# Patient Record
Sex: Female | Born: 1935
Health system: Southern US, Community
[De-identification: ages and names within clinical notes are randomized; demographics above are authoritative.]

## PROBLEM LIST (undated history)

## (undated) DIAGNOSIS — E785 Hyperlipidemia, unspecified: Secondary | ICD-10-CM

## (undated) DIAGNOSIS — T7840XA Allergy, unspecified, initial encounter: Secondary | ICD-10-CM

## (undated) DIAGNOSIS — Z9289 Personal history of other medical treatment: Secondary | ICD-10-CM

## (undated) DIAGNOSIS — Z9889 Other specified postprocedural states: Secondary | ICD-10-CM

## (undated) DIAGNOSIS — M1990 Unspecified osteoarthritis, unspecified site: Secondary | ICD-10-CM

## (undated) DIAGNOSIS — I499 Cardiac arrhythmia, unspecified: Secondary | ICD-10-CM

## (undated) DIAGNOSIS — K635 Polyp of colon: Secondary | ICD-10-CM

## (undated) DIAGNOSIS — R112 Nausea with vomiting, unspecified: Secondary | ICD-10-CM

## (undated) HISTORY — DX: Hyperlipidemia, unspecified: E78.5

## (undated) HISTORY — DX: Allergy, unspecified, initial encounter: T78.40XA

## (undated) HISTORY — PX: TUBAL LIGATION: SHX77

## (undated) HISTORY — DX: Cardiac arrhythmia, unspecified: I49.9

## (undated) HISTORY — PX: TOTAL KNEE ARTHROPLASTY: SHX125

## (undated) HISTORY — DX: Unspecified osteoarthritis, unspecified site: M19.90

## (undated) HISTORY — PX: CARDIAC CATHETERIZATION: SHX172

## (undated) HISTORY — PX: HAND SURGERY: SHX662

## (undated) HISTORY — DX: Polyp of colon: K63.5

---

## 1998-02-05 ENCOUNTER — Ambulatory Visit: Admission: RE | Admit: 1998-02-05 | Discharge: 1998-02-05 | Payer: Self-pay | Admitting: Family Medicine

## 1999-03-18 ENCOUNTER — Other Ambulatory Visit: Admission: RE | Admit: 1999-03-18 | Discharge: 1999-03-18 | Payer: Self-pay | Admitting: Obstetrics and Gynecology

## 1999-07-23 ENCOUNTER — Other Ambulatory Visit: Admission: RE | Admit: 1999-07-23 | Discharge: 1999-07-23 | Payer: Self-pay | Admitting: Internal Medicine

## 2000-07-27 ENCOUNTER — Other Ambulatory Visit: Admission: RE | Admit: 2000-07-27 | Discharge: 2000-07-27 | Payer: Self-pay | Admitting: Obstetrics and Gynecology

## 2001-02-09 ENCOUNTER — Ambulatory Visit (HOSPITAL_COMMUNITY): Admission: RE | Admit: 2001-02-09 | Discharge: 2001-02-09 | Payer: Self-pay | Admitting: Family Medicine

## 2001-02-09 ENCOUNTER — Encounter: Payer: Self-pay | Admitting: Family Medicine

## 2001-05-03 ENCOUNTER — Ambulatory Visit (HOSPITAL_COMMUNITY): Admission: RE | Admit: 2001-05-03 | Discharge: 2001-05-03 | Payer: Self-pay | Admitting: Cardiology

## 2001-08-09 ENCOUNTER — Other Ambulatory Visit: Admission: RE | Admit: 2001-08-09 | Discharge: 2001-08-09 | Payer: Self-pay | Admitting: Obstetrics and Gynecology

## 2002-08-29 ENCOUNTER — Other Ambulatory Visit: Admission: RE | Admit: 2002-08-29 | Discharge: 2002-08-29 | Payer: Self-pay | Admitting: Obstetrics and Gynecology

## 2005-05-19 ENCOUNTER — Other Ambulatory Visit: Admission: RE | Admit: 2005-05-19 | Discharge: 2005-05-19 | Payer: Self-pay | Admitting: Obstetrics and Gynecology

## 2005-09-27 HISTORY — PX: TOTAL HIP ARTHROPLASTY: SHX124

## 2005-09-29 ENCOUNTER — Encounter: Payer: Self-pay | Admitting: Family Medicine

## 2005-11-03 ENCOUNTER — Inpatient Hospital Stay (HOSPITAL_COMMUNITY): Admission: RE | Admit: 2005-11-03 | Discharge: 2005-11-06 | Payer: Self-pay | Admitting: Orthopedic Surgery

## 2006-12-02 ENCOUNTER — Emergency Department: Payer: Self-pay | Admitting: General Practice

## 2007-05-31 ENCOUNTER — Ambulatory Visit: Payer: Self-pay | Admitting: Family Medicine

## 2007-05-31 DIAGNOSIS — J309 Allergic rhinitis, unspecified: Secondary | ICD-10-CM | POA: Insufficient documentation

## 2007-05-31 DIAGNOSIS — M19049 Primary osteoarthritis, unspecified hand: Secondary | ICD-10-CM

## 2007-06-21 ENCOUNTER — Ambulatory Visit: Payer: Self-pay | Admitting: Family Medicine

## 2007-06-23 ENCOUNTER — Encounter (INDEPENDENT_AMBULATORY_CARE_PROVIDER_SITE_OTHER): Payer: Self-pay | Admitting: *Deleted

## 2007-06-23 LAB — CONVERTED CEMR LAB
ALT: 22 units/L (ref 0–35)
AST: 25 units/L (ref 0–37)
Albumin: 3.8 g/dL (ref 3.5–5.2)
Alkaline Phosphatase: 66 units/L (ref 39–117)
BUN: 19 mg/dL (ref 6–23)
Bilirubin, Direct: 0.1 mg/dL (ref 0.0–0.3)
CO2: 30 meq/L (ref 19–32)
Calcium: 9.4 mg/dL (ref 8.4–10.5)
Chloride: 107 meq/L (ref 96–112)
Cholesterol: 196 mg/dL (ref 0–200)
Creatinine, Ser: 0.8 mg/dL (ref 0.4–1.2)
GFR calc Af Amer: 90 mL/min
GFR calc non Af Amer: 75 mL/min
Glucose, Bld: 83 mg/dL (ref 70–99)
HDL: 66.7 mg/dL (ref >39.0)
LDL Cholesterol: 117 mg/dL — ABNORMAL HIGH (ref 0–99)
Potassium: 4.8 meq/L (ref 3.5–5.1)
Sodium: 141 meq/L (ref 135–145)
Total Bilirubin: 1 mg/dL (ref 0.3–1.2)
Total CHOL/HDL Ratio: 2.9
Total Protein: 7.1 g/dL (ref 6.0–8.3)
Triglycerides: 63 mg/dL (ref 0–149)
VLDL: 13 mg/dL (ref 0–40)

## 2007-06-28 DIAGNOSIS — M810 Age-related osteoporosis without current pathological fracture: Secondary | ICD-10-CM | POA: Insufficient documentation

## 2007-07-12 ENCOUNTER — Other Ambulatory Visit: Admission: RE | Admit: 2007-07-12 | Discharge: 2007-07-12 | Payer: Self-pay | Admitting: Obstetrics and Gynecology

## 2007-07-14 ENCOUNTER — Ambulatory Visit: Payer: Self-pay | Admitting: Family Medicine

## 2007-07-26 ENCOUNTER — Encounter: Payer: Self-pay | Admitting: Family Medicine

## 2007-08-04 ENCOUNTER — Encounter: Payer: Self-pay | Admitting: Family Medicine

## 2007-08-16 ENCOUNTER — Encounter: Payer: Self-pay | Admitting: Family Medicine

## 2007-08-16 ENCOUNTER — Ambulatory Visit: Payer: Self-pay | Admitting: Family Medicine

## 2007-11-24 ENCOUNTER — Ambulatory Visit: Payer: Self-pay | Admitting: Family Medicine

## 2007-12-21 ENCOUNTER — Ambulatory Visit: Payer: Self-pay | Admitting: Family Medicine

## 2007-12-21 ENCOUNTER — Telehealth: Payer: Self-pay | Admitting: Family Medicine

## 2008-06-18 ENCOUNTER — Ambulatory Visit: Payer: Self-pay | Admitting: Family Medicine

## 2008-06-20 ENCOUNTER — Encounter (INDEPENDENT_AMBULATORY_CARE_PROVIDER_SITE_OTHER): Payer: Self-pay | Admitting: *Deleted

## 2008-06-21 LAB — CONVERTED CEMR LAB
ALT: 18 units/L (ref 0–35)
AST: 23 units/L (ref 0–37)
Albumin: 3.8 g/dL (ref 3.5–5.2)
Alkaline Phosphatase: 54 units/L (ref 39–117)
BUN: 17 mg/dL (ref 6–23)
Bilirubin, Direct: 0.1 mg/dL (ref 0.0–0.3)
CO2: 27 meq/L (ref 19–32)
Calcium: 9.6 mg/dL (ref 8.4–10.5)
Chloride: 111 meq/L (ref 96–112)
Cholesterol: 221 mg/dL (ref 0–200)
Creatinine, Ser: 0.7 mg/dL (ref 0.4–1.2)
Direct LDL: 144.4 mg/dL
GFR calc Af Amer: 105 mL/min
GFR calc non Af Amer: 87 mL/min
Glucose, Bld: 84 mg/dL (ref 70–99)
HDL: 76.1 mg/dL (ref >39.0)
Potassium: 5.3 meq/L — ABNORMAL HIGH (ref 3.5–5.1)
Sodium: 141 meq/L (ref 135–145)
Total Bilirubin: 1 mg/dL (ref 0.3–1.2)
Total CHOL/HDL Ratio: 2.9
Total Protein: 7 g/dL (ref 6.0–8.3)
Triglycerides: 60 mg/dL (ref 0–149)
VLDL: 12 mg/dL (ref 0–40)

## 2008-07-11 ENCOUNTER — Ambulatory Visit: Payer: Self-pay | Admitting: Family Medicine

## 2008-07-11 DIAGNOSIS — N390 Urinary tract infection, site not specified: Secondary | ICD-10-CM | POA: Insufficient documentation

## 2008-07-11 LAB — CONVERTED CEMR LAB
Nitrite: NEGATIVE
Protein, U semiquant: NEGATIVE
Urobilinogen, UA: NEGATIVE
pH: 6

## 2008-07-26 ENCOUNTER — Ambulatory Visit: Payer: Self-pay | Admitting: Family Medicine

## 2008-07-26 LAB — CONVERTED CEMR LAB
Bilirubin Urine: NEGATIVE
Glucose, Urine, Semiquant: NEGATIVE
pH: 6.5

## 2008-07-27 ENCOUNTER — Encounter: Payer: Self-pay | Admitting: Family Medicine

## 2008-08-01 ENCOUNTER — Ambulatory Visit: Payer: Self-pay | Admitting: Internal Medicine

## 2008-09-12 ENCOUNTER — Ambulatory Visit: Payer: Self-pay | Admitting: Internal Medicine

## 2008-09-25 ENCOUNTER — Ambulatory Visit: Payer: Self-pay | Admitting: Family Medicine

## 2008-09-25 DIAGNOSIS — R252 Cramp and spasm: Secondary | ICD-10-CM | POA: Insufficient documentation

## 2008-09-26 DIAGNOSIS — E538 Deficiency of other specified B group vitamins: Secondary | ICD-10-CM

## 2008-09-26 LAB — CONVERTED CEMR LAB
ALT: 29 units/L (ref 0–35)
Albumin: 3.6 g/dL (ref 3.5–5.2)
BUN: 17 mg/dL (ref 6–23)
CO2: 28 meq/L (ref 19–32)
Calcium: 9.2 mg/dL (ref 8.4–10.5)
Creatinine, Ser: 0.7 mg/dL (ref 0.4–1.2)
Direct LDL: 116.9 mg/dL
Ferritin: 32.8 ng/mL (ref 10.0–291.0)
Folate: 8.6 ng/mL
GFR calc non Af Amer: 87 mL/min
HDL: 73.7 mg/dL (ref >39.0)
Total Bilirubin: 0.8 mg/dL (ref 0.3–1.2)
VLDL: 12 mg/dL (ref 0–40)
Vitamin B-12: 177 pg/mL — ABNORMAL LOW (ref 211–911)

## 2008-10-03 ENCOUNTER — Ambulatory Visit: Payer: Self-pay | Admitting: Internal Medicine

## 2008-10-03 ENCOUNTER — Encounter: Payer: Self-pay | Admitting: Internal Medicine

## 2008-10-04 ENCOUNTER — Encounter: Payer: Self-pay | Admitting: Internal Medicine

## 2008-11-12 ENCOUNTER — Encounter: Payer: Self-pay | Admitting: Family Medicine

## 2008-12-12 ENCOUNTER — Ambulatory Visit: Payer: Self-pay | Admitting: Family Medicine

## 2008-12-12 LAB — CONVERTED CEMR LAB
Bilirubin Urine: NEGATIVE
Protein, U semiquant: NEGATIVE
RBC / HPF: 0
Specific Gravity, Urine: 1.01

## 2009-01-20 ENCOUNTER — Encounter: Payer: Self-pay | Admitting: Family Medicine

## 2009-01-30 ENCOUNTER — Other Ambulatory Visit: Admission: RE | Admit: 2009-01-30 | Discharge: 2009-01-30 | Payer: Self-pay | Admitting: Obstetrics and Gynecology

## 2009-01-30 ENCOUNTER — Encounter: Payer: Self-pay | Admitting: Obstetrics and Gynecology

## 2009-01-30 ENCOUNTER — Ambulatory Visit: Payer: Self-pay | Admitting: Obstetrics and Gynecology

## 2009-03-13 ENCOUNTER — Ambulatory Visit: Payer: Self-pay | Admitting: Family Medicine

## 2009-03-13 LAB — CONVERTED CEMR LAB
Bacteria, UA: 0
Epithelial cells, urine: 0 /lpf
Ketones, urine, test strip: NEGATIVE
Nitrite: NEGATIVE
Specific Gravity, Urine: <1.005
Urobilinogen, UA: 0.2

## 2009-03-14 ENCOUNTER — Encounter (INDEPENDENT_AMBULATORY_CARE_PROVIDER_SITE_OTHER): Payer: Self-pay | Admitting: Internal Medicine

## 2009-08-05 ENCOUNTER — Ambulatory Visit: Payer: Self-pay | Admitting: Family Medicine

## 2009-12-09 ENCOUNTER — Encounter: Payer: Self-pay | Admitting: Family Medicine

## 2009-12-10 ENCOUNTER — Telehealth: Payer: Self-pay | Admitting: Family Medicine

## 2009-12-26 ENCOUNTER — Ambulatory Visit: Payer: Self-pay | Admitting: Family Medicine

## 2009-12-26 LAB — CONVERTED CEMR LAB
ALT: 24 units/L (ref 0–35)
AST: 31 units/L (ref 0–37)
Alkaline Phosphatase: 54 units/L (ref 39–117)
BUN: 18 mg/dL (ref 6–23)
Basophils Absolute: 0 10*3/uL (ref 0.0–0.1)
CO2: 31 meq/L (ref 19–32)
Calcium: 9.3 mg/dL (ref 8.4–10.5)
Eosinophils Absolute: 0.2 10*3/uL (ref 0.0–0.7)
Eosinophils Relative: 3.1 % (ref 0.0–5.0)
GFR calc non Af Amer: 74.04 mL/min (ref >60)
Glucose, Bld: 92 mg/dL (ref 70–99)
LDL Cholesterol: 85 mg/dL (ref 0–99)
MCHC: 34.6 g/dL (ref 30.0–36.0)
MCV: 98.4 fL (ref 78.0–100.0)
Monocytes Absolute: 0.5 10*3/uL (ref 0.1–1.0)
Neutrophils Relative %: 75.1 % (ref 43.0–77.0)
Nitrite: NEGATIVE
Platelets: 249 10*3/uL (ref 150.0–400.0)
RDW: 14.1 % (ref 11.5–14.6)
Sodium: 143 meq/L (ref 135–145)
Total CHOL/HDL Ratio: 2
Triglycerides: 59 mg/dL (ref 0.0–149.0)
Urobilinogen, UA: 0.2
WBC: 5.9 10*3/uL (ref 4.5–10.5)

## 2009-12-27 ENCOUNTER — Encounter: Payer: Self-pay | Admitting: Family Medicine

## 2009-12-28 LAB — CONVERTED CEMR LAB: Vit D, 25-Hydroxy: 84 ng/mL (ref 30–89)

## 2010-01-06 ENCOUNTER — Ambulatory Visit: Payer: Self-pay | Admitting: Family Medicine

## 2010-02-10 ENCOUNTER — Encounter: Payer: Self-pay | Admitting: Family Medicine

## 2010-02-24 ENCOUNTER — Encounter (INDEPENDENT_AMBULATORY_CARE_PROVIDER_SITE_OTHER): Payer: Self-pay | Admitting: *Deleted

## 2010-02-27 ENCOUNTER — Encounter: Payer: Self-pay | Admitting: Family Medicine

## 2010-03-12 ENCOUNTER — Ambulatory Visit: Payer: Self-pay | Admitting: Family Medicine

## 2010-03-12 DIAGNOSIS — L02219 Cutaneous abscess of trunk, unspecified: Secondary | ICD-10-CM

## 2010-03-12 DIAGNOSIS — L03319 Cellulitis of trunk, unspecified: Secondary | ICD-10-CM

## 2010-06-23 ENCOUNTER — Ambulatory Visit: Payer: Self-pay | Admitting: Family Medicine

## 2010-07-07 ENCOUNTER — Ambulatory Visit: Payer: Self-pay | Admitting: Obstetrics and Gynecology

## 2010-10-27 NOTE — Assessment & Plan Note (Signed)
Summary: CPX PER DR. Daphine Deutscher CALL TO PATIENT / LFW   Vital Signs:  Patient profile:   77 year old female Height:      67.25 inches Weight:      137.25 pounds BMI:     21.41 Temp:     97.9 degrees F oral Pulse rate:   64 / minute Pulse rhythm:   regular BP sitting:   110 / 60  (left arm) Cuff size:   regular  Vitals Entered By: Linde Gillis CMA Duncan Dull) (January 06, 2010 8:55 AM) CC: 30 minute exam   History of Present Illness: UTI resolved with antibitoics.   Sees GYN for CPX, breast and pelvic exam.  HRT well controlled on prempro. Encouraged her to discuss weaning off given CAD risk.   Osteopenia.Marland Kitchenvit D nml. On calcium and vit daily. Has forgotten Boniva regularly.   Osteoarthrits.  Well  controlled with diclofenac as needed.  Problems Prior to Update: 1)  Uti  (ICD-599.0) 2)  Other Malaise and Fatigue  (ICD-780.79) 3)  Vitamin B12 Deficiency  (ICD-266.2) 4)  Leg Cramps, Nocturnal  (ICD-729.82) 5)  Other Screening Mammogram  (ICD-V76.12) 6)  Microscopic Hematuria  (ICD-599.72) 7)  Urinary Tract Infection, Recurrent  (ICD-599.0) 8)  Osteopenia  (ICD-733.90) 9)  Allergic Rhinitis  (ICD-477.9) 10)  Family History of Cad Female 1st Degree Relative <50  (ICD-V17.3) 11)  Osteoarthritis  (ICD-715.90) 12)  Hyperlipidemia  (ICD-272.4)  Current Medications (verified): 1)  Prempro 0.45-1.5 Mg Tabs (Conj Estrog-Medroxyprogest Ace) .... Take One Tablet By Mouth Two Time A Week 2)  Simvastatin 40 Mg  Tabs (Simvastatin) .... Take One By Mouth Every Morning 3)  Diclofenac Sodium 75 Mg  Tbec (Diclofenac Sodium) .... Take One By Mouth Two Times A Day As Needed 4)  Fexofenadine Hcl 180 Mg  Tabs (Fexofenadine Hcl) .... Take One By Mouth Once A Day 5)  Calcium 600 Mg With Vitamin D .... Take 2 By Mouth Once A Day 6)  Boniva 150 Mg  Tabs (Ibandronate Sodium) .Marland Kitchen.. 1 Tab By Mouth Monthly  Allergies (verified): No Known Drug Allergies  Past History:  Past medical, surgical, family and  social histories (including risk factors) reviewed, and no changes noted (except as noted below).  Past Medical History: Reviewed history from 05/31/2007 and no changes required. Hyperlipidemia Osteoarthritis Allergic rhinitis  Past Surgical History: Reviewed history from 06/28/2007 and no changes required. cardiac cath 8/02 nml  Family History: Reviewed history from 05/31/2007 and no changes required. father died 24 old age mother died 65 pancreatic cancer 2 brothers MI age 75, CABG Family History of CAD Female 1st degree relative <50 sister breast, bone cancer  Social History: Reviewed history from 05/31/2007 and no changes required. 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 CV:  Denies chest pain or discomfort. Resp:  Denies shortness of breath. GI:  Denies abdominal pain, bloody stools, constipation, and diarrhea. GU:  Denies abnormal vaginal bleeding and dysuria. Derm:  Denies rash. Psych:  Denies anxiety and depression.  Physical Exam  General:  Thin elderly female in NAd Eyes:  No corneal or conjunctival inflammation noted. EOMI. Perrla.  Vision grossly normal. Ears:  External ear exam shows no significant lesions or deformities.  Otoscopic examination reveals clear canals, tympanic membranes are intact bilaterally without bulging, retraction,  inflammation or discharge. Hearing is grossly normal bilaterally. Nose:  External nasal examination shows no deformity or inflammation. Nasal mucosa are pink and moist without lesions or exudates. Mouth:  MMM Neck:  no carotid bruit or thyromegaly no cervical or supraclavicular lymphadenopathy  Breasts:  per GYN Lungs:  Normal respiratory effort, chest expands symmetrically. Lungs are clear to auscultation, no  crackles or wheezes. Heart:  Normal rate and regular rhythm. S1 and S2 normal without gallop, murmur, click, rub or other extra sounds. Abdomen:  Bowel sounds positive,abdomen soft and non-tender without masses, organomegaly or hernias noted. Genitalia:  per GYN Msk:  No deformity or scoliosis noted of thoracic or lumbar spine.   Pulses:  R and L posterior tibial pulses are full and equal bilaterally  Extremities:  no edema  Neurologic:  No cranial nerve deficits noted. Station and gait are normal.  Sensory, motor and coordinative functions appear intact. Skin:  Multiple SKs, no supicious lesions Psych:  Cognition and judgment appear intact. Alert and cooperative with normal attention span and concentration. No apparent delusions, illusions, hallucinations   Impression & Recommendations:  Problem # 1:  VITAMIN B12 DEFICIENCY (ICD-266.2) Resolved.   Problem # 2:  LEG CRAMPS, NOCTURNAL (ICD-729.82) Improved...use flexeril and diclofenac as needed. No clear symptoms of restless leg.   Problem # 3:  OSTEOPENIA (ICD-733.90) Due for DXA. Vit in nml range. Continue ca and vit D supplement. Encouraged her to take Boniva regularly.  Her updated medication list for this problem includes:    Boniva 150 Mg Tabs (Ibandronate sodium) .Marland Kitchen... 1 tab by mouth monthly  Orders: Radiology Referral (Radiology)  Problem # 4:  HYPERLIPIDEMIA (ICD-272.4)  Well controlled. Continue current medication.  Her updated medication list for this problem includes:    Simvastatin 40 Mg Tabs (Simvastatin) .Marland Kitchen... Take one by mouth every morning  Labs Reviewed: SGOT: 31 (12/26/2009)   SGPT: 24 (12/26/2009)   HDL:64.60 (12/26/2009), 73.7 (09/25/2008)  LDL:85 (12/26/2009), DEL (16/06/9603)  Chol:161 (12/26/2009), 205 (09/25/2008)  Trig:59.0 (12/26/2009), 61 (09/25/2008)  Complete Medication List: 1)  Prempro 0.45-1.5 Mg Tabs (Conj estrog-medroxyprogest ace) .... Take one tablet by mouth two time a week 2)  Simvastatin  40 Mg Tabs (Simvastatin) .... Take one by mouth every morning 3)  Diclofenac Sodium 75 Mg Tbec (Diclofenac sodium) .... Take one by mouth two times a day as needed 4)  Fexofenadine Hcl 180 Mg Tabs (Fexofenadine hcl) .... Take one by mouth once a day 5)  Calcium 600 Mg With Vitamin D  .... Take 2 by mouth once a day 6)  Boniva 150 Mg Tabs (Ibandronate sodium) .Marland Kitchen.. 1 tab by mouth monthly  Patient Instructions: 1)  Referral Appointment Information 2)  Day/Date: 3)  Time: 4)  Place/MD: 5)  Address: 6)  Phone/Fax: 7)  Patient given appointment information. Information/Orders faxed/mailed.  8)  Please schedule a follow-up appointment in 1 year.  Prescriptions: BONIVA 150 MG  TABS (IBANDRONATE SODIUM) 1 tab by mouth monthly  #3 x 3   Entered and Authorized by:   Kerby Nora MD   Signed by:   Kerby Nora MD on 01/06/2010   Method used:   Electronically to        [id number]       [address]       [place], [place]  [address]       Ph: [phone]       Fax: [phone]   RxID:   [id number]   Current  Allergies (reviewed today): No known allergies   Last Flu Vaccine:  Fluvax 3+ (08/05/2009 1:55:13 PM) Flu Vaccine Next Due:  1 yr Flex Sig Next Due:  Not Indicated Hemoccult Next Due:  Not Indicated GYN Dr. Dortha Schwalbe..has upcoming appt for DVE

## 2010-10-27 NOTE — Assessment & Plan Note (Signed)
Summary: flu shot/bedsole/dlo  Nurse Visit   Allergies: No Known Drug Allergies  Immunizations Administered:  Influenza Vaccine # 1:    Vaccine Type: Fluvax MCR    Site: left deltoid    Mfr: GlaxoSmithKline    Dose: 0.5 ml    Route: IM    Given by: Mervin Hack CMA (AAMA)    Exp. Date: 03/27/2011    Lot #: [id number]    VIS given: 04/21/10 version given June 23, 2010.  Flu Vaccine Consent Questions:    Do you have a history of severe allergic reactions to this vaccine? no    Any prior history of allergic reactions to egg and/or gelatin? no    Do you have a sensitivity to the preservative Thimersol? no    Do you have a past history of Guillan-Barre Syndrome? no    Do you currently have an acute febrile illness? no    Have you ever had a severe reaction to latex? no    Vaccine information given and explained to patient? yes    Are you currently pregnant? no  Orders Added: 1)  Influenza Vaccine MCR [00025]

## 2010-10-27 NOTE — Letter (Signed)
Summary: Results Follow up Letter  [place] [facility]  [address] [place], [place] [address]   Phone: [phone]  Fax: [phone]    02/24/2010 MRN: [id number]     Kathryn [name] [address] [place], [place]  [address]    Dear [name],  The following are the results of your recent test(s):  Test         Result    Pap Smear:        Normal _____  Not Normal _____ Comments: ______________________________________________________ Cholesterol: LDL(Bad cholesterol):         Your goal is less than:         HDL (Good cholesterol):       Your goal is more than: Comments:  ______________________________________________________ Mammogram:        Normal _____  Not Normal _____ Comments:  ___________________________________________________________________ Hemoccult:        Normal _____  Not normal _______ Comments:    _____________________________________________________________________ Other Tests:BONE DENSITY TEST:Notify pt that bone density is stable on biniva, no worsening. Recheck in 2 years    We routinely do not discuss normal results over the telephone.  If you desire a copy of the results, or you have any questions about this information we can discuss them at your next office visit.   Sincerely,   Kerby Nora MD

## 2010-10-27 NOTE — Letter (Signed)
Summary: Handicapped Placard/[facility]  Handicapped Placard/[facility]   Imported By: Lanelle Bal 03/05/2010 10:06:53  _____________________________________________________________________  External Attachment:    Type:   Image     Comment:   External Document

## 2010-10-27 NOTE — Progress Notes (Signed)
Summary: wants order for bone density, wants labs  Phone Note Call from Patient Call back at Home Phone [phone] Call back at Work Phone [phone]   Caller: Patient Call For: Kerby Nora MD Summary of Call: Pt got a reminder from [facility] that she is past due for a bone density test- she is requesting an order.  Also, she is asking if she can have lipids checked, last checked in 12/09.  Please advise. Initial call taken by: Lowella Petties CMA,  December 10, 2009 11:03 AM  Follow-up for Phone Call        Overdue for CPX.Marland Kitchenplease schedule with fasting lipids prior Dx 272.0 CMET, lipids, vit b12, vit D, TSH, cbc Dx 780.79  Will schedule mammogram and other procedures as needed at CPX appt.  Follow-up by: Kerby Nora MD,  December 10, 2009 1:57 PM  Additional Follow-up for Phone Call Additional follow up Details #1::        Spoke to patient and she says that she will have to check her calander at work and call back and schedule labs and physical Additional Follow-up by: Benny Lennert CMA Duncan Dull),  December 11, 2009 8:33 AM

## 2010-10-27 NOTE — Assessment & Plan Note (Signed)
Summary: CHECK PLACE ON STOMACH/CLE   Vital Signs:  Patient profile:   77 year old female Height:      67.25 inches Weight:      137.4 pounds BMI:     21.44 Temp:     97.9 degrees F oral Pulse rate:   64 / minute Pulse rhythm:   regular BP sitting:   102 / 78  (left arm) Cuff size:   regular  Vitals Entered By: Benny Lennert CMA Duncan Dull) (March 12, 2010 3:23 PM)  History of Present Illness: Chief complaint check place on stomach  Redness and soreness on stomach in last few weeks. Right lateral to umbilicus.  Applying neosprin ointment  Has seen yellowish discharge...yesterday firm white nodule came out..now less tender but small hole remians.   In past 5-6 years had cellulitis in same area and has always had firm nodule there since.   Allergies (verified): No Known Drug Allergies  Past History:  Past medical, surgical, family and social histories (including risk factors) reviewed, and no changes noted (except as noted below).  Past Medical History: Reviewed history from 05/31/2007 and no changes required. Hyperlipidemia Osteoarthritis Allergic rhinitis  Past Surgical History: Reviewed history from 06/28/2007 and no changes required. cardiac cath 8/02 nml  Family History: Reviewed history from 05/31/2007 and no changes required. father died 16 old age mother died 48 pancreatic cancer 2 brothers MI age 64, CABG Family History of CAD Female 1st degree relative <50 sister breast, bone cancer  Social History: Reviewed history from 05/31/2007 and no changes required. 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 CV:  Denies chest pain or discomfort. Resp:  Denies shortness of breath.  Physical Exam  General:  Well-developed,well-nourished,in no  acute distress; alert,appropriate and cooperative throughout examination Mouth:  Oral mucosa and oropharynx without lesions or exudates.  Teeth in good repair. Neck:  no carotid bruit or thyromegaly  no cervical or supraclavicular lymphadenopathy  Lungs:  Normal respiratory effort, chest expands symmetrically. Lungs are clear to auscultation, no crackles or wheezes. Heart:  Normal rate and regular rhythm. S1 and S2 normal without gallop, murmur, click, rub or other extra sounds. Abdomen:  Bowel sounds positive,abdomen soft and non-tender without masses, organomegaly or hernias noted. Skin:  0.3 cm hole to right lateral of umbilicus..with 2 cm surrounding erythema, yellowwhite discharge, no fluctuance.  probed with Q-tip .Marland Kitchennofistula   Impression & Recommendations:  Problem # 1:  CELLULITIS, ABDOMEN (ICD-682.2)  Pus and possible cyst discharged...treat with oral antibiotics. Low risk for MRSA.Marland Kitchenstarted before pts husband in hospital.  Warm soapy water..wash daily.  Wear bandaid with antiobiotic gel.  Her updated medication list for this problem includes:    Keflex 500 Mg Caps (Cephalexin) .Marland Kitchen... 1 tab by mouth three times a day x 7 days  . To be seen in 48-72 hours if no improvement, sooner if worse.  Complete Medication List: 1)  Prempro 0.45-1.5 Mg Tabs (Conj estrog-medroxyprogest ace) .... Take one tablet by mouth two time a week 2)  Simvastatin 40 Mg Tabs (Simvastatin) .... Take one by mouth every morning 3)  Diclofenac Sodium 75 Mg Tbec (Diclofenac sodium) .... Take one by mouth two times a day as needed 4)  Fexofenadine Hcl 180 Mg Tabs (Fexofenadine hcl) .... Take one by mouth once a day  5)  Calcium 600 Mg With Vitamin D  .... Take 2 by mouth once a day 6)  Boniva 150 Mg Tabs (Ibandronate sodium) .Marland Kitchen.. 1 tab by mouth monthly 7)  Keflex 500 Mg Caps (Cephalexin) .Marland Kitchen.. 1 tab by mouth three times a day x 7 days  Patient Instructions: 1)  Warm soapy water..wash daily. 2)   Wear bandaid  with antiobiotic gel. 3)  Start antibiotics.  4)  Stop peroxide.  5)  Call if redness spreading or fever or not resolving.  Prescriptions: KEFLEX 500 MG CAPS (CEPHALEXIN) 1 tab by mouth three times a day x 7 days  #21 x 0   Entered and Authorized by:   Kerby Nora MD   Signed by:   Kerby Nora MD on 03/12/2010   Method used:   [facility] to        [id number]       [address]       [place], [place]  [address]       Ph: [phone]       Fax: [phone]   RxID:   [id number]   Current Allergies (reviewed today): No known allergies

## 2010-10-27 NOTE — Assessment & Plan Note (Signed)
Summary: ?UTI/CLE   Vital Signs:  Patient profile:   77 year old female Height:      67.25 inches Weight:      137.38 pounds BMI:     21.43 Temp:     98.0 degrees F oral Pulse rate:   64 / minute Pulse rhythm:   regular BP sitting:   112 / 78  (left arm) Cuff size:   regular  Vitals Entered By: Linde Gillis CMA Duncan Dull) (December 26, 2009 10:40 AM)   Acute Visit History:      The patient complains of genitourinary symptoms.  These symptoms began 4 days ago.  She notes a history of dysuria, frequency, and hesitancy.  She denies abdominal pain, diarrhea, fever, flank pain, hematuria, incontinence, nausea, perineal itch, or perineal lesions.  The patient has no history of pyelonephritis, kidney stones, a renal anomaly, renal disease, or diabetes.        Allergies (verified): No Known Drug Allergies  Past History:  Past medical, surgical, family and social histories (including risk factors) reviewed, and no changes noted (except as noted below).  Past Medical History: Reviewed history from 05/31/2007 and no changes required. Hyperlipidemia Osteoarthritis Allergic rhinitis  Past Surgical History: Reviewed history from 06/28/2007 and no changes required. cardiac cath 8/02 nml  Family History: Reviewed history from 05/31/2007 and no changes required. father died 33 old age mother died 85 pancreatic cancer 2 brothers MI age 17, CABG Family History of CAD Female 1st degree relative <50 sister breast, bone cancer  Social History: Reviewed history from 05/31/2007 and no changes required. 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 Lungs are clear to auscultation, no crackles or wheezes. Heart:  Normal rate and regular rhythm. S1 and S2 normal without gallop, murmur, click, rub or other extra sounds. Abdomen:  Bowel sounds positive,abdomen soft and non-tender without masses, organomegaly or hernias noted. No CVA tenderness.    Impression & Recommendations:  Problem # 1:  UTI (ICD-599.0)  Given 3rd UTI in last 9 months..will send culture for resistance. No s/s of pyelonephritis.  Increase water, cranberry. Call if not imrpoving as expected.  The following medications were removed from the medication list:    Macrobid 100 Mg Caps (Nitrofurantoin monohyd macro) .Marland Kitchen... Take 1 two times a day for 7days Her updated medication list for this problem includes:    Cipro 250 Mg Tabs (Ciprofloxacin hcl) .Marland Kitchen... Take one (1) by mouth twice a day  Orders: UA Dipstick W/ Micro (manual) (04540) T-Culture, Urine ([id number])  Complete Medication List: 1)  Prempro 0.625-2.5 Mg Tabs (Conj estrog-medroxyprogest ace) .... Take one by mouth two times a week 2)  Simvastatin 40 Mg Tabs (Simvastatin) .... Take one by mouth every morning 3)  Diclofenac Sodium 75 Mg Tbec (Diclofenac sodium) .... Take one by mouth two times a day as needed 4)  Fexofenadine Hcl 180 Mg Tabs (Fexofenadine hcl) .... Take one by mouth once a day 5)  Calcium 600 Mg With Vitamin D  .... Take 2 by mouth once a day 6)  Boniva 150 Mg Tabs (Ibandronate sodium) .Marland Kitchen.. 1 tab by mouth monthly 7)  Flexeril 5 Mg Tabs (Cyclobenzaprine hcl) .Marland Kitchen.. 1 tab by mouth  at bedtime as needed leg cramps 8)  Cipro 250 Mg Tabs (Ciprofloxacin hcl) .... Take one (1) by mouth twice a day  Patient Instructions: 1)  Drink plenty of fluids up to 3-4 quarts a day. Cranberry juice is especially recommended in addition to large amounts of water. Avoid caffeine & carbonated drinks, they tend to irritate the bladder, Return in 3-5 days if you're not better: sooner if you're  feeling worse.  Prescriptions: CIPRO 250 MG TABS (CIPROFLOXACIN HCL) Take one (1) by mouth twice a day  #14 x 0   Entered and Authorized by:   Kerby Nora MD   Signed by:   Kerby Nora MD on 12/26/2009   Method used:   Electronically to        [id number]       [address]       [place], [place]  [address]       Ph: [phone]       Fax: [phone]   RxID:   [id number]   Current Allergies (reviewed today): No known allergies      Laboratory Results   Urine Tests  Date/Time Received: December 26, 2009 11:16 AM   Routine Urinalysis   Color: lt. yellow Appearance: Cloudy Glucose: negative   (Normal Range: Negative) Bilirubin: negative   (Normal Range: Negative) Ketone: negative   (Normal Range: Negative) Spec. Gravity: <1.005   (Normal Range: 1.003-1.035) Blood: moderate   (Normal Range: Negative) pH: 5.0   (Normal Range: 5.0-8.0) Protein: trace   (Normal Range: Negative) Urobilinogen: 0.2   (Normal Range: 0-1) Nitrite: negative   (Normal Range: Negative) Leukocyte Esterace: moderate   (Normal Range: Negative)

## 2011-01-04 ENCOUNTER — Other Ambulatory Visit: Payer: Self-pay | Admitting: Family Medicine

## 2011-01-19 ENCOUNTER — Ambulatory Visit: Payer: Self-pay | Admitting: Family Medicine

## 2011-01-28 ENCOUNTER — Encounter: Payer: Self-pay | Admitting: Family Medicine

## 2011-02-02 ENCOUNTER — Ambulatory Visit (INDEPENDENT_AMBULATORY_CARE_PROVIDER_SITE_OTHER): Payer: Medicare Other | Admitting: Family Medicine

## 2011-02-02 ENCOUNTER — Encounter: Payer: Self-pay | Admitting: Family Medicine

## 2011-02-02 DIAGNOSIS — Z01818 Encounter for other preprocedural examination: Secondary | ICD-10-CM | POA: Insufficient documentation

## 2011-02-02 NOTE — Assessment & Plan Note (Signed)
Moderate risk surgery in low risk individual. She should tolerate the total knee replacement well.  Needs labs and EKG prior to surgery, no other testing indicated.  If issues in hospital .. Call [facility] for inpatient coverage.

## 2011-02-02 NOTE — Progress Notes (Signed)
  Subjective:    Patient ID: Kathryn [name], female    DOB: [birth year], 77 y.o.   MRN: [id number]  HPI  77 year old female here for pre op eval prior to right knee replacement. Dr. Berton Lan is the surgeon. Scheduled for 03/01/2011.  She is to have preop eval with anethesiology at hospital prior including labs, EKG. She will be intubated for the surgery. Recovery time is 3 months.  She is feeling well overall.. Only ongoing issue is right knee pain.  Sees GYN for CPX, breast and pelvic exam. HRT well controlled on prempro.  DXA due next year. COlonoscopy up to date.  Mammogram normla on 01/28/2011  Vaccines up to date.      Review of Systems  Constitutional: Negative for fever and fatigue.  HENT: Negative for ear pain, congestion, rhinorrhea, sneezing and postnasal drip.   Eyes: Negative for pain.  Respiratory: Negative for shortness of breath and wheezing.   Cardiovascular: Negative for chest pain, palpitations and leg swelling.  Gastrointestinal: Negative for diarrhea, constipation, blood in stool and abdominal distention.  Genitourinary: Negative for dysuria and hematuria.  Musculoskeletal: Positive for arthralgias.  Neurological: Negative for syncope.       No recent falls or passing out.       Objective:   Physical Exam  Constitutional: Vital signs are normal. She appears well-developed and well-nourished. She is cooperative.  Non-toxic appearance. She does not appear ill. No distress.  HENT:  Head: Normocephalic.  Right Ear: Hearing, tympanic membrane, external ear and ear canal normal.  Left Ear: Hearing, tympanic membrane, external ear and ear canal normal.  Nose: Nose normal.  Eyes: Conjunctivae, EOM and lids are normal. Pupils are equal, round, and reactive to light. No foreign bodies found.  Neck: Trachea normal and normal range of motion. Neck supple. Carotid bruit is not present. No mass and no thyromegaly present.  Cardiovascular: Normal rate, regular rhythm, S1  normal, S2 normal, normal heart sounds and intact distal pulses.  Exam reveals no gallop.   No murmur heard. Pulmonary/Chest: Effort normal and breath sounds normal. No respiratory distress. She has no wheezes. She has no rhonchi. She has no rales.  Abdominal: Soft. Normal appearance and bowel sounds are normal. She exhibits no distension, no fluid wave, no abdominal bruit and no mass. There is no hepatosplenomegaly. There is no tenderness. There is no rebound, no guarding and no CVA tenderness. No hernia.  Musculoskeletal:       Right knee: She exhibits deformity. She exhibits no erythema. tenderness found.  Lymphadenopathy:    She has no cervical adenopathy.    She has no axillary adenopathy.  Neurological: She is alert. She has normal strength. No cranial nerve deficit or sensory deficit.  Skin: Skin is warm, dry and intact. No rash noted.  Psychiatric: Her speech is normal and behavior is normal. Judgment normal. Her mood appears not anxious. Cognition and memory are normal. She does not exhibit a depressed mood.          Assessment & Plan:

## 2011-02-02 NOTE — Patient Instructions (Signed)
Call insurance to see if they cover shingles vaccine.

## 2011-02-12 NOTE — Discharge Summary (Signed)
Kathryn [name], [place]                 ACCOUNT NO.:  [id number]   MEDICAL RECORD NO.:  [id number]          PATIENT TYPE:  INP   LOCATION:  1503                         FACILITY:  [facility]   PHYSICIAN:  Ollen Gross, M.D.    DATE OF BIRTH:  [birth year]   DATE OF ADMISSION:  11/03/2005  DATE OF DISCHARGE:  11/06/2005                                 DISCHARGE SUMMARY   ADMISSION DIAGNOSES:  1.  Osteoarthritis, left hip.  2.  Hypercholesterolemia.  3.  History of urinary tract infection.  4.  Osteoporosis.  5.  Postmenopausal.   DISCHARGE DIAGNOSES:  1.  Osteoarthritis, left hip, status post left total hip arthroplasty.  2.  Postoperative blood loss anemia.  3.  Postoperative hypokalemia, improved.  4.  Hypercholesterolemia.  5.  History of urinary tract infection.  6.  Osteoporosis.  7.  Postmenopausal.   PROCEDURE:  On November 03, 2005, left total hip.   SURGEON:  Ollen Gross, M.D.   ASSISTANT:  Alexzandrew L. Perkins, P.A.-C.   ANESTHESIA:  General.   CONSULTS:  None.   BRIEF HISTORY:  Ms. Cowell is a 77 year old female with end-stage arthritis  of the left hip, protrusio deformity, intractable pain, now presents for a  total hip arthroplasty.   LABORATORY DATA:  CBC on admission showed a hemoglobin of 12.1 and  hematocrit of 36.6.  White cell count normal at 6.1.  Postop hemoglobin 9,  drifted down to 8.5.  Last noted H&H was 8.4 and 24.7.  PT/PTT 12.1 and 30,  respectively.  INR 0.9.  Serial pro times followed.  Last noted PT/INR 18  and 1.5.  Chem panel on admission all within normal limits with the  exception of an elevated potassium at 5.2.  Follow-up BMETs were noted.  Potassium dropped to a normal of 4.1, then drifted down to 3.3.  Placed on  potassium supplement.  Back up to 4.3.  Admitting BMET was within normal  limits.  Preop UA negative.  Blood group type O+.   EKG dated October 28, 2005, normal sinus rhythm.  Normal EKG.  Confirmed by  Dr. Lady Deutscher.   Chest x-ray, preop, October 28, 2005:  Hyperinflation without acute  superimposed abnormality.   Left hip films, bilateral hip arthritis, left greater than right.   HOSPITAL COURSE:  The patient was admitted to [facility],  tolerated the procedure well.  Later was sent to the recovery room and to  the orthopedic floor.  Continued postoperative care.  Started on PCA and  p.o. analgesics.  Had a fairly decent night following surgery.  Started  getting up out of bed, though, and had a little bit of nausea, which was  improved with antiemetics.  Had a little asymptomatic hypotension; however,  she did have some nausea with change of position.  Orthostatics were  checked.  She was given a fluid bolus.  Her pressure improved.  It was back  up by the following day.  Felt much better on day #2, just some stiffness.  Started getting up with physical therapy.  Ambulating approximately about 60  feet.  The dressing was changed.  The incision looks good.  PCA and fluids  were discontinued.  Did have a drop in her potassium down to 3.3.  She was  placed on potassium supplements.  Responded well.  Potassium came back up.  Discharge planning assisted with post-hospital needs.  She continued to do  well with physical therapy and ambulating greater than 220 feet later that  afternoon.  She did so well that she was ready to go home by the following  day on November 06, 2005.  She was seen in rounds and discharged home.   DISCHARGE PLAN:  1.  Patient was discharged home on October 29, 2005.  2.  Discharge diagnoses:  Please see above.  3.  Discharge meds:  Coumadin, Percocet, Robaxin, and Trinsicon.  4.  Diet:  As tolerated.  Resume previous home diet.  5.  Follow up two weeks from surgery.  Contact the office for a follow-up      appointment.  6.  Activity:  She is partial weightbearing 25-50% to the left lower      extremity.  Home health PT/OT and home health nursing with gait training       ambulation, ADLs, and total hip.   DISPOSITION:  Home.   CONDITION ON DISCHARGE:  Improved.      Alexzandrew L. Julien Girt, P.A.      Ollen Gross, M.D.  Electronically Signed    ALP/MEDQ  D:  11/29/2005  T:  11/30/2005  Job:  [id number]   cc:   Dr. Tiburcio Pea

## 2011-02-12 NOTE — Cardiovascular Report (Signed)
[place]  Patient:    Kathryn [name], Kathryn [name]                          MRN: [id number] Proc. Date: 05/03/01 Adm. Date:  [id number] Disc. Date: [id number] Attending:  Armanda Magic CC:         Meredith Staggers, M.D.   Cardiac Catheterization  PROCEDURES PERFORMED:  Left heart catheterization, coronary angiography, and left ventriculography.  OPERATOR:  Armanda Magic, M.D.  INDICATIONS:  Chest.  COMPLICATIONS:  None.  IV ACCESS:  Via right femoral artery, 6-French sheath.  HISTORY:  This is a 77 year old white female with a strong family history of coronary disease and a history of hyperlipidemia, who presents with chest pain consistent with angina.  She now presents for cardiac catheterization.  DESCRIPTION OF PROCEDURE:  The patient was brought to the cardiac catheterization laboratory in the fasting nonsedated state.  Informed consent was obtained.  The patient was connected to continuous heart rate and pulse oximetry monitoring with intermittent blood pressure monitoring.  The right groin was prepped and draped in a sterile fashion.  One percent Xylocaine was used for local anesthesia.  Using the modified Seldinger technique, a 6-French sheath was placed in the right femoral artery.  Under fluoroscopic guidance, a 6-French JL-4 catheter was attempted to be placed into the left coronary artery, but was unsuccessful and this was exchanged out over a guide wire for a 6-French JL-3.5 catheter which again was unsuccessful in engaging the left coronary artery.  This catheter was then exchanged out for a 6-French JL-5 catheter, which was successfully engaged into the left coronary artery.  Multiple cine films were taken in the RAO 30 degree and LAO 40 degree views.  This catheter was then exchanged out over a guide wire for a 6-French JR-4 catheter, which was placed in the right coronary artery.  Multiple cine films were taken in the RAO 30 degree and  LAO 40 degree views.  This catheter was then exchanged out for a 6-French angled pigtail catheter which was placed in the left ventricular cavity.  Left ventriculography was performed in the 30 degree RAO view.  The catheter was then pulled back across the aortic valve with no significant pressure gradient.  The catheter was then removed over a guide wire.  At the end of the procedure, all catheters and sheaths were removed.  Manual compression was performed until adequate hemostasis was obtained.  The patient was transferred back to the room in stable condition.  RESULTS:  The left main coronary artery is very short and quickly bifurcates into a left anterior descending artery and left circumflex artery with either a ramus branch or a very high diagonal branch.  The left anterior descending artery is widely patent throughout its proximal portion.  It gives rise to a very large branching diagonal which is widely patent.  Directly after the takeoff of the diagonal branch, there appears to be some narrowing of the LAD with systole, consistent with systolic compression.  The rest of the LAD is patent throughout its course.  The ramus or high diagonal branch is widely patent.  The left circumflex is widely patent throughout its course and gives rise to a first obtuse marginal branch which is widely patent and then bifurcates into two ______ artery branches distally, which are widely patent.  The right coronary artery is widely patent throughout its course.  It gives rise to one acute marginal  branch and then bifurcates into a posterior descending artery and a posterolateral artery.  Left ventriculography performed in the 30 degree RAO view using a total of 24 cc of contrast at 12 cc/sec showed normal left ventricular size and systolic function.  Ejection fraction was 65%.  Left ventricular pressure was 148/6 mmHg.  Aortic pressure was 147/73 mmHg.  ASSESSMENT: 1. Noncardiac chest  pain. 2. Normal coronary arteries with possible systolic compression of mid    left anterior descending artery. 3. Normal left ventricular function.  PLAN:  Discharge home later today.  Followup with Dr. Dayton Scrape. DD:  05/03/01 TD:  05/03/01 Job: [id number]

## 2011-02-12 NOTE — Op Note (Signed)
[name], [name]                 ACCOUNT NO.:  [id number]   MEDICAL RECORD NO.:  [id number]          PATIENT TYPE:  INP   LOCATION:  NA                           FACILITY:  [facility]   PHYSICIAN:  Ollen Gross, M.D.    DATE OF BIRTH:  1931-02-10   DATE OF PROCEDURE:  11/03/2005  DATE OF DISCHARGE:                                 OPERATIVE REPORT   PREOPERATIVE DIAGNOSIS:  Osteoarthritis, left hip.   POSTOPERATIVE DIAGNOSIS:  Osteoarthritis, left hip.   PROCEDURE:  Left total hip arthroplasty.   SURGEON:  Ollen Gross, M.D.   ASSISTANT:  Avel Peace, PA-C.   ANESTHESIA:  General.   ESTIMATED BLOOD LOSS:  300.   DRAINS:  Hemovac x1.   COMPLICATIONS:  None.   CONDITION:  Stable to recovery.   BRIEF CLINICAL NOTE:  Kathryn [name] is a 77 year old female with end-stage  osteoarthritis of the left hip with protrusio deformity. She has intractable  pain and presents for total hip arthroplasty.   PROCEDURE IN DETAIL:  After successful administration of general anesthetic,  the patient's placed in the right lateral decubitus position with the left  side up and held with the hip positioner. The left lower extremity is  isolated from her perineum with plastic drapes and prepped and draped in the  usual sterile fashion. A short posterolateral incision is made with a 10  blade through the subcutaneous tissue to the level of the fascia lata which  is incised in line with the skin incision. The sciatic nerve was palpated  and protected and the short rotators isolated off the femur. Capsulectomy  was performed and the hip dislocated. The center of the femoral head is  marked and a trial prosthesis placed such that the center of the trial head  corresponds to the center of her native femoral head. Osteotomy line is  marked on the femoral neck and osteotomy made with an oscillating saw. The  femur was then retracted anteriorly to gain acetabular exposure. Acetabular  labrum and osteophytes  are removed. Reaming starts at 47 mm coursing in  increments of 2 up to 55 mm and a 56 mm pinnacle acetabular shell was placed  in anatomic position and transfixed with two dome screws with excellent  purchase. A trial 32 mm neutral +4 liner was placed. Any osteophyte hanging  over the liner is removed.   The femur is prepared with the canal finder and irrigation. Axial reaming is  performed up to 15.5 mm, proximal reaming to a 30F and the sleeve machined  to a large. The 30F large sleeve is placed with a  20 x 15 stem and a 36  plus 8 neck. We went about 10 degrees beyond her native anteversion. A 32  plus zero head is placed. Upon reduction, I noticed that there was not  enough offset so we went to a 36 plus 12 which effectively tightened up the  abductor. With a 36 plus 0 head, she still had a tiny bit of laxity and the  leg was short when placed up against the right side.  We went to a 36 plus 3  which tightened up her tissues better. She had great stability with full  extension, full external rotation, 70 degrees flexion, 40 degrees adduction,  90 degrees internal rotation and 90 degrees of flexion, 70 degrees of  internal rotation. By placing the left leg on top of the right, it was felt  as though the leg lengths were now equal.   All trials were removed and the permanent apex hole eliminator was placed  into the acetabular shell. The permanent 32 mm neutral plus 4 marathon liner  was placed into the acetabular shell. On the femoral side, we placed a 32F  large sleeve with a 20 x 15 stem and 36 plus 12 neck with the neck about 10  degrees beyond her native anteversion. A 36 plus 3 head is placed and the  hip is reduced with the same stability parameters. The wound was copiously  irrigated with saline solution and the short rotators reattached to the  femur through drill holes. The fascia lata was closed over a Hemovac drain  with interrupted #1 Vicryl, subcu closed with #1 and 2-0  Vicryl and  subcuticular with running 4-0 Monocryl. The incision is cleaned and dried  and Steri-Strips and a bulky sterile dressing applied. The drain is hooked  to suction and she is placed into a knee immobilizer, awakened, and  transported to recovery in stable condition.      Ollen Gross, M.D.  Electronically Signed     FA/MEDQ  D:  11/03/2005  T:  11/03/2005  Job:  [id number]

## 2011-02-12 NOTE — H&P (Signed)
NAMELETHER, TESCH                 ACCOUNT NO.:  [id number]   MEDICAL RECORD NO.:  [id number]          PATIENT TYPE:  INP   LOCATION:  1503                         FACILITY:  [facility]   PHYSICIAN:  Ollen Gross, M.D.    DATE OF BIRTH:  26-Aug-1931   DATE OF ADMISSION:  11/03/2005  DATE OF DISCHARGE:                                HISTORY & PHYSICAL   CHIEF COMPLAINT:  Left hip pain.   HISTORY OF PRESENT ILLNESS:  The patient is a 77 year old female who has  been seen by Dr. Lequita Halt for ongoing hip pain. The pain has been ongoing in  the left hip for quite some time now. It is in her groin and travels down  her thigh. She denies any back pain. Denies any lower extremity weakness or  paraesthesia. It has progressively gotten worse over time. She is seen in  the office where x-ray show bone-on-bone changes throughout the left hip  with some protrusio deformity. It has progressively gotten worse over the  past couple of years. She is not able to tie her shoes and has difficulty  getting up and down. This is interfering with her sleep. She is at a stage  now where she would like to have something done about it. The risks and  benefits have been discussed and the patient was subsequently admitted to  the hospital.   ALLERGIES:  No known drug allergies.   CURRENT MEDICATIONS:  Voltaren, Zocor, calcium, glucosamine, Prempro,  Fosamax, occasional Tylenol.   PAST MEDICAL HISTORY:  1.  Hypercholesterolemia.  2.  History of urinary tract infection.  3.  Osteoporosis.  4.  Postmenopausal.   PAST SURGICAL HISTORY:  Nasal surgery.   FAMILY HISTORY:  Brother with a history of heart disease. Sister with a  history of arthritis.   SOCIAL HISTORY:  Married. Sales associate. Nonsmoker. Occasional wine. One  son. Husband and son will be assisting with the care after surgery.   REVIEW OF SYSTEMS:  GENERAL: No fever, chills or night sweats. NEUROLOGICAL:  No seizures, syncope or paralysis.  RESPIRATORY: No shortness of breath,  productive cough, or hemoptysis. CARDIOVASCULAR: No chest pain, angina, or  orthopnea. GI: No nausea, vomiting, diarrhea, or constipation. GU: No  dysuria, hematuria or discharge. MUSCULOSKELETAL: Left hip.   PHYSICAL EXAMINATION:  VITAL SIGNS: Pulse 68, respiration 12, blood pressure  128/78.  GENERAL: A 77 year old white female thin-framed in no acute distress. She is  alert, oriented, cooperative and very pleasant at the time of my exam.  HEENT: Normocephalic and atraumatic. Pupils round and reactive. Oropharynx  clear. EOMs intact.  NECK: Supple.  CHEST: Clear anterior and posterior chest wall. No rhonchi, rales or  wheezing.  HEART: Regular rate and rhythm. No murmur. S1 and S2 noted.  ABDOMEN: Soft, nontender, and nondistended. Bowel sounds present.  RECTAL: Not done, not pertinent to present illness.  BREASTS: Not done, not pertinent to present illness.  GENITALIA: Not done, not pertinent to present illness.  EXTREMITIES: Left hip flexion of 90 degrees, 0 internal rotation, 70 degrees  of external rotation, 20 degrees of  abduction. She does ambulate with an  antalgic gait.   IMPRESSION:  Osteoarthritis left hip.   PLAN:  The patient will be admitted to [facility] to undergo left  total hip arthroplasty. Surgery will be performed by Dr. Ollen Gross.      Alexzandrew L. Julien Girt, P.A.      Ollen Gross, M.D.  Electronically Signed    ALP/MEDQ  D:  11/04/2005  T:  11/05/2005  Job:  [id number]   cc:   Ollen Gross, M.D.  Fax: [phone]   Tiburcio Pea, M.D.

## 2011-02-15 ENCOUNTER — Ambulatory Visit: Payer: Self-pay | Admitting: Family Medicine

## 2011-02-16 ENCOUNTER — Other Ambulatory Visit: Payer: Self-pay | Admitting: Orthopedic Surgery

## 2011-02-16 ENCOUNTER — Encounter (HOSPITAL_COMMUNITY): Payer: Medicare Other

## 2011-02-16 LAB — URINALYSIS, ROUTINE W REFLEX MICROSCOPIC
Glucose, UA: NEGATIVE mg/dL
Protein, ur: NEGATIVE mg/dL
Specific Gravity, Urine: 1.011 (ref 1.005–1.030)

## 2011-02-16 LAB — SURGICAL PCR SCREEN
MRSA, PCR: NEGATIVE
Staphylococcus aureus: NEGATIVE

## 2011-02-16 LAB — COMPREHENSIVE METABOLIC PANEL
ALT: 19 U/L (ref 0–35)
Albumin: 3.5 g/dL (ref 3.5–5.2)
Alkaline Phosphatase: 65 U/L (ref 39–117)
BUN: 15 mg/dL (ref 6–23)
Chloride: 104 mEq/L (ref 96–112)
Glucose, Bld: 87 mg/dL (ref 70–99)
Potassium: 4.3 mEq/L (ref 3.5–5.1)
Sodium: 138 mEq/L (ref 135–145)
Total Bilirubin: 0.4 mg/dL (ref 0.3–1.2)

## 2011-02-16 LAB — CBC
HCT: 35.3 % — ABNORMAL LOW (ref 36.0–46.0)
Platelets: 263 10*3/uL (ref 150–400)
RBC: 3.61 MIL/uL — ABNORMAL LOW (ref 3.87–5.11)
RDW: 13.7 % (ref 11.5–15.5)
WBC: 6.7 10*3/uL (ref 4.0–10.5)

## 2011-02-16 LAB — PROTIME-INR: INR: 0.93 (ref 0.00–1.49)

## 2011-02-17 ENCOUNTER — Encounter: Payer: Self-pay | Admitting: Family Medicine

## 2011-02-23 ENCOUNTER — Other Ambulatory Visit (HOSPITAL_COMMUNITY): Payer: Self-pay

## 2011-03-01 ENCOUNTER — Inpatient Hospital Stay (HOSPITAL_COMMUNITY)
Admission: RE | Admit: 2011-03-01 | Discharge: 2011-03-04 | DRG: 470 | Disposition: A | Discharge status: Home Health | Payer: Medicare Other | Source: Ambulatory Visit | Attending: Orthopedic Surgery | Admitting: Orthopedic Surgery

## 2011-03-01 DIAGNOSIS — Z79899 Other long term (current) drug therapy: Secondary | ICD-10-CM

## 2011-03-01 DIAGNOSIS — D62 Acute posthemorrhagic anemia: Secondary | ICD-10-CM | POA: Diagnosis not present

## 2011-03-01 DIAGNOSIS — Z780 Asymptomatic menopausal state: Secondary | ICD-10-CM

## 2011-03-01 DIAGNOSIS — M810 Age-related osteoporosis without current pathological fracture: Secondary | ICD-10-CM | POA: Diagnosis present

## 2011-03-01 DIAGNOSIS — Z01810 Encounter for preprocedural cardiovascular examination: Secondary | ICD-10-CM

## 2011-03-01 DIAGNOSIS — E7800 Pure hypercholesterolemia, unspecified: Secondary | ICD-10-CM | POA: Diagnosis present

## 2011-03-01 DIAGNOSIS — H547 Unspecified visual loss: Secondary | ICD-10-CM | POA: Diagnosis present

## 2011-03-01 DIAGNOSIS — Z01812 Encounter for preprocedural laboratory examination: Secondary | ICD-10-CM

## 2011-03-01 DIAGNOSIS — M1710 Unilateral primary osteoarthritis, unspecified knee: Principal | ICD-10-CM | POA: Diagnosis present

## 2011-03-01 DIAGNOSIS — M21069 Valgus deformity, not elsewhere classified, unspecified knee: Secondary | ICD-10-CM | POA: Diagnosis present

## 2011-03-02 LAB — CBC
HCT: 26.5 % — ABNORMAL LOW (ref 36.0–46.0)
Hemoglobin: 8.7 g/dL — ABNORMAL LOW (ref 12.0–15.0)
MCHC: 32.8 g/dL (ref 30.0–36.0)
RBC: 2.73 MIL/uL — ABNORMAL LOW (ref 3.87–5.11)

## 2011-03-02 LAB — BASIC METABOLIC PANEL
Chloride: 105 mEq/L (ref 96–112)
Creatinine, Ser: <0.47 mg/dL (ref 0.4–1.2)
Potassium: 4.2 mEq/L (ref 3.5–5.1)

## 2011-03-03 LAB — CBC
HCT: 24.4 % — ABNORMAL LOW (ref 36.0–46.0)
MCV: 96.4 fL (ref 78.0–100.0)
Platelets: 259 10*3/uL (ref 150–400)
RBC: 2.53 MIL/uL — ABNORMAL LOW (ref 3.87–5.11)
RDW: 13.1 % (ref 11.5–15.5)
WBC: 9.6 10*3/uL (ref 4.0–10.5)

## 2011-03-03 LAB — BASIC METABOLIC PANEL
BUN: 10 mg/dL (ref 6–23)
Chloride: 101 mEq/L (ref 96–112)
GFR calc non Af Amer: >60 mL/min (ref >60)
Potassium: 3.6 mEq/L (ref 3.5–5.1)
Sodium: 132 mEq/L — ABNORMAL LOW (ref 135–145)

## 2011-03-03 NOTE — Op Note (Signed)
Kathryn [name], Kathryn [name]                 ACCOUNT NO.:  [id number]  MEDICAL RECORD NO.:  [id number]  LOCATION:  0012                         FACILITY:  [facility]  PHYSICIAN:  Ollen Gross, M.D.    DATE OF BIRTH:  03/20/1931  DATE OF PROCEDURE: DATE OF DISCHARGE:                              OPERATIVE REPORT   PREOPERATIVE DIAGNOSIS:  Osteoarthritis of the right knee with severe valgus deformity.  POSTOPERATIVE DIAGNOSIS:  Osteoarthritis of the right knee with severe valgus deformity.  PROCEDURE:  Right total-knee arthroplasty.  SURGEON:  Ollen Gross, M.D.  ASSISTANT:  Alexzandrew L. Perkins, P.A.C.  ANESTHESIA:  Spinal.  ESTIMATED BLOOD LOSS:  Minimal.  DRAINS:  Hemovac x1.  TOURNIQUET TIME:  29 minutes at 300 mmHg.  COMPLICATIONS:  None.  CONDITION:  Stable to recovery.  BRIEF CLINICAL NOTE:  [name] is a 77 year old female with advanced end-stage arthritis of the right knee with about a 20-degree to 25- degrees valgus deformity.  She has failed nonoperative management and presents for a total-knee arthroplasty.  PROCEDURE IN DETAIL:  After the successful administration of spinal anesthetic, a tourniquet was placed high on her right thigh and her right lower extremity was prepped and draped in the usual sterile fashion.  Extremities wrapped in Esmarch, knee flexed, tourniquet inflated to 300 mmHg.  A midline incision was made with a 10-blade through the subcutaneous tissue to the level of the extensor mechanism. Given her severe valgus deformity, we decided to do a lateral arthrotomy.  A fresh blade was used to make a lateral parapatellar arthrotomy.  The soft tissue on the proximal lateral tibia was subperiosteally elevated along the joint line to the posterolateral corner, but not including the structures of the posterolateral corner. The patella was then everted medially, knee flexed 90 degrees, and ACL and PCL were removed.  The drill was used to create a starting  hole in the distal femur and the canal was thoroughly irrigated.  The 5-degrees right valgus alignment guide was placed and the distal femoral cutting block pinned to remove 11 mm off the distal femur.  I actually had to go up to 12 mm in order to get any bone.  Resection was made with an oscillating saw.  The sizing guide was placed and size 5 was the most appropriate.  Rotation was marked off the epicondylar axis.  The size 5 cutting block was placed and the anterior, posterior and chamfer cuts were made.  The tibia was subluxed forward and the menisci removed.  Extramedullary tibial alignment guide was placed referencing proximally at the medial aspect of the tibial tubercle and distally along the second metatarsal axis of the tibial crest.  The block was pinned to remove 2 mm from the more deficient lateral side.  Tibial resection was made with an oscillating saw.  Size 5 was also the most appropriate tibial component and the proximal tibia was prepared with the modular drill and keel punch for the size 5.  Femoral preparation was completed with the intercondylar cut.  Size 5 mobile bearing tibial trial and size 5 posterior stabilized femoral trial and a 12.5-mm posterior stabilized rotating platform insert trial were placed.  Full extension was achieved with excellent varus-valgus and anterior-posterior balance throughout the full range of motion.  The patella was everted and thickness measured to be 24 mm. Freehand resection was taken to 14 mm, 38 template was placed, lug holes were drilled, trial patella was placed and it tracks normally. Osteophytes were removed off the posterior femur with the trial in place.  All trials were removed and the cut bone surface was prepared with pulsatile lavage.  The cement was mixed and once ready for implantation the size 5 mobile bearing tibial tray, size 5 posterior stabilized femur and 38 patella were cemented into place.  The patella was  held with a clamp.  The trial 12.5-mm insert was placed, knee held in full extension and all extruded cement removed.  When the cement was fully hardened, then the permanent 12.5-mm posterior stabilized rotating platform insert was placed in the tibial tray.  The wound was copiously irrigated with saline solution and then the tourniquet was released for a total time of 29 minutes.  Bleeding was identified and cauterized. Further irrigation was performed.  Arthrotomy was closed with interrupted #1 PDS leaving open a small area from the superior to the inferior pole of the patella to serve as a mini lateral release. Flexion against gravity was 140 degrees and the patella tracks normally. Subcutaneous was closed with interrupted 2-0 Vicryl and subcuticular running 4-0 Monocryl.  The catheter for the Marcaine pain pump was placed and the pump was initiated.  The incision was cleaned and dried and Steri-Strips and a bulky sterile dressing were applied.  She was then placed into a knee immobilizer, awakened and transported to recovery in stable condition.     Ollen Gross, M.D.     FA/MEDQ  D:  03/01/2011  T:  03/01/2011  Job:  [id number]  Electronically Signed by Ollen Gross M.D. on 03/03/2011 06:01:41 PM

## 2011-03-04 LAB — CBC
HCT: 21.4 % — ABNORMAL LOW (ref 36.0–46.0)
Hemoglobin: 7 g/dL — ABNORMAL LOW (ref 12.0–15.0)
MCV: 97.3 fL (ref 78.0–100.0)
RBC: 2.2 MIL/uL — ABNORMAL LOW (ref 3.87–5.11)
RDW: 13.3 % (ref 11.5–15.5)
WBC: 7 10*3/uL (ref 4.0–10.5)

## 2011-03-04 LAB — BASIC METABOLIC PANEL
BUN: 14 mg/dL (ref 6–23)
CO2: 29 mEq/L (ref 19–32)
Chloride: 103 mEq/L (ref 96–112)
Glucose, Bld: 105 mg/dL — ABNORMAL HIGH (ref 70–99)
Potassium: 3.5 mEq/L (ref 3.5–5.1)
Sodium: 137 mEq/L (ref 135–145)

## 2011-03-05 LAB — TYPE AND SCREEN: Unit division: 0

## 2011-03-10 NOTE — H&P (Signed)
[name], [name]                 ACCOUNT NO.:  [id number]  MEDICAL RECORD NO.:  [id number]  LOCATION:  0012                         FACILITY:  [facility]  PHYSICIAN:  Ollen Gross, M.D.    DATE OF BIRTH:  10-11-1930  DATE OF ADMISSION:  03/01/2011 DATE OF DISCHARGE:                             HISTORY & PHYSICAL   CHIEF COMPLAINT:  Right knee pain.  HISTORY OF PRESENT ILLNESS:  The patient is a 77 year old female who has been seen by Dr. Lequita Halt for ongoing right knee pain.  She had a left total hip done several years ago and doing well with her hip, although her right knee continues to be problematic and interfering with her mobility.  It is felt she would benefit from undergoing surgical intervention.  She is noted have bone on bone changes.  She has got about 20 degrees valgus deformity.  Risks and benefits of the procedure have been discussed with the patient and she elected to proceed with surgery.  She has been seen preoperatively by Dr. Kerby Nora and felt that she is low risk with her knee replacement.  ALLERGIES:  No known drug allergies.  CURRENT MEDICATIONS:  Simvastatin 40 mg daily, diclofenac 75 mg daily, calcium 600 mg 2 tablets a day, vitamin D 1000 international units daily Osteo Bi-Flex 2 tablets per day, simvastatin 40 mg daily, Prempro 0.45/1.5 mg daily.  PAST MEDICAL HISTORY: 1. Impaired vision. 2. Hypercholesterolemia. 3. Osteoporosis. 4. Postmenopausal.  PAST SURGICAL HISTORY:  Left total hip replacement in February 2007 and also nasal surgery.  FAMILY HISTORY:  Father with heart failure.  Mother with cancer.  A sister with bone cancer and brother with heart disease.  SOCIAL HISTORY:  Married, nonsmoker, one to two glasses of wine daily. She does have a caregiver lined.  She does have a living will.  REVIEW OF SYSTEMS:  GENERAL:  No fevers, chills or night sweats.  NEURO: No seizures, syncope, or paralysis.  RESPIRATORY:  No shortness  breath, productive cough, or hemoptysis.  CARDIOVASCULAR:  No chest pain, angina, or orthopnea.  GI:  No nausea, vomiting, diarrhea, or constipation.  GU:  No dysuria, hematuria, or discharge. MUSCULOSKELETAL: Hip pain.  PHYSICAL EXAMINATION:  VITAL SIGNS:  Pulse 64, respirations 14, blood pressure 128/78. GENERAL:  A 77 year old female, well nourished, well developed, no acute distress. HEENT:  Normocephalic, atraumatic.  Pupils are round and reactive.  EOMs intact.  She does have contacts. NECK:  Supple.  No carotid bruits. CHEST:  Clear anterior and posterior chest walls.  No rhonchi, rales or wheezing.  She does have a mildly kyphotic upper thoracic spine. HEART:  Regular rate and rhythm without murmur.  S1, S2 noted. ABDOMEN:  Soft, nontender.  Bowel sounds present. RECTAL:  Not done, not pertinent to present illness. BREASTS:  Not done, not pertinent to present illness. GENITALIA:  Not done, not pertinent to present illness. EXTREMITIES:  Right knee significant valgus deformity.  Range of motion 5 to 120, tender more lateral than medial.  IMPRESSION:  Osteoarthritis, right knee.  PLAN:  The patient will be admitted to [facility] to undergo right total knee replacement arthroplasty.  Surgery will  be performed by Dr. Ollen Gross.     Alexzandrew L. Julien Girt, P.A.C.   ______________________________ Ollen Gross, M.D.    ALP/MEDQ  D:  03/01/2011  T:  03/01/2011  Job:  [id number]  cc:   Kerby Nora, MD  Electronically Signed by Patrica Duel P.A.C. on 03/04/2011 10:46:20 AM Electronically Signed by Ollen Gross M.D. on 03/10/2011 03:24:33 PM

## 2011-04-15 NOTE — Discharge Summary (Addendum)
Kathryn [name], Kathryn [name]                 ACCOUNT NO.:  [id number]  MEDICAL RECORD NO.:  [id number]  LOCATION:  1609                         FACILITY:  [facility]  PHYSICIAN:  Ollen Gross, M.D.    DATE OF BIRTH:  09-Jul-1931  DATE OF ADMISSION:  03/01/2011 DATE OF DISCHARGE:  03/04/2011                              DISCHARGE SUMMARY   ADMITTING DIAGNOSES: 1. Osteoarthritis, right knee. 2. Impaired vision. 3. Hypercholesterolemia. 4. Osteoporosis. 5. Postmenopausal.  DISCHARGE DIAGNOSES: 1. Osteoarthritis, right knee, status post right total knee     replacement arthroplasty. 2. Postoperative acute blood loss anemia. 3. Status post transfusion without sequelae. 4. Postoperative hyponatremia, improved. 5. Impaired vision. 6. Hypercholesterolemia. 7. Osteoporosis. 8. Postmenopausal.  PROCEDURE:  March 01, 2011, right total knee.  SURGEON:  Ollen Gross, M.D.  ASSISTANT:  Alexzandrew L. Perkins, P.A.C.  ANESTHESIA:  Spinal anesthesia with tourniquet time of 29 minutes.  CONSULTS:  None.  BRIEF HISTORY:  Kathryn [name] is a 77 year old female with advanced osteoarthritis of the right knee with about 20 to 25 degrees valgus deformity, failed nonoperative management, now presents for a total knee arthroplasty.  LABORATORY DATA:  CBC showed hemoglobin 11.6 on admission.  Serial CBCs followed, drop down to 8.7, then 8.2.  Last day of hospital stay, the hemoglobin was 7 with a hematocrit of 21.4.  She was given 2 units of blood and discharged home after the transfusion.  Chem panel not scanned in the chart.  Serial BMETs were followed for 3 days, though sodium did drop from 138 to 132, back up to 137.  Electrolytes remained within normal limits.  Blood group type O positive.  Nasal swabs were negative Staphylococcus aureus, negative for MRSA.  HOSPITAL COURSE:  The patient was admitted to [facility] and taken to OR, underwent above-stated procedure without complication.   The patient tolerated the procedure well.  Later transferred to the recovery room on orthopedic floor, started on p.o. and IV analgesic and 24 hours postop IV antibiotics.  Started on Xarelto for DVT prophylaxis.  Had good urinary output, started getting up out of bed on day 1, ambulated about 40 feet.  Hemovac drain was pulled.  By day 2, her hemoglobin was down a little bit further down to 8.2 from 8.7.  She is asymptomatic at this time.  She continued to progress with physical therapy, although had a little low-grade temperature.  Encouraged incentive spirometer and antipruritic medications.  Continued to progress well and by day 3, the patient was doing very well, although hemoglobin was down to 7 and felt that due to the blood loss anemia that she would benefit from undergoing blood.  She was progressing with her therapy but felt it would help out with her mobility and her strength and stamina.  She was given 2 units after she tolerated the blood and progressed with therapy.  She was discharged home.  DISCHARGE PLAN: 1. The patient was discharged home on March 04, 2011. 2. Discharge diagnoses, please see above. 3. Discharge medications, Nu-Iron, Robaxin, OxyIR, Xarelto.  Continue     home medications of Zocor.  DIET:  As tolerated.  ACTIVITY:  Weightbearing as tolerated, total  knee protocol.  Follow up in 2 weeks.  DISPOSITION:  Home.  CONDITION ON DISCHARGE:  Improved.     Alexzandrew L. Julien Girt, P.A.C.   ______________________________ Ollen Gross, M.D.    ALP/MEDQ  D:  04/14/2011  T:  04/14/2011  Job:  [id number]  cc:   Kerby Nora, MD Fax: [phone]  Electronically Signed by Patrica Duel P.A.C. on 04/15/2011 10:00:38 AM Electronically Signed by Ollen Gross M.D. on 04/16/2011 06:04:19 PM

## 2011-06-04 ENCOUNTER — Ambulatory Visit (HOSPITAL_BASED_OUTPATIENT_CLINIC_OR_DEPARTMENT_OTHER)
Admission: RE | Admit: 2011-06-04 | Discharge: 2011-06-04 | Disposition: A | Discharge status: Home / Self Care | Payer: Medicare Other | Source: Ambulatory Visit | Attending: Orthopedic Surgery | Admitting: Orthopedic Surgery

## 2011-06-04 DIAGNOSIS — Z01812 Encounter for preprocedural laboratory examination: Secondary | ICD-10-CM | POA: Insufficient documentation

## 2011-06-04 DIAGNOSIS — M24669 Ankylosis, unspecified knee: Secondary | ICD-10-CM | POA: Insufficient documentation

## 2011-06-04 DIAGNOSIS — Z96659 Presence of unspecified artificial knee joint: Secondary | ICD-10-CM | POA: Insufficient documentation

## 2011-06-06 NOTE — Op Note (Signed)
  Kathryn [name], Kathryn [name]                 ACCOUNT NO.:  [id number]  MEDICAL RECORD NO.:  [id number]  LOCATION:                                 FACILITY:  PHYSICIAN:  Ollen Gross, M.D.    DATE OF BIRTH:  01/17/1931  DATE OF PROCEDURE:  06/04/2011 DATE OF DISCHARGE:                              OPERATIVE REPORT   PREOPERATIVE DIAGNOSIS:  Right knee arthrofibrosis.  POSTOPERATIVE DIAGNOSIS:  Right knee arthrofibrosis.  PROCEDURE:  Right knee closed manipulation.  SURGEON:  Ollen Gross, MD  ASSISTANT:  None.  ANESTHESIA:  General.  ESTIMATED BLOOD LOSS:  None.  Pre-manipulation range of motion 5-90, post-manipulation range of motion 1-125.  COMPLICATIONS:  None.  CONDITION:  Stable to Recovery.  BRIEF CLINICAL NOTE:  Kathryn [name] is a 77 year old female who had a right total knee arthroplasty done approximately 3 months ago.  She works very hard with physical therapy, but stuck at about 90 to 95 degrees of flexion despite our best efforts to improve that further.  She is unable to do all of her functional activities and presents now for closed manipulation to gain more motion.  PROCEDURE IN DETAIL:  After successful administration of general anesthetic, exam under anesthesia shows range of 5-90 degrees.  I then placed my chest on the proximal tibia and flexed the knee with audible lysis of adhesions.  I was able to get her flexed to between 125 to 130 degrees. I then manipulated on extension and she got full extension.  We mobilized the patella to get that moving better.  She is subsequently awakened and transported to Recovery in stable condition.     Ollen Gross, M.D.     FA/MEDQ  D:  06/04/2011  T:  06/04/2011  Job:  [id number]  Electronically Signed by Ollen Gross M.D. on 06/06/2011 12:17:42 PM

## 2011-06-14 ENCOUNTER — Other Ambulatory Visit: Payer: Self-pay | Admitting: Family Medicine

## 2011-07-12 ENCOUNTER — Ambulatory Visit: Payer: Medicare Other

## 2011-09-07 ENCOUNTER — Other Ambulatory Visit: Payer: Self-pay | Admitting: Dermatology

## 2011-11-11 ENCOUNTER — Telehealth: Payer: Self-pay | Admitting: Family Medicine

## 2011-11-11 NOTE — Telephone Encounter (Signed)
Triage Record Num: [id number] Operator: Durward Mallard DiMatteis Patient Name: Kathryn [name] Call Date & Time: 11/11/2011 10:05:46AM Patient Phone: [phone] PCP: Kerby Nora Patient Gender: Female PCP Fax : [phone] Patient DOB: [birth year] Practice Name: [facility] Reason for Call: Caller: At[place]/Patient; PCP: Excell Seltzer.; CB#: [phone]; Call regarding Vomiting and Diarrhea; sx started 11/10/11; at 5:00pm; no vomiting today; diarrhea x3 today; no fever; drinking ginger ale and keeping it down; urinating wnl for pt; Triaged per Diarrhea or Other Change in Bowel Habits Guidelines; Homecare d/t new onset diarrhea and several episodes of nausea/vomiting Protocol(s) Used: Diarrhea or Other Change in Bowel Habits Recommended Outcome per Protocol: Provide Home/Self Care Reason for Outcome: New onset diarrhea with any of the following: mild abdominal cramping, generalized aching, temperature up to 101.5 F (38.6 C) or several episodes of nausea/vomiting Care Advice: ~ SYMPTOM / CONDITION MANAGEMENT Go to the ED if you have developed signs and symptoms of dehydration such as very dry mouth and tongue; increased pulse rate at rest; no urine output for 8 hours or more; increasing weakness or drowsiness, or lightheadedness when trying to sit upright or standing. ~ Vomiting and Diarrhea Care: - Do not eat solid foods until vomiting subsides. - Begin taking fluids by sucking on ice chips or popsicles or taking sips of cool, clear fluids (soda, fruit juices that are low acid, sports drinks or nonprescription oral rehydration solution). - Gradually drink larger amounts of these fluids so that you are drinking six to eight 8 oz. (1.2 to 1.6 liters) of fluids a day. - Keep activity to a minimum. - Once vomiting and diarrhea subside, eat smaller, more frequent meals of easily digested foods such as crackers, toast, bananas, rice, cooked cereal, applesauce, broth, baked or mashed  potatoes, chicken or [place] without skin. Eat slowly. - Take fluids 30 minutes before or 60 minutes after meals. - Avoid high fat, highly seasoned, high fiber or high sugar content foods. - Avoid extremely hot or cold foods. - Do not take pain medication (such as aspirin, NSAIDs) while nauseated or vomiting. - Do not drink caffeinated or alcoholic beverages. ~ 11/11/2011 10:13:38AM Page 1 of 1 [id number]

## 2011-12-10 ENCOUNTER — Other Ambulatory Visit: Payer: Self-pay | Admitting: Family Medicine

## 2012-01-31 ENCOUNTER — Other Ambulatory Visit: Payer: Self-pay | Admitting: *Deleted

## 2012-01-31 NOTE — Telephone Encounter (Signed)
Received faxed refill request from pharmacy. Patient has not been seen in almost a year. Is it okay to refill medication? 

## 2012-01-31 NOTE — Telephone Encounter (Signed)
Make appt for CPX then fill until then.

## 2012-02-01 MED ORDER — SIMVASTATIN 40 MG PO TABS: 40.0000 mg | ORAL_TABLET | Freq: Every day | ORAL | Status: DC

## 2012-02-01 NOTE — Telephone Encounter (Signed)
Appt scheduled for July and medication refilled until then

## 2012-02-15 DIAGNOSIS — M1710 Unilateral primary osteoarthritis, unspecified knee: Secondary | ICD-10-CM | POA: Diagnosis not present

## 2012-03-14 DIAGNOSIS — Z1231 Encounter for screening mammogram for malignant neoplasm of breast: Secondary | ICD-10-CM | POA: Diagnosis not present

## 2012-03-14 LAB — HM MAMMOGRAPHY

## 2012-03-15 ENCOUNTER — Encounter: Payer: Self-pay | Admitting: Family Medicine

## 2012-03-16 ENCOUNTER — Encounter: Payer: Self-pay | Admitting: Family Medicine

## 2012-03-28 ENCOUNTER — Encounter: Payer: Self-pay | Admitting: Family Medicine

## 2012-03-28 ENCOUNTER — Ambulatory Visit (INDEPENDENT_AMBULATORY_CARE_PROVIDER_SITE_OTHER): Payer: Medicare Other | Admitting: Family Medicine

## 2012-03-28 VITALS — BP 110/70 | HR 59 | Temp 97.6°F | Ht 68.0 in | Wt 131.2 lb

## 2012-03-28 DIAGNOSIS — J309 Allergic rhinitis, unspecified: Secondary | ICD-10-CM

## 2012-03-28 DIAGNOSIS — E785 Hyperlipidemia, unspecified: Secondary | ICD-10-CM | POA: Diagnosis not present

## 2012-03-28 DIAGNOSIS — R8290 Unspecified abnormal findings in urine: Secondary | ICD-10-CM

## 2012-03-28 DIAGNOSIS — R82998 Other abnormal findings in urine: Secondary | ICD-10-CM | POA: Diagnosis not present

## 2012-03-28 DIAGNOSIS — Z0000 Encounter for general adult medical examination without abnormal findings: Secondary | ICD-10-CM | POA: Diagnosis not present

## 2012-03-28 DIAGNOSIS — D649 Anemia, unspecified: Secondary | ICD-10-CM

## 2012-03-28 DIAGNOSIS — R920 Mammographic microcalcification found on diagnostic imaging of breast: Secondary | ICD-10-CM | POA: Diagnosis not present

## 2012-03-28 DIAGNOSIS — M899 Disorder of bone, unspecified: Secondary | ICD-10-CM

## 2012-03-28 LAB — POCT URINALYSIS DIPSTICK
Bilirubin, UA: NEGATIVE
Glucose, UA: NEGATIVE
Ketones, UA: NEGATIVE
Leukocytes, UA: NEGATIVE
Nitrite, UA: NEGATIVE
pH, UA: 6

## 2012-03-28 MED ORDER — FLUTICASONE PROPIONATE 50 MCG/ACT NA SUSP: 2.0000 | Freq: Every day | NASAL | Status: DC

## 2012-03-28 NOTE — Patient Instructions (Addendum)
Return for fasting labs. Trial of nasal steroid spray to treat allergies... If not improving you can add zyrtec. Look into shingles vaccine coverage. Stop at front desk to schedule bone density. Increase fluids to assist with urine odor.

## 2012-03-28 NOTE — Assessment & Plan Note (Signed)
Trial of nasal steorid.. May consider zyrtec at bedtime.

## 2012-03-28 NOTE — Progress Notes (Signed)
Subjective:    Patient ID: Kathryn [name], female    DOB: [birth year], 78 y.o.   MRN: [id number]  HPI  I have personally reviewed the Medicare Annual Wellness questionnaire and have noted 1. The patient's medical and social history 2. Their use of alcohol, tobacco or illicit drugs 3. Their current medications and supplements 4. The patient's functional ability including ADL's, fall risks, home safety risks and hearing or visual             impairment. 5. Diet and physical activities 6. Evidence for depression or mood disorders The patients weight, height, BMI and visual acuity have been recorded in the chart I have made referrals, counseling and provided education to the patient based review of the above and I have provided the pt with a written personalized care plan for preventive services.  Elevated Cholesterol:  Due for re-eval... On zocor 40 mg daily Lab Results  Component Value Date   CHOL 161 12/26/2009   HDL 64.60 12/26/2009   LDLCALC 85 12/26/2009   LDLDIRECT 116.9 09/25/2008   TRIG 59.0 12/26/2009   CHOLHDL 2 12/26/2009   Using medications without problems: Muscle aches: None Diet compliance:Good Exercise:Occ Other complaints:  Osteopenia: on no med, previously on boniva  Urine odor  X 6 month, no dysuria, no vaginal irritation, no vaginal discharge. No change in diet, no medications.  Review of Systems  Constitutional: Negative for fever, fatigue and unexpected weight change.  HENT: Positive for rhinorrhea. Negative for ear pain, congestion, sore throat, sneezing, trouble swallowing and sinus pressure.        Allergies.Kathryn [name] has not helped, never tried zyrtec.  Eyes: Negative for pain and itching.  Respiratory: Negative for cough, shortness of breath and wheezing.   Cardiovascular: Negative for chest pain, palpitations and leg swelling.  Gastrointestinal: Negative for nausea, abdominal pain, diarrhea, constipation and blood in stool.  Genitourinary: Negative for  dysuria, hematuria, vaginal discharge, difficulty urinating and menstrual problem.  Skin: Negative for rash.  Neurological: Negative for syncope, weakness, light-headedness, numbness and headaches.  Psychiatric/Behavioral: Negative for confusion and dysphoric mood. The patient is not nervous/anxious.        Objective:   Physical Exam  Constitutional: Vital signs are normal. She appears well-developed and well-nourished. She is cooperative.  Non-toxic appearance. She does not appear ill. No distress.  HENT:  Head: Normocephalic.  Right Ear: Hearing, tympanic membrane, external ear and ear canal normal.  Left Ear: Hearing, tympanic membrane, external ear and ear canal normal.  Nose: Nose normal.  Eyes: Conjunctivae, EOM and lids are normal. Pupils are equal, round, and reactive to light. No foreign bodies found.  Neck: Trachea normal and normal range of motion. Neck supple. Carotid bruit is not present. No mass and no thyromegaly present.  Cardiovascular: Normal rate, regular rhythm, S1 normal, S2 normal, normal heart sounds and intact distal pulses.  Exam reveals no gallop.   No murmur heard. Pulmonary/Chest: Effort normal and breath sounds normal. No respiratory distress. She has no wheezes. She has no rhonchi. She has no rales.  Abdominal: Soft. Normal appearance and bowel sounds are normal. She exhibits no distension, no fluid wave, no abdominal bruit and no mass. There is no hepatosplenomegaly. There is no tenderness. There is no rebound, no guarding and no CVA tenderness. No hernia.  Genitourinary: No breast swelling, tenderness, discharge or bleeding. Pelvic exam was performed with patient prone.       No pap indicated  Lymphadenopathy:    She has  no cervical adenopathy.    She has no axillary adenopathy.  Neurological: She is alert. She has normal strength. No cranial nerve deficit or sensory deficit.  Skin: Skin is warm, dry and intact. No rash noted.  Psychiatric: Her speech is  normal and behavior is normal. Judgment normal. Her mood appears not anxious. Cognition and memory are normal. She does not exhibit a depressed mood.          Assessment & Plan:  The patient's preventative maintenance and recommended screening tests for an annual wellness exam were reviewed in full today. Brought up to date unless services declined.  Counselled on the importance of diet, exercise, and its role in overall health and mortality. The patient's FH and SH was reviewed, including their home life, tobacco status, and drug and alcohol status.   Nonsmoker PNA and TD uptodate, consider shingles Colon: Dr. Marina Goodell, 2 polyps, repeat due in 5 years (2015) DEXA: osteopenia, due for bone density, not on any med currently. Mammogram: due for repeat abnormal one today. Vaginal: no indication due to age.

## 2012-03-28 NOTE — Assessment & Plan Note (Signed)
Due for re-eval. 

## 2012-03-29 ENCOUNTER — Encounter: Payer: Self-pay | Admitting: Family Medicine

## 2012-04-04 ENCOUNTER — Other Ambulatory Visit (INDEPENDENT_AMBULATORY_CARE_PROVIDER_SITE_OTHER): Payer: Medicare Other

## 2012-04-04 DIAGNOSIS — E785 Hyperlipidemia, unspecified: Secondary | ICD-10-CM | POA: Diagnosis not present

## 2012-04-04 DIAGNOSIS — M899 Disorder of bone, unspecified: Secondary | ICD-10-CM | POA: Diagnosis not present

## 2012-04-04 DIAGNOSIS — D649 Anemia, unspecified: Secondary | ICD-10-CM | POA: Diagnosis not present

## 2012-04-04 LAB — CBC WITH DIFFERENTIAL/PLATELET
Basophils Absolute: 0 10*3/uL (ref 0.0–0.1)
Eosinophils Relative: 3.8 % (ref 0.0–5.0)
HCT: 35.1 % — ABNORMAL LOW (ref 36.0–46.0)
Hemoglobin: 11.8 g/dL — ABNORMAL LOW (ref 12.0–15.0)
Lymphocytes Relative: 15.5 % (ref 12.0–46.0)
Lymphs Abs: 0.9 10*3/uL (ref 0.7–4.0)
Monocytes Relative: 8.1 % (ref 3.0–12.0)
Neutro Abs: 4 10*3/uL (ref 1.4–7.7)
RDW: 13.9 % (ref 11.5–14.6)
WBC: 5.5 10*3/uL (ref 4.5–10.5)

## 2012-04-04 LAB — COMPREHENSIVE METABOLIC PANEL
ALT: 19 U/L (ref 0–35)
CO2: 27 mEq/L (ref 19–32)
Calcium: 9.5 mg/dL (ref 8.4–10.5)
Chloride: 106 mEq/L (ref 96–112)
Creatinine, Ser: 0.8 mg/dL (ref 0.4–1.2)
GFR: 72.56 mL/min (ref >60.00)
Glucose, Bld: 91 mg/dL (ref 70–99)
Sodium: 140 mEq/L (ref 135–145)
Total Protein: 6.8 g/dL (ref 6.0–8.3)

## 2012-04-04 LAB — LIPID PANEL: Cholesterol: 190 mg/dL (ref 0–200)

## 2012-04-05 LAB — VITAMIN D 25 HYDROXY (VIT D DEFICIENCY, FRACTURES): Vit D, 25-Hydroxy: 47 ng/mL (ref 30–89)

## 2012-04-18 ENCOUNTER — Other Ambulatory Visit: Payer: Self-pay | Admitting: *Deleted

## 2012-04-18 DIAGNOSIS — M949 Disorder of cartilage, unspecified: Secondary | ICD-10-CM | POA: Diagnosis not present

## 2012-04-18 DIAGNOSIS — M810 Age-related osteoporosis without current pathological fracture: Secondary | ICD-10-CM

## 2012-04-18 DIAGNOSIS — M899 Disorder of bone, unspecified: Secondary | ICD-10-CM | POA: Diagnosis not present

## 2012-04-21 ENCOUNTER — Other Ambulatory Visit: Payer: Self-pay | Admitting: Obstetrics and Gynecology

## 2012-04-21 DIAGNOSIS — M810 Age-related osteoporosis without current pathological fracture: Secondary | ICD-10-CM

## 2012-05-11 ENCOUNTER — Encounter: Payer: Self-pay | Admitting: Gynecology

## 2012-05-11 DIAGNOSIS — E7800 Pure hypercholesterolemia, unspecified: Secondary | ICD-10-CM | POA: Insufficient documentation

## 2012-05-18 ENCOUNTER — Telehealth: Payer: Self-pay | Admitting: Family Medicine

## 2012-05-18 NOTE — Telephone Encounter (Signed)
Caller: [name]/Patient; Patient Name: Kathryn [name]; PCP: Excell Seltzer.; Best Callback Phone Number: [phone]. Call regarding: runny nose. Onset longer than one month. Patient was instructed to call back after trying prescription nasal spray. Reports she has used two complete bottles of nasal spray with no improvement. Emergent symptom of "Being treated by provider for allergies and symptoms not responding or are worsening with prescribed treatment in expected timeframe" positive per Allergies guideline. Patient reports she was told that a prescription would be called in for her if the nasal spray did not improve her symptoms. PLEASE CALL THE PATIENT BACK REGARDING IF A PRESCRIPTION CAN BE CALLED IN FOR HER.

## 2012-05-19 MED ORDER — CETIRIZINE HCL 10 MG PO TABS: 10.0000 mg | ORAL_TABLET | Freq: Every day | ORAL | Status: DC

## 2012-05-19 NOTE — Telephone Encounter (Signed)
Has she also tried Zyrtec OTc as discussed at last OV?

## 2012-05-19 NOTE — Telephone Encounter (Signed)
Pt did try zyrtec, took one of her husbands, and says it seemed to work well.  She's asking that a script for this be sent to [facility], so that her insurance will pay for it.

## 2012-05-23 ENCOUNTER — Ambulatory Visit (INDEPENDENT_AMBULATORY_CARE_PROVIDER_SITE_OTHER): Payer: Medicare Other | Admitting: Obstetrics and Gynecology

## 2012-05-23 ENCOUNTER — Encounter: Payer: Self-pay | Admitting: Obstetrics and Gynecology

## 2012-05-23 VITALS — BP 122/78 | Ht 68.0 in | Wt 133.0 lb

## 2012-05-23 DIAGNOSIS — R921 Mammographic calcification found on diagnostic imaging of breast: Secondary | ICD-10-CM

## 2012-05-23 DIAGNOSIS — M949 Disorder of cartilage, unspecified: Secondary | ICD-10-CM | POA: Diagnosis not present

## 2012-05-23 DIAGNOSIS — N952 Postmenopausal atrophic vaginitis: Secondary | ICD-10-CM | POA: Diagnosis not present

## 2012-05-23 DIAGNOSIS — M8580 Other specified disorders of bone density and structure, unspecified site: Secondary | ICD-10-CM

## 2012-05-23 DIAGNOSIS — M899 Disorder of bone, unspecified: Secondary | ICD-10-CM | POA: Diagnosis not present

## 2012-05-23 DIAGNOSIS — R928 Other abnormal and inconclusive findings on diagnostic imaging of breast: Secondary | ICD-10-CM

## 2012-05-23 DIAGNOSIS — Z780 Asymptomatic menopausal state: Secondary | ICD-10-CM | POA: Diagnosis not present

## 2012-05-23 MED ORDER — IBANDRONATE SODIUM 150 MG PO TABS: ORAL_TABLET | ORAL | Status: DC

## 2012-05-23 NOTE — Progress Notes (Signed)
Patient came to see me today for further followup. She had previously taken Fosamax for 2 years and then stopped it. She was on Boniva for approximately 2 years and then stopped at about a year ago. She did this after her right knee replacement. She has also had her left hip replaced. She has had no fractures. She had a bone density this summer showing a worse T score of -2.4. She has lost 14.7% bone of her wrist, 3.1% bone loss of her spine, and 7.7% bone loss of her right hip. These numbers are since her last bone density. She had lab work through her PCP which included a normal comprehensive metabolic panel and vitamin D level. She has. Her hormone replacement after her right knee surgery. She is having some hot flashes and vaginal dryness but feels they are tolerable without intervention. She is having no urinary symptoms. She just had a mammogram showing calcification in her left breast which was felt to be nonsuspicious. She is due for followup mammogram in 6 months. She did not have any side effects from her Boniva. She is having no vaginal bleeding or pelvic pain. She is always had normal Pap smears. Her last Pap smear was 2010.  ROS: 12 system review done. Pertinent positives above. Other positives include hyperlipidemia,leg cramps at night,recurrent urinary tract infections, and vitamin B12 deficiency.  Physical examination: Kennon Portela present. HEENT within normal limits. Neck: Thyroid not large. No masses. Supraclavicular nodes: not enlarged. Breasts: Examined in both sitting and lying  position. No skin changes and no masses. Abdomen: Soft no guarding rebound or masses or hernia. Pelvic: External: Within normal limits. BUS: Within normal limits. Vaginal:within normal limits. Poor estrogen effect. No evidence of cystocele rectocele or enterocele. Cervix: clean. Uterus: Normal size and shape. Adnexa: No masses. Rectovaginal exam: Confirmatory and negative. Extremities: Within normal  limits.  Assessment: #1. Severe osteopenia #2. Atrophic vaginitis #3. Menopausal symptoms #4. Calcifications of left breast  Plan: Discussed HRT and we will not start it. We did reinitiate Boniva 150 mg monthly. Explicit instructions given. She will do a followup mammogram in 6 months.The new Pap smear guidelines were discussed with the patient. No pap done.

## 2012-05-23 NOTE — Patient Instructions (Signed)
Schedule followup mammogram in 6 months.

## 2012-05-31 ENCOUNTER — Other Ambulatory Visit: Payer: Self-pay | Admitting: *Deleted

## 2012-06-01 MED ORDER — DICLOFENAC SODIUM 75 MG PO TBEC: 75.0000 mg | DELAYED_RELEASE_TABLET | Freq: Two times a day (BID) | ORAL | Status: DC

## 2012-06-22 ENCOUNTER — Other Ambulatory Visit: Payer: Self-pay

## 2012-06-22 MED ORDER — SIMVASTATIN 40 MG PO TABS: 40.0000 mg | ORAL_TABLET | Freq: Every day | ORAL | Status: DC

## 2012-06-22 NOTE — Telephone Encounter (Signed)
simvastatin 40 mg #[facility] 3 [facility]

## 2012-08-29 ENCOUNTER — Ambulatory Visit (INDEPENDENT_AMBULATORY_CARE_PROVIDER_SITE_OTHER): Payer: Medicare Other

## 2012-08-29 DIAGNOSIS — Z23 Encounter for immunization: Secondary | ICD-10-CM | POA: Diagnosis not present

## 2012-11-07 DIAGNOSIS — R928 Other abnormal and inconclusive findings on diagnostic imaging of breast: Secondary | ICD-10-CM | POA: Diagnosis not present

## 2012-11-07 DIAGNOSIS — Z09 Encounter for follow-up examination after completed treatment for conditions other than malignant neoplasm: Secondary | ICD-10-CM | POA: Diagnosis not present

## 2012-11-08 ENCOUNTER — Encounter: Payer: Self-pay | Admitting: Family Medicine

## 2012-11-28 ENCOUNTER — Encounter: Payer: Self-pay | Admitting: Family Medicine

## 2012-11-28 ENCOUNTER — Ambulatory Visit (INDEPENDENT_AMBULATORY_CARE_PROVIDER_SITE_OTHER): Payer: Medicare Other | Admitting: Family Medicine

## 2012-11-28 VITALS — BP 120/72 | HR 98 | Temp 97.8°F | Ht 68.0 in | Wt 138.0 lb

## 2012-11-28 DIAGNOSIS — S139XXA Sprain of joints and ligaments of unspecified parts of neck, initial encounter: Secondary | ICD-10-CM | POA: Diagnosis not present

## 2012-11-28 DIAGNOSIS — S161XXA Strain of muscle, fascia and tendon at neck level, initial encounter: Secondary | ICD-10-CM

## 2012-11-28 MED ORDER — SIMVASTATIN 40 MG PO TABS: 40.0000 mg | ORAL_TABLET | Freq: Every day | ORAL | Status: DC

## 2012-11-28 MED ORDER — CYCLOBENZAPRINE HCL 5 MG PO TABS: 5.0000 mg | ORAL_TABLET | Freq: Every evening | ORAL | Status: DC | PRN

## 2012-11-28 NOTE — Patient Instructions (Addendum)
Start neck stretching exercsies. Heat. Increase diclofenac to twice a day. Try muscle relaxant at night.  Call if neck pain is not improving in 2 week.

## 2012-11-28 NOTE — Assessment & Plan Note (Signed)
Treat with increased dose of NSAIDs, heat, stretches and muscle relaxant. Call if not improving as expected. Info provided on stretches.

## 2012-11-28 NOTE — Progress Notes (Signed)
  Subjective:    Patient ID: Kathryn [name], female    DOB: 10/01/1930, 79 y.o.   MRN: [id number]  HPI 79 year old female presents with 2-3 months of left lateral neck pain, constant. Some pain radiation to left upper posterior neck when she turns her head. Pain is worsen when she moves head.  Noted lump  When she swallowed in left neck 3-4 weeks ago, gone now. Never able to palpate a lump. No falls leading up to pain. No numbness, no weakness.  She tried anew pillow, no correction of issue. Tylenol helps minimally.  No history of neck issues.    Review of Systems  Constitutional: Negative for fever and fatigue.  HENT: Negative for ear pain.   Eyes: Negative for pain.  Respiratory: Negative for chest tightness and shortness of breath.   Cardiovascular: Negative for chest pain, palpitations and leg swelling.  Gastrointestinal: Negative for abdominal pain.  Genitourinary: Negative for dysuria.       Objective:   Physical Exam  Constitutional: Vital signs are normal. She appears well-developed and well-nourished. She is cooperative.  Non-toxic appearance. She does not appear ill. No distress.  HENT:  Head: Normocephalic.  Right Ear: Hearing, tympanic membrane, external ear and ear canal normal. Tympanic membrane is not erythematous, not retracted and not bulging.  Left Ear: Hearing, tympanic membrane, external ear and ear canal normal. Tympanic membrane is not erythematous, not retracted and not bulging.  Nose: No mucosal edema or rhinorrhea. Right sinus exhibits no maxillary sinus tenderness and no frontal sinus tenderness. Left sinus exhibits no maxillary sinus tenderness and no frontal sinus tenderness.  Mouth/Throat: Uvula is midline, oropharynx is clear and moist and mucous membranes are normal.  Eyes: Conjunctivae, EOM and lids are normal. Pupils are equal, round, and reactive to light. No foreign bodies found.  Neck: Trachea normal and normal range of motion. Neck supple.  Carotid bruit is not present. No mass and no thyromegaly present.  Cardiovascular: Normal rate, regular rhythm, S1 normal, S2 normal, normal heart sounds, intact distal pulses and normal pulses.  Exam reveals no gallop and no friction rub.   No murmur heard. Pulmonary/Chest: Effort normal and breath sounds normal. Not tachypneic. No respiratory distress. She has no decreased breath sounds. She has no wheezes. She has no rhonchi. She has no rales.  Abdominal: Soft. Normal appearance and bowel sounds are normal. There is no tenderness.  Musculoskeletal:       Cervical back: She exhibits decreased range of motion. She exhibits no tenderness and no bony tenderness.  ttp over insertion or left SCM muscle  Neurological: She is alert.  Skin: Skin is warm, dry and intact. No rash noted.  Psychiatric: Her speech is normal and behavior is normal. Judgment and thought content normal. Her mood appears not anxious. Cognition and memory are normal. She does not exhibit a depressed mood.          Assessment & Plan:

## 2012-12-08 ENCOUNTER — Other Ambulatory Visit: Payer: Self-pay | Admitting: *Deleted

## 2012-12-08 MED ORDER — DICLOFENAC SODIUM 75 MG PO TBEC: 75.0000 mg | DELAYED_RELEASE_TABLET | Freq: Two times a day (BID) | ORAL | Status: DC

## 2013-01-23 DIAGNOSIS — H2510 Age-related nuclear cataract, unspecified eye: Secondary | ICD-10-CM | POA: Diagnosis not present

## 2013-01-23 DIAGNOSIS — H18419 Arcus senilis, unspecified eye: Secondary | ICD-10-CM | POA: Diagnosis not present

## 2013-02-14 ENCOUNTER — Other Ambulatory Visit: Payer: Self-pay | Admitting: *Deleted

## 2013-02-14 MED ORDER — SIMVASTATIN 40 MG PO TABS: 40.0000 mg | ORAL_TABLET | Freq: Every day | ORAL | Status: DC

## 2013-02-15 MED ORDER — CYCLOBENZAPRINE HCL 5 MG PO TABS: 5.0000 mg | ORAL_TABLET | Freq: Every evening | ORAL | Status: DC | PRN

## 2013-02-15 MED ORDER — DICLOFENAC SODIUM 75 MG PO TBEC: 75.0000 mg | DELAYED_RELEASE_TABLET | Freq: Two times a day (BID) | ORAL | Status: DC

## 2013-04-02 DIAGNOSIS — H2510 Age-related nuclear cataract, unspecified eye: Secondary | ICD-10-CM | POA: Diagnosis not present

## 2013-04-02 DIAGNOSIS — H269 Unspecified cataract: Secondary | ICD-10-CM | POA: Diagnosis not present

## 2013-04-03 DIAGNOSIS — H2510 Age-related nuclear cataract, unspecified eye: Secondary | ICD-10-CM | POA: Diagnosis not present

## 2013-04-23 DIAGNOSIS — H2510 Age-related nuclear cataract, unspecified eye: Secondary | ICD-10-CM | POA: Diagnosis not present

## 2013-04-23 DIAGNOSIS — H269 Unspecified cataract: Secondary | ICD-10-CM | POA: Diagnosis not present

## 2013-05-08 ENCOUNTER — Encounter: Payer: Self-pay | Admitting: Family Medicine

## 2013-05-08 DIAGNOSIS — R928 Other abnormal and inconclusive findings on diagnostic imaging of breast: Secondary | ICD-10-CM | POA: Diagnosis not present

## 2013-05-08 DIAGNOSIS — Z09 Encounter for follow-up examination after completed treatment for conditions other than malignant neoplasm: Secondary | ICD-10-CM | POA: Diagnosis not present

## 2013-05-29 ENCOUNTER — Encounter: Payer: Medicare Other | Admitting: Gynecology

## 2013-05-29 DIAGNOSIS — M1710 Unilateral primary osteoarthritis, unspecified knee: Secondary | ICD-10-CM | POA: Diagnosis not present

## 2013-06-01 ENCOUNTER — Other Ambulatory Visit: Payer: Self-pay | Admitting: Family Medicine

## 2013-06-19 ENCOUNTER — Encounter: Payer: Self-pay | Admitting: Gynecology

## 2013-06-19 ENCOUNTER — Ambulatory Visit (INDEPENDENT_AMBULATORY_CARE_PROVIDER_SITE_OTHER): Payer: Medicare Other | Admitting: Gynecology

## 2013-06-19 VITALS — BP 130/82 | Ht 67.25 in | Wt 138.8 lb

## 2013-06-19 DIAGNOSIS — M899 Disorder of bone, unspecified: Secondary | ICD-10-CM | POA: Diagnosis not present

## 2013-06-19 DIAGNOSIS — R350 Frequency of micturition: Secondary | ICD-10-CM

## 2013-06-19 DIAGNOSIS — N952 Postmenopausal atrophic vaginitis: Secondary | ICD-10-CM | POA: Diagnosis not present

## 2013-06-19 DIAGNOSIS — M8580 Other specified disorders of bone density and structure, unspecified site: Secondary | ICD-10-CM

## 2013-06-19 LAB — URINALYSIS W MICROSCOPIC + REFLEX CULTURE
Bacteria, UA: NONE SEEN
Bilirubin Urine: NEGATIVE
Crystals: NONE SEEN
Glucose, UA: NEGATIVE mg/dL
Ketones, ur: NEGATIVE mg/dL
Protein, ur: NEGATIVE mg/dL
WBC, UA: NONE SEEN WBC/hpf (ref <3)

## 2013-06-19 LAB — CREATININE, SERUM: Creat: 0.81 mg/dL (ref 0.50–1.10)

## 2013-06-19 MED ORDER — NONFORMULARY OR COMPOUNDED ITEM: Status: DC

## 2013-06-19 NOTE — Patient Instructions (Signed)
Shingles Vaccine What You Need to Know WHAT IS SHINGLES?  Shingles is a painful skin rash, often with blisters. It is also called Herpes Zoster or just Zoster.  A shingles rash usually appears on one side of the face or body and lasts from 2 to 4 weeks. Its main symptom is pain, which can be quite severe. Other symptoms of shingles can include fever, headache, chills, and upset stomach. Very rarely, a shingles infection can lead to pneumonia, hearing problems, blindness, brain inflammation (encephalitis), or death.  For about 1 person in 5, severe pain can continue even after the rash clears up. This is called post-herpetic neuralgia.  Shingles is caused by the Varicella Zoster virus. This is the same virus that causes chickenpox. Only someone who has had a case of chickenpox or rarely, has gotten chickenpox vaccine, can get shingles. The virus stays in your body. It can reappear many years later to cause a case of shingles.  You cannot catch shingles from another person with shingles. However, a person who has never had chickenpox (or chickenpox vaccine) could get chickenpox from someone with shingles. This is not very common.  Shingles is far more common in people 50 and older than in younger people. It is also more common in people whose immune systems are weakened because of a disease such as cancer or drugs such as steroids or chemotherapy.  At least 1 million people get shingles per year in the [place]. SHINGLES VACCINE  A vaccine for shingles was licensed in 2006. In clinical trials, the vaccine reduced the risk of shingles by 50%. It can also reduce the pain in people who still get shingles after being vaccinated.  A single dose of shingles vaccine is recommended for adults 60 years of age and older. SOME PEOPLE SHOULD NOT GET SHINGLES VACCINE OR SHOULD WAIT A person should not get shingles vaccine if he or she:  Has ever had a life-threatening allergic reaction to gelatin, the  antibiotic neomycin, or any other component of shingles vaccine. Tell your caregiver if you have any severe allergies.  Has a weakened immune system because of current:  AIDS or another disease that affects the immune system.  Treatment with drugs that affect the immune system, such as prolonged use of high-dose steroids.  Cancer treatment, such as radiation or chemotherapy.  Cancer affecting the bone marrow or lymphatic system, such as leukemia or lymphoma.  Is pregnant, or might be pregnant. Women should not become pregnant until at least 4 weeks after getting shingles vaccine. Someone with a minor illness, such as a cold, may be vaccinated. Anyone with a moderate or severe acute illness should usually wait until he or she recovers before getting the vaccine. This includes anyone with a temperature of 101.3 F (38 C) or higher. WHAT ARE THE RISKS FROM SHINGLES VACCINE?  A vaccine, like any medicine, could possibly cause serious problems, such as severe allergic reactions. However, the risk of a vaccine causing serious harm, or death, is extremely small.  No serious problems have been identified with shingles vaccine. Mild Problems  Redness, soreness, swelling, or itching at the site of the injection (about 1 person in 3).  Headache (about 1 person in 70). Like all vaccines, shingles vaccine is being closely monitored for unusual or severe problems. WHAT IF THERE IS A MODERATE OR SEVERE REACTION? What should I look for? Any unusual condition, such as a severe allergic reaction or a high fever. If a severe allergic reaction   occurred, it would be within a few minutes to an hour after the shot. Signs of a serious allergic reaction can include difficulty breathing, weakness, hoarseness or wheezing, a fast heartbeat, hives, dizziness, paleness, or swelling of the throat. What should I do?  Call your caregiver, or get the person to a caregiver right away.  Tell the caregiver what  happened, the date and time it happened, and when the vaccination was given.  Ask the caregiver to report the reaction by filing a Vaccine Adverse Event Reporting System (VAERS) form. Or, you can file this report through the VAERS web site at www.vaers.LAgents.no or by calling 1-913-395-5639. VAERS does not provide medical advice. HOW CAN I LEARN MORE?  Ask your caregiver. He or she can give you the vaccine package insert or suggest other sources of information.  Contact the Centers for Disease Control and Prevention (CDC):  Call 539-616-9507 (1-800-CDC-INFO).  Visit the CDC website at PicCapture.uy CDC Shingles Vaccine VIS (07/02/08) Document Released: 07/11/2006 Document Revised: 12/06/2011 Document Reviewed: 07/02/2008 [employer] Patient Information 2014 [place], [place]. Osteoporosis Throughout your life, your body breaks down old bone and replaces it with new bone. As you get older, your body does not replace bone as quickly as it breaks it down. By the age of 30 years, most people begin to gradually lose bone because of the imbalance between bone loss and replacement. Some people lose more bone than others. Bone loss beyond a specified normal degree is considered osteoporosis.  Osteoporosis affects the strength and durability of your bones. The inside of the ends of your bones and your flat bones, like the bones of your pelvis, look like honeycomb, filled with tiny open spaces. As bone loss occurs, your bones become less dense. This means that the open spaces inside your bones become bigger and the walls between these spaces become thinner. This makes your bones weaker. Bones of a person with osteoporosis can become so weak that they can break (fracture) during minor accidents, such as a simple fall. CAUSES  The following factors have been associated with the development of osteoporosis:  Smoking.  Drinking more than 2 alcoholic drinks several days per week.  Long-term use of  certain medicines:  Corticosteroids.  Chemotherapy medicines.  Thyroid medicines.  Antiepileptic medicines.  Gonadal hormone suppression medicine.  Immunosuppression medicine.  Being underweight.  Lack of physical activity.  Lack of exposure to the sun. This can lead to vitamin D deficiency.  Certain medical conditions:  Certain inflammatory bowel diseases, such as Crohn's disease and ulcerative colitis.  Diabetes.  Hyperthyroidism.  Hyperparathyroidism. RISK FACTORS Anyone can develop osteoporosis. However, the following factors can increase your risk of developing osteoporosis:  Gender Women are at higher risk than men.  Age Being older than 50 years increases your risk.  Ethnicity White and Asian people have an increased risk.  Weight Being extremely underweight can increase your risk of osteoporosis.  Family history of osteoporosis Having a family member who has developed osteoporosis can increase your risk. SYMPTOMS  Usually, people with osteoporosis have no symptoms.  DIAGNOSIS  Signs during a physical exam that may prompt your caregiver to suspect osteoporosis include:  Decreased height. This is usually caused by the compression of the bones that form your spine (vertebrae) because they have weakened and become fractured.  A curving or rounding of the upper back (kyphosis). To confirm signs of osteoporosis, your caregiver may request a procedure that uses 2 low-dose X-ray beams with different levels of energy to  measure your bone mineral density (dual-energy X-ray absorptiometry [DXA]). Also, your caregiver may check your level of vitamin D. TREATMENT  The goal of osteoporosis treatment is to strengthen bones in order to decrease the risk of bone fractures. There are different types of medicines available to help achieve this goal. Some of these medicines work by slowing the processes of bone loss. Some medicines work by increasing bone density. Treatment  also involves making sure that your levels of calcium and vitamin D are adequate. PREVENTION  There are things you can do to help prevent osteoporosis. Adequate intake of calcium and vitamin D can help you achieve optimal bone mineral density. Regular exercise can also help, especially resistance and weight-bearing activities. If you smoke, quitting smoking is an important part of osteoporosis prevention. MAKE SURE YOU:  Understand these instructions.  Will watch your condition.  Will get help right away if you are not doing well or get worse. Document Released: 06/23/2005 Document Revised: 08/30/2012 Document Reviewed: 08/28/2011 [facility] Patient Information 2014 [place], [place]. Zoledronic Acid injection (Paget's Disease, Osteoporosis) What is this medicine? ZOLEDRONIC ACID (ZOE le dron ik AS id) lowers the amount of calcium loss from bone. It is used to treat Paget's disease and osteoporosis in women. This medicine may be used for other purposes; ask your health care provider or pharmacist if you have questions. What should I tell my health care provider before I take this medicine? They need to know if you have any of these conditions: -aspirin-sensitive asthma -dental disease -kidney disease -low levels of calcium in the blood -past surgery on the parathyroid gland or intestines -an unusual or allergic reaction to zoledronic acid, other medicines, foods, dyes, or preservatives -pregnant or trying to get pregnant -breast-feeding How should I use this medicine? This medicine is for infusion into a vein. It is given by a health care professional in a hospital or clinic setting. Talk to your pediatrician regarding the use of this medicine in children. This medicine is not approved for use in children. Overdosage: If you think you have taken too much of this medicine contact a poison control center or emergency room at once. NOTE: This medicine is only for you. Do not share this  medicine with others. What if I miss a dose? It is important not to miss your dose. Call your doctor or health care professional if you are unable to keep an appointment. What may interact with this medicine? -certain antibiotics given by injection -NSAIDs, medicines for pain and inflammation, like ibuprofen or naproxen -some diuretics like bumetanide, furosemide -teriparatide This list may not describe all possible interactions. Give your health care provider a list of all the medicines, herbs, non-prescription drugs, or dietary supplements you use. Also tell them if you smoke, drink alcohol, or use illegal drugs. Some items may interact with your medicine. What should I watch for while using this medicine? Visit your doctor or health care professional for regular checkups. It may be some time before you see the benefit from this medicine. Do not stop taking your medicine unless your doctor tells you to. Your doctor may order blood tests or other tests to see how you are doing. Women should inform their doctor if they wish to become pregnant or think they might be pregnant. There is a potential for serious side effects to an unborn child. Talk to your health care professional or pharmacist for more information. You should make sure that you get enough calcium and vitamin D while you  are taking this medicine. Discuss the foods you eat and the vitamins you take with your health care professional. Some people who take this medicine have severe bone, joint, and/or muscle pain. This medicine may also increase your risk for a broken thigh bone. Tell your doctor right away if you have pain in your upper leg or groin. Tell your doctor if you have any pain that does not go away or that gets worse. What side effects may I notice from receiving this medicine? Side effects that you should report to your doctor or health care professional as soon as possible: -allergic reactions like skin rash, itching or hives,  swelling of the face, lips, or tongue -breathing problems -changes in vision -feeling faint or lightheaded, falls -jaw burning, cramping, or pain -muscle cramps, stiffness, or weakness -trouble passing urine or change in the amount of urine Side effects that usually do not require medical attention (report to your doctor or health care professional if they continue or are bothersome): -bone, joint, or muscle pain -fever -irritation at site where injected -loss of appetite -nausea, vomiting -stomach upset -tired This list may not describe all possible side effects. Call your doctor for medical advice about side effects. You may report side effects to FDA at 1-800-FDA-1088. Where should I keep my medicine? This drug is given in a hospital or clinic and will not be stored at home. NOTE: This sheet is a summary. It may not cover all possible information. If you have questions about this medicine, talk to your doctor, pharmacist, or health care provider.  2013, Elsevier/Gold Standard. (03/12/2011 9:08:15 AM)

## 2013-06-19 NOTE — Progress Notes (Signed)
Kathryn [name] [birth year] [id number]   History:    79 y.o.  Complaining of frequency in urination as well as suprapubic discomfort and vaginal dryness. Review of patient's records indicated that she had previously taken Fosamax for 2 years and then stopped it. She was on Boniva for approximately 2 years and then stopped at about a year ago. She did this after her right knee replacement. She has also had her left hip replaced. She has had no fractures. She had a bone density this summer showing a worse T score of -2.4. She has lost 14.7% bone of her wrist, 3.1% bone loss of her spine, and 7.7% bone loss of her right hip. These numbers are since her last bone density. She had lab work through her PCP.  Patient has had a left hip and right knee replacement.She is having some hot flashes and vaginal dryness but feels they are tolerable without intervention. Last year she was prescribed once again the needle 150 mg monthly but she did not because she states it is too regimen until on her schedule and did not start.She just had a mammogram showing calcification in her left breast which was felt to be nonsuspicious. She is due for followup mammogram in 6 months.  Patient had a colonoscopy 2 years ago. Patient has had history of benign colonic polyps removed in the past.    Past medical history,surgical history, family history and social history were all reviewed and documented in the EPIC chart.  Gynecologic History No LMP recorded. Patient is postmenopausal. Contraception: post menopausal status Last Pap: 2010. Results were: normal Last mammogram: 2014. Results were: see above  Obstetric History OB History  Gravida Para Term Preterm AB SAB TAB Ectopic Multiple Living  1 1 1       1     # Outcome Date GA Lbr Len/2nd Weight Sex Delivery Anes PTL Lv  1 TRM                ROS: A ROS was performed and pertinent positives and negatives are included in the history.  GENERAL: No fevers or chills. HEENT:  No change in vision, no earache, sore throat or sinus congestion. NECK: No pain or stiffness. CARDIOVASCULAR: No chest pain or pressure. No palpitations. PULMONARY: No shortness of breath, cough or wheeze. GASTROINTESTINAL: suprapubic discomfort melena or bright red blood per rectum. GENITOURINARY:urinary frequency MUSCULOSKELETAL: No joint or muscle pain, no back pain, no recent trauma. DERMATOLOGIC: No rash, no itching, no lesions. ENDOCRINE: No polyuria, polydipsia, no heat or cold intolerance. No recent change in weight. HEMATOLOGICAL: No anemia or easy bruising or bleeding. NEUROLOGIC: No headache, seizures, numbness, tingling or weakness. PSYCHIATRIC: No depression, no loss of interest in normal activity or change in sleep pattern.     Exam: chaperone present  BP 130/82  Ht 5' 7.25" (1.708 m)  Wt 138 lb 12.8 oz (62.959 kg)  BMI 21.58 kg/m2  Body mass index is 21.58 kg/(m^2).  General appearance : Well developed well nourished female. No acute distress HEENT: Neck supple, trachea midline, no carotid bruits, no thyroidmegaly Lungs: Clear to auscultation, no rhonchi or wheezes, or rib retractions  Heart: Regular rate and rhythm, no murmurs or gallops Breast:Examined in sitting and supine position were symmetrical in appearance, no palpable masses or tenderness,  no skin retraction, no nipple inversion, no nipple discharge, no skin discoloration, no axillary or supraclavicular lymphadenopathy Abdomen: no palpable masses or tenderness, no rebound or guarding Extremities: no edema or skin discoloration  or tenderness  Pelvic:  Bartholin, Urethra, Skene Glands: Within normal limits             Vagina: No gross lesions or discharge,atrophic changes Cervix: No gross lesions or discharge  Uterus  anteverted, normal size, shape and consistency, non-tender and mobile  Adnexa  Without masses or tenderness  Anus and perineum  normal   Rectovaginal  normal sphincter tone without palpated masses or  tenderness             Hemoccult card provided     Assessment/Plan:  79 y.o. female with severe osteopenia poorly compliant on oral bisphosphonate. Patient has had a left hip and right knee replacement. It was recommended the patient consider intravenous bisphosphonate such as Reclast once a year IV infusion. Literature and information was provided. Risks benefits and pros and cons were discussed. We will check today a calcium, vitamin D, PTH, BUN and creatinine.she was reminded there are monthly self breast exam. We discussed importance of calcium and vitamin D in regular exercise. Prescription for shingles vaccine was provided. Her primary physician Dr. Kerby Nora will be drawn and the rest of her lab work. Pap smear not done today intercourse to nearby lines. Her urinalysis was negative samples submitted for culture. Because her vaginal atrophy she is going to be prescribed estradiol vaginal cream to apply twice a week to alleviate some of the discomfort. We'll wait for the results of the urine culture.    Ok Edwards MD, 1:59 PM 06/19/2013

## 2013-06-21 ENCOUNTER — Other Ambulatory Visit: Payer: Self-pay | Admitting: *Deleted

## 2013-06-21 MED ORDER — NITROFURANTOIN MONOHYD MACRO 100 MG PO CAPS: 100.0000 mg | ORAL_CAPSULE | Freq: Two times a day (BID) | ORAL | Status: DC

## 2013-06-22 ENCOUNTER — Telehealth: Payer: Self-pay | Admitting: *Deleted

## 2013-06-22 LAB — URINE CULTURE: Colony Count: >=100000

## 2013-06-22 MED ORDER — CIPROFLOXACIN HCL 250 MG PO TABS: 250.0000 mg | ORAL_TABLET | Freq: Two times a day (BID) | ORAL | Status: DC

## 2013-06-22 NOTE — Telephone Encounter (Signed)
Tell her to stop the Macrobid and call in  prescription for Cipro 250 mg one by mouth twice a day for 3 days #6

## 2013-06-22 NOTE — Telephone Encounter (Signed)
Pt informed with the below note, rx sent. 

## 2013-06-22 NOTE — Telephone Encounter (Signed)
Pt was prescribed Macrobid 100 mg took for 2 days now and c/o upset stomach with medication, c/o feeling shaky and cold while on Rx. Pt would like another rx? Please advise

## 2013-07-10 ENCOUNTER — Telehealth: Payer: Self-pay | Admitting: *Deleted

## 2013-07-10 MED ORDER — RISEDRONATE SODIUM 150 MG PO TABS: 150.0000 mg | ORAL_TABLET | ORAL | Status: DC

## 2013-07-10 NOTE — Telephone Encounter (Signed)
Pt informed that Reclast isnt indicated for Osteopenia, only osteoporosis. Therefore Dr Lily Peer advised to do Actonel 150mg  montly. Rx sent to pts pharmacy. KW CMA

## 2013-08-14 ENCOUNTER — Ambulatory Visit (INDEPENDENT_AMBULATORY_CARE_PROVIDER_SITE_OTHER): Payer: Medicare Other

## 2013-08-14 DIAGNOSIS — Z23 Encounter for immunization: Secondary | ICD-10-CM | POA: Diagnosis not present

## 2013-08-28 ENCOUNTER — Encounter: Payer: Self-pay | Admitting: Internal Medicine

## 2013-09-25 ENCOUNTER — Other Ambulatory Visit: Payer: Self-pay | Admitting: Family Medicine

## 2013-09-25 NOTE — Telephone Encounter (Signed)
Last office visit 11/28/2012.  Ok to refill?

## 2013-11-08 ENCOUNTER — Telehealth: Payer: Self-pay | Admitting: Family Medicine

## 2013-11-08 DIAGNOSIS — Z1231 Encounter for screening mammogram for malignant neoplasm of breast: Secondary | ICD-10-CM

## 2013-11-08 NOTE — Telephone Encounter (Signed)
Will print out mammogram order and fax to [facility] just in case.

## 2013-11-08 NOTE — Telephone Encounter (Signed)
Order faxed to [place].  Left message for Lanaysia that order has been faxed to [facility].

## 2013-11-08 NOTE — Telephone Encounter (Signed)
Pt is saying that [employer] health wants her to call in and get referral for the 3D mammo. Please advise.

## 2013-11-08 NOTE — Telephone Encounter (Signed)
I don't think pt needs a special referral for 3D. But I made note in my referral.

## 2013-12-04 DIAGNOSIS — Z09 Encounter for follow-up examination after completed treatment for conditions other than malignant neoplasm: Secondary | ICD-10-CM | POA: Diagnosis not present

## 2013-12-04 DIAGNOSIS — R928 Other abnormal and inconclusive findings on diagnostic imaging of breast: Secondary | ICD-10-CM | POA: Diagnosis not present

## 2013-12-05 ENCOUNTER — Encounter: Payer: Self-pay | Admitting: Family Medicine

## 2013-12-18 DIAGNOSIS — Z96649 Presence of unspecified artificial hip joint: Secondary | ICD-10-CM | POA: Diagnosis not present

## 2013-12-18 DIAGNOSIS — IMO0002 Reserved for concepts with insufficient information to code with codable children: Secondary | ICD-10-CM | POA: Diagnosis not present

## 2013-12-18 DIAGNOSIS — Z96659 Presence of unspecified artificial knee joint: Secondary | ICD-10-CM | POA: Diagnosis not present

## 2013-12-18 DIAGNOSIS — M1710 Unilateral primary osteoarthritis, unspecified knee: Secondary | ICD-10-CM | POA: Diagnosis not present

## 2013-12-25 ENCOUNTER — Other Ambulatory Visit: Payer: Self-pay | Admitting: Family Medicine

## 2014-01-14 ENCOUNTER — Other Ambulatory Visit: Payer: Self-pay | Admitting: *Deleted

## 2014-01-14 MED ORDER — CETIRIZINE HCL 10 MG PO TABS: ORAL_TABLET | ORAL | Status: DC

## 2014-01-21 ENCOUNTER — Telehealth: Payer: Self-pay | Admitting: Family Medicine

## 2014-01-21 NOTE — Telephone Encounter (Signed)
Patient Information:  Caller Name: Catriona  Phone: [phone]  Patient: Kathryn [name], Kathryn [name]  Gender: Female  DOB: [birth year]  Age: 80 Years  PCP: Eliezer Lofts (Family Practice)  Office Follow Up:  Does the office need to follow up with this patient?: No  Instructions For The Office: N/A  RN Note:  Onset of pain under right shoulder pain 01/17/14.  States by 01/19/14 she could not feel the fingers of her right hand, and she found it extremely painful to do routine care, like brushing her hair.  States she is unable to grip the curling iron or a coffee mug.  Per shoulder pain protocol, advised appt within 24 hours due to "weakness in hand or fingers."  Appt scheduled 01/22/14 0745 with Dr. Diona Browner.  krs/can  Symptoms  Reason For Call & Symptoms: right shoulder pain  Reviewed Health History In EMR: Yes  Reviewed Medications In EMR: Yes  Reviewed Allergies In EMR: Yes  Reviewed Surgeries / Procedures: Yes  Date of Onset of Symptoms: 01/17/2014  Guideline(s) Used:  Shoulder Pain  Disposition Per Guideline:   See Today in Office  Reason For Disposition Reached:   Weakness (i.e., loss of strength) in hand or fingers (Exception: not truly weak; hand feels weak because of pain)  Advice Given:  N/A  Patient Will Follow Care Advice:  YES  Appointment Scheduled:  01/22/2014 07:45:00 Appointment Scheduled Provider:  Eliezer Lofts (Family Practice)

## 2014-01-21 NOTE — Telephone Encounter (Signed)
Noted  

## 2014-01-22 ENCOUNTER — Ambulatory Visit (INDEPENDENT_AMBULATORY_CARE_PROVIDER_SITE_OTHER): Payer: Medicare Other | Admitting: Family Medicine

## 2014-01-22 ENCOUNTER — Encounter: Payer: Self-pay | Admitting: Family Medicine

## 2014-01-22 ENCOUNTER — Ambulatory Visit (INDEPENDENT_AMBULATORY_CARE_PROVIDER_SITE_OTHER)
Admission: RE | Admit: 2014-01-22 | Discharge: 2014-01-22 | Disposition: A | Discharge status: Home / Self Care | Payer: Medicare Other | Source: Ambulatory Visit | Attending: Family Medicine | Admitting: Family Medicine

## 2014-01-22 VITALS — BP 138/86 | HR 64 | Temp 98.2°F | Ht 67.25 in | Wt 131.5 lb

## 2014-01-22 DIAGNOSIS — M25511 Pain in right shoulder: Secondary | ICD-10-CM

## 2014-01-22 DIAGNOSIS — M47812 Spondylosis without myelopathy or radiculopathy, cervical region: Secondary | ICD-10-CM | POA: Diagnosis not present

## 2014-01-22 DIAGNOSIS — M25519 Pain in unspecified shoulder: Secondary | ICD-10-CM | POA: Diagnosis not present

## 2014-01-22 DIAGNOSIS — R002 Palpitations: Secondary | ICD-10-CM | POA: Diagnosis not present

## 2014-01-22 MED ORDER — MELOXICAM 15 MG PO TABS: 15.0000 mg | ORAL_TABLET | Freq: Every day | ORAL | Status: DC

## 2014-01-22 NOTE — Patient Instructions (Addendum)
Stop diclofenac, change to meloxicam daily.  Stop at lab on way out for X-ray of neck. Start home physical therapy of upper back. Follow up in 2 weeks, call sooner if worsening weakness in hand or new symptoms.

## 2014-01-22 NOTE — Assessment & Plan Note (Signed)
Pain mainly uner shoulder blade but concerning for nerve impingement given decrease grip strength. Spurling was negative, biut difficult to perform given arthroitis cahnge. Will check X-ray neck and  cahnge to meloxicam.  Start home PT.  Follow up in 2 weeks.

## 2014-01-22 NOTE — Progress Notes (Signed)
Pre visit review using our clinic review tool, if applicable. No additional management support is needed unless otherwise documented below in the visit note. 

## 2014-01-22 NOTE — Progress Notes (Signed)
Subjective:    Patient ID: Kathryn [name], female    DOB: 1931-03-14, 80 y.o.   MRN: [id number]  Shoulder Pain    80 year old female presents with 5 days of intermittent pain of right shoulder blade pain, also pain radiating to right upper arm.  No numbness.  No neck pain  Current ly, has had some in past that Improved with home PT and NSAIDs last year.  Decreased in right grip strength in last 3 days.  No change with movement. No recent fall or injury.  She did have episode of fast heart rate for 30 min 6 days  When she first started with the pain. ago and 4 days ago. Some mild SOB, no chest pain.   She has been using tylenol for pain.  Has hereditary dupuytren contracture on right hand, 5th digit and long standing numbness in some fingers.  No surgical history of shoulder or neck.  No past X-ray of shoudler or neck.  She is on diclofenac twice a day for arthritis.   Review of Systems     Objective:   Physical Exam  Constitutional: Vital signs are normal. She appears well-developed and well-nourished. She is cooperative.  Non-toxic appearance. She does not appear ill. No distress.  HENT:  Head: Normocephalic.  Right Ear: Hearing, tympanic membrane, external ear and ear canal normal. Tympanic membrane is not erythematous, not retracted and not bulging.  Left Ear: Hearing, tympanic membrane, external ear and ear canal normal. Tympanic membrane is not erythematous, not retracted and not bulging.  Nose: No mucosal edema or rhinorrhea. Right sinus exhibits no maxillary sinus tenderness and no frontal sinus tenderness. Left sinus exhibits no maxillary sinus tenderness and no frontal sinus tenderness.  Mouth/Throat: Uvula is midline, oropharynx is clear and moist and mucous membranes are normal.  Eyes: Conjunctivae, EOM and lids are normal. Pupils are equal, round, and reactive to light. Lids are everted and swept, no foreign bodies found.  Neck: Trachea normal and normal range of  motion. Neck supple. Carotid bruit is not present. No mass and no thyromegaly present.  Cardiovascular: Normal rate, regular rhythm, S1 normal, S2 normal, normal heart sounds, intact distal pulses and normal pulses.  Exam reveals no gallop and no friction rub.   No murmur heard. Pulmonary/Chest: Effort normal and breath sounds normal. Not tachypneic. No respiratory distress. She has no decreased breath sounds. She has no wheezes. She has no rhonchi. She has no rales.  Abdominal: Soft. Normal appearance and bowel sounds are normal. There is no tenderness.  Musculoskeletal:       Right shoulder: She exhibits tenderness. She exhibits normal range of motion, no bony tenderness, no swelling and no spasm.       Cervical back: She exhibits decreased range of motion. She exhibits no tenderness and no bony tenderness.       Thoracic back: She exhibits tenderness.  Diffuse arthritis and decrease in ROM in neck,wrist and fingers,  Deformity in fingers B, contracture in 5th digit B hand  Neg spurling, but difficult to test given decrease ROM.      Neurological: She is alert. She displays no atrophy. No cranial nerve deficit or sensory deficit. She exhibits normal muscle tone.  Decreased grip strength mildly on right, no other strength issues.  Skin: Skin is warm, dry and intact. No rash noted.  Psychiatric: Her speech is normal and behavior is normal. Judgment and thought content normal. Her mood appears not anxious. Cognition and memory  are normal. She does not exhibit a depressed mood.          Assessment & Plan:

## 2014-01-22 NOTE — Assessment & Plan Note (Signed)
EKG stable.. ? Due to pain

## 2014-02-05 ENCOUNTER — Telehealth: Payer: Self-pay

## 2014-02-05 DIAGNOSIS — M5090 Cervical disc disorder, unspecified, unspecified cervical region: Secondary | ICD-10-CM

## 2014-02-05 DIAGNOSIS — M25511 Pain in right shoulder: Secondary | ICD-10-CM

## 2014-02-05 NOTE — Telephone Encounter (Signed)
See below

## 2014-02-05 NOTE — Telephone Encounter (Signed)
Pt left v/m; pt was seen on 01/22/14 and had xray; pt's shoulder feels better on and off but today even with medication shoulder has hurt all day and there is still weakness in rt hand. Please advise. Pt request cb.

## 2014-02-05 NOTE — Telephone Encounter (Signed)
[name] notified as instructed by telephone.  Please schedule MRI on a Tuesday per patient's request.

## 2014-02-05 NOTE — Telephone Encounter (Signed)
Notify pt given continue pain recommend eval with MRI Cspine referral sent in.

## 2014-02-12 ENCOUNTER — Ambulatory Visit
Admission: RE | Admit: 2014-02-12 | Discharge: 2014-02-12 | Disposition: A | Discharge status: Home / Self Care | Payer: Medicare Other | Source: Ambulatory Visit | Attending: Family Medicine | Admitting: Family Medicine

## 2014-02-12 ENCOUNTER — Telehealth: Payer: Self-pay

## 2014-02-12 DIAGNOSIS — M25511 Pain in right shoulder: Secondary | ICD-10-CM

## 2014-02-12 DIAGNOSIS — M47812 Spondylosis without myelopathy or radiculopathy, cervical region: Secondary | ICD-10-CM | POA: Diagnosis not present

## 2014-02-12 DIAGNOSIS — M5090 Cervical disc disorder, unspecified, unspecified cervical region: Secondary | ICD-10-CM

## 2014-02-12 MED ORDER — TRAMADOL HCL 50 MG PO TABS: 50.0000 mg | ORAL_TABLET | Freq: Four times a day (QID) | ORAL | Status: DC | PRN

## 2014-02-12 MED ORDER — PREDNISONE 20 MG PO TABS: ORAL_TABLET | ORAL | Status: DC

## 2014-02-12 NOTE — Telephone Encounter (Signed)
Patient advised. Medication phoned to pharmacy. Patient advised of MRI results and will contact us later about steroid injections.

## 2014-02-12 NOTE — Telephone Encounter (Signed)
Pt left v/m; pt is requesting pain medication for shoulder pain; pt has not been able to sleep for 3 nights due to shoulder pain. Pt request cb.[facility].

## 2014-02-12 NOTE — Telephone Encounter (Signed)
Stop meloxicam or diclofenac.  Start prednisone taper to start in morning. Call in Rx for pain medication.   Also see MRI note and report to pt results.

## 2014-02-19 ENCOUNTER — Other Ambulatory Visit: Payer: Self-pay | Admitting: Family Medicine

## 2014-02-20 NOTE — Telephone Encounter (Signed)
Last office visit 01/22/2014.  Last refilled 09/25/2013 for #90 with 1 refill.  Ok to refill?

## 2014-03-05 ENCOUNTER — Encounter: Payer: Self-pay | Admitting: Internal Medicine

## 2014-03-21 ENCOUNTER — Other Ambulatory Visit: Payer: Self-pay | Admitting: *Deleted

## 2014-03-21 NOTE — Telephone Encounter (Signed)
Received faxed refill request from [facility] for Diclofenac 75 mg tablets.  Requesting 90 day supply.  Not on current medication list but is in her medication history.  Ok to refill?

## 2014-03-22 MED ORDER — DICLOFENAC SODIUM 75 MG PO TBEC: 75.0000 mg | DELAYED_RELEASE_TABLET | Freq: Two times a day (BID) | ORAL | Status: DC

## 2014-04-08 ENCOUNTER — Other Ambulatory Visit: Payer: Self-pay | Admitting: Family Medicine

## 2014-04-09 ENCOUNTER — Encounter: Payer: Self-pay | Admitting: Family Medicine

## 2014-04-09 ENCOUNTER — Ambulatory Visit (INDEPENDENT_AMBULATORY_CARE_PROVIDER_SITE_OTHER): Payer: Medicare Other | Admitting: Family Medicine

## 2014-04-09 VITALS — BP 107/77 | HR 56 | Temp 97.6°F | Ht 68.0 in | Wt 139.2 lb

## 2014-04-09 DIAGNOSIS — I499 Cardiac arrhythmia, unspecified: Secondary | ICD-10-CM | POA: Diagnosis not present

## 2014-04-09 DIAGNOSIS — J309 Allergic rhinitis, unspecified: Secondary | ICD-10-CM | POA: Diagnosis not present

## 2014-04-09 DIAGNOSIS — E538 Deficiency of other specified B group vitamins: Secondary | ICD-10-CM | POA: Diagnosis not present

## 2014-04-09 DIAGNOSIS — Z0000 Encounter for general adult medical examination without abnormal findings: Secondary | ICD-10-CM | POA: Diagnosis not present

## 2014-04-09 DIAGNOSIS — Z23 Encounter for immunization: Secondary | ICD-10-CM

## 2014-04-09 DIAGNOSIS — E785 Hyperlipidemia, unspecified: Secondary | ICD-10-CM

## 2014-04-09 DIAGNOSIS — M899 Disorder of bone, unspecified: Secondary | ICD-10-CM | POA: Diagnosis not present

## 2014-04-09 DIAGNOSIS — M949 Disorder of cartilage, unspecified: Secondary | ICD-10-CM

## 2014-04-09 MED ORDER — AZELASTINE HCL 0.1 % NA SOLN: 2.0000 | Freq: Two times a day (BID) | NASAL | Status: DC

## 2014-04-09 NOTE — Addendum Note (Signed)
Addended by: Carter Kitten on: 04/09/2014 12:14 PM   Modules accepted: Orders

## 2014-04-09 NOTE — Addendum Note (Signed)
Addended by: [name] on: 04/09/2014 12:37 PM   Modules accepted: Orders

## 2014-04-09 NOTE — Assessment & Plan Note (Signed)
Trail of astelin as main symptoms is runny nose.

## 2014-04-09 NOTE — Progress Notes (Signed)
Pre visit review using our clinic review tool, if applicable. No additional management support is needed unless otherwise documented below in the visit note. 

## 2014-04-09 NOTE — Patient Instructions (Addendum)
Call to schedule colonoscopy with Dr. Henrene Pastor. Stop at front desk to schedule bone density.  Stop at lab on way out.  Start trial of astelin for allergies.

## 2014-04-09 NOTE — Progress Notes (Addendum)
HPI  I have personally reviewed the Medicare Annual Wellness questionnaire and have noted  1. The patient's medical and social history  2. Their use of alcohol, tobacco or illicit drugs  3. Their current medications and supplements  4. The patient's functional ability including ADL's, fall risks, home safety risks and hearing or visual  impairment.  5. Diet and physical activities  6. Evidence for depression or mood disorders  The patients weight, height, BMI and visual acuity have been recorded in the chart  I have made referrals, counseling and provided education to the patient based review of the above and I have provided the pt with a written personalized care plan for preventive services.   Her shoulder is better.  Elevated Cholesterol: Due for re-eval... On zocor 40 mg daily  Lab Results  Component Value Date   CHOL 190 04/04/2012   HDL 60.30 04/04/2012   LDLCALC 114* 04/04/2012   LDLDIRECT 116.9 09/25/2008   TRIG 81.0 04/04/2012   CHOLHDL 3 04/04/2012  Using medications without problems:  Muscle aches: None  Diet compliance:Good  Exercise:Occ  Other complaints:   Osteopenia, worsened last check: on no med, previously on boniva  History   Social History  . Marital Status: Married    Spouse Name: N/A    Number of Children: N/A  . Years of Education: N/A   Social History Main Topics  . Smoking status: Never Smoker   . Smokeless tobacco: Never Used  . Alcohol Use: 1.5 oz/week    3 drink(s) per week  . Drug Use: No  . Sexual Activity: No   Other Topics Concern  . None   Social History Narrative   Regular exercise-- yes, 3-4 times a week      Diet: limited water, some fruit and veggies      Full code, has living will, HCPOA, son Kathryn [name] ( reviewed 2015)    Review of Systems  Constitutional: Negative for fever, fatigue and unexpected weight change.  HENT: Positive for rhinorrhea. Negative for ear pain, congestion, sore throat, sneezing, trouble swallowing and  sinus pressure.  Eyes: Negative for pain and itching.  Respiratory: Negative for cough, shortness of breath and wheezing.  Cardiovascular: Negative for chest pain, palpitations and leg swelling.  Gastrointestinal: Negative for nausea, abdominal pain, diarrhea, constipation and blood in stool.  Genitourinary: Negative for dysuria, hematuria, vaginal discharge, difficulty urinating and menstrual problem.  Skin: Negative for rash.  Neurological: Negative for syncope, weakness, light-headedness, numbness and headaches.  Psychiatric/Behavioral: Negative for confusion and dysphoric mood. The patient is not nervous/anxious.  Objective:   Physical Exam  Constitutional: Vital signs are normal. She appears well-developed and well-nourished. She is cooperative. Non-toxic appearance. She does not appear ill. No distress.  HENT:  Head: Normocephalic.  Right Ear: Hearing, tympanic membrane, external ear and ear canal normal.  Left Ear: Hearing, tympanic membrane, external ear and ear canal normal.  Nose: Nose normal.  Eyes: Conjunctivae, EOM and lids are normal. Pupils are equal, round, and reactive to light. No foreign bodies found.  Neck: Trachea normal and normal range of motion. Neck supple. Carotid bruit is not present. No mass and no thyromegaly present.  Cardiovascular: Normal rate, IRRegular rhythm, S1 normal, S2 normal, normal heart sounds and intact distal pulses. Exam reveals no gallop.  No murmur. Pulmonary/Chest: Effort normal and breath sounds normal. No respiratory distress. She has no wheezes. She has no rhonchi. She has no rales.  Abdominal: Soft. Normal appearance and bowel sounds are  normal. She exhibits no distension, no fluid wave, no abdominal bruit and no mass. There is no hepatosplenomegaly. There is no tenderness. There is no rebound, no guarding and no CVA tenderness. No hernia.  Genitourinary: No breast swelling, tenderness, discharge or bleeding. Pelvic exam was performed with  patient prone.  No pap indicated  Lymphadenopathy:  She has no cervical adenopathy.  She has no axillary adenopathy.  Neurological: She is alert. She has normal strength. No cranial nerve deficit or sensory deficit.  Skin: Skin is warm, dry and intact. No rash noted.  Psychiatric: Her speech is normal and behavior is normal. Judgment normal. Her mood appears not anxious. Cognition and memory are normal. She does not exhibit a depressed mood.  Assessment & Plan:   The patient's preventative maintenance and recommended screening tests for an annual wellness exam were reviewed in full today.  Brought up to date unless services declined.  Counselled on the importance of diet, exercise, and its role in overall health and mortality.  The patient's FH and SH was reviewed, including their home life, tobacco status, and drug and alcohol status.   Nonsmoker  PNA,TD,  and  Shingles uptodate due for prevnar Colon: Dr. Henrene Pastor, 2 polyps, repeat due in 5 years (2015)  DEXA: 2013 worsened osteopenia, on no med. Mammogram: 11/2013 nml DVE/PAP: no indication due to age.

## 2014-04-09 NOTE — Assessment & Plan Note (Signed)
Irregular heart rate from frequent PACs. Pt asymptomatic. Also has stable 1st degree heart block.

## 2014-04-10 ENCOUNTER — Encounter: Payer: Self-pay | Admitting: Internal Medicine

## 2014-04-10 ENCOUNTER — Telehealth: Payer: Self-pay

## 2014-04-10 LAB — COMPREHENSIVE METABOLIC PANEL
ALT: 41 U/L — ABNORMAL HIGH (ref 0–35)
AST: 32 U/L (ref 0–37)
Albumin: 4 g/dL (ref 3.5–5.2)
Alkaline Phosphatase: 72 U/L (ref 39–117)
BUN: 26 mg/dL — AB (ref 6–23)
CALCIUM: 9.5 mg/dL (ref 8.4–10.5)
CHLORIDE: 109 meq/L (ref 96–112)
CO2: 25 mEq/L (ref 19–32)
Creatinine, Ser: 0.9 mg/dL (ref 0.4–1.2)
GFR: 60.79 mL/min (ref >60.00)
GLUCOSE: 87 mg/dL (ref 70–99)
Potassium: 5.4 mEq/L — ABNORMAL HIGH (ref 3.5–5.1)
SODIUM: 140 meq/L (ref 135–145)
Total Bilirubin: 0.6 mg/dL (ref 0.2–1.2)
Total Protein: 7.2 g/dL (ref 6.0–8.3)

## 2014-04-10 LAB — LIPID PANEL
CHOL/HDL RATIO: 3
CHOLESTEROL: 154 mg/dL (ref 0–200)
HDL: 52.8 mg/dL (ref >39.00)
LDL Cholesterol: 84 mg/dL (ref 0–99)
NonHDL: 101.2
TRIGLYCERIDES: 87 mg/dL (ref 0.0–149.0)
VLDL: 17.4 mg/dL (ref 0.0–40.0)

## 2014-04-10 LAB — VITAMIN D 25 HYDROXY (VIT D DEFICIENCY, FRACTURES): VITD: 74.69 ng/mL

## 2014-04-10 LAB — VITAMIN B12: VITAMIN B 12: 171 pg/mL — AB (ref 211–911)

## 2014-04-10 MED ORDER — SIMVASTATIN 40 MG PO TABS: 40.0000 mg | ORAL_TABLET | Freq: Every day | ORAL | Status: DC

## 2014-04-10 NOTE — Telephone Encounter (Signed)
[facility] left v/m requesting cb ref # [id number]. [facility]ed [facility] spoke with Jay;requesting refill simvastatin. Refills done. 04/09/14 labs looked OK so I did refill Simvastatin 40 mg.

## 2014-05-06 ENCOUNTER — Telehealth (INDEPENDENT_AMBULATORY_CARE_PROVIDER_SITE_OTHER): Payer: Medicare Other | Admitting: Family Medicine

## 2014-05-06 DIAGNOSIS — E876 Hypokalemia: Secondary | ICD-10-CM | POA: Diagnosis not present

## 2014-05-06 NOTE — Telephone Encounter (Signed)
Message copied by Jinny Sanders on Mon May 06, 2014 10:28 PM ------      Message from: [name]      Created: Mon Apr 29, 2014 11:50 AM      Regarding: Lab orders for Tuesday, 8.11.15       Lab orders, no f/u appt ------

## 2014-05-07 ENCOUNTER — Encounter: Payer: Self-pay | Admitting: Radiology

## 2014-05-07 ENCOUNTER — Other Ambulatory Visit (INDEPENDENT_AMBULATORY_CARE_PROVIDER_SITE_OTHER): Payer: Medicare Other

## 2014-05-07 DIAGNOSIS — Z0000 Encounter for general adult medical examination without abnormal findings: Secondary | ICD-10-CM | POA: Diagnosis not present

## 2014-05-07 DIAGNOSIS — Z79899 Other long term (current) drug therapy: Secondary | ICD-10-CM | POA: Diagnosis not present

## 2014-05-07 LAB — POTASSIUM: POTASSIUM: 4.9 meq/L (ref 3.5–5.1)

## 2014-05-08 ENCOUNTER — Encounter: Payer: Self-pay | Admitting: *Deleted

## 2014-06-01 ENCOUNTER — Other Ambulatory Visit: Payer: Self-pay | Admitting: Family Medicine

## 2014-06-04 DIAGNOSIS — M899 Disorder of bone, unspecified: Secondary | ICD-10-CM | POA: Diagnosis not present

## 2014-06-04 DIAGNOSIS — N63 Unspecified lump in unspecified breast: Secondary | ICD-10-CM | POA: Diagnosis not present

## 2014-06-04 DIAGNOSIS — M949 Disorder of cartilage, unspecified: Secondary | ICD-10-CM | POA: Diagnosis not present

## 2014-06-04 DIAGNOSIS — Z09 Encounter for follow-up examination after completed treatment for conditions other than malignant neoplasm: Secondary | ICD-10-CM | POA: Diagnosis not present

## 2014-06-05 ENCOUNTER — Encounter: Payer: Self-pay | Admitting: Family Medicine

## 2014-06-06 ENCOUNTER — Encounter: Payer: Self-pay | Admitting: Internal Medicine

## 2014-06-06 ENCOUNTER — Encounter: Payer: Self-pay | Admitting: Gastroenterology

## 2014-06-11 ENCOUNTER — Other Ambulatory Visit: Payer: Self-pay | Admitting: Radiology

## 2014-06-11 ENCOUNTER — Encounter: Payer: Self-pay | Admitting: Family Medicine

## 2014-06-11 DIAGNOSIS — R928 Other abnormal and inconclusive findings on diagnostic imaging of breast: Secondary | ICD-10-CM | POA: Diagnosis not present

## 2014-06-11 DIAGNOSIS — Z7189 Other specified counseling: Secondary | ICD-10-CM | POA: Diagnosis not present

## 2014-06-11 DIAGNOSIS — N6019 Diffuse cystic mastopathy of unspecified breast: Secondary | ICD-10-CM | POA: Diagnosis not present

## 2014-06-14 ENCOUNTER — Encounter: Payer: Self-pay | Admitting: Family Medicine

## 2014-06-18 ENCOUNTER — Encounter: Payer: Self-pay | Admitting: Family Medicine

## 2014-06-18 ENCOUNTER — Ambulatory Visit (INDEPENDENT_AMBULATORY_CARE_PROVIDER_SITE_OTHER): Payer: Medicare Other | Admitting: Family Medicine

## 2014-06-18 VITALS — BP 100/70 | HR 63 | Temp 97.8°F | Ht 68.0 in | Wt 140.2 lb

## 2014-06-18 DIAGNOSIS — M1990 Unspecified osteoarthritis, unspecified site: Secondary | ICD-10-CM

## 2014-06-18 DIAGNOSIS — M810 Age-related osteoporosis without current pathological fracture: Secondary | ICD-10-CM | POA: Diagnosis not present

## 2014-06-18 MED ORDER — MELOXICAM 15 MG PO TABS: 15.0000 mg | ORAL_TABLET | Freq: Every day | ORAL | Status: DC

## 2014-06-18 NOTE — Progress Notes (Signed)
   Subjective:    Patient ID: Kathryn [name], female    DOB: 1931-04-27, 80 y.o.   MRN: [id number]  HPI  80 year old female presents to discuss bone density results.   Recent bone density shows worsened significantly.  T score went from -2.4 osteopenia to -3.2 in radius Severe osteoporosis. Osteopenia -1.7 in spine Osteoporosis-2.8 in hip   She currently take ca and vit D once a day.  She was on boniva and fosamax in past for 2-3 years, no SE.  She has severe osteoarthritis in B hands. Stiffness, deformity and pain in B PIP and DIP.  Diclofenac has not helped much.  Tylenol does not help. Never tried meloxicam or celebrex. Review of Systems  Constitutional: Negative for fever and fatigue.  HENT: Negative for ear pain.   Eyes: Negative for pain.  Respiratory: Negative for chest tightness and shortness of breath.   Cardiovascular: Negative for chest pain, palpitations and leg swelling.  Gastrointestinal: Negative for abdominal pain.  Genitourinary: Negative for dysuria.       Objective:   Physical Exam  Constitutional: Vital signs are normal. She appears well-developed and well-nourished. She is cooperative.  Non-toxic appearance. She does not appear ill. No distress.  HENT:  Head: Normocephalic.  Right Ear: Hearing, tympanic membrane, external ear and ear canal normal. Tympanic membrane is not erythematous, not retracted and not bulging.  Left Ear: Hearing, tympanic membrane, external ear and ear canal normal. Tympanic membrane is not erythematous, not retracted and not bulging.  Nose: No mucosal edema or rhinorrhea. Right sinus exhibits no maxillary sinus tenderness and no frontal sinus tenderness. Left sinus exhibits no maxillary sinus tenderness and no frontal sinus tenderness.  Mouth/Throat: Uvula is midline, oropharynx is clear and moist and mucous membranes are normal.  Eyes: Conjunctivae, EOM and lids are normal. Pupils are equal, round, and reactive to light. Lids are  everted and swept, no foreign bodies found.  Neck: Trachea normal and normal range of motion. Neck supple. Carotid bruit is not present. No mass and no thyromegaly present.  Cardiovascular: Normal rate, regular rhythm, S1 normal, S2 normal, normal heart sounds, intact distal pulses and normal pulses.  Exam reveals no gallop and no friction rub.   No murmur heard. Pulmonary/Chest: Effort normal and breath sounds normal. Not tachypneic. No respiratory distress. She has no decreased breath sounds. She has no wheezes. She has no rhonchi. She has no rales.  Abdominal: Soft. Normal appearance and bowel sounds are normal. There is no tenderness.  Musculoskeletal:  Deformity fro OA of DIP and PIP joints B henads, no redness,  DIP and PIP swelling  Neurological: She is alert.  Skin: Skin is warm, dry and intact. No rash noted.  Psychiatric: Her speech is normal and behavior is normal. Judgment and thought content normal. Her mood appears not anxious. Cognition and memory are normal. She does not exhibit a depressed mood.          Assessment & Plan:

## 2014-06-18 NOTE — Progress Notes (Signed)
Pre visit review using our clinic review tool, if applicable. No additional management support is needed unless otherwise documented below in the visit note. 

## 2014-06-18 NOTE — Assessment & Plan Note (Signed)
Stop diclofenac, trial of meloxicam. If not improving consider celebrex.

## 2014-06-18 NOTE — Patient Instructions (Addendum)
Calcium 600 mg twice daily, Vit D 3 400 IU twice daily. Increase exercise as able. We will plan to recheck bone density in 1-2 years.  Trial of meloxicam for hand arthritis.

## 2014-06-18 NOTE — Assessment & Plan Note (Addendum)
Reviewed treatment options in detail with pt. She refuses additional med. She will increase ca and continue vit D and weight bearing exercise. Plan recheck in 1-2 years.

## 2014-06-27 ENCOUNTER — Ambulatory Visit: Payer: Medicare Other | Admitting: Internal Medicine

## 2014-07-18 ENCOUNTER — Ambulatory Visit (INDEPENDENT_AMBULATORY_CARE_PROVIDER_SITE_OTHER): Payer: Medicare Other

## 2014-07-18 DIAGNOSIS — Z23 Encounter for immunization: Secondary | ICD-10-CM | POA: Diagnosis not present

## 2014-08-13 ENCOUNTER — Other Ambulatory Visit: Payer: Self-pay | Admitting: Family Medicine

## 2014-09-17 ENCOUNTER — Ambulatory Visit: Payer: Medicare Other | Admitting: Internal Medicine

## 2014-10-24 ENCOUNTER — Other Ambulatory Visit: Payer: Self-pay | Admitting: Family Medicine

## 2014-10-24 NOTE — Telephone Encounter (Signed)
Last office visit 06/21/2014.  Last refilled 08/13/2014 for #180 with no refills.  Ok to refill?

## 2014-11-12 ENCOUNTER — Ambulatory Visit (INDEPENDENT_AMBULATORY_CARE_PROVIDER_SITE_OTHER): Payer: Medicare Other | Admitting: Internal Medicine

## 2014-11-12 ENCOUNTER — Encounter: Payer: Self-pay | Admitting: Internal Medicine

## 2014-11-12 VITALS — BP 140/64 | HR 68 | Ht 66.75 in | Wt 140.2 lb

## 2014-11-12 DIAGNOSIS — Z86010 Personal history of colonic polyps: Secondary | ICD-10-CM | POA: Diagnosis not present

## 2014-11-12 NOTE — Patient Instructions (Signed)
Please follow up with Dr. Perry as needed 

## 2014-11-12 NOTE — Progress Notes (Signed)
HISTORY OF PRESENT ILLNESS:  Kathryn [name] is a 81 y.o. female new to me, for a patient of Dr. Sharlett Iles, who presents today regarding surveillance colonoscopy. Previous colonoscopies 2000 and 2004 with a history of adenomas. Last colonoscopy January 2010 with 2 diminutive colon polyps which were removed and found to be non-adenomatous. Initially, follow-up in 5 years recommended. Dr. Buel Ream letter stated follow-up as needed. Due to computer-generated letter she presents today. The patient denies any active GI symptoms. No interval family history of colon cancer. She remains active. Outside laboratories without relevant abnormalities.  REVIEW OF SYSTEMS:  All non-GI ROS negative except for arthritis and allergies  Past Medical History  Diagnosis Date  . Arthritis   . Allergy   . Hyperlipidemia   . Irregular heart rate   . Colon polyps     Past Surgical History  Procedure Laterality Date  . Cardiac catheterization    . Tubal ligation    . Total hip arthroplasty  2007    LEFT  . Total knee arthroplasty      Right     Social History Mckynzie B Edison  reports that she has never smoked. She has never used smokeless tobacco. She reports that she drinks about 1.8 oz of alcohol per week. She reports that she does not use illicit drugs.  family history includes Bone cancer in her sister; Breast cancer in her sister; Heart attack in her brother; Heart disease in her brother and sister; Hypertension in her brother and sister; Pancreatic cancer in her mother. There is no history of Colon cancer, Colon polyps, Esophageal cancer, Diabetes, Kidney disease, or Gallbladder disease.  Allergies  Allergen Reactions  . Macrobid [employer] Macro]     Made pt sick to per stomach and shaky       PHYSICAL EXAMINATION: Vital signs: BP 140/64 mmHg  Pulse 68  Ht 5' 6.75" (1.695 m)  Wt 140 lb 4 oz (63.617 kg)  BMI 22.14 kg/m2 General: Well-developed, well-nourished, no acute  distress HEENT: Sclerae are anicteric, conjunctiva pink. Oral mucosa intact Lungs: Clear Heart: Regular Abdomen: soft, nontender, nondistended, no obvious ascites, no peritoneal signs, normal bowel sounds. No organomegaly. Extremities: No edema Psychiatric: alert and oriented x3. Cooperative   ASSESSMENT:  #1. History of adenomatous colon polyps. Last colonoscopy 2010 with non-adenomatous. Patient is 81 years old   PLAN:  #1. I reviewed with patient the 2012 consensus guidelines for colorectal neoplasia surveillance. She is asymptomatic without relevant clinical problems identifiable. As such, no further surveillance colonoscopy recommended. All questions answered. She will return to her PCP

## 2014-11-26 DIAGNOSIS — M720 Palmar fascial fibromatosis [Dupuytren]: Secondary | ICD-10-CM | POA: Diagnosis not present

## 2015-01-02 ENCOUNTER — Other Ambulatory Visit: Payer: Self-pay | Admitting: Family Medicine

## 2015-01-02 NOTE — Telephone Encounter (Signed)
Last office visit 06/18/2014.  Last refilled 10/24/2014 for #180 with no refills.  Ok to refill?

## 2015-01-10 ENCOUNTER — Other Ambulatory Visit: Payer: Self-pay | Admitting: Orthopedic Surgery

## 2015-01-10 DIAGNOSIS — M720 Palmar fascial fibromatosis [Dupuytren]: Secondary | ICD-10-CM | POA: Diagnosis not present

## 2015-01-16 ENCOUNTER — Ambulatory Visit (INDEPENDENT_AMBULATORY_CARE_PROVIDER_SITE_OTHER): Payer: Medicare Other | Admitting: Family Medicine

## 2015-01-16 ENCOUNTER — Encounter: Payer: Self-pay | Admitting: Family Medicine

## 2015-01-16 VITALS — BP 100/70 | HR 75 | Temp 97.9°F | Ht 66.75 in | Wt 135.8 lb

## 2015-01-16 DIAGNOSIS — T148XXA Other injury of unspecified body region, initial encounter: Secondary | ICD-10-CM | POA: Insufficient documentation

## 2015-01-16 DIAGNOSIS — T148 Other injury of unspecified body region: Secondary | ICD-10-CM | POA: Diagnosis not present

## 2015-01-16 NOTE — Progress Notes (Signed)
Pre visit review using our clinic review tool, if applicable. No additional management support is needed unless otherwise documented below in the visit note. 

## 2015-01-16 NOTE — Patient Instructions (Signed)
Ice and elevated leg.  Stay of meloxicam for now.  Call if redness, increasing size or increasing pain in hematoma.

## 2015-01-16 NOTE — Progress Notes (Signed)
   Subjective:    Patient ID: Kathryn [name], female    DOB: [birth year], 81 y.o.   MRN: [id number]  HPI   81 year old female presents following injury of right legg 2 weeks ago on oven door. Swelling appeared quickly , size of half and tennis ball. Very painful. Some associated redness but no spreading.  No change in size of knot since. Area with less pain now, only sore to touch.  No fever. No numbness in foot, no tingling  No treating with anything like pain med. Not elevating, no icing. On meloxicam, but not taking now.     Review of Systems  Constitutional: Negative for fever and fatigue.  HENT: Negative for ear pain.   Eyes: Negative for pain.  Respiratory: Negative for chest tightness and shortness of breath.   Cardiovascular: Negative for chest pain, palpitations and leg swelling.  Gastrointestinal: Negative for abdominal pain.  Genitourinary: Negative for dysuria.  Musculoskeletal:       Recent contracture surgery in left hand       Objective:   Physical Exam  Constitutional: Vital signs are normal. She appears well-developed and well-nourished. She is cooperative.  Non-toxic appearance. She does not appear ill. No distress.  HENT:  Head: Normocephalic.  Right Ear: Hearing, tympanic membrane and ear canal normal. Tympanic membrane is not erythematous, not retracted and not bulging.  Left Ear: Hearing, tympanic membrane and ear canal normal. Tympanic membrane is not erythematous, not retracted and not bulging.  Nose: No mucosal edema or rhinorrhea. Right sinus exhibits no maxillary sinus tenderness and no frontal sinus tenderness. Left sinus exhibits no maxillary sinus tenderness and no frontal sinus tenderness.  Mouth/Throat: Uvula is midline and mucous membranes are normal.  Eyes: Conjunctivae and lids are normal. Lids are everted and swept, no foreign bodies found.  Neck: Trachea normal. Carotid bruit is not present. No thyroid mass present.  Cardiovascular: Normal  rate, regular rhythm, S1 normal, S2 normal, normal heart sounds, intact distal pulses and normal pulses.  Exam reveals no gallop and no friction rub.   No murmur heard. Pulmonary/Chest: Effort normal and breath sounds normal. No tachypnea. No respiratory distress. She has no decreased breath sounds. She has no wheezes. She has no rhonchi. She has no rales.  Abdominal: Soft. Normal appearance and bowel sounds are normal. There is no tenderness.  Neurological: She is alert.  Skin: Skin is warm, dry and intact. Lesion noted. No rash noted.     4 cm x 4 cm hematoma right inner calf, slight erythema, no increase in warmth, mild pain.  Psychiatric: Her speech is normal and behavior is normal. Judgment and thought content normal. Her mood appears not anxious. Cognition and memory are normal. She does not exhibit a depressed mood.          Assessment & Plan:

## 2015-01-16 NOTE — Assessment & Plan Note (Signed)
No associated infection. Appears to be healing well. Can elevated and apply ice.  Discussed that it will take 1-2 months for reabsorption of hematoma.

## 2015-02-11 DIAGNOSIS — L821 Other seborrheic keratosis: Secondary | ICD-10-CM | POA: Diagnosis not present

## 2015-02-11 DIAGNOSIS — D485 Neoplasm of uncertain behavior of skin: Secondary | ICD-10-CM | POA: Diagnosis not present

## 2015-02-11 DIAGNOSIS — L570 Actinic keratosis: Secondary | ICD-10-CM | POA: Diagnosis not present

## 2015-02-18 DIAGNOSIS — M79642 Pain in left hand: Secondary | ICD-10-CM | POA: Diagnosis not present

## 2015-02-18 DIAGNOSIS — M24542 Contracture, left hand: Secondary | ICD-10-CM | POA: Diagnosis not present

## 2015-02-18 DIAGNOSIS — M62442 Contracture of muscle, left hand: Secondary | ICD-10-CM | POA: Diagnosis not present

## 2015-02-18 DIAGNOSIS — L905 Scar conditions and fibrosis of skin: Secondary | ICD-10-CM | POA: Diagnosis not present

## 2015-02-18 DIAGNOSIS — M720 Palmar fascial fibromatosis [Dupuytren]: Secondary | ICD-10-CM | POA: Diagnosis not present

## 2015-02-25 ENCOUNTER — Other Ambulatory Visit: Payer: Self-pay | Admitting: Family Medicine

## 2015-02-25 DIAGNOSIS — M24542 Contracture, left hand: Secondary | ICD-10-CM | POA: Diagnosis not present

## 2015-02-25 DIAGNOSIS — M62442 Contracture of muscle, left hand: Secondary | ICD-10-CM | POA: Diagnosis not present

## 2015-02-25 DIAGNOSIS — M79642 Pain in left hand: Secondary | ICD-10-CM | POA: Diagnosis not present

## 2015-02-25 DIAGNOSIS — M720 Palmar fascial fibromatosis [Dupuytren]: Secondary | ICD-10-CM | POA: Diagnosis not present

## 2015-03-04 ENCOUNTER — Ambulatory Visit (INDEPENDENT_AMBULATORY_CARE_PROVIDER_SITE_OTHER): Payer: Medicare Other | Admitting: Family Medicine

## 2015-03-04 ENCOUNTER — Encounter: Payer: Self-pay | Admitting: Family Medicine

## 2015-03-04 VITALS — BP 110/70 | HR 69 | Temp 98.3°F | Ht 66.75 in | Wt 136.8 lb

## 2015-03-04 DIAGNOSIS — M79642 Pain in left hand: Secondary | ICD-10-CM | POA: Diagnosis not present

## 2015-03-04 DIAGNOSIS — M24542 Contracture, left hand: Secondary | ICD-10-CM | POA: Diagnosis not present

## 2015-03-04 DIAGNOSIS — M62442 Contracture of muscle, left hand: Secondary | ICD-10-CM | POA: Diagnosis not present

## 2015-03-04 DIAGNOSIS — S81801A Unspecified open wound, right lower leg, initial encounter: Secondary | ICD-10-CM | POA: Diagnosis not present

## 2015-03-04 DIAGNOSIS — T148 Other injury of unspecified body region: Secondary | ICD-10-CM

## 2015-03-04 DIAGNOSIS — M720 Palmar fascial fibromatosis [Dupuytren]: Secondary | ICD-10-CM | POA: Diagnosis not present

## 2015-03-04 DIAGNOSIS — T148XXA Other injury of unspecified body region, initial encounter: Secondary | ICD-10-CM

## 2015-03-04 NOTE — Progress Notes (Signed)
Pre visit review using our clinic review tool, if applicable. No additional management support is needed unless otherwise documented below in the visit note. 

## 2015-03-04 NOTE — Progress Notes (Signed)
   Dr. Frederico Hamman T. Maimuna Leaman, MD, [facility] [place], [address] Phone: [phone] Fax: [phone]  03/04/2015  Patient: Kathryn [name], MRN: [id number], DOB: 1931/08/09, 81 y.o.  Primary Physician:  Eliezer Lofts, MD  Chief Complaint: Follow-up  Subjective:   Kathryn [name] is a 81 y.o. very pleasant female patient who presents with the following:  R leg - had a hematoma, then it has split open. She struck her left leg about 2 months ago, and then about 3 weeks this area opened, and it has been open since then. She has been treating this with Vaseline and Neosporin, and it is gradually been improving.  Past Medical History, Surgical History, Social History, Family History, Problem List, Medications, and Allergies have been reviewed and updated if relevant.  GEN: No fevers, chills. Nontoxic. Primarily MSK c/o today. MSK: Detailed in the HPI GI: tolerating PO intake without difficulty Neuro: No numbness, parasthesias, or tingling associated. Otherwise the pertinent positives of the ROS are noted above.   Objective:   BP 110/70 mmHg  Pulse 69  Temp(Src) 98.3 F (36.8 C) (Oral)  Ht 5' 6.75" (1.695 m)  Wt 136 lb 12 oz (62.029 kg)  BMI 21.59 kg/m2   GEN: WDWN, NAD, Non-toxic, Alert & Oriented x 3 HEENT: Atraumatic, Normocephalic.  Ears and Nose: No external deformity. EXTR: No clubbing/cyanosis/edema NEURO: Normal gait.  PSYCH: Normally interactive. Conversant. Not depressed or anxious appearing.  Calm demeanor.    Skin: Right leg, distal shin there is an area that is approximately 2-1/2 cm that has evidence of healing and healing tissue. There is no redness or warmth surrounding this. Good granulation.  Radiology: No results found.  Assessment and Plan:   Leg wound, right, initial encounter  Hematoma  Good granulation, no vascular disease or dm  I would anticipate that she should heal this well. Continue with  Vaseline and Neosporin. She can leave it open to the air she is sedentary.  Follow-up: prn  New Prescriptions   No medications on file   No orders of the defined types were placed in this encounter.    Signed,  Maud Deed. Annlee Glandon, MD   Patient's Medications  New Prescriptions   No medications on file  Previous Medications   CALCIUM CARBONATE (OS-CAL) 600 MG TABS    Take 600 mg by mouth 2 (two) times daily with a meal.     CETIRIZINE (ZYRTEC) 10 MG TABLET    Take 1 tablet (10 mg total) by mouth daily.   CYCLOBENZAPRINE (FLEXERIL) 5 MG TABLET    TAKE 1 TABLET AT BEDTIME AS NEEDED FOR MUSCLE SPASMS   DICLOFENAC (VOLTAREN) 75 MG EC TABLET    TAKE 1 TABLET TWICE A DAY   MELOXICAM (MOBIC) 15 MG TABLET    Take 1 tablet (15 mg total) by mouth daily.   OXYCODONE-ACETAMINOPHEN (PERCOCET/ROXICET) 5-325 MG PER TABLET       SIMVASTATIN (ZOCOR) 40 MG TABLET    TAKE 1 TABLET AT BEDTIME  Modified Medications   No medications on file  Discontinued Medications   No medications on file

## 2015-03-07 ENCOUNTER — Other Ambulatory Visit: Payer: Self-pay | Admitting: Family Medicine

## 2015-03-11 ENCOUNTER — Ambulatory Visit: Payer: Medicare Other | Admitting: Family Medicine

## 2015-03-19 ENCOUNTER — Telehealth: Payer: Self-pay | Admitting: Family Medicine

## 2015-03-19 NOTE — Telephone Encounter (Signed)
Form placed in your inbox

## 2015-03-19 NOTE — Telephone Encounter (Signed)
Pt brought in paperwork to be filled out regarding a handicap sticker. Best number to reach her at is [phone]. Placing papers on Donna's inbox.

## 2015-03-31 ENCOUNTER — Other Ambulatory Visit: Payer: Self-pay | Admitting: Family Medicine

## 2015-03-31 NOTE — Telephone Encounter (Signed)
Last office visit 03/04/15 with Dr. Lorelei Pont. Last refilled 01/02/2015 for #180 with no refills  Ok to refill?

## 2015-04-01 DIAGNOSIS — D485 Neoplasm of uncertain behavior of skin: Secondary | ICD-10-CM | POA: Diagnosis not present

## 2015-04-01 DIAGNOSIS — L723 Sebaceous cyst: Secondary | ICD-10-CM | POA: Diagnosis not present

## 2015-04-01 DIAGNOSIS — L820 Inflamed seborrheic keratosis: Secondary | ICD-10-CM | POA: Diagnosis not present

## 2015-04-01 DIAGNOSIS — T148 Other injury of unspecified body region: Secondary | ICD-10-CM | POA: Diagnosis not present

## 2015-04-01 DIAGNOSIS — L821 Other seborrheic keratosis: Secondary | ICD-10-CM | POA: Diagnosis not present

## 2015-04-15 DIAGNOSIS — L905 Scar conditions and fibrosis of skin: Secondary | ICD-10-CM | POA: Diagnosis not present

## 2015-04-15 DIAGNOSIS — M720 Palmar fascial fibromatosis [Dupuytren]: Secondary | ICD-10-CM | POA: Diagnosis not present

## 2015-05-24 ENCOUNTER — Telehealth: Payer: Self-pay | Admitting: Family Medicine

## 2015-05-24 NOTE — Telephone Encounter (Signed)
Please call and schedule Medicare Wellness with fasting labs prior for Dr. Bedsole. 

## 2015-05-26 NOTE — Telephone Encounter (Signed)
Left message asking pt to call office  °

## 2015-05-27 DIAGNOSIS — M720 Palmar fascial fibromatosis [Dupuytren]: Secondary | ICD-10-CM | POA: Diagnosis not present

## 2015-05-27 DIAGNOSIS — L905 Scar conditions and fibrosis of skin: Secondary | ICD-10-CM | POA: Diagnosis not present

## 2015-05-27 NOTE — Telephone Encounter (Signed)
Patient called back and scheduled lab appointment on 07/01/15 and medicare wellness exam on 07/08/15.

## 2015-05-27 NOTE — Telephone Encounter (Signed)
Please close

## 2015-06-24 ENCOUNTER — Telehealth: Payer: Self-pay | Admitting: Family Medicine

## 2015-06-24 DIAGNOSIS — Z1231 Encounter for screening mammogram for malignant neoplasm of breast: Secondary | ICD-10-CM

## 2015-06-24 NOTE — Addendum Note (Signed)
Addended byEliezer Lofts E on: 06/24/2015 02:45 PM   Modules accepted: Orders

## 2015-06-24 NOTE — Telephone Encounter (Signed)
Patient tried to make her appointment for her mammogram, but she was told she needs a referral.  Patient goes to [place].

## 2015-06-25 NOTE — Telephone Encounter (Signed)
Called the patient to help make her mammogram and let her know that she can make her own screening mammogram if she is not having any problems. Patient wants to call herself to make her own around her schedule. She will call me back if she has any problem.

## 2015-06-28 ENCOUNTER — Other Ambulatory Visit: Payer: Self-pay | Admitting: Family Medicine

## 2015-06-29 NOTE — Telephone Encounter (Signed)
Last office visit 03/04/2015 with Dr. Lorelei Pont.  Last refilled 03/31/2015 for #180 with no refills.  Ok to refill?

## 2015-07-01 ENCOUNTER — Telehealth: Payer: Self-pay | Admitting: Family Medicine

## 2015-07-01 ENCOUNTER — Telehealth: Payer: Self-pay | Admitting: Radiology

## 2015-07-01 ENCOUNTER — Other Ambulatory Visit (INDEPENDENT_AMBULATORY_CARE_PROVIDER_SITE_OTHER): Payer: Medicare Other

## 2015-07-01 DIAGNOSIS — E538 Deficiency of other specified B group vitamins: Secondary | ICD-10-CM

## 2015-07-01 DIAGNOSIS — M810 Age-related osteoporosis without current pathological fracture: Secondary | ICD-10-CM | POA: Diagnosis not present

## 2015-07-01 DIAGNOSIS — E7800 Pure hypercholesterolemia, unspecified: Secondary | ICD-10-CM

## 2015-07-01 LAB — LIPID PANEL
CHOL/HDL RATIO: 3
Cholesterol: 159 mg/dL (ref 0–200)
HDL: 56.3 mg/dL (ref >39.00)
LDL CALC: 91 mg/dL (ref 0–99)
NONHDL: 102.9
Triglycerides: 62 mg/dL (ref 0.0–149.0)
VLDL: 12.4 mg/dL (ref 0.0–40.0)

## 2015-07-01 LAB — VITAMIN B12: VITAMIN B 12: 239 pg/mL (ref 211–911)

## 2015-07-01 LAB — COMPREHENSIVE METABOLIC PANEL
ALT: 18 U/L (ref 0–35)
AST: 25 U/L (ref 0–37)
Albumin: 3.9 g/dL (ref 3.5–5.2)
Alkaline Phosphatase: 70 U/L (ref 39–117)
BILIRUBIN TOTAL: 0.5 mg/dL (ref 0.2–1.2)
BUN: 20 mg/dL (ref 6–23)
CHLORIDE: 108 meq/L (ref 96–112)
CO2: 27 meq/L (ref 19–32)
CREATININE: 0.73 mg/dL (ref 0.40–1.20)
Calcium: 9.5 mg/dL (ref 8.4–10.5)
GFR: 81.14 mL/min (ref >60.00)
GLUCOSE: 86 mg/dL (ref 70–99)
Potassium: 4.7 mEq/L (ref 3.5–5.1)
SODIUM: 142 meq/L (ref 135–145)
Total Protein: 6.8 g/dL (ref 6.0–8.3)

## 2015-07-01 LAB — VITAMIN D 25 HYDROXY (VIT D DEFICIENCY, FRACTURES): VITD: 116.89 ng/mL (ref 30.00–100.00)

## 2015-07-01 NOTE — Telephone Encounter (Signed)
-----   Message from [name] sent at 06/23/2015 10:09 AM EDT ----- Regarding: Lab orders for Tuesday, 10.4.16 Patient is scheduled for CPX labs, please order future labs, Thanks , [name]

## 2015-07-01 NOTE — Telephone Encounter (Signed)
[facility] called a critical Vit D level, -116.89. Results given  to Dr Diona Browner

## 2015-07-01 NOTE — Telephone Encounter (Signed)
Mrs. Kathryn [name] notified as instructed by telephone. 

## 2015-07-01 NOTE — Telephone Encounter (Signed)
Call pt.. Stop any vit D asap given too high levels.  No other treatmenet needed.

## 2015-07-08 ENCOUNTER — Encounter: Payer: Self-pay | Admitting: Family Medicine

## 2015-07-08 ENCOUNTER — Ambulatory Visit (INDEPENDENT_AMBULATORY_CARE_PROVIDER_SITE_OTHER): Payer: Medicare Other | Admitting: Family Medicine

## 2015-07-08 VITALS — BP 127/75 | HR 78 | Temp 98.2°F | Ht 67.0 in | Wt 136.8 lb

## 2015-07-08 DIAGNOSIS — M810 Age-related osteoporosis without current pathological fracture: Secondary | ICD-10-CM | POA: Diagnosis not present

## 2015-07-08 DIAGNOSIS — E7800 Pure hypercholesterolemia, unspecified: Secondary | ICD-10-CM | POA: Diagnosis not present

## 2015-07-08 DIAGNOSIS — Z7189 Other specified counseling: Secondary | ICD-10-CM

## 2015-07-08 DIAGNOSIS — Z23 Encounter for immunization: Secondary | ICD-10-CM

## 2015-07-08 DIAGNOSIS — Z0000 Encounter for general adult medical examination without abnormal findings: Secondary | ICD-10-CM | POA: Diagnosis not present

## 2015-07-08 NOTE — Progress Notes (Signed)
Pre visit review using our clinic review tool, if applicable. No additional management support is needed unless otherwise documented below in the visit note. 

## 2015-07-08 NOTE — Patient Instructions (Addendum)
In 2-3 week restart vit D  400 IU daily to 2 times daily.  Keep working on healthy eating and regular weight bearing exercise.

## 2015-07-08 NOTE — Assessment & Plan Note (Signed)
Well controlled. Continue current medication.  

## 2015-07-08 NOTE — Assessment & Plan Note (Signed)
Refuses treatment. Decrease vit D dose.

## 2015-07-08 NOTE — Progress Notes (Signed)
HPI  I have personally reviewed the Medicare Annual Wellness questionnaire and have noted 1. The patient's medical and social history 2. Their use of alcohol, tobacco or illicit drugs 3. Their current medications and supplements 4. The patient's functional ability including ADL's, fall risks, home safety risks and hearing or visual             impairment. 5. Diet and physical activities 6. Evidence for depression or mood disorders 7.         Updated provider list Cognitive evaluation was performed and recorded on pt medicare questionnaire form. The patients weight, height, BMI and visual acuity have been recorded in the chart  I have made referrals, counseling and provided education to the patient based review of the above and I have provided the pt with a written personalized care plan for preventive services.    Elevated Cholesterol: LDL at goal < 130 on zocor 40 mg daily  Lab Results  Component Value Date   CHOL 159 07/01/2015   HDL 56.30 07/01/2015   LDLCALC 91 07/01/2015   LDLDIRECT 116.9 09/25/2008   TRIG 62.0 07/01/2015   CHOLHDL 3 07/01/2015  Using medications without problems:  None Muscle aches: None  Diet compliance:Good  Exercise:walking 2-3 times a week  Other complaints:   Wt Readings from Last 3 Encounters:  07/08/15 136 lb 12 oz (62.029 kg)  03/04/15 136 lb 12 oz (62.029 kg)  01/16/15 135 lb 12 oz (61.576 kg)   Body mass index is 21.41 kg/(m^2).   Osteopenia, worsened last check: on no med, previously on boniva  Refused further treatment.   History   Social History  . Marital Status: Married, husband with dementia, she is caregiver.    Spouse Name: N/A    Number of Children: N/A  . Years of Education: N/A   Social History Main Topics  . Smoking status: Never Smoker   . Smokeless tobacco: Never Used  . Alcohol Use: 1.5 oz/week    3 drink(s) per week  . Drug Use: No  . Sexual Activity: No   Other  Topics Concern  . None   Social History Narrative   Regular exercise-- yes, 3-4 times a week      Diet: limited water, some fruit and veggies      Full code, has living will, HCPOA, son Seini Lannom ( reviewed 2016)  Social History /Family History/Past Medical History reviewed and updated if needed.   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  Review of Systems  Constitutional: Negative for fever, fatigue and unexpected weight change.  HENT: Positive for rhinorrhea. Negative for ear pain, congestion, sore throat, sneezing, trouble swallowing and sinus pressure.  Eyes: Negative for pain and itching.  Respiratory: Negative for cough, shortness of breath and wheezing.  Cardiovascular: Negative for chest pain, palpitations and leg swelling.  Gastrointestinal: Negative for nausea, abdominal pain, diarrhea, constipation and blood in stool.  Genitourinary: Negative for dysuria, hematuria, vaginal discharge, difficulty urinating and menstrual problem.  Skin: Negative for rash.  Neurological: Negative for syncope, weakness, light-headedness, numbness and headaches.  Psychiatric/Behavioral: Negative for confusion and dysphoric mood. The patient is not nervous/anxious.  Objective:   Physical Exam  Constitutional: Vital signs are normal. She appears well-developed and well-nourished. She is cooperative. Non-toxic appearance. She does not appear ill. No distress.  HENT:  Head: Normocephalic.  Right Ear: Hearing, tympanic membrane, external ear and ear canal normal.  Left Ear: Hearing, tympanic membrane, external ear and ear canal normal.  Nose:  Nose normal.  Eyes: Conjunctivae, EOM and lids are normal. Pupils are equal, round, and reactive to light. No foreign bodies found.  Neck: Trachea normal and normal range of motion. Neck supple. Carotid bruit is not present. No mass and no thyromegaly present.  Cardiovascular: Normal rate, Regular rhythm, S1 normal, S2 normal,  normal heart sounds and intact distal pulses. Exam reveals no gallop.  No murmur. Pulmonary/Chest: Effort normal and breath sounds normal. No respiratory distress. She has no wheezes. She has no rhonchi. She has no rales.  Abdominal: Soft. Normal appearance and bowel sounds are normal. She exhibits no distension, no fluid wave, no abdominal bruit and no mass. There is no hepatosplenomegaly. There is no tenderness. There is no rebound, no guarding and no CVA tenderness. No hernia.  Genitourinary: No breast swelling, tenderness, discharge or bleeding. Pelvic exam was performed with patient prone.  No pap indicated  Lymphadenopathy:  She has no cervical adenopathy.  She has no axillary adenopathy.  Neurological: She is alert. She has normal strength. No cranial nerve deficit or sensory deficit.  Skin: Skin is warm, dry and intact. No rash noted.  Psychiatric: Her speech is normal and behavior is normal. Judgment normal. Her mood appears not anxious. Cognition and memory are normal. She does not exhibit a depressed mood.  Assessment & Plan:   The patient's preventative maintenance and recommended screening tests for an annual wellness exam were reviewed in full today.  Brought up to date unless services declined.  Counselled on the importance of diet, exercise, and its role in overall health and mortality.  The patient's FH and SH was reviewed, including their home life, tobacco status, and drug and alcohol status.   Nonsmoker  Vaccines Uptodate Colon: Dr. Henrene Pastor, 2 polyps 09/2008,no further indicated due to age. DEXA: 2015 worsened osteopenia, on no med, pt refused 05/2014. She is now off vit D because she got to high. Mammogram: 05/2014 nml, no further indicated DVE/PAP: no indication due to age.

## 2015-07-22 DIAGNOSIS — Z1231 Encounter for screening mammogram for malignant neoplasm of breast: Secondary | ICD-10-CM | POA: Diagnosis not present

## 2015-07-22 DIAGNOSIS — Z803 Family history of malignant neoplasm of breast: Secondary | ICD-10-CM | POA: Diagnosis not present

## 2015-07-23 ENCOUNTER — Encounter: Payer: Self-pay | Admitting: Family Medicine

## 2015-08-12 ENCOUNTER — Ambulatory Visit (INDEPENDENT_AMBULATORY_CARE_PROVIDER_SITE_OTHER): Payer: Medicare Other | Admitting: Gynecology

## 2015-08-12 ENCOUNTER — Encounter: Payer: Self-pay | Admitting: Gynecology

## 2015-08-12 VITALS — BP 130/86 | Ht 67.0 in | Wt 134.0 lb

## 2015-08-12 DIAGNOSIS — M810 Age-related osteoporosis without current pathological fracture: Secondary | ICD-10-CM | POA: Diagnosis not present

## 2015-08-12 DIAGNOSIS — Z01419 Encounter for gynecological examination (general) (routine) without abnormal findings: Secondary | ICD-10-CM

## 2015-08-12 DIAGNOSIS — N952 Postmenopausal atrophic vaginitis: Secondary | ICD-10-CM

## 2015-08-12 NOTE — Progress Notes (Signed)
Kathryn [name] 1931-09-25 [id number]   History:    81 y.o.  for annual gyn exam who ho has not been seen in the office for over 2 years. Patient with known history of osteoporosis but has refused treatment. Review of her last bone density study in 2015 had demonstrated the following:  Recent bone density shows worsened significantly.  T score went from -2.4 osteopenia to -3.2 in radius Severe osteoporosis. Osteopenia -1.7 in spine Osteoporosis-2.8 in hip    she is currently taking her calcium and vitamin D this year her calcium level was normal and vitamin D was not checked by her PCP so we will do so today. Review of her record  Also indicated that she had been on Boniva and Fosamax in the past for 2-3 years with each product. Recla 2 years ago she was going to be set up for Reclast  IV infusion but she opted out. Patient stated that she had a colonoscopy in 2012 benign polyps were removed in that she had a follow-up colonoscopy in 2014 which was normal. Her vaccines are all up-to-date. She is currently on no hormone replacement therapy.   patient would no prior history of any abnormal Pap smears.  Past medical history,surgical history, family history and social history were all reviewed and documented in the EPIC chart.  Gynecologic History No LMP recorded. Patient is postmenopausal. Contraception: post menopausal status Last Pap:  2010. Results were: normal Last mammogram:  2016. Results were: normal  Obstetric History OB History  Gravida Para Term Preterm AB SAB TAB Ectopic Multiple Living  1 1 1       1     # Outcome Date GA Lbr Len/2nd Weight Sex Delivery Anes PTL Lv  1 Term                ROS: A ROS was performed and pertinent positives and negatives are included in the history.  GENERAL: No fevers or chills. HEENT: No change in vision, no earache, sore throat or sinus congestion. NECK: No pain or stiffness. CARDIOVASCULAR: No chest pain or pressure. No palpitations.  PULMONARY: No shortness of breath, cough or wheeze. GASTROINTESTINAL: No abdominal pain, nausea, vomiting or diarrhea, melena or bright red blood per rectum. GENITOURINARY: No urinary frequency, urgency, hesitancy or dysuria. MUSCULOSKELETAL: No joint or muscle pain, no back pain, no recent trauma. DERMATOLOGIC: No rash, no itching, no lesions. ENDOCRINE: No polyuria, polydipsia, no heat or cold intolerance. No recent change in weight. HEMATOLOGICAL: No anemia or easy bruising or bleeding. NEUROLOGIC: No headache, seizures, numbness, tingling or weakness. PSYCHIATRIC: No depression, no loss of interest in normal activity or change in sleep pattern.     Exam: chaperone present  BP 130/86 mmHg  Ht 5\' 7"  (1.702 m)  Wt 134 lb (60.782 kg)  BMI 20.98 kg/m2  Body mass index is 20.98 kg/(m^2).  General appearance : Well developed well nourished female. No acute distress HEENT: Eyes: no retinal hemorrhage or exudates,  Neck supple, trachea midline, no carotid bruits, no thyroidmegaly Lungs: Clear to auscultation, no rhonchi or wheezes, or rib retractions  Heart: Regular rate and rhythm, no murmurs or gallops Breast:Examined in sitting and supine position were symmetrical in appearance, no palpable masses or tenderness,  no skin retraction, no nipple inversion, no nipple discharge, no skin discoloration, no axillary or supraclavicular lymphadenopathy Abdomen: no palpable masses or tenderness, no rebound or guarding Extremities: no edema or skin discoloration or tenderness  Pelvic:  Bartholin, Urethra, Skene  Glands: Within normal limits             Vagina: No gross lesions or discharge , atrophic changes  Cervix: No gross lesions or discharge  Uterus   Slightly anteverted, normal size, shape and consistency, non-tender and mobile  Adnexa  Without masses or tenderness  Anus and perineum  normal   Rectovaginal  normal sphincter tone without palpated masses or tenderness             Hemoccult  PCP  provides     Assessment/Plan:  81 y.o. female for annual exam  With known history of osteoporosis refuses treatment. She is currently taking her calcium and vitamin D. We will check a vitamin D level today. Her PCP has been ordering her bone density studies  As well as her blood work. Patient's vaccines are all up-to-date. Pap smear no longer indicated according to the new guidelines.   Terrance Mass MD, 2:23 PM 08/12/2015

## 2015-08-13 LAB — VITAMIN D 25 HYDROXY (VIT D DEFICIENCY, FRACTURES): Vit D, 25-Hydroxy: 77 ng/mL (ref 30–100)

## 2015-08-20 ENCOUNTER — Other Ambulatory Visit: Payer: Self-pay | Admitting: Family Medicine

## 2015-09-25 ENCOUNTER — Other Ambulatory Visit: Payer: Self-pay | Admitting: Family Medicine

## 2015-09-25 ENCOUNTER — Encounter: Payer: Self-pay | Admitting: *Deleted

## 2015-12-22 ENCOUNTER — Other Ambulatory Visit: Payer: Self-pay | Admitting: Family Medicine

## 2015-12-22 NOTE — Telephone Encounter (Signed)
Last office visit 07/07/2014.  Last refilled 09/25/2015 for #180 with no refills.  Ok to refill?

## 2016-02-04 ENCOUNTER — Ambulatory Visit (INDEPENDENT_AMBULATORY_CARE_PROVIDER_SITE_OTHER): Payer: Medicare Other | Admitting: Internal Medicine

## 2016-02-04 ENCOUNTER — Telehealth: Payer: Self-pay | Admitting: Family Medicine

## 2016-02-04 ENCOUNTER — Encounter: Payer: Self-pay | Admitting: Internal Medicine

## 2016-02-04 VITALS — BP 114/64 | HR 57 | Temp 98.1°F | Wt 134.0 lb

## 2016-02-04 DIAGNOSIS — R350 Frequency of micturition: Secondary | ICD-10-CM | POA: Diagnosis not present

## 2016-02-04 DIAGNOSIS — R5383 Other fatigue: Secondary | ICD-10-CM

## 2016-02-04 DIAGNOSIS — R631 Polydipsia: Secondary | ICD-10-CM

## 2016-02-04 DIAGNOSIS — R0789 Other chest pain: Secondary | ICD-10-CM

## 2016-02-04 DIAGNOSIS — R251 Tremor, unspecified: Secondary | ICD-10-CM

## 2016-02-04 DIAGNOSIS — N390 Urinary tract infection, site not specified: Secondary | ICD-10-CM

## 2016-02-04 LAB — COMPREHENSIVE METABOLIC PANEL
ALT: 51 U/L — ABNORMAL HIGH (ref 0–35)
AST: 55 U/L — ABNORMAL HIGH (ref 0–37)
Albumin: 3.8 g/dL (ref 3.5–5.2)
Alkaline Phosphatase: 72 U/L (ref 39–117)
BILIRUBIN TOTAL: 0.8 mg/dL (ref 0.2–1.2)
BUN: 21 mg/dL (ref 6–23)
CALCIUM: 9.6 mg/dL (ref 8.4–10.5)
CHLORIDE: 105 meq/L (ref 96–112)
CO2: 27 meq/L (ref 19–32)
Creatinine, Ser: 0.76 mg/dL (ref 0.40–1.20)
GFR: 77.34 mL/min (ref >60.00)
GLUCOSE: 101 mg/dL — AB (ref 70–99)
POTASSIUM: 4.4 meq/L (ref 3.5–5.1)
Sodium: 139 mEq/L (ref 135–145)
Total Protein: 7 g/dL (ref 6.0–8.3)

## 2016-02-04 LAB — TSH: TSH: 1.35 u[IU]/mL (ref 0.35–4.50)

## 2016-02-04 LAB — CBC
HCT: 32.3 % — ABNORMAL LOW (ref 36.0–46.0)
HEMOGLOBIN: 10.8 g/dL — AB (ref 12.0–15.0)
MCHC: 33.4 g/dL (ref 30.0–36.0)
MCV: 94.3 fl (ref 78.0–100.0)
PLATELETS: 252 10*3/uL (ref 150.0–400.0)
RBC: 3.43 Mil/uL — ABNORMAL LOW (ref 3.87–5.11)
RDW: 15.3 % (ref 11.5–15.5)
WBC: 10 10*3/uL (ref 4.0–10.5)

## 2016-02-04 LAB — POC URINALSYSI DIPSTICK (AUTOMATED)
Bilirubin, UA: NEGATIVE
Glucose, UA: NEGATIVE
KETONES UA: NEGATIVE
NITRITE UA: POSITIVE
PH UA: 6
Spec Grav, UA: 1.02
UROBILINOGEN UA: NEGATIVE

## 2016-02-04 LAB — HEMOGLOBIN A1C: HEMOGLOBIN A1C: 5.7 % (ref 4.6–6.5)

## 2016-02-04 MED ORDER — CIPROFLOXACIN HCL 250 MG PO TABS
250.0000 mg | ORAL_TABLET | Freq: Two times a day (BID) | ORAL | Status: DC
Start: 2016-02-04 — End: 2016-07-13

## 2016-02-04 NOTE — Progress Notes (Signed)
Subjective:    Patient ID: Kathryn [name], female    DOB: 10/20/1930, 82 y.o.   MRN: [id number]  HPI  Pt presents to the clinic today with c/o shakiness, usually when she first wakes up in the mornings. This started 4 days ago. She subsequently feels weaker and more fatigued throughout the day. She repots increased thirst and increased urinary frequency. She denies fevers, chills, abdominal pain, or dysuria. She does not feel like this is a UTI. She has no h/o diabetes and has never been screened. She is also reporting an increase in heartburn the past few nights. She reports a chest tightness, but denies SOB, pain, palpitations, or dizziness. She denies nausea. She has been taking Tums with relief.  Review of Systems  Past Medical History  Diagnosis Date  . Arthritis   . Allergy   . Hyperlipidemia   . Irregular heart rate   . Colon polyps     Current Outpatient Prescriptions  Medication Sig Dispense Refill  . calcium carbonate (OS-CAL) 600 MG TABS Take 600 mg by mouth 2 (two) times daily with a meal.      . cetirizine (ZYRTEC) 10 MG tablet TAKE 1 TABLET (10 MG TOTAL) BY MOUTH DAILY. 90 tablet 3  . cyclobenzaprine (FLEXERIL) 5 MG tablet TAKE 1 TABLET AT BEDTIME AS NEEDED FOR MUSCLE SPASMS 90 tablet 0  . diclofenac (VOLTAREN) 75 MG EC tablet TAKE 1 TABLET TWICE A DAY 180 tablet 0  . simvastatin (ZOCOR) 40 MG tablet TAKE 1 TABLET AT BEDTIME 90 tablet 3   No current facility-administered medications for this visit.    Allergies  Allergen Reactions  . Macrobid [Nitrofurantoin Monohyd Macro]     Made pt sick to per stomach and shaky    Family History  Problem Relation Age of Onset  . Pancreatic cancer Mother   . Hypertension Sister   . Heart disease Sister   . Breast cancer Sister     Age 6's  . Heart attack Brother   . Heart disease Brother   . Hypertension Brother   . Bone cancer Sister   . Colon cancer Neg Hx   . Colon polyps Neg Hx   . Esophageal cancer Neg Hx   .  Diabetes Neg Hx   . Kidney disease Neg Hx   . Gallbladder disease Neg Hx     Social History   Social History  . Marital Status: Married    Spouse Name: N/A  . Number of Children: 1  . Years of Education: N/A   Occupational History  . [employer]     Works at [employer]  . Smoking status: Never Smoker   . Smokeless tobacco: Never Used  . Alcohol Use: 1.8 oz/week    3 Standard drinks or equivalent per week     Comment: Occassionally  . Drug Use: No  . Sexual Activity: No   Other Topics Concern  . Not on file   Social History Narrative   Regular exercise-- yes, 3-4 times a week      Diet: limited water, some fruit and veggies      Full code, has living will, HCPOA, son Lexandra Rettke ( reviewed 2015)     Constitutional: Pt reports fever, malaise and weakness. Denies chills, headache, or abrupt weight changes.  Respiratory: Denies difficulty breathing, shortness of breath, or cough.  Cardiovascular: Pt reports chest tightness. Denies chest pain or palpitations  Gastrointestinal: Pt reports increased  thirst and reflux. Denies nausea or abdominal pain.  GU: Pt reports frequency. Denies urgency, pain with urination, burning sensation, blood in urine, odor or discharge. Skin: Denies redness, rashes, lesions or ulcercations.  Neurological: Denies dizziness, difficulty with memory, difficulty with speech or problems with balance and coordination.    No other specific complaints in a complete review of systems (except as listed in HPI above).     Objective:   Physical Exam  BP 114/64 mmHg  Pulse 57  Temp(Src) 98.1 F (36.7 C) (Oral)  Wt 134 lb (60.782 kg)  SpO2 99% Wt Readings from Last 3 Encounters:  02/04/16 134 lb (60.782 kg)  08/12/15 134 lb (60.782 kg)  07/08/15 136 lb 12 oz (62.029 kg)    General: Appears her stated age, well developed, well nourished in NAD. Cardiovascular: Regularly irregular. S1,S2 noted. No murmur, rubs  or gallops noted.  Pulmonary/Chest: Normal effort and positive vesicular breath sounds. No respiratory distress. No wheezes, rales or ronchi noted.  Abdomen: Soft and nontender. Normal bowel sounds. No distention or masses noted.  No CVA tenderness. Neurological: Alert and oriented.  EKG: 1st degree AV block unchanged from prior.   BMET    Component Value Date/Time   NA 142 07/01/2015 1055   K 4.7 07/01/2015 1055   CL 108 07/01/2015 1055   CO2 27 07/01/2015 1055   GLUCOSE 86 07/01/2015 1055   BUN 20 07/01/2015 1055   CREATININE 0.73 07/01/2015 1055   CREATININE 0.81 06/19/2013 1237   CALCIUM 9.5 07/01/2015 1055   GFRNONAA >60 03/04/2011 0505   GFRAA  03/04/2011 0505    >60        The eGFR has been calculated using the MDRD equation. This calculation has not been validated in all clinical situations. eGFR's persistently <60 mL/min signify possible Chronic Kidney Disease.    Lipid Panel     Component Value Date/Time   CHOL 159 07/01/2015 1055   TRIG 62.0 07/01/2015 1055   HDL 56.30 07/01/2015 1055   CHOLHDL 3 07/01/2015 1055   VLDL 12.4 07/01/2015 1055   LDLCALC 91 07/01/2015 1055    CBC    Component Value Date/Time   WBC 5.5 04/04/2012 1123   RBC 3.55* 04/04/2012 1123   HGB 11.8* 04/04/2012 1123   HCT 35.1* 04/04/2012 1123   PLT 210.0 04/04/2012 1123   MCV 98.8 04/04/2012 1123   MCH 31.8 03/04/2011 0505   MCHC 33.6 04/04/2012 1123   RDW 13.9 04/04/2012 1123   LYMPHSABS 0.9 04/04/2012 1123   MONOABS 0.4 04/04/2012 1123   EOSABS 0.2 04/04/2012 1123   BASOSABS 0.0 04/04/2012 1123    Hgb A1C No results found for: HGBA1C       Assessment & Plan:   Urinary frequency, increased thirst, chest tightness, shakiness and fatigue secondary to UTI:  Urinalysis: Positive for leuks, blood and nitrates. Negative for ketones and glucose. Urine culture sent eRx for Cipro 250 BID x5 days Push fluids Will check CBC, CMP, A1C and TSH today (to assess for other  causes of symptoms listed above)   Follow up pending lab results

## 2016-02-04 NOTE — Telephone Encounter (Signed)
PLEASE NOTE: All timestamps contained within this report are represented as [place] Standard Time. CONFIDENTIALTY NOTICE: This fax transmission is intended only for the addressee. It contains information that is legally privileged, confidential or otherwise protected from use or disclosure. If you are not the intended recipient, you are strictly prohibited from reviewing, disclosing, copying using or disseminating any of this information or taking any action in reliance on or regarding this information. If you have received this fax in error, please notify us immediately by telephone so that we can arrange for its return to [place]. Phone: [phone], Toll-Free: [phone], Fax: [phone] Page: 1 of 1 Call Id: [id number] Jerome Patient Name: Kathryn [name] DOB: 07-15-1931 Initial Comment Caller states she has been shaking all over, than she get nausea. Leg pain. She can't stop shaking, she hasn't ever done this before. Nurse Assessment Nurse: Marcelline Deist, RN, Lynda Date/Time (Eastern Time): 02/04/2016 8:35:36 AM Confirm and document reason for call. If symptomatic, describe symptoms. You must click the next button to save text entered. ---Caller states she has been shaking all over, than she gets nausea. Leg pain where her hip surgery was done after so much shaking. She can't stop shaking, she hasn't ever done this before. Took some sugar as she thought her sugar was low. Symptoms since Monday. No fever. Has the patient traveled out of the country within the last 30 days? ---Not Applicable Does the patient have any new or worsening symptoms? ---Yes Will a triage be completed? ---Yes Related visit to physician within the last 2 weeks? ---No Does the PT have any chronic conditions? (i.e. diabetes, asthma, etc.) ---Yes List chronic conditions. ---arthritis Is this a behavioral health or substance  abuse call? ---No Guidelines Guideline Title Affirmed Question Affirmed Notes Chest Pain [1] Chest pain lasting <= 5 minutes AND [2] NO chest pain or cardiac symptoms now (Exceptions: pains lasting a few seconds) Final Disposition User See Physician within [place], RN, [employer] provider not in office today. Scheduled with NP to be seen. She reports indigestion last night, and intermittent chest tightness, not lasting longer than 5 minutes. Also states she has been very thirsty which is unusual for her. Feels a little less shaky now after taking a spoonful of sugar. Referrals REFERRED TO PCP OFFICE Disagree/Comply: Comply

## 2016-02-04 NOTE — Telephone Encounter (Signed)
TH scheduled appt with Avie Echevaria NP on 02/04/16 at 1:45.

## 2016-02-04 NOTE — Progress Notes (Signed)
Pre visit review using our clinic review tool, if applicable. No additional management support is needed unless otherwise documented below in the visit note. 

## 2016-02-04 NOTE — Addendum Note (Signed)
Addended by: Lurlean Nanny on: 02/04/2016 03:21 PM   Modules accepted: Orders

## 2016-02-04 NOTE — Patient Instructions (Signed)

## 2016-02-06 LAB — URINE CULTURE

## 2016-03-11 DIAGNOSIS — Z96642 Presence of left artificial hip joint: Secondary | ICD-10-CM | POA: Diagnosis not present

## 2016-03-11 DIAGNOSIS — Z471 Aftercare following joint replacement surgery: Secondary | ICD-10-CM | POA: Diagnosis not present

## 2016-03-11 DIAGNOSIS — Z96651 Presence of right artificial knee joint: Secondary | ICD-10-CM | POA: Diagnosis not present

## 2016-03-11 DIAGNOSIS — M25551 Pain in right hip: Secondary | ICD-10-CM | POA: Diagnosis not present

## 2016-03-22 ENCOUNTER — Other Ambulatory Visit: Payer: Self-pay | Admitting: Family Medicine

## 2016-03-22 NOTE — Telephone Encounter (Signed)
Last office visit 02/04/2016 with Webb Silversmith.  Last refilled 12/23/2015 for #180 with no refills.  Ok to refill?

## 2016-03-23 MED ORDER — CETIRIZINE HCL 10 MG PO TABS: ORAL_TABLET | ORAL | Status: DC

## 2016-04-01 NOTE — Telephone Encounter (Signed)
Pt called to ck on status of zyrtec from [facility]; advised refill request had come in for zyrtec and diclofenac [facility] and was [facility]; pt said diclofenac was pts husbands med and pt would contact [facility].

## 2016-04-06 DIAGNOSIS — M1611 Unilateral primary osteoarthritis, right hip: Secondary | ICD-10-CM | POA: Diagnosis not present

## 2016-05-10 ENCOUNTER — Telehealth: Payer: Self-pay | Admitting: Family Medicine

## 2016-05-10 NOTE — Telephone Encounter (Signed)
LM for pt to sch AWV and CPE, mn °

## 2016-06-21 ENCOUNTER — Other Ambulatory Visit: Payer: Self-pay | Admitting: Family Medicine

## 2016-06-21 NOTE — Telephone Encounter (Signed)
Last office visit 02/04/2016 with Webb Silversmith.  Last refilled 03/23/2016 for #180 with no refills.  Ok to refill?

## 2016-07-06 ENCOUNTER — Telehealth: Payer: Self-pay | Admitting: Family Medicine

## 2016-07-06 ENCOUNTER — Other Ambulatory Visit (INDEPENDENT_AMBULATORY_CARE_PROVIDER_SITE_OTHER): Payer: Medicare Other

## 2016-07-06 DIAGNOSIS — M810 Age-related osteoporosis without current pathological fracture: Secondary | ICD-10-CM | POA: Diagnosis not present

## 2016-07-06 DIAGNOSIS — E7800 Pure hypercholesterolemia, unspecified: Secondary | ICD-10-CM

## 2016-07-06 DIAGNOSIS — E538 Deficiency of other specified B group vitamins: Secondary | ICD-10-CM

## 2016-07-06 LAB — VITAMIN D 25 HYDROXY (VIT D DEFICIENCY, FRACTURES): VITD: 74.76 ng/mL (ref 30.00–100.00)

## 2016-07-06 LAB — LIPID PANEL
Cholesterol: 172 mg/dL (ref 0–200)
HDL: 55.1 mg/dL (ref >39.00)
LDL Cholesterol: 102 mg/dL — ABNORMAL HIGH (ref 0–99)
NONHDL: 116.74
Total CHOL/HDL Ratio: 3
Triglycerides: 74 mg/dL (ref 0.0–149.0)
VLDL: 14.8 mg/dL (ref 0.0–40.0)

## 2016-07-06 LAB — COMPREHENSIVE METABOLIC PANEL
ALK PHOS: 69 U/L (ref 39–117)
ALT: 18 U/L (ref 0–35)
AST: 26 U/L (ref 0–37)
Albumin: 3.9 g/dL (ref 3.5–5.2)
BILIRUBIN TOTAL: 0.8 mg/dL (ref 0.2–1.2)
BUN: 19 mg/dL (ref 6–23)
CO2: 27 mEq/L (ref 19–32)
CREATININE: 0.72 mg/dL (ref 0.40–1.20)
Calcium: 9.6 mg/dL (ref 8.4–10.5)
Chloride: 105 mEq/L (ref 96–112)
GFR: 82.24 mL/min (ref >60.00)
GLUCOSE: 86 mg/dL (ref 70–99)
Potassium: 3.9 mEq/L (ref 3.5–5.1)
SODIUM: 140 meq/L (ref 135–145)
TOTAL PROTEIN: 6.9 g/dL (ref 6.0–8.3)

## 2016-07-06 LAB — VITAMIN B12: VITAMIN B 12: 158 pg/mL — AB (ref 211–911)

## 2016-07-06 NOTE — Telephone Encounter (Signed)
-----   Message from [name] sent at 06/30/2016 11:10 AM EDT ----- Regarding: Lab orders for Tuesday, 10.10.17 Patient is scheduled for CPX labs, please order future labs, Thanks , [name]

## 2016-07-08 ENCOUNTER — Ambulatory Visit: Payer: Self-pay | Admitting: Orthopedic Surgery

## 2016-07-13 ENCOUNTER — Ambulatory Visit (INDEPENDENT_AMBULATORY_CARE_PROVIDER_SITE_OTHER): Payer: Medicare Other | Admitting: Family Medicine

## 2016-07-13 ENCOUNTER — Encounter: Payer: Self-pay | Admitting: Family Medicine

## 2016-07-13 ENCOUNTER — Encounter: Payer: Medicare Other | Admitting: Family Medicine

## 2016-07-13 VITALS — BP 118/78 | HR 60 | Temp 98.3°F | Ht 66.75 in | Wt 129.5 lb

## 2016-07-13 DIAGNOSIS — Z01818 Encounter for other preprocedural examination: Secondary | ICD-10-CM | POA: Diagnosis not present

## 2016-07-13 DIAGNOSIS — Z0000 Encounter for general adult medical examination without abnormal findings: Secondary | ICD-10-CM

## 2016-07-13 DIAGNOSIS — I440 Atrioventricular block, first degree: Secondary | ICD-10-CM | POA: Diagnosis not present

## 2016-07-13 DIAGNOSIS — Z1231 Encounter for screening mammogram for malignant neoplasm of breast: Secondary | ICD-10-CM

## 2016-07-13 DIAGNOSIS — E7800 Pure hypercholesterolemia, unspecified: Secondary | ICD-10-CM

## 2016-07-13 DIAGNOSIS — Z23 Encounter for immunization: Secondary | ICD-10-CM | POA: Diagnosis not present

## 2016-07-13 DIAGNOSIS — M810 Age-related osteoporosis without current pathological fracture: Secondary | ICD-10-CM | POA: Diagnosis not present

## 2016-07-13 DIAGNOSIS — R0789 Other chest pain: Secondary | ICD-10-CM

## 2016-07-13 DIAGNOSIS — Z1239 Encounter for other screening for malignant neoplasm of breast: Secondary | ICD-10-CM

## 2016-07-13 DIAGNOSIS — Z789 Other specified health status: Secondary | ICD-10-CM

## 2016-07-13 NOTE — Assessment & Plan Note (Signed)
Stable on past EKGs.

## 2016-07-13 NOTE — Progress Notes (Signed)
Pre visit review using our clinic review tool, if applicable. No additional management support is needed unless otherwise documented below in the visit note. 

## 2016-07-13 NOTE — Patient Instructions (Addendum)
Start B12 1000 mcg daily  Work on low cholesterol diet.. Decrease animal fats.  Keep up on healthy diet.. Do not skip meals.. Increase protein and veggies in diet.  Stopa t front desk for referral to bone density, mammo and stress test

## 2016-07-13 NOTE — Progress Notes (Signed)
HPI  I have personally reviewed the Medicare Annual Wellness questionnaire and have noted 1.         The patient's medical and social history 2.         Their use of alcohol, tobacco or illicit drugs 3.         Their current medications and supplements 4.         The patient's functional ability including ADL's, fall risks, home safety risks and hearing or visual             impairment. 5.         Diet and physical activities 6.         Evidence for depression or mood disorders 7.         Updated provider list Cognitive evaluation was performed and recorded on pt medicare questionnaire form. The patients weight, height, BMI and visual acuity have been recorded in the chart  I have made referrals, counseling and provided education to the patient based review of the above and I have provided the pt with a written personalized care plan for preventive services.   Pt has upcoming surgery  Right THR planned on  10/13/2016 Past cardiac eval: EKG 01/2016 first degree AV block and PACs. Stable from 2015. Good energy overall. No chest pain,but did have chest pressure earlier in year off and on, no SOB.  Last surgery was 87-8 years ago.. No complications.   Notes left foot swollen when gets up in AM. Ongoing x several months. Off and on worse.  No leg pain.  Elevated Cholesterol: LDL at goal < 130 on zocor 40 mg daily  Lab Results  Component Value Date   CHOL 172 07/06/2016   HDL 55.10 07/06/2016   LDLCALC 102 (H) 07/06/2016   LDLDIRECT 116.9 09/25/2008   TRIG 74.0 07/06/2016   CHOLHDL 3 07/06/2016   Using medications without problems:  None Muscle aches: None  Diet compliance:Good  Exercise: Has not been walking in last few weeks given hip and knee. Walks a lot at work. Other complaints:   Wt Readings from Last 3 Encounters:  07/13/16 129 lb 8 oz (58.7 kg)  02/04/16 134 lb (60.8 kg)  08/12/15 134 lb (60.8 kg)    Body mass index is 20.43 kg/m.  Osteopenia, worsened last  check: on no med, previously on boniva  Refused further treatment.  VIt B12 low.. Not on supplement.   Social History /Family History/Past Medical History reviewed and updated if needed.   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  Review of Systems  Constitutional: Negative for fever, fatigue and unexpected weight change.  HENT: Positive for rhinorrhea. Negative for ear pain, congestion, sore throat, sneezing, trouble swallowing and sinus pressure.  Eyes: Negative for pain and itching.  Respiratory: Negative for cough, shortness of breath and wheezing.  Cardiovascular: Negative for chest pain, palpitations and leg swelling.  Gastrointestinal: Negative for nausea, abdominal pain, diarrhea, constipation and blood in stool.  Genitourinary: Negative for dysuria, hematuria, vaginal discharge, difficulty urinating and menstrual problem.  Skin: Negative for rash.  Neurological: Negative for syncope, weakness, light-headedness, numbness and headaches.  Psychiatric/Behavioral: Negative for confusion and dysphoric mood. The patient is not nervous/anxious.  Objective:   Physical Exam  Constitutional: Vital signs are normal. She appears well-developed and well-nourished. She is cooperative. Non-toxic appearance. She does not appear ill. No distress.  HENT:  Head: Normocephalic.  Right Ear: Hearing, tympanic membrane, external ear and ear canal normal.  Left Ear:  Hearing, tympanic membrane, external ear and ear canal normal.  Nose: Nose normal.  Eyes: Conjunctivae, EOM and lids are normal. Pupils are equal, round, and reactive to light. No foreign bodies found.  Neck: Trachea normal and normal range of motion. Neck supple. Carotid bruit is not present. No mass and no thyromegaly present.  Cardiovascular: Normal rate, Regular rhythm, S1 normal, S2 normal, normal heart sounds and intact distal pulses. Exam reveals no gallop.  No murmur. Pulmonary/Chest: Effort normal and breath  sounds normal. No respiratory distress. She has no wheezes. She has no rhonchi. She has no rales.  Abdominal: Soft. Normal appearance and bowel sounds are normal. She exhibits no distension, no fluid wave, no abdominal bruit and no mass. There is no hepatosplenomegaly. There is no tenderness. There is no rebound, no guarding and no CVA tenderness. No hernia.  Genitourinary: not examined. No pap/DVE indicated  Lymphadenopathy:  She has no cervical adenopathy.  She has no axillary adenopathy.  Neurological: She is alert. She has normal strength. No cranial nerve deficit or sensory deficit.  Skin: Skin is warm, dry and intact. No rash noted.  Psychiatric: Her speech is normal and behavior is normal. Judgment normal. Her mood appears not anxious. Cognition and memory are normal. She does not exhibit a depressed mood.  Assessment & Plan:   The patient's preventative maintenance and recommended screening tests for an annual wellness exam were reviewed in full today.  Brought up to date unless services declined.  Counselled on the importance of diet, exercise, and its role in overall health and mortality.  The patient's FH and SH was reviewed, including their home life, tobacco status, and drug and alcohol status.   Nonsmoker  Vaccines Uptodate , refused td and will get flu today. Colon: Dr. Henrene Pastor, 2 polyps 09/2008,no further indicated due to age. DEXA: 2015 worsened osteoporosis, on no med, pt refused 05/2014. She is now off vit D because she got to high. Now in nml range. Mammogram: 05/2014 nml, no further indicated, she request to continue given healthy. DVE/PAP: no indication due to age.

## 2016-07-13 NOTE — Assessment & Plan Note (Signed)
Given age and chest pain issues earlier in year.. Will eval with cardiac stress test prior to  Upcoming operation.

## 2016-07-13 NOTE — Assessment & Plan Note (Signed)
Encouraged exercise, weight loss, healthy eating habits. ? ?

## 2016-07-13 NOTE — Addendum Note (Signed)
Addended by: Emelia Salisbury C on: 07/13/2016 12:07 PM   Modules accepted: Orders

## 2016-07-27 DIAGNOSIS — M8589 Other specified disorders of bone density and structure, multiple sites: Secondary | ICD-10-CM | POA: Diagnosis not present

## 2016-07-27 DIAGNOSIS — M810 Age-related osteoporosis without current pathological fracture: Secondary | ICD-10-CM | POA: Diagnosis not present

## 2016-07-27 DIAGNOSIS — Z1231 Encounter for screening mammogram for malignant neoplasm of breast: Secondary | ICD-10-CM | POA: Diagnosis not present

## 2016-07-27 DIAGNOSIS — Z853 Personal history of malignant neoplasm of breast: Secondary | ICD-10-CM | POA: Diagnosis not present

## 2016-07-28 ENCOUNTER — Encounter: Payer: Self-pay | Admitting: Family Medicine

## 2016-07-29 ENCOUNTER — Telehealth (HOSPITAL_COMMUNITY): Payer: Self-pay | Admitting: *Deleted

## 2016-07-29 NOTE — Telephone Encounter (Signed)
Patient given detailed instructions per Myocardial Perfusion Study Information Sheet for the test on  08/03/16. Patient notified to arrive 15 minutes early and that it is imperative to arrive on time for appointment to keep from having the test rescheduled.  If you need to cancel or reschedule your appointment, please call the office within 24 hours of your appointment. Failure to do so may result in a cancellation of your appointment, and a $50 no show fee. Patient verbalized understanding.  Hubbard Robinson, RN

## 2016-08-03 ENCOUNTER — Ambulatory Visit (HOSPITAL_COMMUNITY): Payer: Medicare Other | Attending: Cardiology

## 2016-08-03 ENCOUNTER — Encounter: Payer: Self-pay | Admitting: Family Medicine

## 2016-08-03 DIAGNOSIS — R9439 Abnormal result of other cardiovascular function study: Secondary | ICD-10-CM | POA: Insufficient documentation

## 2016-08-03 DIAGNOSIS — Z01818 Encounter for other preprocedural examination: Secondary | ICD-10-CM

## 2016-08-03 DIAGNOSIS — R079 Chest pain, unspecified: Secondary | ICD-10-CM | POA: Insufficient documentation

## 2016-08-03 DIAGNOSIS — R0789 Other chest pain: Secondary | ICD-10-CM

## 2016-08-03 DIAGNOSIS — N6311 Unspecified lump in the right breast, upper outer quadrant: Secondary | ICD-10-CM | POA: Diagnosis not present

## 2016-08-03 DIAGNOSIS — I440 Atrioventricular block, first degree: Secondary | ICD-10-CM | POA: Diagnosis not present

## 2016-08-03 DIAGNOSIS — R0602 Shortness of breath: Secondary | ICD-10-CM | POA: Diagnosis not present

## 2016-08-03 DIAGNOSIS — R42 Dizziness and giddiness: Secondary | ICD-10-CM | POA: Insufficient documentation

## 2016-08-03 DIAGNOSIS — N6001 Solitary cyst of right breast: Secondary | ICD-10-CM | POA: Diagnosis not present

## 2016-08-03 LAB — MYOCARDIAL PERFUSION IMAGING
CHL CUP RESTING HR STRESS: 60 {beats}/min
CSEPPHR: 83 {beats}/min
LV dias vol: 112 mL (ref 46–106)
LVSYSVOL: 49 mL
RATE: 0.33
SDS: 5
SRS: 11
SSS: 14
TID: 0.92

## 2016-08-03 MED ORDER — REGADENOSON 0.4 MG/5ML IV SOLN
0.4000 mg | Freq: Once | INTRAVENOUS | Status: AC
  Administered 2016-08-03: 0.4 mg via INTRAVENOUS

## 2016-08-03 MED ORDER — TECHNETIUM TC 99M TETROFOSMIN IV KIT
10.6000 | PACK | Freq: Once | INTRAVENOUS | Status: AC | PRN
  Administered 2016-08-03: 10.6 via INTRAVENOUS
  Filled 2016-08-03: qty 11

## 2016-08-03 MED ORDER — TECHNETIUM TC 99M TETROFOSMIN IV KIT
31.5000 | PACK | Freq: Once | INTRAVENOUS | Status: AC | PRN
  Administered 2016-08-03: 31.5 via INTRAVENOUS
  Filled 2016-08-03: qty 32

## 2016-08-04 ENCOUNTER — Encounter: Payer: Self-pay | Admitting: Family Medicine

## 2016-08-06 ENCOUNTER — Telehealth: Payer: Self-pay | Admitting: Family Medicine

## 2016-08-06 NOTE — Telephone Encounter (Signed)
Called pt to discuss results of stress test and need for  Cardiology referral

## 2016-08-10 ENCOUNTER — Telehealth: Payer: Self-pay | Admitting: Family Medicine

## 2016-08-10 DIAGNOSIS — I440 Atrioventricular block, first degree: Secondary | ICD-10-CM

## 2016-08-10 DIAGNOSIS — R9439 Abnormal result of other cardiovascular function study: Secondary | ICD-10-CM

## 2016-08-10 DIAGNOSIS — Z01818 Encounter for other preprocedural examination: Secondary | ICD-10-CM

## 2016-08-10 NOTE — Telephone Encounter (Signed)
Spoke with pt about results. She is agreeable to cardiology referral for pre-op eval and recommendation given abnormal stress test.

## 2016-08-14 ENCOUNTER — Other Ambulatory Visit: Payer: Self-pay | Admitting: Family Medicine

## 2016-09-15 NOTE — Progress Notes (Signed)
Please update surgical orders; orders will expire after 90 days in epic. Thanks.  

## 2016-09-21 ENCOUNTER — Ambulatory Visit (INDEPENDENT_AMBULATORY_CARE_PROVIDER_SITE_OTHER): Payer: Medicare Other | Admitting: Cardiovascular Disease

## 2016-09-21 ENCOUNTER — Encounter: Payer: Self-pay | Admitting: Cardiovascular Disease

## 2016-09-21 VITALS — BP 146/80 | HR 64 | Ht 66.0 in | Wt 129.0 lb

## 2016-09-21 DIAGNOSIS — R252 Cramp and spasm: Secondary | ICD-10-CM

## 2016-09-21 DIAGNOSIS — Z01818 Encounter for other preprocedural examination: Secondary | ICD-10-CM | POA: Diagnosis not present

## 2016-09-21 DIAGNOSIS — E7800 Pure hypercholesterolemia, unspecified: Secondary | ICD-10-CM

## 2016-09-21 DIAGNOSIS — I440 Atrioventricular block, first degree: Secondary | ICD-10-CM

## 2016-09-21 DIAGNOSIS — R9439 Abnormal result of other cardiovascular function study: Secondary | ICD-10-CM

## 2016-09-21 NOTE — Progress Notes (Signed)
Cardiology Office Note   Date:  09/21/2016   ID:  Kathryn [name], DOB 07-02-1931, MRN [id number]  PCP:  Eliezer Lofts, MD  Cardiologist:   Skeet Latch, MD   Chief Complaint  Patient presents with  . Follow-up    New patient.      History of Present Illness: Kathryn [name] is a 82 y.o. female with hyperlipidemia and palpitations who presents for evaluation of an abnormal stress test.  Kathryn [name] saw Dr. Daleen Squibb on 10/17 prior to a right hip replacement.  She is scheduled to have surgery 10/13/16.  She reported intermittent episodes of chest pressure throughout the preceding year.  She was referred for a Lexiscan Myoview 08/03/16 that revealed LVEF 56% with apical septal hypokinesis. She was also noted to have a fixed defect in the basal-mid inferolateral, mid anteroseptal, and apical septal regions. There was no evidence of ischemia.  Kathryn [name] has been feeling well.  She denies chest pain or shortness of breath.  She denies any recent chest pain.  She walks for 2 miles approximately twice per week.  She notes chronic swelling in her L foot since her left hip replacement.  She denies orthopnea or PND.     Past Medical History:  Diagnosis Date  . Allergy   . Arthritis   . Colon polyps   . Hyperlipidemia   . Irregular heart rate     Past Surgical History:  Procedure Laterality Date  . CARDIAC CATHETERIZATION    . HAND SURGERY Left   . TOTAL HIP ARTHROPLASTY  2007   LEFT  . TOTAL KNEE ARTHROPLASTY     Right   . TUBAL LIGATION       Current Outpatient Prescriptions  Medication Sig Dispense Refill  . calcium carbonate (OS-CAL) 600 MG TABS Take 600 mg by mouth 2 (two) times daily with a meal.      . cetirizine (ZYRTEC) 10 MG tablet TAKE 1 TABLET (10 MG TOTAL) BY MOUTH DAILY. 90 tablet 3  . cholecalciferol (VITAMIN D) 400 units TABS tablet Take 400 Units by mouth daily.    . cyclobenzaprine (FLEXERIL) 5 MG tablet TAKE 1 TABLET AT BEDTIME AS NEEDED FOR MUSCLE SPASMS 90  tablet 0  . diclofenac (VOLTAREN) 75 MG EC tablet TAKE 1 TABLET TWICE A DAY 180 tablet 0  . simvastatin (ZOCOR) 40 MG tablet TAKE 1 TABLET AT BEDTIME 90 tablet 3   No current facility-administered medications for this visit.     Allergies:   Macrobid [nitrofurantoin monohyd macro]    Social History:  The patient  reports that she has never smoked. She has never used smokeless tobacco. She reports that she drinks about 1.8 oz of alcohol per week . She reports that she does not use drugs.   Family History:  The patient's family history includes Bone cancer in her sister; Breast cancer in her sister; Heart attack in her brother; Heart disease in her brother, brother, and sister; Hypertension in her brother and sister; Pancreatic cancer in her mother.    ROS:  Please see the history of present illness.   Otherwise, review of systems are positive for none.   All other systems are reviewed and negative.    PHYSICAL EXAM: VS:  BP (!) 146/80   Pulse 64   Ht 5\' 6"  (1.676 m)   Wt 58.5 kg (129 lb)   BMI 20.82 kg/m  , BMI Body mass index is 20.82 kg/m. GENERAL:  Well appearing  HEENT:  Pupils equal round and reactive, fundi not visualized, oral mucosa unremarkable NECK:  No jugular venous distention, waveform within normal limits, carotid upstroke brisk and symmetric, no bruits, no thyromegaly LYMPHATICS:  No cervical adenopathy LUNGS:  Clear to auscultation bilaterally HEART:  RRR.  PMI not displaced or sustained,S1 and S2 within normal limits, no S3, no S4, no clicks, no rubs, no murmurs ABD:  Flat, positive bowel sounds normal in frequency in pitch, no bruits, no rebound, no guarding, no midline pulsatile mass, no hepatomegaly, no splenomegaly EXT:  2 plus pulses throughout, 1+ pitting edema to L ankle, no cyanosis no clubbing SKIN:  No rashes no nodules NEURO:  Cranial nerves II through XII grossly intact, motor grossly intact throughout PSYCH:  Cognitively intact, oriented to person place  and time   EKG:  EKG is ordered today. The ekg ordered today demonstrates sinus rhythm rate 64 bpm.  RSR' V1.  Prior anteroseptal infarct.   Lexiscan Myoview 08/03/16:  Nuclear stress EF: 56%. Apical septal wall hypokinesis  Defect 1: There is a medium defect of severe severity present in the basal inferolateral, mid anteroseptal, mid inferolateral and apical septal location. Defect is fixed consistent with infarct.  Findings consistent with prior myocardial infarction.  There was no ST segment deviation noted during stress.  This is an intermediate risk study. Infarct pattern as described above. No ischemia.  Recent Labs: 02/04/2016: Hemoglobin 10.8; Platelets 252.0; TSH 1.35 07/06/2016: ALT 18; BUN 19; Creatinine, Ser 0.72; Potassium 3.9; Sodium 140    Lipid Panel    Component Value Date/Time   CHOL 172 07/06/2016 1107   TRIG 74.0 07/06/2016 1107   HDL 55.10 07/06/2016 1107   CHOLHDL 3 07/06/2016 1107   VLDL 14.8 07/06/2016 1107   LDLCALC 102 (H) 07/06/2016 1107   LDLDIRECT 116.9 09/25/2008 1148      Wt Readings from Last 3 Encounters:  09/21/16 58.5 kg (129 lb)  08/03/16 58.5 kg (129 lb)  07/13/16 58.7 kg (129 lb 8 oz)      ASSESSMENT AND PLAN:  # Abnormal stress test: Kathryn [name]'s stress test was consistent with prior MI In the inferolateral and anteroseptal regions. This is somewhat atypical, especially given that there is no evidence of prior infarct on her EKG and that she is relatively asymptomatic. She is reluctant to start an aspirin a she reports easy bleeding at baseline. We will obtain a cardiac CT-A to better evaluate her coronaries. If it does show that she in fact has coronary artery disease, we will start aspirin 81 mg daily and change her statin with a goal of an LDL less than 70.  Given that she has no ischemia on stress testing and no symptoms, she is okay to proceed with surgery at this time.  If she has to start aspirin, she will need to stop voltaran.     # Hyperlipidemia:  Continue simvastatin for now.    Current medicines are reviewed at length with the patient today.  The patient does not have concerns regarding medicines.  The following changes have been made:  no change  Labs/ tests ordered today include:   Orders Placed This Encounter  Procedures  . CT CORONARY MORPH W/CTA COR W/SCORE W/CA W/CM &/OR WO/CM  . Basic metabolic panel  . EKG 12-Lead     Disposition:   FU with Tjay Velazquez C. Oval Linsey, MD, [facility] in 1 month    This note was written with the assistance of speech recognition software.  Please  excuse any transcriptional errors.  Signed, Darel Ricketts C. Oval Linsey, MD, [facility]  09/21/2016 9:13 AM    [place]

## 2016-09-21 NOTE — Patient Instructions (Signed)
Medication Instructions:  NO CHANGES   If you need a refill on your cardiac medications before your next appointment, please call your pharmacy.  Labwork: BMP AT [facility] BEFORE CT  Testing/Procedures: Your physician has requested that you have cardiac CT. Cardiac computed tomography (CT) is a painless test that uses an x-ray machine to take clear, detailed pictures of your heart. For further information please visit [email or web address]. Please follow instruction sheet as given.     Follow-Up: Your physician recommends that you schedule a follow-up appointment in: [address] [facility]     Thank you for choosing [facility]!!   DR [facility] RANOLPH  M.D.

## 2016-09-23 ENCOUNTER — Encounter: Payer: Self-pay | Admitting: Cardiovascular Disease

## 2016-09-28 DIAGNOSIS — R9439 Abnormal result of other cardiovascular function study: Secondary | ICD-10-CM | POA: Diagnosis not present

## 2016-09-29 LAB — BASIC METABOLIC PANEL
BUN: 29 mg/dL — ABNORMAL HIGH (ref 7–25)
CALCIUM: 9.2 mg/dL (ref 8.6–10.4)
CO2: 26 mmol/L (ref 20–31)
Chloride: 109 mmol/L (ref 98–110)
Creat: 0.66 mg/dL (ref 0.60–0.88)
Glucose, Bld: 89 mg/dL (ref 65–99)
Potassium: 4.9 mmol/L (ref 3.5–5.3)
SODIUM: 140 mmol/L (ref 135–146)

## 2016-09-30 ENCOUNTER — Other Ambulatory Visit: Payer: Self-pay | Admitting: Orthopedic Surgery

## 2016-09-30 DIAGNOSIS — R0989 Other specified symptoms and signs involving the circulatory and respiratory systems: Secondary | ICD-10-CM

## 2016-10-04 ENCOUNTER — Encounter (HOSPITAL_COMMUNITY)
Admission: RE | Admit: 2016-10-04 | Discharge: 2016-10-04 | Disposition: A | Discharge status: Home / Self Care | Payer: Medicare Other | Source: Ambulatory Visit | Attending: Orthopedic Surgery | Admitting: Orthopedic Surgery

## 2016-10-04 ENCOUNTER — Encounter (HOSPITAL_COMMUNITY): Payer: Self-pay

## 2016-10-04 DIAGNOSIS — I440 Atrioventricular block, first degree: Secondary | ICD-10-CM | POA: Diagnosis not present

## 2016-10-04 DIAGNOSIS — I700 Atherosclerosis of aorta: Secondary | ICD-10-CM | POA: Diagnosis not present

## 2016-10-04 DIAGNOSIS — Z01818 Encounter for other preprocedural examination: Secondary | ICD-10-CM | POA: Diagnosis not present

## 2016-10-04 DIAGNOSIS — I712 Thoracic aortic aneurysm, without rupture: Secondary | ICD-10-CM | POA: Diagnosis not present

## 2016-10-04 DIAGNOSIS — R9439 Abnormal result of other cardiovascular function study: Secondary | ICD-10-CM | POA: Diagnosis not present

## 2016-10-04 DIAGNOSIS — Z01812 Encounter for preprocedural laboratory examination: Secondary | ICD-10-CM | POA: Diagnosis not present

## 2016-10-04 DIAGNOSIS — I2510 Atherosclerotic heart disease of native coronary artery without angina pectoris: Secondary | ICD-10-CM | POA: Diagnosis not present

## 2016-10-04 DIAGNOSIS — M1611 Unilateral primary osteoarthritis, right hip: Secondary | ICD-10-CM | POA: Diagnosis not present

## 2016-10-04 DIAGNOSIS — Z0183 Encounter for blood typing: Secondary | ICD-10-CM | POA: Diagnosis not present

## 2016-10-04 HISTORY — DX: Personal history of other medical treatment: Z92.89

## 2016-10-04 HISTORY — DX: Other specified postprocedural states: Z98.890

## 2016-10-04 HISTORY — DX: Other specified postprocedural states: R11.2

## 2016-10-04 LAB — COMPREHENSIVE METABOLIC PANEL
ALBUMIN: 4.1 g/dL (ref 3.5–5.0)
ALK PHOS: 73 U/L (ref 38–126)
ALT: 20 U/L (ref 14–54)
AST: 28 U/L (ref 15–41)
Anion gap: 6 (ref 5–15)
BILIRUBIN TOTAL: 0.7 mg/dL (ref 0.3–1.2)
BUN: 22 mg/dL — AB (ref 6–20)
CALCIUM: 9 mg/dL (ref 8.9–10.3)
CO2: 27 mmol/L (ref 22–32)
Chloride: 107 mmol/L (ref 101–111)
Creatinine, Ser: 0.5 mg/dL (ref 0.44–1.00)
GFR calc Af Amer: >60 mL/min (ref >60)
GFR calc non Af Amer: >60 mL/min (ref >60)
GLUCOSE: 83 mg/dL (ref 65–99)
Potassium: 4.4 mmol/L (ref 3.5–5.1)
SODIUM: 140 mmol/L (ref 135–145)
Total Protein: 7.2 g/dL (ref 6.5–8.1)

## 2016-10-04 LAB — PROTIME-INR
INR: 0.96
Prothrombin Time: 12.8 seconds (ref 11.4–15.2)

## 2016-10-04 LAB — URINALYSIS, ROUTINE W REFLEX MICROSCOPIC
Bilirubin Urine: NEGATIVE
Glucose, UA: NEGATIVE mg/dL
Hgb urine dipstick: NEGATIVE
Ketones, ur: 5 mg/dL — AB
Leukocytes, UA: NEGATIVE
NITRITE: NEGATIVE
PROTEIN: NEGATIVE mg/dL
SPECIFIC GRAVITY, URINE: 1.02 (ref 1.005–1.030)
pH: 6 (ref 5.0–8.0)

## 2016-10-04 LAB — CBC
HEMATOCRIT: 33.3 % — AB (ref 36.0–46.0)
HEMOGLOBIN: 10.9 g/dL — AB (ref 12.0–15.0)
MCH: 31 pg (ref 26.0–34.0)
MCHC: 32.7 g/dL (ref 30.0–36.0)
MCV: 94.6 fL (ref 78.0–100.0)
Platelets: 288 10*3/uL (ref 150–400)
RBC: 3.52 MIL/uL — ABNORMAL LOW (ref 3.87–5.11)
RDW: 14.7 % (ref 11.5–15.5)
WBC: 6.8 10*3/uL (ref 4.0–10.5)

## 2016-10-04 LAB — SURGICAL PCR SCREEN
MRSA, PCR: NEGATIVE
STAPHYLOCOCCUS AUREUS: NEGATIVE

## 2016-10-04 LAB — APTT: APTT: 31 s (ref 24–36)

## 2016-10-04 NOTE — Progress Notes (Signed)
09/21/16 EKG in epic, 08/03/16 stress test in epic, 09/21/16 Card note clearance in epic.

## 2016-10-04 NOTE — Progress Notes (Addendum)
UA and CBC done 10/04/16 routed to DR. Aluisio via epic

## 2016-10-04 NOTE — Patient Instructions (Signed)
Kathryn [name]  10/04/2016   Your procedure is scheduled on: 10/13/16  Report to [facility] Main  Entrance take [place]  elevators to 3rd floor to  [place] at     [phone].  Call this number if you have problems the morning of surgery [phone]   Remember: ONLY 1 PERSON MAY GO WITH YOU TO SHORT STAY TO GET  READY MORNING OF YOUR SURGERY.  Do not eat food or drink liquids :After Midnight.     Take these medicines the morning of surgery with A SIP OF WATER: NONE                                You may not have any metal on your body including hair pins and              piercings  Do not wear jewelry, make-up, lotions, powders or perfumes, deodorant             Do not wear nail polish.  Do not shave  48 hours prior to surgery.                Do not bring valuables to the hospital. [place].  Contacts, dentures or bridgework may not be worn into surgery.  Leave suitcase in the car. After surgery it may be brought to your room.                 Please read over the following fact sheets you were given: _____________________________________________________________________             [facility] - Preparing for Surgery Before surgery, you can play an important role.  Because skin is not sterile, your skin needs to be as free of germs as possible.  You can reduce the number of germs on your skin by washing with CHG (chlorahexidine gluconate) soap before surgery.  CHG is an antiseptic cleaner which kills germs and bonds with the skin to continue killing germs even after washing. Please DO NOT use if you have an allergy to CHG or antibacterial soaps.  If your skin becomes reddened/irritated stop using the CHG and inform your nurse when you arrive at Short Stay. Do not shave (including legs and underarms) for at least 48 hours prior to the first CHG shower.  You may shave your face/neck. Please follow these  instructions carefully:  1.  Shower with CHG Soap the night before surgery and the  morning of Surgery.  2.  If you choose to wash your hair, wash your hair first as usual with your  normal  shampoo.  3.  After you shampoo, rinse your hair and body thoroughly to remove the  shampoo.                           4.  Use CHG as you would any other liquid soap.  You can apply chg directly  to the skin and wash                       Gently with a scrungie or clean washcloth.  5.  Apply the CHG Soap to your body ONLY  FROM THE NECK DOWN.   Do not use on face/ open                           Wound or open sores. Avoid contact with eyes, ears mouth and genitals (private parts).                       Wash face,  Genitals (private parts) with your normal soap.             6.  Wash thoroughly, paying special attention to the area where your surgery  will be performed.  7.  Thoroughly rinse your body with warm water from the neck down.  8.  DO NOT shower/wash with your normal soap after using and rinsing off  the CHG Soap.                9.  Pat yourself dry with a clean towel.            10.  Wear clean pajamas.            11.  Place clean sheets on your bed the night of your first shower and do not  sleep with pets. Day of Surgery : Do not apply any lotions/deodorants the morning of surgery.  Please wear clean clothes to the hospital/surgery center.  FAILURE TO FOLLOW THESE INSTRUCTIONS MAY RESULT IN THE CANCELLATION OF YOUR SURGERY PATIENT SIGNATURE_________________________________  NURSE SIGNATURE__________________________________  ________________________________________________________________________  WHAT IS A BLOOD TRANSFUSION? Blood Transfusion Information  A transfusion is the replacement of blood or some of its parts. Blood is made up of multiple cells which provide different functions.  Red blood cells carry oxygen and are used for blood loss replacement.  White blood cells fight against  infection.  Platelets control bleeding.  Plasma helps clot blood.  Other blood products are available for specialized needs, such as hemophilia or other clotting disorders. BEFORE THE TRANSFUSION  Who gives blood for transfusions?   Healthy volunteers who are fully evaluated to make sure their blood is safe. This is blood bank blood. Transfusion therapy is the safest it has ever been in the practice of medicine. Before blood is taken from a donor, a complete history is taken to make sure that person has no history of diseases nor engages in risky social behavior (examples are intravenous drug use or sexual activity with multiple partners). The donor's travel history is screened to minimize risk of transmitting infections, such as malaria. The donated blood is tested for signs of infectious diseases, such as HIV and hepatitis. The blood is then tested to be sure it is compatible with you in order to minimize the chance of a transfusion reaction. If you or a relative donates blood, this is often done in anticipation of surgery and is not appropriate for emergency situations. It takes many days to process the donated blood. RISKS AND COMPLICATIONS Although transfusion therapy is very safe and saves many lives, the main dangers of transfusion include:   Getting an infectious disease.  Developing a transfusion reaction. This is an allergic reaction to something in the blood you were given. Every precaution is taken to prevent this. The decision to have a blood transfusion has been considered carefully by your caregiver before blood is given. Blood is not given unless the benefits outweigh the risks. AFTER THE TRANSFUSION  Right after receiving a blood transfusion, you will usually feel much better  and more energetic. This is especially true if your red blood cells have gotten low (anemic). The transfusion raises the level of the red blood cells which carry oxygen, and this usually causes an energy  increase.  The nurse administering the transfusion will monitor you carefully for complications. HOME CARE INSTRUCTIONS  No special instructions are needed after a transfusion. You may find your energy is better. Speak with your caregiver about any limitations on activity for underlying diseases you may have. SEEK MEDICAL CARE IF:   Your condition is not improving after your transfusion.  You develop redness or irritation at the intravenous (IV) site. SEEK IMMEDIATE MEDICAL CARE IF:  Any of the following symptoms occur over the next 12 hours:  Shaking chills.  You have a temperature by mouth above 102 F (38.9 C), not controlled by medicine.  Chest, back, or muscle pain.  People around you feel you are not acting correctly or are confused.  Shortness of breath or difficulty breathing.  Dizziness and fainting.  You get a rash or develop hives.  You have a decrease in urine output.  Your urine turns a dark color or changes to pink, red, or brown. Any of the following symptoms occur over the next 10 days:  You have a temperature by mouth above 102 F (38.9 C), not controlled by medicine.  Shortness of breath.  Weakness after normal activity.  The white part of the eye turns yellow (jaundice).  You have a decrease in the amount of urine or are urinating less often.  Your urine turns a dark color or changes to pink, red, or brown. Document Released: 09/10/2000 Document Revised: 12/06/2011 Document Reviewed: 04/29/2008 ExitCare Patient Information 2014 [place].  _______________________________________________________________________  Incentive Spirometer  An incentive spirometer is a tool that can help keep your lungs clear and active. This tool measures how well you are filling your lungs with each breath. Taking long deep breaths may help reverse or decrease the chance of developing breathing (pulmonary) problems (especially infection) following:  A long  period of time when you are unable to move or be active. BEFORE THE PROCEDURE   If the spirometer includes an indicator to show your best effort, your nurse or respiratory therapist will set it to a desired goal.  If possible, sit up straight or lean slightly forward. Try not to slouch.  Hold the incentive spirometer in an upright position. INSTRUCTIONS FOR USE  1. Sit on the edge of your bed if possible, or sit up as far as you can in bed or on a chair. 2. Hold the incentive spirometer in an upright position. 3. Breathe out normally. 4. Place the mouthpiece in your mouth and seal your lips tightly around it. 5. Breathe in slowly and as deeply as possible, raising the piston or the ball toward the top of the column. 6. Hold your breath for 3-5 seconds or for as long as possible. Allow the piston or ball to fall to the bottom of the column. 7. Remove the mouthpiece from your mouth and breathe out normally. 8. Rest for a few seconds and repeat Steps 1 through 7 at least 10 times every 1-2 hours when you are awake. Take your time and take a few normal breaths between deep breaths. 9. The spirometer may include an indicator to show your best effort. Use the indicator as a goal to work toward during each repetition. 10. After each set of 10 deep breaths, practice coughing to be sure your  lungs are clear. If you have an incision (the cut made at the time of surgery), support your incision when coughing by placing a pillow or rolled up towels firmly against it. Once you are able to get out of bed, walk around indoors and cough well. You may stop using the incentive spirometer when instructed by your caregiver.  RISKS AND COMPLICATIONS  Take your time so you do not get dizzy or light-headed.  If you are in pain, you may need to take or ask for pain medication before doing incentive spirometry. It is harder to take a deep breath if you are having pain. AFTER USE  Rest and breathe slowly and  easily.  It can be helpful to keep track of a log of your progress. Your caregiver can provide you with a simple table to help with this. If you are using the spirometer at home, follow these instructions: [place] IF:   You are having difficultly using the spirometer.  You have trouble using the spirometer as often as instructed.  Your pain medication is not giving enough relief while using the spirometer.  You develop fever of 100.5 F (38.1 C) or higher. SEEK IMMEDIATE MEDICAL CARE IF:   You cough up bloody sputum that had not been present before.  You develop fever of 102 F (38.9 C) or greater.  You develop worsening pain at or near the incision site. MAKE SURE YOU:   Understand these instructions.  Will watch your condition.  Will get help right away if you are not doing well or get worse. Document Released: 01/24/2007 Document Revised: 12/06/2011 Document Reviewed: 03/27/2007 [facility] Patient Information 2014 [place], [place].   ________________________________________________________________________

## 2016-10-06 ENCOUNTER — Ambulatory Visit (HOSPITAL_COMMUNITY)
Admission: RE | Admit: 2016-10-06 | Discharge: 2016-10-06 | Disposition: A | Discharge status: Home / Self Care | Payer: Medicare Other | Source: Ambulatory Visit | Attending: Cardiovascular Disease | Admitting: Cardiovascular Disease

## 2016-10-06 ENCOUNTER — Telehealth: Payer: Self-pay

## 2016-10-06 ENCOUNTER — Encounter (HOSPITAL_COMMUNITY): Payer: Self-pay

## 2016-10-06 DIAGNOSIS — M1611 Unilateral primary osteoarthritis, right hip: Secondary | ICD-10-CM | POA: Diagnosis not present

## 2016-10-06 DIAGNOSIS — R079 Chest pain, unspecified: Secondary | ICD-10-CM | POA: Diagnosis not present

## 2016-10-06 DIAGNOSIS — I440 Atrioventricular block, first degree: Secondary | ICD-10-CM | POA: Insufficient documentation

## 2016-10-06 DIAGNOSIS — Z0183 Encounter for blood typing: Secondary | ICD-10-CM | POA: Diagnosis not present

## 2016-10-06 DIAGNOSIS — I712 Thoracic aortic aneurysm, without rupture: Secondary | ICD-10-CM | POA: Diagnosis not present

## 2016-10-06 DIAGNOSIS — R9439 Abnormal result of other cardiovascular function study: Secondary | ICD-10-CM | POA: Insufficient documentation

## 2016-10-06 DIAGNOSIS — Z01818 Encounter for other preprocedural examination: Secondary | ICD-10-CM | POA: Diagnosis not present

## 2016-10-06 DIAGNOSIS — I2510 Atherosclerotic heart disease of native coronary artery without angina pectoris: Secondary | ICD-10-CM | POA: Insufficient documentation

## 2016-10-06 DIAGNOSIS — Z01812 Encounter for preprocedural laboratory examination: Secondary | ICD-10-CM | POA: Diagnosis not present

## 2016-10-06 DIAGNOSIS — I700 Atherosclerosis of aorta: Secondary | ICD-10-CM | POA: Insufficient documentation

## 2016-10-06 MED ORDER — METOPROLOL TARTRATE 5 MG/5ML IV SOLN
INTRAVENOUS | Status: AC
  Filled 2016-10-06: qty 5

## 2016-10-06 MED ORDER — NITROGLYCERIN 0.4 MG SL SUBL
SUBLINGUAL_TABLET | SUBLINGUAL | Status: AC
  Filled 2016-10-06: qty 2

## 2016-10-06 MED ORDER — METOPROLOL TARTRATE 5 MG/5ML IV SOLN
5.0000 mg | Freq: Once | INTRAVENOUS | Status: AC
  Administered 2016-10-06: 5 mg via INTRAVENOUS

## 2016-10-06 MED ORDER — IOPAMIDOL (ISOVUE-370) INJECTION 76%
INTRAVENOUS | Status: AC
  Administered 2016-10-06: 80 mL
  Filled 2016-10-06: qty 100

## 2016-10-06 MED ORDER — NITROGLYCERIN 0.4 MG SL SUBL
0.8000 mg | SUBLINGUAL_TABLET | Freq: Once | SUBLINGUAL | Status: AC
  Administered 2016-10-06: 0.8 mg via SUBLINGUAL

## 2016-10-06 NOTE — Telephone Encounter (Signed)
Pt left v/m; pt scheduled to have hip replacement on 10/14/16. Dr Lucio's office request cb to let Dr Reynaldo Minium know that pt has been seen recently by Dr Diona Browner. Dr Diona Browner last saw pt annual 07/13/16. Pt request cb when done.

## 2016-10-07 ENCOUNTER — Telehealth: Payer: Self-pay | Admitting: Cardiovascular Disease

## 2016-10-07 NOTE — Telephone Encounter (Signed)
New Message    Pt said she is returning a phone call from Dr. Oval Linsey.

## 2016-10-07 NOTE — Telephone Encounter (Signed)
Let Dr. Reynaldo Minium know that pt was seen by me in 06/2016.Marland Kitchen Had EKG and stress test.. Referred to cardiology.  Saw cards on 12/26// note in epic.  medically cleared by me for surgery.. Needs cardiac clearance from cardiology.

## 2016-10-07 NOTE — Telephone Encounter (Signed)
Please advise - did you call this patient? Can't locate recent results or encounter.

## 2016-10-07 NOTE — Progress Notes (Signed)
Final CT of coronaries dones 10/06/16- EPIC

## 2016-10-07 NOTE — Telephone Encounter (Signed)
-----   Message from Skeet Latch, MD sent at 10/01/2016 11:55 PM EST ----- Normal kidney function and electrolytes.  OK to get CT scan.

## 2016-10-07 NOTE — Telephone Encounter (Signed)
Advised patient of lab results  

## 2016-10-07 NOTE — Telephone Encounter (Addendum)
Left message for Kathryn [name] at Dr. Jacquiline Doe office that Kathryn [name] was seen by Dr. Diona Browner in 10/17.  She is medically cleared by Dr. Diona Browner but also needs cardiology clearance.  Kathryn [name] notified that Dr. Jacquiline Doe office had been contacted as requested.

## 2016-10-12 ENCOUNTER — Ambulatory Visit: Payer: Self-pay | Admitting: Orthopedic Surgery

## 2016-10-12 NOTE — H&P (Signed)
Kathryn [name] DOB: [birth year] Married / Language: English / Race: White Female Date of Admission:  10/13/2016 CC:  Right Hip Pain History of Present Illness The patient is a 83 year old female who comes in for a preoperative History and Physical. The patient is scheduled for a right total hip arthroplasty (anterior) to be performed by Dr. Dione Plover. Aluisio, MD at [facility] on 10-13-2016. The patient is a 83 year old female presented 10 years out from left total hip arthroplasty. The patient states that she is doing very well at this time with regards to the left hip. The pain is under excellent control at this time and describes their pain as no pain. However, the patient reports right hip problems including pain (aching) and pt. is having trouble bending at the right hip symptoms that have been present for several month(s). The symptoms began without any known injury. Symptoms reported include hip pain The patient reports symptoms radiating to the: right groin and right thigh. The patient describes the hip problem as aching.The patient feels as if their symptoms are notes that they are unchanged. Current treatment includes application of heat and nonsteroidal anti-inflammatory drugs (Aleve).Note for "Hip problem": pain mostly at night She has had pain from her right hip down through her right knee. It has been going on for few months now. Getting progressively worse. It is very similar to the hip pain she had before we replaced her left hip years ago. She has pain at night as well as with activity. It is limiting her quite a bit.  AP pelvis and lateral of the right hip show bone-on-bone arthritis through the right hip. She also had x-rays, AP and lateral of the right knee. Her left hip prosthesis is in excellent position with no periprosthetic abnormalities. At this point, the most predictable means of improving pain and function is a right total hip arthroplasty. They have been treated  conservatively in the past for the above stated problem and despite conservative measures, they continue to have progressive pain and severe functional limitations and dysfunction. They have failed non-operative management including home exercise, medications, and injections. It is felt that they would benefit from undergoing total joint replacement. Risks and benefits of the procedure have been discussed with the patient and they elect to proceed with surgery. There are no active contraindications to surgery such as ongoing infection or rapidly progressive neurological disease.  Problem List/Past Medical S/P total knee arthroplasty (V43.65)  S/P total hip arthroplasty [id number])  Pain of right hip joint (M25.551)  Hypercholesterolemia  Rheumatoid Arthritis  Measles  Mumps   Allergies  No Known Drug Allergies   Family History Cancer  sister Congestive Heart Failure  brother Heart Disease  brother Hypertension  brother  Social History  Alcohol use  Occasional alcohol use, Drinks wine. 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 never smoker  Medication History Zocor (Oral) Specific strength unknown - Active. Calcium Complex (Oral) Active. Zyrtec Active.  Voltaren (75MG  Tablet DR, Oral) Active.   Past Surgical History  Total Hip Replacement  left Total Knee Replacement  right  Review of Systems  General Not Present- Chills, Fatigue,  Fever, Memory Loss, Night Sweats, Weight Gain and Weight Loss. Skin Not Present- Eczema, Hives, Itching, Lesions and Rash. HEENT Not Present- Dentures, Double Vision, Headache, Hearing Loss, Tinnitus and Visual Loss. Respiratory Not Present- Allergies, Chronic Cough, Coughing up blood, Shortness of breath at rest and Shortness of breath with exertion. Cardiovascular Not Present- Chest Pain, Difficulty Breathing Lying Down, Murmur, Palpitations, Racing/skipping heartbeats and Swelling. Gastrointestinal Not Present- Abdominal Pain, Bloody Stool, Constipation, Diarrhea, Difficulty Swallowing, Heartburn, Jaundice, Loss of appetitie, Nausea and Vomiting. Female Genitourinary Not Present- Blood in Urine, Discharge, Flank Pain, Incontinence, Painful Urination, Urgency, Urinary frequency, Urinary Retention, Urinating at Night and Weak urinary stream. Musculoskeletal Present- Joint Pain. Not Present- Back Pain, Joint Swelling, Morning Stiffness, Muscle Pain, Muscle Weakness and Spasms. Neurological Not Present- Blackout spells, Difficulty with balance, Dizziness, Paralysis, Tremor and Weakness. Psychiatric Not Present- Insomnia.  Vitals Weight: 135 lb Height: 62.5in Weight was reported by patient. Height was reported by patient. Body Surface Area: 1.63 m Body Mass Index: 24.3 kg/m  Pulse: 68 (Regular)  BP: 142/76 (Sitting, Right Arm, Standard)   Physical Exam  General Mental Status -Alert, cooperative and good historian. General Appearance-pleasant, Not in acute distress. Orientation-Oriented X3. Build & Nutrition-Well nourished and Well developed.  Head and Neck Head-normocephalic, atraumatic . Neck Global Assessment - bruit auscultated on the right, supple, no bruit auscultated on the left.  Eye Vision-Wears contact lenses. Pupil - Bilateral-Regular and Round. Motion - Bilateral-EOMI.  Chest and Lung Exam Auscultation Breath sounds - clear at anterior chest  wall and clear at posterior chest wall. Adventitious sounds - No Adventitious sounds.  Cardiovascular Auscultation Rhythm - Regular rate and rhythm. Heart Sounds - S1 WNL and S2 WNL. Murmurs & Other Heart Sounds - Auscultation of the heart reveals - No Murmurs.  Abdomen Palpation/Percussion Tenderness - Abdomen is non-tender to palpation. Rigidity (guarding) - Abdomen is soft. Auscultation Auscultation of the abdomen reveals - Bowel sounds normal.  Female Genitourinary Note: Not done, not pertinent to present illness   Musculoskeletal Note: She is in no distress. Right hip can be flexed to 100. Minimal internal rotation, about 10 to 20 of external rotation, 20 of abduction, all with pain. Her right knee shows no swelling. Range of about 3 to 125 with no tenderness or instability.  RADIOGRAPHS AP pelvis and lateral of the right hip show bone-on-bone arthritis through the right hip. She also had x-rays, AP and lateral of the right knee. Her prosthesis is in excellent position with no periprosthetic abnormalities.    Assessment & Plan Primary osteoarthritis of right hip (M16.11)  Note:Surgical Plans: Right Total Hip Replacement - Anterior Approach  Disposition: Home  PCP: Dr. Diona Browner Cards: Dr. Oval Linsey  IV TXA  Anesthesia Issues: None  At the time of her H&P and prior to her surgery, a carotid doppler was ordered and pending.  Signed electronically by Ok Edwards, III PA-C

## 2016-10-13 ENCOUNTER — Encounter (HOSPITAL_COMMUNITY): Payer: Self-pay | Admitting: *Deleted

## 2016-10-13 ENCOUNTER — Inpatient Hospital Stay (HOSPITAL_COMMUNITY): Payer: Medicare Other

## 2016-10-13 ENCOUNTER — Inpatient Hospital Stay (HOSPITAL_COMMUNITY)
Admission: RE | Admit: 2016-10-13 | Discharge: 2016-10-15 | DRG: 470 | Disposition: A | Discharge status: Home Health | Payer: Medicare Other | Source: Ambulatory Visit | Attending: Orthopedic Surgery | Admitting: Orthopedic Surgery

## 2016-10-13 ENCOUNTER — Inpatient Hospital Stay (HOSPITAL_COMMUNITY): Payer: Medicare Other | Admitting: Anesthesiology

## 2016-10-13 ENCOUNTER — Encounter (HOSPITAL_COMMUNITY)
Admission: RE | Disposition: A | Discharge status: Home Health | Payer: Self-pay | Source: Ambulatory Visit | Attending: Orthopedic Surgery

## 2016-10-13 DIAGNOSIS — Z882 Allergy status to sulfonamides status: Secondary | ICD-10-CM | POA: Diagnosis not present

## 2016-10-13 DIAGNOSIS — E785 Hyperlipidemia, unspecified: Secondary | ICD-10-CM | POA: Diagnosis present

## 2016-10-13 DIAGNOSIS — Z96649 Presence of unspecified artificial hip joint: Secondary | ICD-10-CM

## 2016-10-13 DIAGNOSIS — Z79899 Other long term (current) drug therapy: Secondary | ICD-10-CM | POA: Diagnosis not present

## 2016-10-13 DIAGNOSIS — N390 Urinary tract infection, site not specified: Secondary | ICD-10-CM | POA: Diagnosis not present

## 2016-10-13 DIAGNOSIS — E538 Deficiency of other specified B group vitamins: Secondary | ICD-10-CM | POA: Diagnosis not present

## 2016-10-13 DIAGNOSIS — M810 Age-related osteoporosis without current pathological fracture: Secondary | ICD-10-CM | POA: Diagnosis not present

## 2016-10-13 DIAGNOSIS — E7800 Pure hypercholesterolemia, unspecified: Secondary | ICD-10-CM | POA: Diagnosis not present

## 2016-10-13 DIAGNOSIS — M1611 Unilateral primary osteoarthritis, right hip: Principal | ICD-10-CM | POA: Diagnosis present

## 2016-10-13 DIAGNOSIS — Z471 Aftercare following joint replacement surgery: Secondary | ICD-10-CM | POA: Diagnosis not present

## 2016-10-13 DIAGNOSIS — Z96641 Presence of right artificial hip joint: Secondary | ICD-10-CM | POA: Diagnosis not present

## 2016-10-13 DIAGNOSIS — M169 Osteoarthritis of hip, unspecified: Secondary | ICD-10-CM | POA: Diagnosis present

## 2016-10-13 HISTORY — PX: TOTAL HIP ARTHROPLASTY: SHX124

## 2016-10-13 LAB — TYPE AND SCREEN
ABO/RH(D): O POS
ANTIBODY SCREEN: NEGATIVE

## 2016-10-13 SURGERY — ARTHROPLASTY, HIP, TOTAL, ANTERIOR APPROACH
Anesthesia: Spinal | Site: Hip | Laterality: Right

## 2016-10-13 MED ORDER — ACETAMINOPHEN 650 MG RE SUPP: 650.0000 mg | Freq: Four times a day (QID) | RECTAL | Status: DC | PRN

## 2016-10-13 MED ORDER — PROPOFOL 500 MG/50ML IV EMUL: INTRAVENOUS | Status: DC | PRN

## 2016-10-13 MED ORDER — ACETAMINOPHEN 10 MG/ML IV SOLN
INTRAVENOUS | Status: AC
  Filled 2016-10-13: qty 100

## 2016-10-13 MED ORDER — DEXTROSE 5 % IV SOLN
500.0000 mg | Freq: Four times a day (QID) | INTRAVENOUS | Status: DC | PRN
  Filled 2016-10-13: qty 5

## 2016-10-13 MED ORDER — LORATADINE 10 MG PO TABS
10.0000 mg | ORAL_TABLET | Freq: Every day | ORAL | Status: DC
  Administered 2016-10-14 – 2016-10-15 (×2): 10 mg via ORAL
  Filled 2016-10-13 (×2): qty 1

## 2016-10-13 MED ORDER — TRANEXAMIC ACID 1000 MG/10ML IV SOLN
1000.0000 mg | INTRAVENOUS | Status: AC
  Administered 2016-10-13: 1000 mg via INTRAVENOUS
  Filled 2016-10-13: qty 1100

## 2016-10-13 MED ORDER — FENTANYL CITRATE (PF) 100 MCG/2ML IJ SOLN
INTRAMUSCULAR | Status: AC
  Filled 2016-10-13: qty 2

## 2016-10-13 MED ORDER — BUPIVACAINE IN DEXTROSE 0.75-8.25 % IT SOLN
INTRATHECAL | Status: DC | PRN
  Administered 2016-10-13: 1.6 mL via INTRATHECAL

## 2016-10-13 MED ORDER — PROPOFOL 10 MG/ML IV BOLUS
INTRAVENOUS | Status: DC | PRN
  Administered 2016-10-13: 10 mg via INTRAVENOUS
  Administered 2016-10-13 (×2): 20 mg via INTRAVENOUS

## 2016-10-13 MED ORDER — PHENYLEPHRINE 40 MCG/ML (10ML) SYRINGE FOR IV PUSH (FOR BLOOD PRESSURE SUPPORT)
PREFILLED_SYRINGE | INTRAVENOUS | Status: AC
  Filled 2016-10-13: qty 20

## 2016-10-13 MED ORDER — METOCLOPRAMIDE HCL 5 MG/ML IJ SOLN: 5.0000 mg | Freq: Three times a day (TID) | INTRAMUSCULAR | Status: DC | PRN

## 2016-10-13 MED ORDER — CHLORHEXIDINE GLUCONATE 4 % EX LIQD: 60.0000 mL | Freq: Once | CUTANEOUS | Status: DC

## 2016-10-13 MED ORDER — FENTANYL CITRATE (PF) 100 MCG/2ML IJ SOLN
INTRAMUSCULAR | Status: DC | PRN
  Administered 2016-10-13 (×3): 25 ug via INTRAVENOUS

## 2016-10-13 MED ORDER — BUPIVACAINE HCL (PF) 0.25 % IJ SOLN
INTRAMUSCULAR | Status: DC | PRN
  Administered 2016-10-13: 30 mL

## 2016-10-13 MED ORDER — DEXAMETHASONE SODIUM PHOSPHATE 10 MG/ML IJ SOLN
10.0000 mg | Freq: Once | INTRAMUSCULAR | Status: AC
  Administered 2016-10-14: 10:00:00 10 mg via INTRAVENOUS
  Filled 2016-10-13: qty 1

## 2016-10-13 MED ORDER — ONDANSETRON HCL 4 MG/2ML IJ SOLN
INTRAMUSCULAR | Status: DC | PRN
  Administered 2016-10-13: 4 mg via INTRAVENOUS

## 2016-10-13 MED ORDER — LACTATED RINGERS IV SOLN
INTRAVENOUS | Status: DC
  Administered 2016-10-13 (×2): via INTRAVENOUS

## 2016-10-13 MED ORDER — MENTHOL 3 MG MT LOZG: 1.0000 | LOZENGE | OROMUCOSAL | Status: DC | PRN

## 2016-10-13 MED ORDER — ONDANSETRON HCL 4 MG PO TABS: 4.0000 mg | ORAL_TABLET | Freq: Four times a day (QID) | ORAL | Status: DC | PRN

## 2016-10-13 MED ORDER — PHENYLEPHRINE 40 MCG/ML (10ML) SYRINGE FOR IV PUSH (FOR BLOOD PRESSURE SUPPORT)
PREFILLED_SYRINGE | INTRAVENOUS | Status: AC
  Filled 2016-10-13: qty 10

## 2016-10-13 MED ORDER — METOCLOPRAMIDE HCL 5 MG/ML IJ SOLN: 10.0000 mg | Freq: Once | INTRAMUSCULAR | Status: DC | PRN

## 2016-10-13 MED ORDER — MEPERIDINE HCL 50 MG/ML IJ SOLN: 6.2500 mg | INTRAMUSCULAR | Status: DC | PRN

## 2016-10-13 MED ORDER — FLEET ENEMA 7-19 GM/118ML RE ENEM: 1.0000 | ENEMA | Freq: Once | RECTAL | Status: DC | PRN

## 2016-10-13 MED ORDER — POLYETHYLENE GLYCOL 3350 17 G PO PACK: 17.0000 g | PACK | Freq: Every day | ORAL | Status: DC | PRN

## 2016-10-13 MED ORDER — METOCLOPRAMIDE HCL 5 MG PO TABS: 5.0000 mg | ORAL_TABLET | Freq: Three times a day (TID) | ORAL | Status: DC | PRN

## 2016-10-13 MED ORDER — PROPOFOL 10 MG/ML IV BOLUS
INTRAVENOUS | Status: AC
  Filled 2016-10-13: qty 40

## 2016-10-13 MED ORDER — OXYCODONE HCL 5 MG PO TABS
5.0000 mg | ORAL_TABLET | ORAL | Status: DC | PRN
  Administered 2016-10-13 – 2016-10-15 (×7): 5 mg via ORAL
  Filled 2016-10-13 (×7): qty 1

## 2016-10-13 MED ORDER — ACETAMINOPHEN 10 MG/ML IV SOLN
1000.0000 mg | Freq: Once | INTRAVENOUS | Status: AC
  Administered 2016-10-13: 1000 mg via INTRAVENOUS

## 2016-10-13 MED ORDER — DIPHENHYDRAMINE HCL 12.5 MG/5ML PO ELIX: 12.5000 mg | ORAL_SOLUTION | ORAL | Status: DC | PRN

## 2016-10-13 MED ORDER — ACETAMINOPHEN 325 MG PO TABS: 650.0000 mg | ORAL_TABLET | Freq: Four times a day (QID) | ORAL | Status: DC | PRN

## 2016-10-13 MED ORDER — DEXAMETHASONE SODIUM PHOSPHATE 10 MG/ML IJ SOLN
10.0000 mg | Freq: Once | INTRAMUSCULAR | Status: AC
  Administered 2016-10-13: 10 mg via INTRAVENOUS

## 2016-10-13 MED ORDER — SODIUM CHLORIDE 0.9 % IV SOLN
1000.0000 mg | Freq: Once | INTRAVENOUS | Status: AC
  Administered 2016-10-13: 13:00:00 1000 mg via INTRAVENOUS
  Filled 2016-10-13: qty 10

## 2016-10-13 MED ORDER — FENTANYL CITRATE (PF) 100 MCG/2ML IJ SOLN: 25.0000 ug | INTRAMUSCULAR | Status: DC | PRN

## 2016-10-13 MED ORDER — SIMVASTATIN 20 MG PO TABS
40.0000 mg | ORAL_TABLET | Freq: Every day | ORAL | Status: DC
  Administered 2016-10-13 – 2016-10-14 (×2): 40 mg via ORAL
  Filled 2016-10-13 (×2): qty 2

## 2016-10-13 MED ORDER — CEFAZOLIN SODIUM-DEXTROSE 2-4 GM/100ML-% IV SOLN
INTRAVENOUS | Status: AC
  Filled 2016-10-13: qty 100

## 2016-10-13 MED ORDER — PROPOFOL 10 MG/ML IV BOLUS
INTRAVENOUS | Status: AC
  Filled 2016-10-13: qty 20

## 2016-10-13 MED ORDER — RIVAROXABAN 10 MG PO TABS
10.0000 mg | ORAL_TABLET | Freq: Every day | ORAL | Status: DC
  Administered 2016-10-14 – 2016-10-15 (×2): 10 mg via ORAL
  Filled 2016-10-13 (×2): qty 1

## 2016-10-13 MED ORDER — BISACODYL 10 MG RE SUPP: 10.0000 mg | Freq: Every day | RECTAL | Status: DC | PRN

## 2016-10-13 MED ORDER — TRAMADOL HCL 50 MG PO TABS: 50.0000 mg | ORAL_TABLET | Freq: Four times a day (QID) | ORAL | Status: DC | PRN

## 2016-10-13 MED ORDER — SODIUM CHLORIDE 0.9 % IV SOLN
INTRAVENOUS | Status: DC
Start: 2016-10-13 — End: 2016-10-15
  Administered 2016-10-13: 14:00:00 via INTRAVENOUS

## 2016-10-13 MED ORDER — DOCUSATE SODIUM 100 MG PO CAPS
100.0000 mg | ORAL_CAPSULE | Freq: Two times a day (BID) | ORAL | Status: DC
  Administered 2016-10-13 – 2016-10-15 (×4): 100 mg via ORAL
  Filled 2016-10-13 (×5): qty 1

## 2016-10-13 MED ORDER — ONDANSETRON HCL 4 MG/2ML IJ SOLN
4.0000 mg | Freq: Four times a day (QID) | INTRAMUSCULAR | Status: DC | PRN
  Administered 2016-10-13: 4 mg via INTRAVENOUS
  Filled 2016-10-13: qty 2

## 2016-10-13 MED ORDER — PHENYLEPHRINE HCL 10 MG/ML IJ SOLN
INTRAMUSCULAR | Status: DC | PRN
  Administered 2016-10-13: 40 ug via INTRAVENOUS
  Administered 2016-10-13 (×7): 80 ug via INTRAVENOUS
  Administered 2016-10-13: 120 ug via INTRAVENOUS
  Administered 2016-10-13 (×2): 80 ug via INTRAVENOUS

## 2016-10-13 MED ORDER — PROPOFOL 500 MG/50ML IV EMUL
INTRAVENOUS | Status: DC | PRN
  Administered 2016-10-13: 25 ug/kg/min via INTRAVENOUS

## 2016-10-13 MED ORDER — MORPHINE SULFATE (PF) 2 MG/ML IV SOLN: 1.0000 mg | INTRAVENOUS | Status: DC | PRN

## 2016-10-13 MED ORDER — ONDANSETRON HCL 4 MG/2ML IJ SOLN
INTRAMUSCULAR | Status: AC
  Filled 2016-10-13: qty 2

## 2016-10-13 MED ORDER — CEFAZOLIN SODIUM-DEXTROSE 2-4 GM/100ML-% IV SOLN
2.0000 g | INTRAVENOUS | Status: AC
  Administered 2016-10-13: 2 g via INTRAVENOUS

## 2016-10-13 MED ORDER — ACETAMINOPHEN 500 MG PO TABS
1000.0000 mg | ORAL_TABLET | Freq: Four times a day (QID) | ORAL | Status: AC
  Administered 2016-10-13 – 2016-10-14 (×4): 1000 mg via ORAL
  Filled 2016-10-13 (×4): qty 2

## 2016-10-13 MED ORDER — CEFAZOLIN SODIUM-DEXTROSE 2-4 GM/100ML-% IV SOLN
2.0000 g | Freq: Four times a day (QID) | INTRAVENOUS | Status: AC
  Administered 2016-10-13 (×2): 2 g via INTRAVENOUS
  Filled 2016-10-13 (×2): qty 100

## 2016-10-13 MED ORDER — METHOCARBAMOL 500 MG PO TABS
500.0000 mg | ORAL_TABLET | Freq: Four times a day (QID) | ORAL | Status: DC | PRN
  Administered 2016-10-13 – 2016-10-15 (×3): 500 mg via ORAL
  Filled 2016-10-13 (×3): qty 1

## 2016-10-13 MED ORDER — PHENOL 1.4 % MT LIQD
1.0000 | OROMUCOSAL | Status: DC | PRN
Start: 2016-10-13 — End: 2016-10-15
  Filled 2016-10-13: qty 177

## 2016-10-13 MED ORDER — BUPIVACAINE HCL (PF) 0.25 % IJ SOLN
INTRAMUSCULAR | Status: AC
  Filled 2016-10-13: qty 30

## 2016-10-13 MED ORDER — DEXAMETHASONE SODIUM PHOSPHATE 10 MG/ML IJ SOLN
INTRAMUSCULAR | Status: AC
  Filled 2016-10-13: qty 1

## 2016-10-13 SURGICAL SUPPLY — 35 items
BAG DECANTER FOR FLEXI CONT (MISCELLANEOUS) ×1 IMPLANT
BAG SPEC THK2 15X12 ZIP CLS (MISCELLANEOUS)
BAG ZIPLOCK 12X15 (MISCELLANEOUS) IMPLANT
BLADE SAG 18X100X1.27 (BLADE) ×3 IMPLANT
CAPT HIP TOTAL 2 ×2 IMPLANT
CLOSURE WOUND 1/2 X4 (GAUZE/BANDAGES/DRESSINGS) ×1
CLOTH BEACON ORANGE TIMEOUT ST (SAFETY) ×3 IMPLANT
COVER PERINEAL POST (MISCELLANEOUS) ×3 IMPLANT
DECANTER SPIKE VIAL GLASS SM (MISCELLANEOUS) ×1 IMPLANT
DRAPE STERI IOBAN 125X83 (DRAPES) ×3 IMPLANT
DRAPE U-SHAPE 47X51 STRL (DRAPES) ×6 IMPLANT
DRSG ADAPTIC 3X8 NADH LF (GAUZE/BANDAGES/DRESSINGS) ×3 IMPLANT
DRSG MEPILEX BORDER 4X4 (GAUZE/BANDAGES/DRESSINGS) ×3 IMPLANT
DRSG MEPILEX BORDER 4X8 (GAUZE/BANDAGES/DRESSINGS) ×3 IMPLANT
DURAPREP 26ML APPLICATOR (WOUND CARE) ×3 IMPLANT
ELECT REM PT RETURN 9FT ADLT (ELECTROSURGICAL) ×3
ELECTRODE REM PT RTRN 9FT ADLT (ELECTROSURGICAL) ×1 IMPLANT
EVACUATOR 1/8 PVC DRAIN (DRAIN) ×3 IMPLANT
GLOVE BIO SURGEON STRL SZ7.5 (GLOVE) ×3 IMPLANT
GLOVE BIO SURGEON STRL SZ8 (GLOVE) ×4 IMPLANT
GLOVE BIOGEL PI IND STRL 8 (GLOVE) ×2 IMPLANT
GLOVE BIOGEL PI INDICATOR 8 (GLOVE) ×4
GOWN STRL REUS W/TWL LRG LVL3 (GOWN DISPOSABLE) ×3 IMPLANT
GOWN STRL REUS W/TWL XL LVL3 (GOWN DISPOSABLE) ×3 IMPLANT
PACK ANTERIOR HIP CUSTOM (KITS) ×3 IMPLANT
STRIP CLOSURE SKIN 1/2X4 (GAUZE/BANDAGES/DRESSINGS) ×2 IMPLANT
SUT ETHIBOND NAB CT1 #1 30IN (SUTURE) ×3 IMPLANT
SUT MNCRL AB 4-0 PS2 18 (SUTURE) ×3 IMPLANT
SUT VIC AB 2-0 CT1 27 (SUTURE) ×6
SUT VIC AB 2-0 CT1 TAPERPNT 27 (SUTURE) ×2 IMPLANT
SUT VLOC 180 0 24IN GS25 (SUTURE) ×3 IMPLANT
SYR 50ML LL SCALE MARK (SYRINGE) IMPLANT
TRAY FOLEY CATH 14FRSI W/METER (CATHETERS) ×2 IMPLANT
TRAY FOLEY W/METER SILVER 16FR (SET/KITS/TRAYS/PACK) ×1 IMPLANT
YANKAUER SUCT BULB TIP 10FT TU (MISCELLANEOUS) ×3 IMPLANT

## 2016-10-13 NOTE — Op Note (Signed)
OPERATIVE REPORT- TOTAL HIP ARTHROPLASTY   PREOPERATIVE DIAGNOSIS: Osteoarthritis of the Right hip.   POSTOPERATIVE DIAGNOSIS: Osteoarthritis of the Right  hip.   PROCEDURE: Right total hip arthroplasty, anterior approach.   SURGEON: Gaynelle Arabian, MD   ASSISTANT: Arlee Muslim, PA-C  ANESTHESIA:  Spinal  ESTIMATED BLOOD LOSS:-350 ml   DRAINS: Hemovac x1.   COMPLICATIONS: None   CONDITION: PACU - hemodynamically stable.   BRIEF CLINICAL NOTE: Kathryn [name] is a 83 y.o. female who has advanced end-  stage arthritis of their Right  hip with progressively worsening pain and  dysfunction.The patient has failed nonoperative management and presents for  total hip arthroplasty.   PROCEDURE IN DETAIL: After successful administration of spinal  anesthetic, the traction boots for the [facility] bed were placed on both  feet and the patient was placed onto the [facility] bed, boots placed into the leg  holders. The Right hip was then isolated from the perineum with plastic  drapes and prepped and draped in the usual sterile fashion. ASIS and  greater trochanter were marked and a oblique incision was made, starting  at about 1 cm lateral and 2 cm distal to the ASIS and coursing towards  the anterior cortex of the femur. The skin was cut with a 10 blade  through subcutaneous tissue to the level of the fascia overlying the  tensor fascia lata muscle. The fascia was then incised in line with the  incision at the junction of the anterior third and posterior 2/3rd. The  muscle was teased off the fascia and then the interval between the TFL  and the rectus was developed. The Hohmann retractor was then placed at  the top of the femoral neck over the capsule. The vessels overlying the  capsule were cauterized and the fat on top of the capsule was removed.  A Hohmann retractor was then placed anterior underneath the rectus  femoris to give exposure to the entire anterior capsule. A T-shaped   capsulotomy was performed. The edges were tagged and the femoral head  was identified.       Osteophytes are removed off the superior acetabulum.  The femoral neck was then cut in situ with an oscillating saw. Traction  was then applied to the left lower extremity utilizing the [facility]  traction. The femoral head was then removed. Retractors were placed  around the acetabulum and then circumferential removal of the labrum was  performed. Osteophytes were also removed. Reaming starts at 47 mm to  medialize and  Increased in 2 mm increments to 51 mm. We reamed in  approximately 40 degrees of abduction, 20 degrees anteversion. A 52 mm  pinnacle acetabular shell was then impacted in anatomic position under  fluoroscopic guidance with excellent purchase. We did not need to place  any additional dome screws. A 32 mm neutral + 4 marathon liner was then  placed into the acetabular shell.       The femoral lift was then placed along the lateral aspect of the femur  just distal to the vastus ridge. The leg was  externally rotated and capsule  was stripped off the inferior aspect of the femoral neck down to the  level of the lesser trochanter, this was done with electrocautery. The femur was lifted after this was performed. The  leg was then placed in an extended and adducted position essentially delivering the femur. We also removed the capsule superiorly and the piriformis from the piriformis fossa  to gain excellent exposure of the  proximal femur. Rongeur was used to remove some cancellous bone to get  into the lateral portion of the proximal femur for placement of the  initial starter reamer. The starter broaches was placed  the starter broach  and was shown to go down the center of the canal. Broaching  with the  Corail system was then performed starting at size 8, coursing  Up to size 14. A size 14 had excellent torsional and rotational  and axial stability. The trial standard offset neck was then  placed  with a 32 + 1 trial head. The hip was then reduced. We confirmed that  the stem was in the canal both on AP and lateral x-rays. It also has excellent sizing. The hip was reduced with outstanding stability through full extension and full external rotation.. AP pelvis was taken and the leg lengths were measured and found to be equal. Hip was then dislocated again and the femoral head and neck removed. The  femoral broach was removed. Size 14 Corail stem with a standard offset  neck was then impacted into the femur following native anteversion. Has  excellent purchase in the canal. Excellent torsional and rotational and  axial stability. It is confirmed to be in the canal on AP and lateral  fluoroscopic views. The 32 + 1 ceramic head was placed and the hip  reduced with outstanding stability. Again AP pelvis was taken and it  confirmed that the leg lengths were equal. The wound was then copiously  irrigated with saline solution and the capsule reattached and repaired  with Ethibond suture. 30 ml of .25% Bupivicaine was  injected into the capsule and into the edge of the tensor fascia lata as well as subcutaneous tissue. The fascia overlying the tensor fascia lata was then closed with a running #1 V-Loc. Subcu was closed with interrupted 2-0 Vicryl and subcuticular running 4-0 Monocryl. Incision was cleaned  and dried. Steri-Strips and a bulky sterile dressing applied. Hemovac  drain was hooked to suction and then the patient was awakened and transported to  recovery in stable condition.        Please note that a surgical assistant was a medical necessity for this procedure to perform it in a safe and expeditious manner. Assistant was necessary to provide appropriate retraction of vital neurovascular structures and to prevent femoral fracture and allow for anatomic placement of the prosthesis.  Gaynelle Arabian, M.D.

## 2016-10-13 NOTE — Progress Notes (Signed)
PT HAS NOTED SCAB TO RIGHT ARM.Marland Kitchen NO DRAINAGE NOTED.

## 2016-10-13 NOTE — Anesthesia Procedure Notes (Addendum)
Spinal  Patient location during procedure: OR Start time: 10/13/2016 8:18 AM End time: 10/13/2016 8:24 AM Reason for block: at surgeon's request Staffing Anesthesiologist: Josephine Igo Resident/CRNA: Christell Faith L Performed: resident/CRNA  Preanesthetic Checklist Completed: patient identified, site marked, surgical consent, pre-op evaluation, timeout performed, IV checked, risks and benefits discussed and monitors and equipment checked Spinal Block Patient position: sitting Prep: Betadine Patient monitoring: heart rate, continuous pulse ox and blood pressure Approach: midline Injection technique: single-shot Needle Needle type: Pencan  Needle gauge: 24 G Needle length: 9 cm Assessment Sensory level: T4 Additional Notes Expiration of kit checked and confirmed. Patient tolerated procedure well,without complications with noted clear CSF. Patient placed with right side down times 3 minutes post injection. Loss of motor and sensory on exam post injection.

## 2016-10-13 NOTE — Anesthesia Postprocedure Evaluation (Signed)
Anesthesia Post Note  Patient: Kathryn [name]  Procedure(s) Performed: Procedure(s) (LRB): RIGHT TOTAL HIP ARTHROPLASTY ANTERIOR APPROACH (Right)  Patient location during evaluation: PACU Anesthesia Type: Spinal Level of consciousness: oriented and awake and alert Pain management: pain level controlled Vital Signs Assessment: post-procedure vital signs reviewed and stable Respiratory status: spontaneous breathing, respiratory function stable and nonlabored ventilation Cardiovascular status: blood pressure returned to baseline and stable Postop Assessment: no headache, no backache, patient able to bend at knees, spinal receding and no signs of nausea or vomiting Anesthetic complications: no       Last Vitals:  Vitals:   10/13/16 1030 10/13/16 1045  BP:    Pulse:    Resp:    Temp: (!) 35.6 C 36.3 C    Last Pain:  Vitals:   10/13/16 1045  TempSrc:   PainSc: 0-No pain                 Doye Montilla A.

## 2016-10-13 NOTE — Interval H&P Note (Signed)
History and Physical Interval Note:  10/13/2016 7:45 AM  Kathryn [name]  has presented today for surgery, with the diagnosis of Right Hip   OA  The various methods of treatment have been discussed with the patient and family. After consideration of risks, benefits and other options for treatment, the patient has consented to  Procedure(s): RIGHT TOTAL HIP ARTHROPLASTY ANTERIOR APPROACH (Right) as a surgical intervention .  The patient's history has been reviewed, patient examined, no change in status, stable for surgery.  I have reviewed the patient's chart and labs.  Questions were answered to the patient's satisfaction.     Gearlean Alf

## 2016-10-13 NOTE — Evaluation (Signed)
Physical Therapy Evaluation Patient Details Name: Kathryn [name] MRN: [id number] DOB: Dec 31, 1930 Today's Date: 10/13/2016   History of Present Illness  R DATHA  Clinical Impression  The patient  Is mobilizing very well with minimal complaints. Pt admitted with above diagnosis. Pt currently with functional limitations due to the deficits listed below (see PT Problem List).  Pt will benefit from skilled PT to increase their independence and safety with mobility to allow discharge to the venue listed below.       Follow Up Recommendations No PT follow up    Equipment Recommendations  None recommended by PT    Recommendations for Other Services       Precautions / Restrictions Precautions Precautions: Fall      Mobility  Bed Mobility Overal bed mobility: Needs Assistance Bed Mobility: Supine to Sit     Supine to sit: Min assist     General bed mobility comments: right leg  Transfers Overall transfer level: Needs assistance Equipment used: Rolling walker (2 wheeled) Transfers: Sit to/from Stand Sit to Stand: Min assist         General transfer comment: cues  Ambulation/Gait Ambulation/Gait assistance: Min assist Ambulation Distance (Feet): 15 Feet   Gait Pattern/deviations: Step-to pattern;Step-through pattern     General Gait Details: cues for sequence  Stairs            Wheelchair Mobility    Modified Rankin (Stroke Patients Only)       Balance                                             Pertinent Vitals/Pain Pain Assessment: 0-10 Pain Score: 2  Pain Location: right  thigh Pain Descriptors / Indicators: Sore Pain Intervention(s): Monitored during session;Premedicated before session;Ice applied    Home Living Family/patient expects to be discharged to:: Private residence Living Arrangements: Spouse/significant other;Children Available Help at Discharge: Family Type of Home: House Home Access: Stairs to enter    Technical brewer of Steps: 1 Home Layout: One level Home Equipment: Environmental consultant - 2 wheels;Bedside commode      Prior Function                 Hand Dominance        Extremity/Trunk Assessment   Upper Extremity Assessment Upper Extremity Assessment: Overall WFL for tasks assessed    Lower Extremity Assessment Lower Extremity Assessment: RLE deficits/detail RLE Deficits / Details: able to move the leg to the bed edge    Cervical / Trunk Assessment Cervical / Trunk Assessment: Normal  Communication      Cognition Arousal/Alertness: Awake/alert Behavior During Therapy: WFL for tasks assessed/performed Overall Cognitive Status: Within Functional Limits for tasks assessed                      General Comments      Exercises     Assessment/Plan    PT Assessment Patient needs continued PT services  PT Problem List Decreased strength;Decreased activity tolerance;Decreased balance;Decreased mobility;Decreased knowledge of precautions;Decreased safety awareness;Decreased knowledge of use of DME;Pain          PT Treatment Interventions DME instruction;Gait training;Stair training;Functional mobility training;Therapeutic activities;Patient/family education    PT Goals (Current goals can be found in the Care Plan section)  Acute Rehab PT Goals Patient Stated Goal: to walk PT Goal Formulation: With patient Time For  Goal Achievement: 10/16/16 Potential to Achieve Goals: Good    Frequency 7X/week   Barriers to discharge        Co-evaluation               End of Session   Activity Tolerance: Patient tolerated treatment well Patient left: in chair;with call bell/phone within reach;with chair alarm set Nurse Communication: Mobility status         Time: [id number] PT Time Calculation (min) (ACUTE ONLY): 23 min   Charges:   PT Evaluation $PT Eval Low Complexity: 1 Procedure PT Treatments $Gait Training: 8-22 mins   PT G Codes:         Claretha Cooper 10/13/2016, 4:18 PM Tresa Endo PT [phone]

## 2016-10-13 NOTE — Anesthesia Preprocedure Evaluation (Addendum)
Anesthesia Evaluation  Patient identified by MRN, date of birth, ID band Patient awake    Reviewed: Allergy & Precautions, NPO status , Patient's Chart, lab work & pertinent test results  History of Anesthesia Complications (+) PONV and history of anesthetic complications  Airway Mallampati: I  TM Distance: >3 FB Neck ROM: Full    Dental no notable dental hx. (+) Teeth Intact, Caps   Pulmonary neg pulmonary ROS,    Pulmonary exam normal breath sounds clear to auscultation       Cardiovascular negative cardio ROS Normal cardiovascular exam+ dysrhythmias  Rhythm:Regular Rate:Normal     Neuro/Psych negative neurological ROS  negative psych ROS   GI/Hepatic negative GI ROS, Neg liver ROS,   Endo/Other  negative endocrine ROS  Renal/GU negative Renal ROS  negative genitourinary   Musculoskeletal  (+) Arthritis , Osteoarthritis,  OA right hip   Abdominal   Peds  Hematology  (+) anemia ,   Anesthesia Other Findings   Reproductive/Obstetrics                              Chemistry      Component Value Date/Time   NA 140 10/04/2016 1345   K 4.4 10/04/2016 1345   CL 107 10/04/2016 1345   CO2 27 10/04/2016 1345   BUN 22 (H) 10/04/2016 1345   CREATININE 0.50 10/04/2016 1345   CREATININE 0.66 09/28/2016 1007      Component Value Date/Time   CALCIUM 9.0 10/04/2016 1345   ALKPHOS 73 10/04/2016 1345   AST 28 10/04/2016 1345   ALT 20 10/04/2016 1345   BILITOT 0.7 10/04/2016 1345     Lab Results  Component Value Date   WBC 6.8 10/04/2016   HGB 10.9 (L) 10/04/2016   HCT 33.3 (L) 10/04/2016   MCV 94.6 10/04/2016   PLT 288 10/04/2016   EKG: 1st degree AV block, normal sinus rhythm.  Anesthesia Physical Anesthesia Plan  ASA: II  Anesthesia Plan: Spinal   Post-op Pain Management:    Induction: Intravenous  Airway Management Planned: Oral ETT  Additional Equipment:   Intra-op  Plan:   Post-operative Plan: Extubation in OR  Informed Consent: I have reviewed the patients History and Physical, chart, labs and discussed the procedure including the risks, benefits and alternatives for the proposed anesthesia with the patient or authorized representative who has indicated his/her understanding and acceptance.   Dental advisory given  Plan Discussed with:   Anesthesia Plan Comments:         Anesthesia Quick Evaluation

## 2016-10-13 NOTE — Progress Notes (Signed)
Per Director: noted Hemovac was out. Removed inadvertently by patient. Out put very minimum approximately 50 ml.  Applied ice to area, small pinpoint area noted on bandage. Will continue to monitor per Doctors orders and unit protocol.

## 2016-10-13 NOTE — H&P (View-Only) (Signed)
Kathryn [name] DOB: 04-17-1931 Married / Language: English / Race: White Female Date of Admission:  10/13/2016 CC:  Right Hip Pain History of Present Illness The patient is a 83 year old female who comes in for a preoperative History and Physical. The patient is scheduled for a right total hip arthroplasty (anterior) to be performed by Dr. Dione Plover. Aluisio, MD at [facility] on 10-13-2016. The patient is a 83 year old female presented 10 years out from left total hip arthroplasty. The patient states that she is doing very well at this time with regards to the left hip. The pain is under excellent control at this time and describes their pain as no pain. However, the patient reports right hip problems including pain (aching) and pt. is having trouble bending at the right hip symptoms that have been present for several month(s). The symptoms began without any known injury. Symptoms reported include hip pain The patient reports symptoms radiating to the: right groin and right thigh. The patient describes the hip problem as aching.The patient feels as if their symptoms are notes that they are unchanged. Current treatment includes application of heat and nonsteroidal anti-inflammatory drugs (Aleve).Note for "Hip problem": pain mostly at night She has had pain from her right hip down through her right knee. It has been going on for few months now. Getting progressively worse. It is very similar to the hip pain she had before we replaced her left hip years ago. She has pain at night as well as with activity. It is limiting her quite a bit.  AP pelvis and lateral of the right hip show bone-on-bone arthritis through the right hip. She also had x-rays, AP and lateral of the right knee. Her left hip prosthesis is in excellent position with no periprosthetic abnormalities. At this point, the most predictable means of improving pain and function is a right total hip arthroplasty. They have been treated  conservatively in the past for the above stated problem and despite conservative measures, they continue to have progressive pain and severe functional limitations and dysfunction. They have failed non-operative management including home exercise, medications, and injections. It is felt that they would benefit from undergoing total joint replacement. Risks and benefits of the procedure have been discussed with the patient and they elect to proceed with surgery. There are no active contraindications to surgery such as ongoing infection or rapidly progressive neurological disease.  Problem List/Past Medical S/P total knee arthroplasty (V43.65)  S/P total hip arthroplasty [id number])  Pain of right hip joint (M25.551)  Hypercholesterolemia  Rheumatoid Arthritis  Measles  Mumps   Allergies  No Known Drug Allergies   Family History Cancer  sister Congestive Heart Failure  brother Heart Disease  brother Hypertension  brother  Social History  Alcohol use  Occasional alcohol use, Drinks wine. 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 never smoker  Medication History Zocor (Oral) Specific strength unknown - Active. Calcium Complex (Oral) Active. Zyrtec Active.  Voltaren (75MG  Tablet DR, Oral) Active.   Past Surgical History  Total Hip Replacement  left Total Knee Replacement  right  Review of Systems  General Not Present- Chills, Fatigue,  Fever, Memory Loss, Night Sweats, Weight Gain and Weight Loss. Skin Not Present- Eczema, Hives, Itching, Lesions and Rash. HEENT Not Present- Dentures, Double Vision, Headache, Hearing Loss, Tinnitus and Visual Loss. Respiratory Not Present- Allergies, Chronic Cough, Coughing up blood, Shortness of breath at rest and Shortness of breath with exertion. Cardiovascular Not Present- Chest Pain, Difficulty Breathing Lying Down, Murmur, Palpitations, Racing/skipping heartbeats and Swelling. Gastrointestinal Not Present- Abdominal Pain, Bloody Stool, Constipation, Diarrhea, Difficulty Swallowing, Heartburn, Jaundice, Loss of appetitie, Nausea and Vomiting. Female Genitourinary Not Present- Blood in Urine, Discharge, Flank Pain, Incontinence, Painful Urination, Urgency, Urinary frequency, Urinary Retention, Urinating at Night and Weak urinary stream. Musculoskeletal Present- Joint Pain. Not Present- Back Pain, Joint Swelling, Morning Stiffness, Muscle Pain, Muscle Weakness and Spasms. Neurological Not Present- Blackout spells, Difficulty with balance, Dizziness, Paralysis, Tremor and Weakness. Psychiatric Not Present- Insomnia.  Vitals Weight: 135 lb Height: 62.5in Weight was reported by patient. Height was reported by patient. Body Surface Area: 1.63 m Body Mass Index: 24.3 kg/m  Pulse: 68 (Regular)  BP: 142/76 (Sitting, Right Arm, Standard)   Physical Exam  General Mental Status -Alert, cooperative and good historian. General Appearance-pleasant, Not in acute distress. Orientation-Oriented X3. Build & Nutrition-Well nourished and Well developed.  Head and Neck Head-normocephalic, atraumatic . Neck Global Assessment - bruit auscultated on the right, supple, no bruit auscultated on the left.  Eye Vision-Wears contact lenses. Pupil - Bilateral-Regular and Round. Motion - Bilateral-EOMI.  Chest and Lung Exam Auscultation Breath sounds - clear at anterior chest  wall and clear at posterior chest wall. Adventitious sounds - No Adventitious sounds.  Cardiovascular Auscultation Rhythm - Regular rate and rhythm. Heart Sounds - S1 WNL and S2 WNL. Murmurs & Other Heart Sounds - Auscultation of the heart reveals - No Murmurs.  Abdomen Palpation/Percussion Tenderness - Abdomen is non-tender to palpation. Rigidity (guarding) - Abdomen is soft. Auscultation Auscultation of the abdomen reveals - Bowel sounds normal.  Female Genitourinary Note: Not done, not pertinent to present illness   Musculoskeletal Note: She is in no distress. Right hip can be flexed to 100. Minimal internal rotation, about 10 to 20 of external rotation, 20 of abduction, all with pain. Her right knee shows no swelling. Range of about 3 to 125 with no tenderness or instability.  RADIOGRAPHS AP pelvis and lateral of the right hip show bone-on-bone arthritis through the right hip. She also had x-rays, AP and lateral of the right knee. Her prosthesis is in excellent position with no periprosthetic abnormalities.    Assessment & Plan Primary osteoarthritis of right hip (M16.11)  Note:Surgical Plans: Right Total Hip Replacement - Anterior Approach  Disposition: Home  PCP: Dr. Diona Browner Cards: Dr. Oval Linsey  IV TXA  Anesthesia Issues: None  At the time of her H&P and prior to her surgery, a carotid doppler was ordered and pending.  Signed electronically by Ok Edwards, III PA-C

## 2016-10-13 NOTE — Transfer of Care (Signed)
Immediate Anesthesia Transfer of Care Note  Patient: Kathryn [name]  Procedure(s) Performed: Procedure(s): RIGHT TOTAL HIP ARTHROPLASTY ANTERIOR APPROACH (Right)  Patient Location: PACU  Anesthesia Type:MAC and Spinal  Level of Consciousness:  sedated, patient cooperative and responds to stimulation  Airway & Oxygen Therapy:Patient Spontanous Breathing and Patient connected to face mask oxgen  Post-op Assessment:  Report given to PACU RN and Post -op Vital signs reviewed and stable  Post vital signs:  Reviewed and stable  Last Vitals:  Vitals:   10/13/16 0632 10/13/16 1015  BP: (!) 146/74 124/64  Pulse: 70 66  Resp: 16 15  Temp: 40.0 C     Complications: No apparent anesthesia complications

## 2016-10-14 LAB — BASIC METABOLIC PANEL
ANION GAP: 5 (ref 5–15)
BUN: 16 mg/dL (ref 6–20)
CHLORIDE: 111 mmol/L (ref 101–111)
CO2: 25 mmol/L (ref 22–32)
Calcium: 8.6 mg/dL — ABNORMAL LOW (ref 8.9–10.3)
Creatinine, Ser: 0.6 mg/dL (ref 0.44–1.00)
GFR calc non Af Amer: >60 mL/min (ref >60)
Glucose, Bld: 118 mg/dL — ABNORMAL HIGH (ref 65–99)
POTASSIUM: 4.2 mmol/L (ref 3.5–5.1)
Sodium: 141 mmol/L (ref 135–145)

## 2016-10-14 LAB — CBC
HEMATOCRIT: 26.4 % — AB (ref 36.0–46.0)
HEMOGLOBIN: 8.7 g/dL — AB (ref 12.0–15.0)
MCH: 31 pg (ref 26.0–34.0)
MCHC: 33 g/dL (ref 30.0–36.0)
MCV: 94 fL (ref 78.0–100.0)
Platelets: 223 10*3/uL (ref 150–400)
RBC: 2.81 MIL/uL — ABNORMAL LOW (ref 3.87–5.11)
RDW: 14.4 % (ref 11.5–15.5)
WBC: 9.5 10*3/uL (ref 4.0–10.5)

## 2016-10-14 NOTE — Discharge Instructions (Addendum)
° °Dr. Frank Aluisio °Total Joint Specialist °[facility] °[address] °, [place] [address] °[phone] ° °ANTERIOR APPROACH TOTAL HIP REPLACEMENT POSTOPERATIVE DIRECTIONS ° ° °Hip Rehabilitation, Guidelines Following Surgery  °The results of a hip operation are greatly improved after range of motion and muscle strengthening exercises. Follow all safety measures which are given to protect your hip. If any of these exercises cause increased pain or swelling in your joint, decrease the amount until you are comfortable again. Then slowly increase the exercises. Call your caregiver if you have problems or questions.  ° °HOME CARE INSTRUCTIONS  °Remove items at home which could result in a fall. This includes throw rugs or furniture in walking pathways.  °· ICE to the affected hip every three hours for 30 minutes at a time and then as needed for pain and swelling.  Continue to use ice on the hip for pain and swelling from surgery. You may notice swelling that will progress down to the foot and ankle.  This is normal after surgery.  Elevate the leg when you are not up walking on it.   °· Continue to use the breathing machine which will help keep your temperature down.  It is common for your temperature to cycle up and down following surgery, especially at night when you are not up moving around and exerting yourself.  The breathing machine keeps your lungs expanded and your temperature down. ° ° °DIET °You may resume your previous home diet once your are discharged from the hospital. ° °DRESSING / WOUND CARE / SHOWERING °You may shower 3 days after surgery, but keep the wounds dry during showering.  You may use an occlusive plastic wrap (Press'n Seal for example), NO SOAKING/SUBMERGING IN THE BATHTUB.  If the bandage gets wet, change with a clean dry gauze.  If the incision gets wet, pat the wound dry with a clean towel. °You may start showering once you are discharged home but do not  submerge the incision under water. Just pat the incision dry and apply a dry gauze dressing on daily. °Change the surgical dressing daily and reapply a dry dressing each time. ° °ACTIVITY °Walk with your walker as instructed. °Use walker as long as suggested by your caregivers. °Avoid periods of inactivity such as sitting longer than an hour when not asleep. This helps prevent blood clots.  °You may resume a sexual relationship in one month or when given the OK by your doctor.  °You may return to work once you are cleared by your doctor.  °Do not drive a car for 6 weeks or until released by you surgeon.  °Do not drive while taking narcotics. ° °WEIGHT BEARING °Weight bearing as tolerated with assist device (walker, cane, etc) as directed, use it as long as suggested by your surgeon or therapist, typically at least 4-6 weeks. ° °POSTOPERATIVE CONSTIPATION PROTOCOL °Constipation - defined medically as fewer than three stools per week and severe constipation as less than one stool per week. ° °One of the most common issues patients have following surgery is constipation.  Even if you have a regular bowel pattern at home, your normal regimen is likely to be disrupted due to multiple reasons following surgery.  Combination of anesthesia, postoperative narcotics, change in appetite and fluid intake all can affect your bowels.  In order to avoid complications following surgery, here are some recommendations in order to help you during your recovery period. ° °Colace (docusate) - Pick up an over-the-counter   form of Colace or another stool softener and take twice a day as long as you are requiring postoperative pain medications.  Take with a full glass of water daily.  If you experience loose stools or diarrhea, hold the colace until you stool forms back up.  If your symptoms do not get better within 1 week or if they get worse, check with your doctor. ° °Dulcolax (bisacodyl) - Pick up over-the-counter and take as directed  by the product packaging as needed to assist with the movement of your bowels.  Take with a full glass of water.  Use this product as needed if not relieved by Colace only.  ° °MiraLax (polyethylene glycol) - Pick up over-the-counter to have on hand.  MiraLax is a solution that will increase the amount of water in your bowels to assist with bowel movements.  Take as directed and can mix with a glass of water, juice, soda, coffee, or tea.  Take if you go more than two days without a movement. °Do not use MiraLax more than once per day. Call your doctor if you are still constipated or irregular after using this medication for 7 days in a row. ° °If you continue to have problems with postoperative constipation, please contact the office for further assistance and recommendations.  If you experience "the worst abdominal pain ever" or develop nausea or vomiting, please contact the office immediatly for further recommendations for treatment. ° °ITCHING ° If you experience itching with your medications, try taking only a single pain pill, or even half a pain pill at a time.  You can also use Benadryl over the counter for itching or also to help with sleep.  ° °TED HOSE STOCKINGS °Wear the elastic stockings on both legs for three weeks following surgery during the day but you may remove then at night for sleeping. ° °MEDICATIONS °See your medication summary on the “After Visit Summary” that the nursing staff will review with you prior to discharge.  You may have some home medications which will be placed on hold until you complete the course of blood thinner medication.  It is important for you to complete the blood thinner medication as prescribed by your surgeon.  Continue your approved medications as instructed at time of discharge. ° °PRECAUTIONS °If you experience chest pain or shortness of breath - call 911 immediately for transfer to the hospital emergency department.  °If you develop a fever greater that 101 F,  purulent drainage from wound, increased redness or drainage from wound, foul odor from the wound/dressing, or calf pain - CONTACT YOUR SURGEON.   °                                                °FOLLOW-UP APPOINTMENTS °Make sure you keep all of your appointments after your operation with your surgeon and caregivers. You should call the office at the above phone number and make an appointment for approximately two weeks after the date of your surgery or on the date instructed by your surgeon outlined in the "After Visit Summary". ° °RANGE OF MOTION AND STRENGTHENING EXERCISES  °These exercises are designed to help you keep full movement of your hip joint. Follow your caregiver's or physical therapist's instructions. Perform all exercises about fifteen times, three times per day or as directed. Exercise both hips, even if you   have had only one joint replacement. These exercises can be done on a training (exercise) mat, on the floor, on a table or on a bed. Use whatever works the best and is most comfortable for you. Use music or television while you are exercising so that the exercises are a pleasant break in your day. This will make your life better with the exercises acting as a break in routine you can look forward to.  °Lying on your back, slowly slide your foot toward your buttocks, raising your knee up off the floor. Then slowly slide your foot back down until your leg is straight again.  °Lying on your back spread your legs as far apart as you can without causing discomfort.  °Lying on your side, raise your upper leg and foot straight up from the floor as far as is comfortable. Slowly lower the leg and repeat.  °Lying on your back, tighten up the muscle in the front of your thigh (quadriceps muscles). You can do this by keeping your leg straight and trying to raise your heel off the floor. This helps strengthen the largest muscle supporting your knee.  °Lying on your back, tighten up the muscles of your  buttocks both with the legs straight and with the knee bent at a comfortable angle while keeping your heel on the floor.  ° °IF YOU ARE TRANSFERRED TO A SKILLED REHAB FACILITY °If the patient is transferred to a skilled rehab facility following release from the hospital, a list of the current medications will be sent to the facility for the patient to continue.  When discharged from the skilled rehab facility, please have the facility set up the patient's Home Health Physical Therapy prior to being released. Also, the skilled facility will be responsible for providing the patient with their medications at time of release from the facility to include their pain medication, the muscle relaxants, and their blood thinner medication. If the patient is still at the rehab facility at time of the two week follow up appointment, the skilled rehab facility will also need to assist the patient in arranging follow up appointment in our office and any transportation needs. ° °MAKE SURE YOU:  °Understand these instructions.  °Get help right away if you are not doing well or get worse.  ° ° °Pick up stool softner and laxative for home use following surgery while on pain medications. °Do not submerge incision under water. °Please use good hand washing techniques while changing dressing each day. °May shower starting three days after surgery. °Please use a clean towel to pat the incision dry following showers. °Continue to use ice for pain and swelling after surgery. °Do not use any lotions or creams on the incision until instructed by your surgeon. ° °Take Xarelto for two and a half more weeks following discharge from the hospital, then discontinue Xarelto. °Once the patient has completed the blood thinner regimen, then take a Baby 81 mg Aspirin daily for three more weeks. ° °Information on my medicine - XARELTO® (Rivaroxaban) ° °This medication education was reviewed with me or my healthcare representative as part of my discharge  preparation.  The pharmacist that spoke with me during my hospital stay was:  Hussein Askar, PharmD Candidate ° °Why was Xarelto® prescribed for you? °Xarelto® was prescribed for you to reduce the risk of blood clots forming after orthopedic surgery. The medical term for these abnormal blood clots is venous thromboembolism (VTE). ° °What do you need to know about xarelto® ? °  Take your Xarelto® ONCE DAILY at the same time every day. °You may take it either with or without food. ° °If you have difficulty swallowing the tablet whole, you may crush it and mix in applesauce just prior to taking your dose. ° °Take Xarelto® exactly as prescribed by your doctor and DO NOT stop taking Xarelto® without talking to the doctor who prescribed the medication.  Stopping without other VTE prevention medication to take the place of Xarelto® may increase your risk of developing a clot. ° °After discharge, you should have regular check-up appointments with your healthcare provider that is prescribing your Xarelto®.   ° °What do you do if you miss a dose? °If you miss a dose, take it as soon as you remember on the same day then continue your regularly scheduled once daily regimen the next day. Do not take two doses of Xarelto® on the same day.  ° °Important Safety Information °A possible side effect of Xarelto® is bleeding. You should call your healthcare provider right away if you experience any of the following: °? Bleeding from an injury or your nose that does not stop. °? Unusual colored urine (red or dark brown) or unusual colored stools (red or black). °? Unusual bruising for unknown reasons. °? A serious fall or if you hit your head (even if there is no bleeding). ° °Some medicines may interact with Xarelto® and might increase your risk of bleeding while on Xarelto®. To help avoid this, consult your healthcare provider or pharmacist prior to using any new prescription or non-prescription medications, including herbals, vitamins,  non-steroidal anti-inflammatory drugs (NSAIDs) and supplements. ° °This website has more information on Xarelto®: www.xarelto.com. ° ° ° ° °

## 2016-10-14 NOTE — Progress Notes (Signed)
PT Cancellation Note  Patient Details Name: Kathryn [name] MRN: [id number] DOB: [birth year]   Cancelled Treatment:    Reason Eval/Treat Not Completed: Fatigue/lethargy limiting ability to participate (wants to rest. Has been up to BR. check back in AM.)   Claretha Cooper 10/14/2016, 4:06 PM

## 2016-10-14 NOTE — Progress Notes (Signed)
Physical Therapy Treatment Patient Details Name: Kathryn [name] MRN: [id number] DOB: [birth year] Today's Date: 10/14/2016    History of Present Illness R DATHA    PT Comments    The patient is progressing well. Plans DC tomorrow.  Follow Up Recommendations  No PT follow up     Equipment Recommendations  None recommended by PT    Recommendations for Other Services       Precautions / Restrictions Precautions Precautions: Fall Restrictions Weight Bearing Restrictions: No    Mobility  Bed Mobility   Bed Mobility: Supine to Sit     Supine to sit: Supervision     General bed mobility comments: pt OOB  Transfers Overall transfer level: Needs assistance Equipment used: Rolling walker (2 wheeled) Transfers: Sit to/from Stand Sit to Stand: Supervision Stand pivot transfers: Supervision       General transfer comment: cues  Ambulation/Gait Ambulation/Gait assistance: Supervision Ambulation Distance (Feet): 150 Feet Assistive device: Rolling walker (2 wheeled) Gait Pattern/deviations: Step-to pattern;Step-through pattern     General Gait Details: cues for sequence   Stairs            Wheelchair Mobility    Modified Rankin (Stroke Patients Only)       Balance                                    Cognition Arousal/Alertness: Awake/alert Behavior During Therapy: WFL for tasks assessed/performed Overall Cognitive Status: Within Functional Limits for tasks assessed                      Exercises Total Joint Exercises Ankle Circles/Pumps: AROM;Both;10 reps Quad Sets: AROM;Both;10 reps Short Arc Quad: AROM;Right;10 reps Heel Slides: AROM;Right;10 reps Hip ABduction/ADduction: AROM;Right;10 reps    General Comments        Pertinent Vitals/Pain Pain Score: 2  Pain Location: right  thigh Pain Descriptors / Indicators: Sore Pain Intervention(s): Monitored during session;Premedicated before session;Repositioned;Ice applied     Home Living Family/patient expects to be discharged to:: Private residence Living Arrangements: Spouse/significant other;Children Available Help at Discharge: Family Type of Home: House Home Access: Stairs to enter   Home Layout: One level Home Equipment: Environmental consultant - 2 wheels;Bedside commode      Prior Function Level of Independence: Independent          PT Goals (current goals can now be found in the care plan section) Progress towards PT goals: Progressing toward goals    Frequency    7X/week      PT Plan Current plan remains appropriate    Co-evaluation             End of Session   Activity Tolerance: Patient tolerated treatment well       Time: [id number] PT Time Calculation (min) (ACUTE ONLY): 23 min  Charges:  $Gait Training: 8-22 mins $Therapeutic Exercise: 8-22 mins                    G Codes:      Claretha Cooper 10/14/2016, 1:47 PM Tresa Endo PT [phone]

## 2016-10-14 NOTE — Care Management Note (Signed)
Case Management Note  Patient Details  Name: Kathryn [name] MRN: [id number] Date of Birth: June 16, 1931  Subjective/Objective:                  RIGHT TOTAL HIP ARTHROPLASTY ANTERIOR APPROACH (Right) Action/Plan: Discharge planning Expected Discharge Date:  10/15/16               Expected Discharge Plan:  [place]  In-House Referral:     Discharge planning Services  CM Consult  Post Acute Care Choice:  Home Health Choice offered to:  Patient  DME Arranged:  N/A DME Agency:  NA  HH Arranged:  PT [place] Agency:  NA  Status of Service:  Completed, signed off  If discussed at [place] of Stay Meetings, dates discussed:    Additional Comments: CM met with pt in room to offer choice of home health agency. Pt chooses [facility] to render HHPT. Referral given to [facility] rep, Tim. Pt states she has all the DMe needed at home. No other CM needs were communicated. Dellie Catholic, RN 10/14/2016, 11:35 AM

## 2016-10-14 NOTE — Evaluation (Signed)
   Occupational Therapy Evaluation Patient Details Name: AIMME STARZYNSKI MRN: [id number] DOB: 08-Dec-1930 Today's Date: 11/05/16    History of Present Illness R DATHA   Clinical Impression   OT education complete- pt has all needed DME and AE    Follow Up Recommendations  No OT follow up    Equipment Recommendations  None recommended by OT    Recommendations for Other Services       Precautions / Restrictions Precautions Precautions: Fall Restrictions Weight Bearing Restrictions: No      Mobility Bed Mobility               General bed mobility comments: pt OOB  Transfers Overall transfer level: Needs assistance Equipment used: Rolling walker (2 wheeled) Transfers: Sit to/from [facility] Sit to Stand: Supervision Stand pivot transfers: Supervision                 ADL Overall ADL's : Needs assistance/impaired     Grooming: Supervision/safety;Wash/dry hands;Wash/dry face;Standing           Upper Body Dressing : Set up;Sitting   Lower Body Dressing: Supervision/safety;Sit to/from stand;Cueing for safety;Cueing for sequencing Lower Body Dressing Details (indicate cue type and reason): pt has AE if needed Toilet Transfer: Supervision/safety;RW;Ambulation;Cueing for sequencing;Cueing for safety;Comfort height toilet   Toileting- Clothing Manipulation and Hygiene: Supervision/safety;Sit to/from stand;Cueing for safety;Cueing for sequencing   Tub/ Shower Transfer: Walk-in shower;Cueing for safety;Cueing for sequencing;Ambulation Tub/Shower Transfer Details (indicate cue type and reason): pt verbalized understanding Functional mobility during ADLs: Supervision/safety;Cueing for sequencing;Cueing for safety;Rolling walker General ADL Comments: pt has all needed DME as well as AE               Pertinent Vitals/Pain Pain Score: 2  Pain Location: right  thigh Pain Descriptors / Indicators: Sore Pain Intervention(s): Monitored  during session     Hand Dominance     Extremity/Trunk Assessment Upper Extremity Assessment Upper Extremity Assessment: Overall WFL for tasks assessed           Communication Communication Communication: No difficulties   Cognition Arousal/Alertness: Awake/alert Behavior During Therapy: WFL for tasks assessed/performed Overall Cognitive Status: Within Functional Limits for tasks assessed                                Home Living Family/patient expects to be discharged to:: Private residence Living Arrangements: Spouse/significant other;Children Available Help at Discharge: Family Type of Home: House Home Access: Stairs to enter Technical brewer of Steps: 1   Home Layout: One level     Bathroom Shower/Tub: Walk-in shower         Home Equipment: Environmental consultant - 2 wheels;Bedside commode          Prior Functioning/Environment Level of Independence: Independent                                        End of Session Equipment Utilized During Treatment: Rolling walker Nurse Communication: Mobility status  Activity Tolerance: Patient tolerated treatment well Patient left: in chair;with call bell/phone within reach   Time: [id number] OT Time Calculation (min): 9 min Charges:  OT Evaluation $OT Eval Low Complexity: 1 Procedure G-Codes:    Payton Mccallum D 11-05-2016, 11:04 AM

## 2016-10-14 NOTE — Progress Notes (Signed)
   Subjective: 1 Day Post-Op Procedure(s) (LRB): RIGHT TOTAL HIP ARTHROPLASTY ANTERIOR APPROACH (Right) Patient reports pain as mild.   We will start therapy today.  Plan is to go Home after hospital stay.  Objective: Vital signs in last 24 hours: Temp:  [95 F (35 C)-98 F (36.7 C)] 97.8 F (36.6 C) (01/18 0707) Pulse Rate:  [56-72] 72 (01/18 0707) Resp:  [12-16] 16 (01/18 0707) BP: (114-143)/(63-80) 115/66 (01/18 0707) SpO2:  [92 %-100 %] 100 % (01/18 0707)  Intake/Output from previous day:  Intake/Output Summary (Last 24 hours) at 10/14/16 0744 Last data filed at 10/14/16 0708  Gross per 24 hour  Intake             4135 ml  Output             3070 ml  Net             1065 ml    Intake/Output this shift: Total I/O In: -  Out: 300 [Urine:300]  Labs:  Recent Labs  10/14/16 0428  HGB 8.7*    Recent Labs  10/14/16 0428  WBC 9.5  RBC 2.81*  HCT 26.4*  PLT 223    Recent Labs  10/14/16 0428  NA 141  K 4.2  CL 111  CO2 25  BUN 16  CREATININE 0.60  GLUCOSE 118*  CALCIUM 8.6*   No results for input(s): LABPT, INR in the last 72 hours.  EXAM General - Patient is Alert, Appropriate and Oriented Extremity - Neurologically intact Neurovascular intact No cellulitis present Compartment soft Dressing - dressing C/D/I Motor Function - intact, moving foot and toes well on exam.  Hemovac pulled without difficulty.  Past Medical History:  Diagnosis Date  . Allergy   . Arthritis   . Colon polyps   . History of blood transfusion    with previous surgeries  . Hyperlipidemia   . Irregular heart rate   . PONV (postoperative nausea and vomiting)    hx of pt. states she thinks was from pain med.    Assessment/Plan: 1 Day Post-Op Procedure(s) (LRB): RIGHT TOTAL HIP ARTHROPLASTY ANTERIOR APPROACH (Right) Principal Problem:   OA (osteoarthritis) of hip   Advance diet Up with therapy D/C IV fluids Plan for discharge tomorrow  DVT Prophylaxis -  Xarelto Weight Bearing As Tolerated right Leg Hemovac Pulled Begin Therapy  Gearlean Alf

## 2016-10-15 LAB — CBC
HCT: 25.2 % — ABNORMAL LOW (ref 36.0–46.0)
Hemoglobin: 8.6 g/dL — ABNORMAL LOW (ref 12.0–15.0)
MCH: 32.1 pg (ref 26.0–34.0)
MCHC: 34.1 g/dL (ref 30.0–36.0)
MCV: 94 fL (ref 78.0–100.0)
PLATELETS: 203 10*3/uL (ref 150–400)
RBC: 2.68 MIL/uL — ABNORMAL LOW (ref 3.87–5.11)
RDW: 14.7 % (ref 11.5–15.5)
WBC: 12 10*3/uL — ABNORMAL HIGH (ref 4.0–10.5)

## 2016-10-15 LAB — BASIC METABOLIC PANEL
Anion gap: 3 — ABNORMAL LOW (ref 5–15)
BUN: 20 mg/dL (ref 6–20)
CALCIUM: 8.6 mg/dL — AB (ref 8.9–10.3)
CHLORIDE: 111 mmol/L (ref 101–111)
CO2: 26 mmol/L (ref 22–32)
CREATININE: 0.47 mg/dL (ref 0.44–1.00)
Glucose, Bld: 115 mg/dL — ABNORMAL HIGH (ref 65–99)
Potassium: 3.9 mmol/L (ref 3.5–5.1)
SODIUM: 140 mmol/L (ref 135–145)

## 2016-10-15 MED ORDER — RIVAROXABAN 10 MG PO TABS: 10.0000 mg | ORAL_TABLET | Freq: Every day | ORAL | 0 refills | Status: DC

## 2016-10-15 MED ORDER — METHOCARBAMOL 500 MG PO TABS: 500.0000 mg | ORAL_TABLET | Freq: Four times a day (QID) | ORAL | 0 refills | Status: DC | PRN

## 2016-10-15 MED ORDER — OXYCODONE HCL 5 MG PO TABS: 5.0000 mg | ORAL_TABLET | ORAL | 0 refills | Status: DC | PRN

## 2016-10-15 MED ORDER — TRAMADOL HCL 50 MG PO TABS: 50.0000 mg | ORAL_TABLET | Freq: Four times a day (QID) | ORAL | 0 refills | Status: DC | PRN

## 2016-10-15 MED ORDER — POLYSACCHARIDE IRON COMPLEX 150 MG PO CAPS: 150.0000 mg | ORAL_CAPSULE | Freq: Two times a day (BID) | ORAL | 0 refills | Status: DC

## 2016-10-15 MED ORDER — POLYSACCHARIDE IRON COMPLEX 150 MG PO CAPS
150.0000 mg | ORAL_CAPSULE | Freq: Every day | ORAL | Status: DC
  Administered 2016-10-15: 11:00:00 150 mg via ORAL
  Filled 2016-10-15: qty 1

## 2016-10-15 NOTE — Progress Notes (Signed)
   Subjective: 2 Days Post-Op Procedure(s) (LRB): RIGHT TOTAL HIP ARTHROPLASTY ANTERIOR APPROACH (Right) Patient reports pain as mild.   Plan is to go Home after hospital stay.  Objective: Vital signs in last 24 hours: Temp:  [98 F (36.7 C)-98.3 F (36.8 C)] 98.2 F (36.8 C) (01/19 0653) Pulse Rate:  [71-77] 74 (01/19 0653) Resp:  [15-16] 16 (01/19 0653) BP: (116-145)/(60-78) 143/60 (01/19 0653) SpO2:  [100 %] 100 % (01/19 0653)  Intake/Output from previous day:  Intake/Output Summary (Last 24 hours) at 10/15/16 0803 Last data filed at 10/15/16 0742  Gross per 24 hour  Intake          1516.17 ml  Output             1925 ml  Net          -408.83 ml    Intake/Output this shift: Total I/O In: 120 [P.O.:120] Out: -   Labs:  Recent Labs  10/14/16 0428 10/15/16 0426  HGB 8.7* 8.6*    Recent Labs  10/14/16 0428 10/15/16 0426  WBC 9.5 12.0*  RBC 2.81* 2.68*  HCT 26.4* 25.2*  PLT 223 203    Recent Labs  10/14/16 0428 10/15/16 0426  NA 141 140  K 4.2 3.9  CL 111 111  CO2 25 26  BUN 16 20  CREATININE 0.60 0.47  GLUCOSE 118* 115*  CALCIUM 8.6* 8.6*   No results for input(s): LABPT, INR in the last 72 hours.  EXAM General - Patient is Alert, Appropriate and Oriented Extremity - Neurologically intact Neurovascular intact No cellulitis present Compartment soft Dressing/Incision - clean, dry, no drainage Motor Function - intact, moving foot and toes well on exam.   Past Medical History:  Diagnosis Date  . Allergy   . Arthritis   . Colon polyps   . History of blood transfusion    with previous surgeries  . Hyperlipidemia   . Irregular heart rate   . PONV (postoperative nausea and vomiting)    hx of pt. states she thinks was from pain med.    Assessment/Plan: 2 Days Post-Op Procedure(s) (LRB): RIGHT TOTAL HIP ARTHROPLASTY ANTERIOR APPROACH (Right) Principal Problem:   OA (osteoarthritis) of hip   Discharge home with home health  DVT  Prophylaxis - Xarelto Weight Bearing As Tolerated right Leg  Kathryn [name] 10/15/2016, 8:03 AM

## 2016-10-15 NOTE — Discharge Summary (Signed)
Physician Discharge Summary   Patient ID: Kathryn [name] MRN: [id number] DOB/AGE: [birth year] 83 y.o.  Admit date: 10/13/2016 Discharge date: 10-15-2016  Primary Diagnosis:  Osteoarthritis of the Right hip.   Admission Diagnoses:  Past Medical History:  Diagnosis Date  . Allergy   . Arthritis   . Colon polyps   . History of blood transfusion    with previous surgeries  . Hyperlipidemia   . Irregular heart rate   . PONV (postoperative nausea and vomiting)    hx of pt. states she thinks was from pain med.   Discharge Diagnoses:   Principal Problem:   OA (osteoarthritis) of hip  Estimated body mass index is 19.58 kg/m as calculated from the following:   Height as of this encounter: _0  (1.702 m).   Weight as of this encounter: 56.7 kg (125 lb).  Procedure(s) (LRB): RIGHT TOTAL HIP ARTHROPLASTY ANTERIOR APPROACH (Right)   Consults: None  HPI:  Kathryn [name] is a 83 y.o. female who has advanced end-  stage arthritis of their Right  hip with progressively worsening pain and  dysfunction.The patient has failed nonoperative management and presents for  total hip arthroplasty.   Laboratory Data: Admission on 10/13/2016  Component Date Value Ref Range Status  . WBC 10/14/2016 9.5  4.0 - 10.5 K/uL Final  . RBC 10/14/2016 2.81* 3.87 - 5.11 MIL/uL Final  . Hemoglobin 10/14/2016 8.7* 12.0 - 15.0 g/dL Final  . HCT 10/14/2016 26.4* 36.0 - 46.0 % Final  . MCV 10/14/2016 94.0  78.0 - 100.0 fL Final  . MCH 10/14/2016 31.0  26.0 - 34.0 pg Final  . MCHC 10/14/2016 33.0  30.0 - 36.0 g/dL Final  . RDW 10/14/2016 14.4  11.5 - 15.5 % Final  . Platelets 10/14/2016 223  150 - 400 K/uL Final  . Sodium 10/14/2016 141  135 - 145 mmol/L Final  . Potassium 10/14/2016 4.2  3.5 - 5.1 mmol/L Final  . Chloride 10/14/2016 111  101 - 111 mmol/L Final  . CO2 10/14/2016 25  22 - 32 mmol/L Final  . Glucose, Bld 10/14/2016 118* 65 - 99 mg/dL Final  . BUN 10/14/2016 16  6 - 20 mg/dL Final  .  Creatinine, Ser 10/14/2016 0.60  0.44 - 1.00 mg/dL Final  . Calcium 10/14/2016 8.6* 8.9 - 10.3 mg/dL Final  . GFR calc non Af Amer 10/14/2016 >60  >60 mL/min Final  . GFR calc Af Amer 10/14/2016 >60  >60 mL/min Final   Comment: (NOTE) The eGFR has been calculated using the CKD EPI equation. This calculation has not been validated in all clinical situations. eGFR's persistently <60 mL/min signify possible Chronic Kidney Disease.   . Anion gap 10/14/2016 5  5 - 15 Final  . WBC 10/15/2016 12.0* 4.0 - 10.5 K/uL Final  . RBC 10/15/2016 2.68* 3.87 - 5.11 MIL/uL Final  . Hemoglobin 10/15/2016 8.6* 12.0 - 15.0 g/dL Final  . HCT 10/15/2016 25.2* 36.0 - 46.0 % Final  . MCV 10/15/2016 94.0  78.0 - 100.0 fL Final  . MCH 10/15/2016 32.1  26.0 - 34.0 pg Final  . MCHC 10/15/2016 34.1  30.0 - 36.0 g/dL Final  . RDW 10/15/2016 14.7  11.5 - 15.5 % Final  . Platelets 10/15/2016 203  150 - 400 K/uL Final  . Sodium 10/15/2016 140  135 - 145 mmol/L Final  . Potassium 10/15/2016 3.9  3.5 - 5.1 mmol/L Final  . Chloride 10/15/2016 111  101 - 111 mmol/L Final  .  CO2 10/15/2016 26  22 - 32 mmol/L Final  . Glucose, Bld 10/15/2016 115* 65 - 99 mg/dL Final  . BUN 10/15/2016 20  6 - 20 mg/dL Final  . Creatinine, Ser 10/15/2016 0.47  0.44 - 1.00 mg/dL Final  . Calcium 10/15/2016 8.6* 8.9 - 10.3 mg/dL Final  . GFR calc non Af Amer 10/15/2016 >60  >60 mL/min Final  . GFR calc Af Amer 10/15/2016 >60  >60 mL/min Final   Comment: (NOTE) The eGFR has been calculated using the CKD EPI equation. This calculation has not been validated in all clinical situations. eGFR's persistently <60 mL/min signify possible Chronic Kidney Disease.   Georgiann Hahn gap 10/15/2016 3* 5 - 15 Final  Hospital Outpatient Visit on 10/04/2016  Component Date Value Ref Range Status  . aPTT 10/04/2016 31  24 - 36 seconds Final  . WBC 10/04/2016 6.8  4.0 - 10.5 K/uL Final  . RBC 10/04/2016 3.52* 3.87 - 5.11 MIL/uL Final  . Hemoglobin 10/04/2016  10.9* 12.0 - 15.0 g/dL Final  . HCT 10/04/2016 33.3* 36.0 - 46.0 % Final  . MCV 10/04/2016 94.6  78.0 - 100.0 fL Final  . MCH 10/04/2016 31.0  26.0 - 34.0 pg Final  . MCHC 10/04/2016 32.7  30.0 - 36.0 g/dL Final  . RDW 10/04/2016 14.7  11.5 - 15.5 % Final  . Platelets 10/04/2016 288  150 - 400 K/uL Final  . Sodium 10/04/2016 140  135 - 145 mmol/L Final  . Potassium 10/04/2016 4.4  3.5 - 5.1 mmol/L Final  . Chloride 10/04/2016 107  101 - 111 mmol/L Final  . CO2 10/04/2016 27  22 - 32 mmol/L Final  . Glucose, Bld 10/04/2016 83  65 - 99 mg/dL Final  . BUN 10/04/2016 22* 6 - 20 mg/dL Final  . Creatinine, Ser 10/04/2016 0.50  0.44 - 1.00 mg/dL Final  . Calcium 10/04/2016 9.0  8.9 - 10.3 mg/dL Final  . Total Protein 10/04/2016 7.2  6.5 - 8.1 g/dL Final  . Albumin 10/04/2016 4.1  3.5 - 5.0 g/dL Final  . AST 10/04/2016 28  15 - 41 U/L Final  . ALT 10/04/2016 20  14 - 54 U/L Final  . Alkaline Phosphatase 10/04/2016 73  38 - 126 U/L Final  . Total Bilirubin 10/04/2016 0.7  0.3 - 1.2 mg/dL Final  . GFR calc non Af Amer 10/04/2016 >60  >60 mL/min Final  . GFR calc Af Amer 10/04/2016 >60  >60 mL/min Final   Comment: (NOTE) The eGFR has been calculated using the CKD EPI equation. This calculation has not been validated in all clinical situations. eGFR's persistently <60 mL/min signify possible Chronic Kidney Disease.   . Anion gap 10/04/2016 6  5 - 15 Final  . Prothrombin Time 10/04/2016 12.8  11.4 - 15.2 seconds Final  . INR 10/04/2016 0.96   Final  . ABO/RH(D) 10/04/2016 O POS   Final  . Antibody Screen 10/04/2016 NEG   Final  . Sample Expiration 10/04/2016 10/16/2016   Final  . Extend sample reason 10/04/2016 NO TRANSFUSIONS OR PREGNANCY IN THE PAST 3 MONTHS   Final  . Color, Urine 10/04/2016 YELLOW  YELLOW Final  . APPearance 10/04/2016 CLEAR  CLEAR Final  . Specific Gravity, Urine 10/04/2016 1.020  1.005 - 1.030 Final  . pH 10/04/2016 6.0  5.0 - 8.0 Final  . Glucose, UA 10/04/2016  NEGATIVE  NEGATIVE mg/dL Final  . Hgb urine dipstick 10/04/2016 NEGATIVE  NEGATIVE Final  . Bilirubin Urine  10/04/2016 NEGATIVE  NEGATIVE Final  . Ketones, ur 10/04/2016 5* NEGATIVE mg/dL Final  . Protein, ur 10/04/2016 NEGATIVE  NEGATIVE mg/dL Final  . Nitrite 10/04/2016 NEGATIVE  NEGATIVE Final  . Leukocytes, UA 10/04/2016 NEGATIVE  NEGATIVE Final  . MRSA, PCR 10/04/2016 NEGATIVE  NEGATIVE Final  . Staphylococcus aureus 10/04/2016 NEGATIVE  NEGATIVE Final   Comment:        The Xpert SA Assay (FDA approved for NASAL specimens in patients over 49 years of age), is one component of a comprehensive surveillance program.  Test performance has been validated by [facility] for patients greater than or equal to 63 year old. It is not intended to diagnose infection nor to guide or monitor treatment.   Office Visit on 09/21/2016  Component Date Value Ref Range Status  . Sodium 09/28/2016 140  135 - 146 mmol/L Final  . Potassium 09/28/2016 4.9  3.5 - 5.3 mmol/L Final  . Chloride 09/28/2016 109  98 - 110 mmol/L Final  . CO2 09/28/2016 26  20 - 31 mmol/L Final  . Glucose, Bld 09/28/2016 89  65 - 99 mg/dL Final  . BUN 09/28/2016 29* 7 - 25 mg/dL Final  . Creat 09/28/2016 0.66  0.60 - 0.88 mg/dL Final   Comment:   For patients > or = 81 years of age: The upper reference limit for Creatinine is approximately 13% higher for people identified as African-American.     . Calcium 09/28/2016 9.2  8.6 - 10.4 mg/dL Final     X-Rays:Dg Pelvis Portable  Result Date: 10/13/2016 CLINICAL DATA:  Post right hip replacement. EXAM: PORTABLE PELVIS 1-2 VIEWS COMPARISON:  11/03/2005 FINDINGS: A right hip arthroplasty has been placed. Right hip appears located on this single AP portable view. Again noted is a left total hip arthroplasty which also appears located. Degenerative changes at the pubic symphysis. Multiple phleboliths in the pelvis. There is a surgical drain just lateral to the right hip.  IMPRESSION: New right hip arthroplasty without complicating features. Old left hip arthroplasty. Electronically Signed   By: Markus Daft M.D.   On: 10/13/2016 10:36   Ct Coronary Morph W/cta Cor W/score W/ca W/cm &/or Wo/cm  Addendum Date: 10/06/2016   ADDENDUM REPORT: 10/06/2016 16:27 CLINICAL DATA:  83 year old female with atypical chest pain. EXAM: Cardiac/Coronary  CT TECHNIQUE: The patient was scanned on a Philips 256 scanner. FINDINGS: A 120 kV prospective scan was triggered in the descending thoracic aorta at 111 HU's. Axial non-contrast 3 mm slices were carried out through the heart. The data set was analyzed on a dedicated work station and scored using the [place]. Gantry rotation speed was 270 msecs and collimation was .9 mm. 5 mg of iv Metoprolol and 0.8 mg of sl NTG was given. The 3D data set was reconstructed in 5% intervals of the 67-82 % of the R-R cycle. Diastolic phases were analyzed on a dedicated work station using MPR, MIP and VRT modes. The patient received 80 cc of contrast. Aorta:  Described by [facility] radiology. Aortic root is dilated measuring 43 mm (left cusp) x 40 mm (right cusp) x 40 mm (non-coronary cusp). Aortic Valve:  Trileaflet.  No calcifications. Coronary Arteries:  Normal coronary origin.  Right dominance. RCA is a large dominant artery that gives rise to PDA and PLVB. There is no plaque. Left main is a very large artery that gives rise to LAD and LCX arteries. LAD is a large tortuous vessel that gives rise to two diagonal arteries.  LAD has a minimal calcified plaque in the proximal segment with 0-25% stenosis. D1 is rather small and has no obvious plaque. D2 is a large tortuous vessel that has no plaque. LCX is a large tortuous non-dominant artery that gives rise to one large OM1 branch. There is no plaque. Other findings: Normal pulmonary vein drainage into the left atrium. Normal let atrial appendage without a thrombus. Normal size of the pulmonary artery.  IMPRESSION: 1. Coronary calcium score of 2. This was 5 percentile for age and sex matched control. 2. Normal coronary origin with right dominance. 3. Minimal non-obstructive CAD. Ena Dawley Electronically Signed   By: Ena Dawley   On: 10/06/2016 16:27   Result Date: 10/06/2016 EXAM: OVER-READ INTERPRETATION  CT CHEST The following report is an over-read performed by radiologist Dr. Collene Leyden [facility] [place] on 10/06/2016. This over-read does not include interpretation of cardiac or coronary anatomy or pathology. The coronary CTA interpretation by the cardiologist is attached. COMPARISON:  None. FINDINGS: Cardiovascular: Mild aneurysmal dilatation of the aortic root measuring 4.4 cm at the sinuses of Valsalva and 4.0 cm in the proximal descending thoracic aorta. Scattered aortic calcifications. Heart is normal size. Mediastinum/Nodes: No mediastinal, hilar, or axillary adenopathy. Lungs/Pleura: No suspicious pulmonary nodules or pleural effusions. Linear scarring in the right lung base. Upper Abdomen: Imaging into the upper abdomen shows no acute findings. Musculoskeletal: Chest wall soft tissues are unremarkable. No acute bony abnormality or focal bone lesion. IMPRESSION: Mild aneurysmal dilatation of the aortic root and ascending thoracic aorta, 4.4 cm maximally at the sinuses of Valsalva. Recommend annual imaging followup by CTA or MRA. This recommendation follows 2010 ACCF/AHA/AATS/ACR/ASA/SCA/SCAI/SIR/STS/SVM Guidelines for the Diagnosis and Management of Patients with Thoracic Aortic Disease. Circulation. 2010; 121: J242-A834 Electronically Signed: By: Rolm Baptise M.D. On: 10/06/2016 11:16   Dg C-arm 1-60 Min-no Report  Result Date: 10/13/2016 There is no Radiologist interpretation  for this exam.   EKG: Orders placed or performed in visit on 09/21/16  . EKG 12-Lead     Hospital Course: Patient was admitted to [facility] and taken to the OR and underwent the above  state procedure without complications.  Patient tolerated the procedure well and was later transferred to the recovery room and then to the orthopaedic floor for postoperative care.  They were given PO and IV analgesics for pain control following their surgery.  They were given 24 hours of postoperative antibiotics of  Anti-infectives    Start     Dose/Rate Route Frequency Ordered Stop   10/13/16 1400  ceFAZolin (ANCEF) IVPB 2g/100 mL premix     2 g 200 mL/hr over 30 Minutes Intravenous Every 6 hours 10/13/16 1150 10/13/16 2053   10/13/16 0635  ceFAZolin (ANCEF) IVPB 2g/100 mL premix     2 g 200 mL/hr over 30 Minutes Intravenous On call to O.R. 10/13/16 1962 10/13/16 0900     and started on DVT prophylaxis in the form of Xarelto.   PT and OT were ordered for total hip protocol.  The patient was allowed to be WBAT with therapy. Discharge planning was consulted to help with postop disposition and equipment needs.  Patient had a decent night on the evening of surgery.  They started to get up OOB with therapy on day one.  Hemovac drain was pulled without difficulty.  Continued to work with therapy into day two.  Dressing was changed on day two and the incision was healing well.   Patient was seen  in rounds on POD 2 by Dr. Wynelle Link and was ready to go home.  Diet: Cardiac diet Activity:WBAT Follow-up:in 2 weeks Disposition - Home Discharged Condition: good   Discharge Instructions    Call MD / Call 911    Complete by:  As directed    If you experience chest pain or shortness of breath, CALL 911 and be transported to the hospital emergency room.  If you develope a fever above 101 F, pus (white drainage) or increased drainage or redness at the wound, or calf pain, call your surgeon's office.   Change dressing    Complete by:  As directed    You may change your dressing dressing daily with sterile 4 x 4 inch gauze dressing and paper tape.  Do not submerge the incision under water.   Constipation  Prevention    Complete by:  As directed    Drink plenty of fluids.  Prune juice may be helpful.  You may use a stool softener, such as Colace (over the counter) 100 mg twice a day.  Use MiraLax (over the counter) for constipation as needed.   Diet general    Complete by:  As directed    Discharge instructions    Complete by:  As directed    Pick up stool softner and laxative for home use following surgery while on pain medications. Do not submerge incision under water. Please use good hand washing techniques while changing dressing each day. May shower starting three days after surgery. Please use a clean towel to pat the incision dry following showers. Continue to use ice for pain and swelling after surgery. Do not use any lotions or creams on the incision until instructed by your surgeon.  Wear both TED hose on both legs during the day every day for three weeks, but may have off at night at home.  Postoperative Constipation Protocol  Constipation - defined medically as fewer than three stools per week and severe constipation as less than one stool per week.  One of the most common issues patients have following surgery is constipation.  Even if you have a regular bowel pattern at home, your normal regimen is likely to be disrupted due to multiple reasons following surgery.  Combination of anesthesia, postoperative narcotics, change in appetite and fluid intake all can affect your bowels.  In order to avoid complications following surgery, here are some recommendations in order to help you during your recovery period.  Colace (docusate) - Pick up an over-the-counter form of Colace or another stool softener and take twice a day as long as you are requiring postoperative pain medications.  Take with a full glass of water daily.  If you experience loose stools or diarrhea, hold the colace until you stool forms back up.  If your symptoms do not get better within 1 week or if they get worse, check  with your doctor.  Dulcolax (bisacodyl) - Pick up over-the-counter and take as directed by the product packaging as needed to assist with the movement of your bowels.  Take with a full glass of water.  Use this product as needed if not relieved by Colace only.   MiraLax (polyethylene glycol) - Pick up over-the-counter to have on hand.  MiraLax is a solution that will increase the amount of water in your bowels to assist with bowel movements.  Take as directed and can mix with a glass of water, juice, soda, coffee, or tea.  Take if you go more than  two days without a movement. Do not use MiraLax more than once per day. Call your doctor if you are still constipated or irregular after using this medication for 7 days in a row.  If you continue to have problems with postoperative constipation, please contact the office for further assistance and recommendations.  If you experience "the worst abdominal pain ever" or develop nausea or vomiting, please contact the office immediatly for further recommendations for treatment.   Take Xarelto for two and a half more weeks, then discontinue Xarelto. Once the patient has completed the blood thinner regimen, then take a Baby 81 mg Aspirin daily for three more weeks.   Do not sit on low chairs, stoools or toilet seats, as it may be difficult to get up from low surfaces    Complete by:  As directed    Driving restrictions    Complete by:  As directed    No driving until released by the physician.   Increase activity slowly as tolerated    Complete by:  As directed    Lifting restrictions    Complete by:  As directed    No lifting until released by the physician.   Patient may shower    Complete by:  As directed    You may shower without a dressing once there is no drainage.  Do not wash over the wound.  If drainage remains, do not shower until drainage stops.   TED hose    Complete by:  As directed    Use stockings (TED hose) for 3 weeks on both leg(s).   You may remove them at night for sleeping.   Weight bearing as tolerated    Complete by:  As directed    Laterality:  right   Extremity:  Lower     Allergies as of 10/15/2016      Reactions   Macrobid [nitrofurantoin Monohyd Macro]    Made pt sick to per stomach and shaky      Medication List    STOP taking these medications   calcium carbonate 600 MG Tabs tablet Commonly known as:  OS-CAL   diclofenac 75 MG EC tablet Commonly known as:  VOLTAREN     TAKE these medications   cetirizine 10 MG tablet Commonly known as:  ZYRTEC TAKE 1 TABLET (10 MG TOTAL) BY MOUTH DAILY.   iron polysaccharides 150 MG capsule Commonly known as:  NIFEREX Take 1 capsule (150 mg total) by mouth 2 (two) times daily. Take for three weeks and then discontinue the iron supplement.   methocarbamol 500 MG tablet Commonly known as:  ROBAXIN Take 1 tablet (500 mg total) by mouth every 6 (six) hours as needed for muscle spasms.   oxyCODONE 5 MG immediate release tablet Commonly known as:  Oxy IR/ROXICODONE Take 1-2 tablets (5-10 mg total) by mouth every 4 (four) hours as needed for moderate pain or severe pain.   rivaroxaban 10 MG Tabs tablet Commonly known as:  XARELTO Take 1 tablet (10 mg total) by mouth daily with breakfast. Take Xarelto for two and a half more weeks following discharge from the hospital, then discontinue Xarelto. Once the patient has completed the blood thinner regimen, then take a Baby 81 mg Aspirin daily for three more weeks. Start taking on:  10/16/2016   simvastatin 40 MG tablet Commonly known as:  ZOCOR TAKE 1 TABLET AT BEDTIME   traMADol 50 MG tablet Commonly known as:  ULTRAM Take 1-2 tablets (50-100 mg total)  by mouth every 6 (six) hours as needed (mild pain).      Follow-up Information    [facility] Follow up.   Specialty:  Home Health Services Why:  home health physical therapy Contact information: [address] [place]  [address] [phone]        Gearlean Alf, MD. Schedule an appointment as soon as possible for a visit on 10/26/2016.   Specialty:  Orthopedic Surgery Contact information: [address] [place] [address] [phone]           Signed: Arlee Muslim, PA-C Orthopaedic Surgery 10/15/2016, 8:51 AM

## 2016-10-15 NOTE — Progress Notes (Signed)
RN reviewed discharge instructions with patient and family. All questions answered.   Paperwork and prescriptions given.   NT rolled patient down with all belongings to family car. 

## 2016-10-17 DIAGNOSIS — I499 Cardiac arrhythmia, unspecified: Secondary | ICD-10-CM | POA: Diagnosis not present

## 2016-10-17 DIAGNOSIS — Z471 Aftercare following joint replacement surgery: Secondary | ICD-10-CM | POA: Diagnosis not present

## 2016-10-17 DIAGNOSIS — M069 Rheumatoid arthritis, unspecified: Secondary | ICD-10-CM | POA: Diagnosis not present

## 2016-10-17 DIAGNOSIS — M810 Age-related osteoporosis without current pathological fracture: Secondary | ICD-10-CM | POA: Diagnosis not present

## 2016-10-17 DIAGNOSIS — I440 Atrioventricular block, first degree: Secondary | ICD-10-CM | POA: Diagnosis not present

## 2016-10-17 DIAGNOSIS — M1990 Unspecified osteoarthritis, unspecified site: Secondary | ICD-10-CM | POA: Diagnosis not present

## 2016-10-19 DIAGNOSIS — M1990 Unspecified osteoarthritis, unspecified site: Secondary | ICD-10-CM | POA: Diagnosis not present

## 2016-10-19 DIAGNOSIS — Z471 Aftercare following joint replacement surgery: Secondary | ICD-10-CM | POA: Diagnosis not present

## 2016-10-19 DIAGNOSIS — I499 Cardiac arrhythmia, unspecified: Secondary | ICD-10-CM | POA: Diagnosis not present

## 2016-10-19 DIAGNOSIS — I440 Atrioventricular block, first degree: Secondary | ICD-10-CM | POA: Diagnosis not present

## 2016-10-19 DIAGNOSIS — M810 Age-related osteoporosis without current pathological fracture: Secondary | ICD-10-CM | POA: Diagnosis not present

## 2016-10-19 DIAGNOSIS — M069 Rheumatoid arthritis, unspecified: Secondary | ICD-10-CM | POA: Diagnosis not present

## 2016-10-22 DIAGNOSIS — I499 Cardiac arrhythmia, unspecified: Secondary | ICD-10-CM | POA: Diagnosis not present

## 2016-10-22 DIAGNOSIS — M1990 Unspecified osteoarthritis, unspecified site: Secondary | ICD-10-CM | POA: Diagnosis not present

## 2016-10-22 DIAGNOSIS — M069 Rheumatoid arthritis, unspecified: Secondary | ICD-10-CM | POA: Diagnosis not present

## 2016-10-22 DIAGNOSIS — Z471 Aftercare following joint replacement surgery: Secondary | ICD-10-CM | POA: Diagnosis not present

## 2016-10-22 DIAGNOSIS — I440 Atrioventricular block, first degree: Secondary | ICD-10-CM | POA: Diagnosis not present

## 2016-10-22 DIAGNOSIS — M810 Age-related osteoporosis without current pathological fracture: Secondary | ICD-10-CM | POA: Diagnosis not present

## 2016-10-25 DIAGNOSIS — M1990 Unspecified osteoarthritis, unspecified site: Secondary | ICD-10-CM | POA: Diagnosis not present

## 2016-10-25 DIAGNOSIS — M810 Age-related osteoporosis without current pathological fracture: Secondary | ICD-10-CM | POA: Diagnosis not present

## 2016-10-25 DIAGNOSIS — M069 Rheumatoid arthritis, unspecified: Secondary | ICD-10-CM | POA: Diagnosis not present

## 2016-10-25 DIAGNOSIS — I440 Atrioventricular block, first degree: Secondary | ICD-10-CM | POA: Diagnosis not present

## 2016-10-25 DIAGNOSIS — Z471 Aftercare following joint replacement surgery: Secondary | ICD-10-CM | POA: Diagnosis not present

## 2016-10-25 DIAGNOSIS — I499 Cardiac arrhythmia, unspecified: Secondary | ICD-10-CM | POA: Diagnosis not present

## 2016-10-26 DIAGNOSIS — Z96641 Presence of right artificial hip joint: Secondary | ICD-10-CM | POA: Diagnosis not present

## 2016-10-26 DIAGNOSIS — Z471 Aftercare following joint replacement surgery: Secondary | ICD-10-CM | POA: Diagnosis not present

## 2016-10-28 DIAGNOSIS — M1990 Unspecified osteoarthritis, unspecified site: Secondary | ICD-10-CM | POA: Diagnosis not present

## 2016-10-28 DIAGNOSIS — M069 Rheumatoid arthritis, unspecified: Secondary | ICD-10-CM | POA: Diagnosis not present

## 2016-10-28 DIAGNOSIS — I499 Cardiac arrhythmia, unspecified: Secondary | ICD-10-CM | POA: Diagnosis not present

## 2016-10-28 DIAGNOSIS — M810 Age-related osteoporosis without current pathological fracture: Secondary | ICD-10-CM | POA: Diagnosis not present

## 2016-10-28 DIAGNOSIS — Z471 Aftercare following joint replacement surgery: Secondary | ICD-10-CM | POA: Diagnosis not present

## 2016-10-28 DIAGNOSIS — I440 Atrioventricular block, first degree: Secondary | ICD-10-CM | POA: Diagnosis not present

## 2016-11-02 ENCOUNTER — Ambulatory Visit: Payer: Medicare Other | Admitting: Cardiovascular Disease

## 2016-11-04 NOTE — Progress Notes (Signed)
Cardiology Office Note   Date:  11/05/2016   ID:  Kathryn [name], DOB October 20, 1930, MRN [id number]  PCP:  Eliezer Lofts, MD  Cardiologist:   Skeet Latch, MD   No chief complaint on file.   History of Present Illness: Kathryn [name] is a 83 y.o. female with hyperlipidemia and palpitations who presents for follow up.  She was initially see 08/2016 for evaluation of an abnormal stress test.  Kathryn [name] saw Dr. Daleen Squibb on 10/17 prior to a right hip replacement.  She reported intermittent episodes of chest pressure throughout Kathryn preceding year.  She was referred for a Lexiscan Myoview 08/03/16 that revealed LVEF 56% with apical septal hypokinesis. She was also noted to have a fixed defect in Kathryn basal-mid inferolateral, mid anteroseptal, and apical septal regions. There was no evidence of ischemia.  She was asymptomatic so she was for a cardiac CT-A that revealed minimal, non-obstructive coronary disease (25% proximal LAD) and mild dilation of Kathryn aortic root. She underwent successful hip replacement [id number] without complication.   Since her surgery Kathryn [name] has been doing well.  She has been working with physical therapy and denies chest pain or shortness of breath.  She sometimes has mild swelling and numbness of Kathryn L lower extremity that is chronic.  She denies orthopnea or PND.     Past Medical History:  Diagnosis Date  . Allergy   . Arthritis   . Colon polyps   . History of blood transfusion    with previous surgeries  . Hyperlipidemia   . Irregular heart rate   . PONV (postoperative nausea and vomiting)    hx of pt. states she thinks was from pain med.    Past Surgical History:  Procedure Laterality Date  . CARDIAC CATHETERIZATION    . HAND SURGERY Left   . TOTAL HIP ARTHROPLASTY  2007   LEFT  . TOTAL HIP ARTHROPLASTY Right 10/13/2016   Procedure: RIGHT TOTAL HIP ARTHROPLASTY ANTERIOR APPROACH;  Surgeon: Gaynelle Arabian, MD;  Location: [facility];  Service: Orthopedics;   Laterality: Right;  . TOTAL KNEE ARTHROPLASTY     Right   . TUBAL LIGATION       Current Outpatient Prescriptions  Medication Sig Dispense Refill  . cetirizine (ZYRTEC) 10 MG tablet TAKE 1 TABLET (10 MG TOTAL) BY MOUTH DAILY. 90 tablet 3  . iron polysaccharides (NIFEREX) 150 MG capsule Take 1 capsule (150 mg total) by mouth 2 (two) times daily. Take for three weeks and then discontinue Kathryn iron supplement. 42 capsule 0  . rivaroxaban (XARELTO) 10 MG TABS tablet Take 1 tablet (10 mg total) by mouth daily with breakfast. Take Xarelto for two and a half more weeks following discharge from Kathryn hospital, then discontinue Xarelto. Once Kathryn [name] has completed Kathryn blood thinner regimen, then take a Baby 81 mg Aspirin daily for three more weeks. 19 tablet 0  . simvastatin (ZOCOR) 40 MG tablet TAKE 1 TABLET AT BEDTIME 90 tablet 3  . methocarbamol (ROBAXIN) 500 MG tablet Take 1 tablet (500 mg total) by mouth every 6 (six) hours as needed for muscle spasms. ([name] not taking: Reported on 11/05/2016) 80 tablet 0   No current facility-administered medications for this visit.     Allergies:   Macrobid [nitrofurantoin monohyd macro]    Social History:  Kathryn [name]  reports that she has never smoked. She has never used smokeless tobacco. She reports that she drinks about 1.8 oz of alcohol per  week . She reports that she does not use drugs.   Family History:  Kathryn [name]'s family history includes Bone cancer in her sister; Breast cancer in her sister; Heart attack in her brother; Heart disease in her brother, brother, and sister; Hypertension in her brother and sister; Pancreatic cancer in her mother.    ROS:  Please see Kathryn history of present illness.   Otherwise, review of systems are positive for none.   All other systems are reviewed and negative.    PHYSICAL EXAM: VS:  BP 117/80   Pulse 79   Ht 5\' 7"  (1.702 m)   Wt 55.3 kg (122 lb)   SpO2 99%   BMI 19.11 kg/m  , BMI Body mass index is  19.11 kg/m. GENERAL:  Well appearing HEENT:  Pupils equal round and reactive, fundi not visualized, oral mucosa unremarkable NECK:  No jugular venous distention, waveform within normal limits, carotid upstroke brisk and symmetric, no bruits LYMPHATICS:  No cervical adenopathy LUNGS:  Clear to auscultation bilaterally HEART:  RRR.  PMI not displaced or sustained,S1 and S2 within normal limits, no S3, no S4, no clicks, no rubs, no murmurs ABD:  Flat, positive bowel sounds normal in frequency in pitch, no bruits, no rebound, no guarding, no midline pulsatile mass, no hepatomegaly, no splenomegaly EXT:  2 plus pulses throughout, 1+ pitting edema to L ankle, no cyanosis no clubbing SKIN:  No rashes no nodules NEURO:  Cranial nerves II through XII grossly intact, motor grossly intact throughout PSYCH:  Cognitively intact, oriented to person place and time  EKG:  EKG is ordered today. Kathryn ekg ordered today demonstrates sinus rhythm rate 64 bpm.  RSR' V1.  Prior anteroseptal infarct.   Lexiscan Myoview 08/03/16:  Nuclear stress EF: 56%. Apical septal wall hypokinesis  Defect 1: There is a medium defect of severe severity present in Kathryn basal inferolateral, mid anteroseptal, mid inferolateral and apical septal location. Defect is fixed consistent with infarct.  Findings consistent with prior myocardial infarction.  There was no ST segment deviation noted during stress.  This is an intermediate risk study. Infarct pattern as described above. No ischemia.  Cardiac CT-A 10/06/16: 1. Coronary calcium score of 2. This was 5 percentile for age and sex matched control. 2. Normal coronary origin with right dominance. 3. Minimal non-obstructive CAD.   Recent Labs: 02/04/2016: TSH 1.35 10/04/2016: ALT 20 10/15/2016: BUN 20; Creatinine, Ser 0.47; Hemoglobin 8.6; Platelets 203; Potassium 3.9; Sodium 140    Lipid Panel    Component Value Date/Time   CHOL 172 07/06/2016 1107   TRIG 74.0 07/06/2016  1107   HDL 55.10 07/06/2016 1107   CHOLHDL 3 07/06/2016 1107   VLDL 14.8 07/06/2016 1107   LDLCALC 102 (H) 07/06/2016 1107   LDLDIRECT 116.9 09/25/2008 1148      Wt Readings from Last 3 Encounters:  11/05/16 55.3 kg (122 lb)  10/13/16 56.7 kg (125 lb)  10/04/16 56.7 kg (125 lb)      ASSESSMENT AND PLAN:  # Non-obstructive CAD: Kathryn [name] stress test was consistent with prior MI In Kathryn inferolateral and anteroseptal regions.  However, cardiac CT-A only showed 0-25% LAD disease.   # Ascending aorta aneurysm: Kathryn [name] had a mild ascending aorta aneurysm on CT.  We will check an echo in one year.  # Hyperlipidemia:  Continue simvastatin.    Current medicines are reviewed at length with Kathryn [name] today.  Kathryn [name] does not have concerns regarding medicines.  Kathryn following  changes have been made:  no change  Labs/ tests ordered today include:   No orders of Kathryn defined types were placed in this encounter.    Disposition:   FU with Petula Rotolo C. Oval Linsey, MD, [facility] in 1 year   This note was written with Kathryn assistance of speech recognition software.  Please excuse any transcriptional errors.  Signed, Akon Reinoso C. Oval Linsey, MD, [facility]  11/05/2016 12:25 PM    [facility]

## 2016-11-05 ENCOUNTER — Ambulatory Visit (INDEPENDENT_AMBULATORY_CARE_PROVIDER_SITE_OTHER): Payer: Medicare Other | Admitting: Cardiovascular Disease

## 2016-11-05 ENCOUNTER — Encounter: Payer: Self-pay | Admitting: Cardiovascular Disease

## 2016-11-05 VITALS — BP 117/80 | HR 79 | Ht 67.0 in | Wt 122.0 lb

## 2016-11-05 DIAGNOSIS — I712 Thoracic aortic aneurysm, without rupture: Secondary | ICD-10-CM | POA: Diagnosis not present

## 2016-11-05 DIAGNOSIS — E7800 Pure hypercholesterolemia, unspecified: Secondary | ICD-10-CM

## 2016-11-05 DIAGNOSIS — I2510 Atherosclerotic heart disease of native coronary artery without angina pectoris: Secondary | ICD-10-CM

## 2016-11-05 DIAGNOSIS — I7121 Aneurysm of the ascending aorta, without rupture: Secondary | ICD-10-CM

## 2016-11-05 NOTE — Addendum Note (Signed)
Addended by: Alvina Filbert B on: 11/05/2016 03:38 PM   Modules accepted: Orders

## 2016-11-05 NOTE — Patient Instructions (Signed)
Medication Instructions:  Your physician recommends that you continue on your current medications as directed. Please refer to the Current Medication list given to you today.  Labwork: NONE  Testing/Procedures: Your physician has requested that you have an echocardiogram. Echocardiography is a painless test that uses sound waves to create images of your heart. It provides your doctor with information about the size and shape of your heart and how well your heart's chambers and valves are working. This procedure takes approximately one hour. There are no restrictions for this procedure. 1 YEAR AND FOLLOW UP OV FEW DAYS LATER  Follow-Up: Your physician wants you to follow-up in: [facility]  You will receive a reminder letter in the mail two months in advance. If you don't receive a letter, please call our office to schedule the follow-up appointment.  If you need a refill on your cardiac medications before your next appointment, please call your pharmacy.

## 2016-11-19 DIAGNOSIS — Z96641 Presence of right artificial hip joint: Secondary | ICD-10-CM | POA: Diagnosis not present

## 2016-11-19 DIAGNOSIS — Z471 Aftercare following joint replacement surgery: Secondary | ICD-10-CM | POA: Diagnosis not present

## 2016-12-21 DIAGNOSIS — Z471 Aftercare following joint replacement surgery: Secondary | ICD-10-CM | POA: Diagnosis not present

## 2016-12-21 DIAGNOSIS — Z96641 Presence of right artificial hip joint: Secondary | ICD-10-CM | POA: Diagnosis not present

## 2017-02-09 ENCOUNTER — Encounter: Payer: Self-pay | Admitting: Gynecology

## 2017-03-01 ENCOUNTER — Other Ambulatory Visit: Payer: Self-pay | Admitting: Family Medicine

## 2017-04-12 DIAGNOSIS — D485 Neoplasm of uncertain behavior of skin: Secondary | ICD-10-CM | POA: Diagnosis not present

## 2017-04-12 DIAGNOSIS — L821 Other seborrheic keratosis: Secondary | ICD-10-CM | POA: Diagnosis not present

## 2017-04-12 DIAGNOSIS — L820 Inflamed seborrheic keratosis: Secondary | ICD-10-CM | POA: Diagnosis not present

## 2017-04-13 ENCOUNTER — Other Ambulatory Visit: Payer: Self-pay | Admitting: Family Medicine

## 2017-04-13 NOTE — Telephone Encounter (Signed)
Last office visit 07/13/2016.  Diclofenac is not on current medication list.  Refill?

## 2017-04-18 ENCOUNTER — Ambulatory Visit (INDEPENDENT_AMBULATORY_CARE_PROVIDER_SITE_OTHER): Payer: Medicare Other | Admitting: Internal Medicine

## 2017-04-18 ENCOUNTER — Encounter: Payer: Self-pay | Admitting: Internal Medicine

## 2017-04-18 VITALS — BP 110/68 | HR 98 | Temp 98.1°F | Wt 129.0 lb

## 2017-04-18 DIAGNOSIS — L255 Unspecified contact dermatitis due to plants, except food: Secondary | ICD-10-CM | POA: Diagnosis not present

## 2017-04-18 DIAGNOSIS — I2510 Atherosclerotic heart disease of native coronary artery without angina pectoris: Secondary | ICD-10-CM

## 2017-04-18 MED ORDER — PREDNISONE 10 MG PO TABS: ORAL_TABLET | ORAL | 0 refills | Status: DC

## 2017-04-18 NOTE — Progress Notes (Signed)
Subjective:    Patient ID: Kathryn [name], female    DOB: 1931/09/15, 83 y.o.   MRN: [id number]  HPI  Pt presents to the clinic today with c/o a rash on her face, arms and abdomen. She first noticed this 1 week ago, after doing some yard work outside. She reports the rash is very itchy. It does not burn and is not open or draining. No one around her has had a similar rash. She has tried Calamine lotion with some relief.  Review of Systems      Past Medical History:  Diagnosis Date  . Allergy   . Arthritis   . Colon polyps   . History of blood transfusion    with previous surgeries  . Hyperlipidemia   . Irregular heart rate   . PONV (postoperative nausea and vomiting)    hx of pt. states she thinks was from pain med.    Current Outpatient Prescriptions  Medication Sig Dispense Refill  . cetirizine (ZYRTEC) 10 MG tablet TAKE 1 TABLET DAILY 90 tablet 3  . diclofenac (VOLTAREN) 75 MG EC tablet TAKE 1 TABLET TWICE A DAY 60 tablet 0  . iron polysaccharides (NIFEREX) 150 MG capsule Take 1 capsule (150 mg total) by mouth 2 (two) times daily. Take for three weeks and then discontinue the iron supplement. 42 capsule 0  . methocarbamol (ROBAXIN) 500 MG tablet Take 1 tablet (500 mg total) by mouth every 6 (six) hours as needed for muscle spasms. (Patient not taking: Reported on 11/05/2016) 80 tablet 0  . rivaroxaban (XARELTO) 10 MG TABS tablet Take 1 tablet (10 mg total) by mouth daily with breakfast. Take Xarelto for two and a half more weeks following discharge from the hospital, then discontinue Xarelto. Once the patient has completed the blood thinner regimen, then take a Baby 81 mg Aspirin daily for three more weeks. 19 tablet 0  . simvastatin (ZOCOR) 40 MG tablet TAKE 1 TABLET AT BEDTIME 90 tablet 3   No current facility-administered medications for this visit.     Allergies  Allergen Reactions  . Macrobid [Nitrofurantoin Monohyd Macro]     Made pt sick to per stomach and shaky     Family History  Problem Relation Age of Onset  . Pancreatic cancer Mother   . Hypertension Sister   . Heart disease Sister   . Breast cancer Sister        Age 47's  . Heart attack Brother   . Heart disease Brother   . Hypertension Brother   . Bone cancer Sister   . Heart disease Brother   . Colon cancer Neg Hx   . Colon polyps Neg Hx   . Esophageal cancer Neg Hx   . Diabetes Neg Hx   . Kidney disease Neg Hx   . Gallbladder disease Neg Hx     Social History   Social History  . Marital status: Married    Spouse name: N/A  . Number of children: 1  . Years of education: N/A   Occupational History  . [employer]     Works at [employer]  . Smoking status: Never Smoker  . Smokeless tobacco: Never Used  . Alcohol use 1.8 oz/week    3 Standard drinks or equivalent per week     Comment: Occassionally  . Drug use: No  . Sexual activity: No   Other Topics Concern  . Not on file   Social History  Narrative   Regular exercise-- yes, 3-4 times a week      Diet: limited water, some fruit and veggies      Full code, has living will, HCPOA, son Halei Hanover ( reviewed 2015)     Constitutional: Denies fever, malaise, fatigue, headache or abrupt weight changes.  Skin: Pt reports rash. Denies redness, lesions or ulcercations.    No other specific complaints in a complete review of systems (except as listed in HPI above).  Objective:   Physical Exam  BP 110/68   Pulse 98   Temp 98.1 F (36.7 C) (Oral)   Wt 129 lb (58.5 kg)   SpO2 98%   BMI 20.20 kg/m  Wt Readings from Last 3 Encounters:  04/18/17 129 lb (58.5 kg)  11/05/16 122 lb (55.3 kg)  10/13/16 125 lb (56.7 kg)    General: Appears her stated age, well developed, well nourished in NAD. Skin: Scattered vesicles noted on erythematous base. Noted on face, forearms, abdomen and thighs.   BMET    Component Value Date/Time   NA 140 10/15/2016 0426   K 3.9 10/15/2016  0426   CL 111 10/15/2016 0426   CO2 26 10/15/2016 0426   GLUCOSE 115 (H) 10/15/2016 0426   BUN 20 10/15/2016 0426   CREATININE 0.47 10/15/2016 0426   CREATININE 0.66 09/28/2016 1007   CALCIUM 8.6 (L) 10/15/2016 0426   GFRNONAA >60 10/15/2016 0426   GFRAA >60 10/15/2016 0426    Lipid Panel     Component Value Date/Time   CHOL 172 07/06/2016 1107   TRIG 74.0 07/06/2016 1107   HDL 55.10 07/06/2016 1107   CHOLHDL 3 07/06/2016 1107   VLDL 14.8 07/06/2016 1107   LDLCALC 102 (H) 07/06/2016 1107    CBC    Component Value Date/Time   WBC 12.0 (H) 10/15/2016 0426   RBC 2.68 (L) 10/15/2016 0426   HGB 8.6 (L) 10/15/2016 0426   HCT 25.2 (L) 10/15/2016 0426   PLT 203 10/15/2016 0426   MCV 94.0 10/15/2016 0426   MCH 32.1 10/15/2016 0426   MCHC 34.1 10/15/2016 0426   RDW 14.7 10/15/2016 0426   LYMPHSABS 0.9 04/04/2012 1123   MONOABS 0.4 04/04/2012 1123   EOSABS 0.2 04/04/2012 1123   BASOSABS 0.0 04/04/2012 1123    Hgb A1C Lab Results  Component Value Date   HGBA1C 5.7 02/04/2016            Assessment & Plan:   Contact Dermatitis due to Plant:  eRx for Pred Taper x 6 days Ok to continue [employer] prn May want to consider Oatmeal baths  Return precautions discussed Webb Silversmith, NP

## 2017-04-18 NOTE — Patient Instructions (Signed)
Poison Ivy Dermatitis Poison ivy dermatitis is redness and soreness (inflammation) of the skin. It is caused by a chemical that is found on the leaves of the poison ivy plant. You may also have itching, a rash, and blisters. Symptoms often clear up in 1-2 weeks. You may get this condition by touching a poison ivy plant. You can also get it by touching something that has the chemical on it. This may include animals or objects that have come in contact with the plant. Follow these instructions at home: General instructions  Take or apply over-the-counter and prescription medicines only as told by your doctor.  If you touch poison ivy, wash your skin with soap and cold water right away.  Use hydrocortisone creams or calamine lotion as needed to help with itching.  Take oatmeal baths as needed. Use colloidal oatmeal. You can get this at a pharmacy or grocery store. Follow the instructions on the package.  Do not scratch or rub your skin.  While you have the rash, wash your clothes right after you wear them. Prevention  Know what poison ivy looks like so you can avoid it. This plant has three leaves with flowering branches on a single stem. The leaves are glossy. They have uneven edges that come to a point at the front.  If you have touched poison ivy, wash with soap and water right away. Be sure to wash under your fingernails.  When hiking or camping, wear long pants, a long-sleeved shirt, tall socks, and hiking boots. You can also use a lotion on your skin that helps to prevent contact with the chemical on the plant.  If you think that your clothes or outdoor gear came in contact with poison ivy, rinse them off with a garden hose before you bring them inside your house. Contact a doctor if:  You have open sores in the rash area.  You have more redness, swelling, or pain in the affected area.  You have redness that spreads beyond the rash area.  You have fluid, blood, or pus coming from  the affected area.  You have a fever.  You have a rash over a large area of your body.  You have a rash on your eyes, mouth, or genitals.  Your rash does not get better after a few days. Get help right away if:  Your face swells or your eyes swell shut.  You have trouble breathing.  You have trouble swallowing. This information is not intended to replace advice given to you by your health care provider. Make sure you discuss any questions you have with your health care provider. Document Released: 10/16/2010 Document Revised: 02/19/2016 Document Reviewed: 02/19/2015 Elsevier Interactive Patient Education  2018 [employer]  

## 2017-06-02 ENCOUNTER — Emergency Department (HOSPITAL_COMMUNITY): Payer: Medicare Other

## 2017-06-02 ENCOUNTER — Encounter (HOSPITAL_COMMUNITY): Payer: Self-pay | Admitting: Emergency Medicine

## 2017-06-02 DIAGNOSIS — Y9389 Activity, other specified: Secondary | ICD-10-CM | POA: Insufficient documentation

## 2017-06-02 DIAGNOSIS — S40012A Contusion of left shoulder, initial encounter: Secondary | ICD-10-CM | POA: Diagnosis not present

## 2017-06-02 DIAGNOSIS — M25512 Pain in left shoulder: Secondary | ICD-10-CM | POA: Insufficient documentation

## 2017-06-02 DIAGNOSIS — Z96643 Presence of artificial hip joint, bilateral: Secondary | ICD-10-CM | POA: Diagnosis not present

## 2017-06-02 DIAGNOSIS — Y998 Other external cause status: Secondary | ICD-10-CM | POA: Insufficient documentation

## 2017-06-02 DIAGNOSIS — W010XXA Fall on same level from slipping, tripping and stumbling without subsequent striking against object, initial encounter: Secondary | ICD-10-CM | POA: Diagnosis not present

## 2017-06-02 DIAGNOSIS — Y929 Unspecified place or not applicable: Secondary | ICD-10-CM | POA: Insufficient documentation

## 2017-06-02 DIAGNOSIS — Z96651 Presence of right artificial knee joint: Secondary | ICD-10-CM | POA: Insufficient documentation

## 2017-06-02 DIAGNOSIS — Z79899 Other long term (current) drug therapy: Secondary | ICD-10-CM | POA: Insufficient documentation

## 2017-06-02 NOTE — ED Triage Notes (Signed)
Pt experienced a mechanical fall when her shoe caught on "the cement".  Her left shoulder/arm hurts when she tries to raise her arm.  There is some bruising on her left wrist as well.  No dizziness, LOC or other complaints. Pt is not on blood thinners.

## 2017-06-03 ENCOUNTER — Emergency Department (HOSPITAL_COMMUNITY)
Admission: EM | Admit: 2017-06-03 | Discharge: 2017-06-03 | Disposition: A | Discharge status: Home / Self Care | Payer: Medicare Other | Attending: Emergency Medicine | Admitting: Emergency Medicine

## 2017-06-03 ENCOUNTER — Encounter (HOSPITAL_COMMUNITY): Payer: Self-pay | Admitting: Emergency Medicine

## 2017-06-03 DIAGNOSIS — M25512 Pain in left shoulder: Secondary | ICD-10-CM

## 2017-06-03 NOTE — ED Provider Notes (Signed)
[facility] DEPT Provider Note   CSN: [id number] Arrival date & time: 06/02/17  2054     History   Chief Complaint Chief Complaint  Patient presents with  . Fall    HPI Kathryn [name] is a 83 y.o. female.  83 yo F with a chief complaint of a mechanical fall. Patient landed on her left shoulder. She denies head injury or neck pain. Denies back pain abdominal pain or lower extremity pain. She initially had no symptoms and then realized when she try to raise her arm up above about 90 had pain. Described as sharp and shooting. Denies radiation.   The history is provided by the patient.  Fall  This is a new problem. The current episode started 6 to 12 hours ago. The problem occurs rarely. The problem has been resolved. Pertinent negatives include no chest pain, no headaches and no shortness of breath. Nothing aggravates the symptoms. Nothing relieves the symptoms. She has tried nothing for the symptoms. The treatment provided no relief.    Past Medical History:  Diagnosis Date  . Allergy   . Arthritis   . Colon polyps   . History of blood transfusion    with previous surgeries  . Hyperlipidemia   . Irregular heart rate   . PONV (postoperative nausea and vomiting)    hx of pt. states she thinks was from pain med.    Patient Active Problem List   Diagnosis Date Noted  . OA (osteoarthritis) of hip 10/13/2016  . First degree AV block 07/13/2016  . Counseling regarding end of life decision making 07/08/2015  . Irregular heart rate 04/09/2014  . Elevated cholesterol   . Pre-op evaluation 02/02/2011  . B12 deficiency 09/26/2008  . LEG CRAMPS, NOCTURNAL 09/25/2008  . URINARY TRACT INFECTION, RECURRENT 07/11/2008  . Osteoporosis 06/28/2007  . ALLERGIC RHINITIS 05/31/2007  . OSTEOARTHRITIS 05/31/2007    Past Surgical History:  Procedure Laterality Date  . CARDIAC CATHETERIZATION    . HAND SURGERY Left   . TOTAL HIP ARTHROPLASTY  2007   LEFT  . TOTAL HIP ARTHROPLASTY  Right 10/13/2016   Procedure: RIGHT TOTAL HIP ARTHROPLASTY ANTERIOR APPROACH;  Surgeon: Gaynelle Arabian, MD;  Location: [facility];  Service: Orthopedics;  Laterality: Right;  . TOTAL KNEE ARTHROPLASTY     Right   . TUBAL LIGATION      OB History    Gravida Para Term Preterm AB Living   1 1 1     1    SAB TAB Ectopic Multiple Live Births                   Home Medications    Prior to Admission medications   Medication Sig Start Date End Date Taking? Authorizing Provider  cetirizine (ZYRTEC) 10 MG tablet TAKE 1 TABLET DAILY 03/01/17   Bedsole, Amy E, MD  diclofenac (VOLTAREN) 75 MG EC tablet TAKE 1 TABLET TWICE A DAY 04/14/17   Bedsole, Amy E, MD  predniSONE (DELTASONE) 10 MG tablet Take 3 tabs on days 1-2, take 2 tabs on days 3-4, take 1 tab on days 5-6 04/18/17   Jearld Fenton, NP  simvastatin (ZOCOR) 40 MG tablet TAKE 1 TABLET AT BEDTIME 08/15/16   Jinny Sanders, MD    Family History Family History  Problem Relation Age of Onset  . Pancreatic cancer Mother   . Hypertension Sister   . Heart disease Sister   . Breast cancer Sister  Age 92's  . Heart attack Brother   . Heart disease Brother   . Hypertension Brother   . Bone cancer Sister   . Heart disease Brother   . Colon cancer Neg Hx   . Colon polyps Neg Hx   . Esophageal cancer Neg Hx   . Diabetes Neg Hx   . Kidney disease Neg Hx   . Gallbladder disease Neg Hx     Social History Social History  Substance Use Topics  . Smoking status: Never Smoker  . Smokeless tobacco: Never Used  . Alcohol use 1.8 oz/week    3 Standard drinks or equivalent per week     Comment: Occassionally     Allergies   Macrobid [nitrofurantoin monohyd macro]   Review of Systems Review of Systems  Constitutional: Negative for chills and fever.  HENT: Negative for congestion and rhinorrhea.   Eyes: Negative for redness and visual disturbance.  Respiratory: Negative for shortness of breath and wheezing.   Cardiovascular: Negative  for chest pain and palpitations.  Gastrointestinal: Negative for nausea and vomiting.  Genitourinary: Negative for dysuria and urgency.  Musculoskeletal: Positive for arthralgias and myalgias.  Skin: Negative for pallor and wound.  Neurological: Negative for dizziness and headaches.     Physical Exam Updated Vital Signs BP 133/83 (BP Location: Left Arm)   Pulse 72   Temp 98.3 F (36.8 C) (Oral)   Resp 18   Ht 5' 7.75" (1.721 m)   Wt 59 kg (130 lb)   SpO2 100%   BMI 19.91 kg/m   Physical Exam  Constitutional: She is oriented to person, place, and time. She appears well-developed and well-nourished. No distress.  HENT:  Head: Normocephalic and atraumatic.  Eyes: Pupils are equal, round, and reactive to light. EOM are normal.  Neck: Normal range of motion. Neck supple.  Cardiovascular: Normal rate and regular rhythm.  Exam reveals no gallop and no friction rub.   No murmur heard. Pulmonary/Chest: Effort normal. She has no wheezes. She has no rales.  Abdominal: Soft. She exhibits no distension and no mass. There is no tenderness. There is no rebound and no guarding.  Musculoskeletal: She exhibits tenderness. She exhibits no edema.  Small bruise to the left lateral aspect of the shoulder. Patient has pain when she forward flexes to 90. Pulse motor and sensation is intact distally.  Neurological: She is alert and oriented to person, place, and time.  Skin: Skin is warm and dry. She is not diaphoretic.  Psychiatric: She has a normal mood and affect. Her behavior is normal.  Nursing note and vitals reviewed.    ED Treatments / Results  Labs (all labs ordered are listed, but only abnormal results are displayed) Labs Reviewed - No data to display  EKG  EKG Interpretation None       Radiology Dg Shoulder Left  Result Date: 06/02/2017 CLINICAL DATA:  Slip and fall injury. Bruise on the left shoulder. Pain when raising the arm. EXAM: LEFT SHOULDER - 2+ VIEW COMPARISON:   None. FINDINGS: Vague cortical irregularities suggested in the inferior glenoid region. This could represent hypertrophic changes but a nondisplaced or impacted fracture is not excluded. Humeral head appears intact. No glenohumeral dislocation. Acromioclavicular and coracoclavicular spaces are maintained. No focal bone lesions. Soft tissues are unremarkable. IMPRESSION: Suggestion of a nondisplaced or impacted fracture of the inferior glenoid. Electronically Signed   By: Lucienne Capers M.D.   On: 06/02/2017 21:49    Procedures Procedures (including critical care  time)  Medications Ordered in ED Medications - No data to display   Initial Impression / Assessment and Plan / ED Course  I have reviewed the triage vital signs and the nursing notes.  Pertinent labs & imaging results that were available during my care of the patient were reviewed by me and considered in my medical decision making (see chart for details).     83 yo F With a chief complaints of left shoulder pain. X-ray with a possible chip fracture of the glenoid. We'll place in a sling. Have her follow-up with her orthopedist.  1:39 AM:  I have discussed the diagnosis/risks/treatment options with the patient and family and believe the pt to be eligible for discharge home to follow-up with Ortho. We also discussed returning to the ED immediately if new or worsening sx occur. We discussed the sx which are most concerning (e.g., sudden worsening pain, fever, inability to tolerate by mouth) that necessitate immediate return. Medications administered to the patient during their visit and any new prescriptions provided to the patient are listed below.  Medications given during this visit Medications - No data to display   The patient appears reasonably screen and/or stabilized for discharge and I doubt any other medical condition or other [facility] requiring further screening, evaluation, or treatment in the ED at this time prior to  discharge.    Final Clinical Impressions(s) / ED Diagnoses   Final diagnoses:  Acute pain of left shoulder    New Prescriptions New Prescriptions   No medications on file     Deno Etienne, DO 06/03/17 0139

## 2017-06-08 DIAGNOSIS — S42152A Displaced fracture of neck of scapula, left shoulder, initial encounter for closed fracture: Secondary | ICD-10-CM | POA: Diagnosis not present

## 2017-06-08 DIAGNOSIS — S42142A Displaced fracture of glenoid cavity of scapula, left shoulder, initial encounter for closed fracture: Secondary | ICD-10-CM | POA: Diagnosis not present

## 2017-06-21 DIAGNOSIS — S42152D Displaced fracture of neck of scapula, left shoulder, subsequent encounter for fracture with routine healing: Secondary | ICD-10-CM | POA: Diagnosis not present

## 2017-06-21 DIAGNOSIS — S42142D Displaced fracture of glenoid cavity of scapula, left shoulder, subsequent encounter for fracture with routine healing: Secondary | ICD-10-CM | POA: Diagnosis not present

## 2017-07-05 ENCOUNTER — Other Ambulatory Visit: Payer: Self-pay | Admitting: Family Medicine

## 2017-07-05 NOTE — Telephone Encounter (Signed)
Last office visit 04/18/2017 with R. Garnette Gunner for contact dermatitis.  Last refilled 04/14/2017 for #60 with no refills.  Ok to refill?

## 2017-08-05 ENCOUNTER — Ambulatory Visit (INDEPENDENT_AMBULATORY_CARE_PROVIDER_SITE_OTHER): Payer: Medicare Other

## 2017-08-05 ENCOUNTER — Other Ambulatory Visit: Payer: Self-pay

## 2017-08-05 DIAGNOSIS — Z23 Encounter for immunization: Secondary | ICD-10-CM | POA: Diagnosis not present

## 2017-08-05 MED ORDER — DICLOFENAC SODIUM 75 MG PO TBEC: 75.0000 mg | DELAYED_RELEASE_TABLET | Freq: Two times a day (BID) | ORAL | 0 refills | Status: DC

## 2017-08-09 DIAGNOSIS — Z1231 Encounter for screening mammogram for malignant neoplasm of breast: Secondary | ICD-10-CM | POA: Diagnosis not present

## 2017-08-09 DIAGNOSIS — Z803 Family history of malignant neoplasm of breast: Secondary | ICD-10-CM | POA: Diagnosis not present

## 2017-08-10 ENCOUNTER — Other Ambulatory Visit: Payer: Self-pay | Admitting: Family Medicine

## 2017-10-03 ENCOUNTER — Telehealth: Payer: Self-pay

## 2017-10-03 NOTE — Telephone Encounter (Signed)
Attempted to reach patient. Attempt unsuccessful. Left message on mobile phone.   Requested patient fast for labs 4 hrs prior to visit, bring a copy of advanced directives, and to wear shoes that can easily be taken off for height and weight purposes.

## 2017-10-04 ENCOUNTER — Ambulatory Visit (INDEPENDENT_AMBULATORY_CARE_PROVIDER_SITE_OTHER): Payer: Medicare Other

## 2017-10-04 ENCOUNTER — Telehealth: Payer: Self-pay | Admitting: Family Medicine

## 2017-10-04 VITALS — BP 102/78 | HR 66 | Temp 97.8°F | Ht 67.0 in | Wt 130.0 lb

## 2017-10-04 DIAGNOSIS — E538 Deficiency of other specified B group vitamins: Secondary | ICD-10-CM

## 2017-10-04 DIAGNOSIS — E7800 Pure hypercholesterolemia, unspecified: Secondary | ICD-10-CM | POA: Diagnosis not present

## 2017-10-04 DIAGNOSIS — M810 Age-related osteoporosis without current pathological fracture: Secondary | ICD-10-CM

## 2017-10-04 DIAGNOSIS — Z0000 Encounter for general adult medical examination without abnormal findings: Secondary | ICD-10-CM

## 2017-10-04 LAB — VITAMIN B12: Vitamin B-12: 283 pg/mL (ref 211–911)

## 2017-10-04 LAB — LIPID PANEL
CHOL/HDL RATIO: 3
Cholesterol: 156 mg/dL (ref 0–200)
HDL: 57.9 mg/dL (ref >39.00)
LDL Cholesterol: 85 mg/dL (ref 0–99)
NONHDL: 98.53
Triglycerides: 67 mg/dL (ref 0.0–149.0)
VLDL: 13.4 mg/dL (ref 0.0–40.0)

## 2017-10-04 LAB — VITAMIN D 25 HYDROXY (VIT D DEFICIENCY, FRACTURES): VITD: 50.92 ng/mL (ref 30.00–100.00)

## 2017-10-04 NOTE — Telephone Encounter (Signed)
-----   Message from Eustace Pen, LPN sent at 03/31/1699  3:18 PM EST ----- Regarding: Labs 1/8 Lab orders needed. Thank you.  Insurance:  [employer]

## 2017-10-04 NOTE — Progress Notes (Signed)
Subjective:   Kathryn PROPHETE is a 84 y.o. female who presents for Medicare Annual (Subsequent) preventive examination.  Review of Systems:  N/A Cardiac Risk Factors include: advanced age (>14men, >17 women)     Objective:     Vitals: BP 102/78 (BP Location: Right Arm, Patient Position: Sitting, Cuff Size: Normal)   Pulse 66   Temp 97.8 F (36.6 C) (Oral)   Ht 5\' 7"  (1.702 m) Comment: no shoes  Wt 130 lb (59 kg)   SpO2 98%   BMI 20.36 kg/m   Body mass index is 20.36 kg/m.  Advanced Directives 10/04/2017 10/13/2016 10/04/2016 07/13/2016  Does Patient Have a Medical Advance Directive? Yes Yes Yes Yes  Type of Paramedic of [place];Living will [place];Living will [place];Living will [place];Living will  Does patient want to make changes to medical advance directive? - No - Patient declined - No - Patient declined  Copy of [place] in Chart? No - copy requested No - copy requested No - copy requested No - copy requested    Tobacco Social History   Tobacco Use  Smoking Status Never Smoker  Smokeless Tobacco Never Used     Counseling given: No   Clinical Intake:  Pre-visit preparation completed: Yes  Pain : No/denies pain Pain Score: 0-No pain     Nutritional Status: BMI of 19-24  Normal Nutritional Risks: None Diabetes: No  How often do you need to have someone help you when you read instructions, pamphlets, or other written materials from your doctor or pharmacy?: 1 - Never What is the last grade level you completed in school?: 12th grade   Interpreter Needed?: No  Comments: pt lives with spouse Information entered by :: LPinson, LPN  Past Medical History:  Diagnosis Date  . Allergy   . Arthritis   . Colon polyps   . History of blood transfusion    with previous surgeries  . Hyperlipidemia   . Irregular heart rate   . PONV (postoperative nausea and  vomiting)    hx of pt. states she thinks was from pain med.   Past Surgical History:  Procedure Laterality Date  . CARDIAC CATHETERIZATION    . HAND SURGERY Left   . TOTAL HIP ARTHROPLASTY  2007   LEFT  . TOTAL HIP ARTHROPLASTY Right 10/13/2016   Procedure: RIGHT TOTAL HIP ARTHROPLASTY ANTERIOR APPROACH;  Surgeon: Gaynelle Arabian, MD;  Location: [facility];  Service: Orthopedics;  Laterality: Right;  . TOTAL KNEE ARTHROPLASTY     Right   . TUBAL LIGATION     Family History  Problem Relation Age of Onset  . Pancreatic cancer Mother   . Hypertension Sister   . Heart disease Sister   . Breast cancer Sister        Age 58's  . Heart attack Brother   . Heart disease Brother   . Hypertension Brother   . Bone cancer Sister   . Heart disease Brother   . Colon cancer Neg Hx   . Colon polyps Neg Hx   . Esophageal cancer Neg Hx   . Diabetes Neg Hx   . Kidney disease Neg Hx   . Gallbladder disease Neg Hx    Social History   Socioeconomic History  . Marital status: Married    Spouse name: None  . Number of children: 1  . Years of education: None  . Highest education level: None  Social Needs  . Financial resource strain: None  . Food insecurity - worry: None  . Food insecurity - inability: None  . Transportation needs - medical: None  . Transportation needs - non-medical: None  Occupational History  . Occupation: Chartered loss adjuster    Comment: Works at [employer]  . Smoking status: Never Smoker  . Smokeless tobacco: Never Used  Substance and Sexual Activity  . Alcohol use: Yes    Alcohol/week: 1.8 oz    Types: 3 Standard drinks or equivalent per week    Comment: Occassionally  . Drug use: No  . Sexual activity: No    Birth control/protection: Post-menopausal, Surgical  Other Topics Concern  . None  Social History Narrative   Regular exercise-- yes, 3-4 times a week      Diet: limited water, some fruit and veggies      Full code, has living will, HCPOA, son  Jennae Hakeem ( reviewed 2015)    Outpatient Encounter Medications as of 10/04/2017  Medication Sig  . cetirizine (ZYRTEC) 10 MG tablet TAKE 1 TABLET DAILY  . diclofenac (VOLTAREN) 75 MG EC tablet Take 1 tablet (75 mg total) 2 (two) times daily by mouth.  . simvastatin (ZOCOR) 40 MG tablet TAKE 1 TABLET AT BEDTIME  . [DISCONTINUED] predniSONE (DELTASONE) 10 MG tablet Take 3 tabs on days 1-2, take 2 tabs on days 3-4, take 1 tab on days 5-6   No facility-administered encounter medications on file as of 10/04/2017.     Activities of Daily Living In your present state of health, do you have any difficulty performing the following activities: 10/04/2017 10/13/2016  Hearing? N N  Vision? N N  Difficulty concentrating or making decisions? N N  Walking or climbing stairs? N Y  Dressing or bathing? N N  Doing errands, shopping? N N  Preparing Food and eating ? N -  Using the Toilet? N -  In the past six months, have you accidently leaked urine? N -  Do you have problems with loss of bowel control? N -  Managing your Medications? N -  Managing your Finances? N -  Housekeeping or managing your Housekeeping? N -  Some recent data might be hidden    Patient Care Team: Jinny Sanders, MD as PCP - General    Assessment:   This is a routine wellness examination for Avangelina.   Hearing Screening   125Hz  250Hz  500Hz  1000Hz  2000Hz  3000Hz  4000Hz  6000Hz  8000Hz   Right ear:   40 0 40  0    Left ear:   40 40 40  0    Vision Screening Comments: Last vision exam in Nov 2018 with [facility]     Exercise Activities and Dietary recommendations Current Exercise Habits: Home exercise routine, Type of exercise: walking, Time (Minutes): 40, Frequency (Times/Week): 2, Weekly Exercise (Minutes/Week): 80, Intensity: Moderate, Exercise limited by: None identified  Goals    . Increase physical activity     Starting 10/04/2017, I will continue to walk for 40 minutes twice weekly and to continue walking on my job  at [employer]        Fall Risk Fall Risk  10/04/2017 07/13/2016 07/08/2015 04/09/2014  Falls in the past year? No No No No   Depression Screen PHQ 2/9 Scores 10/04/2017 07/13/2016 07/08/2015 04/09/2014  PHQ - 2 Score 0 0 0 0  PHQ- 9 Score 0 - - -     Cognitive Function MMSE - Mini Mental State Exam  10/04/2017  Orientation to time 5  Orientation to Place 5  Registration 3  Attention/ Calculation 0  Recall 3  Language- name 2 objects 0  Language- repeat 1  Language- follow 3 step command 3  Language- read & follow direction 0  Write a sentence 0  Copy design 0  Total score 20       PLEASE NOTE: A Mini-Cog screen was completed. Maximum score is 20. A value of 0 denotes this part of [place]stein MMSE was not completed or the patient failed this part of the Mini-Cog screening.   Mini-Cog Screening Orientation to Time - Max 5 pts Orientation to Place - Max 5 pts Registration - Max 3 pts Recall - Max 3 pts Language Repeat - Max 1 pts Language Follow 3 Step Command - Max 3 pts   Immunization History  Administered Date(s) Administered  . Influenza Split 08/29/2012  . Influenza Whole 06/27/2006, 07/14/2007, 06/18/2008, 08/05/2009, 06/23/2010  . Influenza,inj,Quad PF,6+ Mos 08/14/2013, 07/18/2014, 07/08/2015, 07/13/2016, 08/05/2017  . Pneumococcal Conjugate-13 04/09/2014  . Pneumococcal Polysaccharide-23 09/27/2005  . Td 09/27/2004  . Zoster 06/18/2013    Screening Tests Health Maintenance  Topic Date Due  . TETANUS/TDAP  10/04/2018 (Originally 09/27/2014)  . MAMMOGRAM  02/06/2018  . INFLUENZA VACCINE  Completed  . DEXA SCAN  Completed  . PNA vac Low Risk Adult  Completed       Plan:     I have personally reviewed, addressed, and noted the following in the patient's chart:  A. Medical and social history B. Use of alcohol, tobacco or illicit drugs  C. Current medications and supplements D. Functional ability and status E.  Nutritional status F.  Physical  activity G. Advance directives H. List of other physicians I.  Hospitalizations, surgeries, and ER visits in previous 12 months J.  [place] to include hearing, vision, cognitive, depression L. Referrals and appointments - none  In addition, I have reviewed and discussed with patient certain preventive protocols, quality metrics, and best practice recommendations. A written personalized care plan for preventive services as well as general preventive health recommendations were provided to patient.  See attached scanned questionnaire for additional information.   Signed,   Lindell Noe, MHA, BS, LPN Health Coach

## 2017-10-04 NOTE — Patient Instructions (Addendum)
Kathryn [name] , Thank you for taking time to come for your Medicare Wellness Visit. I appreciate your ongoing commitment to your health goals. Please review the following plan we discussed and let me know if I can assist you in the future.   These are the goals we discussed: Goals    . Increase physical activity     Starting 10/04/2017, I will continue to walk for 40 minutes twice weekly and to continue walking on my job at [employer]        This is a list of the screening recommended for you and due dates:  Health Maintenance  Topic Date Due  . Tetanus Vaccine  10/04/2018*  . Mammogram  02/06/2018  . Flu Shot  Completed  . DEXA scan (bone density measurement)  Completed  . Pneumonia vaccines  Completed  *Topic was postponed. The date shown is not the original due date.   Preventive Care for Adults  A healthy lifestyle and preventive care can promote health and wellness. Preventive health guidelines for adults include the following key practices.  . A routine yearly physical is a good way to check with your health care provider about your health and preventive screening. It is a chance to share any concerns and updates on your health and to receive a thorough exam.  . Visit your dentist for a routine exam and preventive care every 6 months. Brush your teeth twice a day and floss once a day. Good oral hygiene prevents tooth decay and gum disease.  . The frequency of eye exams is based on your age, health, family medical history, use  of contact lenses, and other factors. Follow your health care provider's recommendations for frequency of eye exams.  . Eat a healthy diet. Foods like vegetables, fruits, whole grains, low-fat dairy products, and lean protein foods contain the nutrients you need without too many calories. Decrease your intake of foods high in solid fats, added sugars, and salt. Eat the right amount of calories for you. Get information about a proper diet from your health care  provider, if necessary.  . Regular physical exercise is one of the most important things you can do for your health. Most adults should get at least 150 minutes of moderate-intensity exercise (any activity that increases your heart rate and causes you to sweat) each week. In addition, most adults need muscle-strengthening exercises on 2 or more days a week.  Silver Sneakers may be a benefit available to you. To determine eligibility, you may visit the website: www.silversneakers.com or contact program at (220) 602-4920 Mon-Fri between 8AM-8PM.   . Maintain a healthy weight. The body mass index (BMI) is a screening tool to identify possible weight problems. It provides an estimate of body fat based on height and weight. Your health care provider can find your BMI and can help you achieve or maintain a healthy weight.   For adults 20 years and older: ? A BMI below 18.5 is considered underweight. ? A BMI of 18.5 to 24.9 is normal. ? A BMI of 25 to 29.9 is considered overweight. ? A BMI of 30 and above is considered obese.   . Maintain normal blood lipids and cholesterol levels by exercising and minimizing your intake of saturated fat. Eat a balanced diet with plenty of fruit and vegetables. Blood tests for lipids and cholesterol should begin at age 51 and be repeated every 5 years. If your lipid or cholesterol levels are high, you are over 50, or you are  at high risk for heart disease, you may need your cholesterol levels checked more frequently. Ongoing high lipid and cholesterol levels should be treated with medicines if diet and exercise are not working.  . If you smoke, find out from your health care provider how to quit. If you do not use tobacco, please do not start.  . If you choose to drink alcohol, please do not consume more than 2 drinks per day. One drink is considered to be 12 ounces (355 mL) of beer, 5 ounces (148 mL) of wine, or 1.5 ounces (44 mL) of liquor.  . If you are 24-79 years  old, ask your health care provider if you should take aspirin to prevent strokes.  . Use sunscreen. Apply sunscreen liberally and repeatedly throughout the day. You should seek shade when your shadow is shorter than you. Protect yourself by wearing long sleeves, pants, a wide-brimmed hat, and sunglasses year round, whenever you are outdoors.  . Once a month, do a whole body skin exam, using a mirror to look at the skin on your back. Tell your health care provider of new moles, moles that have irregular borders, moles that are larger than a pencil eraser, or moles that have changed in shape or color.

## 2017-10-04 NOTE — Progress Notes (Signed)
I reviewed health advisor's note, was available for consultation, and agree with documentation and plan.  

## 2017-10-04 NOTE — Progress Notes (Signed)
PCP notes:   Health maintenance:  Tetanus - postponed/insurance  Abnormal screenings:   Hearing - failed  Hearing Screening   125Hz  250Hz  500Hz  1000Hz  2000Hz  3000Hz  4000Hz  6000Hz  8000Hz   Right ear:   40 0 40  0    Left ear:   40 40 40  0     Patient concerns:   None  Nurse concerns:  None  Next PCP appt:   10/11/17 @ 1400

## 2017-10-04 NOTE — Progress Notes (Signed)
Pre visit review using our clinic review tool, if applicable. No additional management support is needed unless otherwise documented below in the visit note. 

## 2017-10-11 ENCOUNTER — Encounter: Payer: Self-pay | Admitting: Family Medicine

## 2017-10-11 ENCOUNTER — Ambulatory Visit (INDEPENDENT_AMBULATORY_CARE_PROVIDER_SITE_OTHER): Payer: Medicare Other | Admitting: Family Medicine

## 2017-10-11 ENCOUNTER — Other Ambulatory Visit: Payer: Self-pay

## 2017-10-11 VITALS — BP 90/60 | HR 88 | Temp 97.4°F | Ht 67.0 in | Wt 131.8 lb

## 2017-10-11 DIAGNOSIS — E7800 Pure hypercholesterolemia, unspecified: Secondary | ICD-10-CM | POA: Diagnosis not present

## 2017-10-11 DIAGNOSIS — M810 Age-related osteoporosis without current pathological fracture: Secondary | ICD-10-CM

## 2017-10-11 DIAGNOSIS — E538 Deficiency of other specified B group vitamins: Secondary | ICD-10-CM

## 2017-10-11 NOTE — Patient Instructions (Addendum)
Keep up healthy lifestyle. 

## 2017-10-11 NOTE — Progress Notes (Signed)
Subjective:    Patient ID: Kathryn [name], female    DOB: 20-Oct-1930, 84 y.o.   MRN: [id number]  HPI   The patient presents for complete physical and review of chronic health problems.    She is doing well overall.  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  The patient saw Candis Musa, LPN for medicare wellness. Note reviewed in detail and important notes copied below.  Health maintenance:  Tetanus - postponed/insurance Abnormal screenings:  Hearing - failed             Hearing Screening   125Hz  250Hz  500Hz  1000Hz  2000Hz  3000Hz  4000Hz  6000Hz  8000Hz   Right ear:   40 0 40  0    Left ear:   40 40 40  0    Patient concerns:  None   10/11/17  Today:  Elevated Cholesterol:  Good control on  No med. Lab Results  Component Value Date   CHOL 156 10/04/2017   HDL 57.90 10/04/2017   LDLCALC 85 10/04/2017   LDLDIRECT 116.9 09/25/2008   TRIG 67.0 10/04/2017   CHOLHDL 3 10/04/2017  Using medications without problems: Muscle aches:  Diet compliance: Exercise: Other complaints:  Osteopenia, worsened last check: on no med, previously on boniva  Refused further treatment.  VIt B12 : nml  Vi D: nml  Social History /Family History/Past Medical History reviewed in detail and updated in EMR if needed. Blood pressure 90/60, pulse 88, temperature (!) 97.4 F (36.3 C), temperature source Oral, height 5\' 7"  (1.702 m), weight 131 lb 12 oz (59.8 kg).   Review of Systems  Constitutional: Negative for fatigue and fever.  HENT: Negative for congestion.   Eyes: Negative for pain.  Respiratory: Negative for cough and shortness of breath.   Cardiovascular: Negative for chest pain, palpitations and leg swelling.  Gastrointestinal: Negative for abdominal pain.  Genitourinary: Negative for dysuria and vaginal bleeding.       Nocturia  Musculoskeletal: Negative for back pain.  Neurological: Negative for syncope, light-headedness and headaches.    Psychiatric/Behavioral: Negative for dysphoric mood.       Objective:   Physical Exam  Constitutional: Vital signs are normal. She appears well-developed and well-nourished. She is cooperative.  Non-toxic appearance. She does not appear ill. No distress.  Thin appearing female in NAD  HENT:  Head: Normocephalic.  Right Ear: Hearing, tympanic membrane, external ear and ear canal normal.  Left Ear: Hearing, tympanic membrane, external ear and ear canal normal.  Nose: Nose normal.  Eyes: Conjunctivae, EOM and lids are normal. Pupils are equal, round, and reactive to light. Lids are everted and swept, no foreign bodies found.  Neck: Trachea normal and normal range of motion. Neck supple. Carotid bruit is not present. No thyroid mass and no thyromegaly present.  Cardiovascular: Normal rate, regular rhythm, S1 normal, S2 normal, normal heart sounds and intact distal pulses. Exam reveals no gallop.  No murmur heard. Pulmonary/Chest: Effort normal and breath sounds normal. No respiratory distress. She has no wheezes. She has no rhonchi. She has no rales.  Abdominal: Soft. Normal appearance and bowel sounds are normal. She exhibits no distension, no fluid wave, no abdominal bruit and no mass. There is no hepatosplenomegaly. There is no tenderness. There is no rebound, no guarding and no CVA tenderness. No hernia.  Musculoskeletal:       Right knee: She exhibits decreased range of motion.       Left knee: She exhibits decreased range of motion and deformity.  Lymphadenopathy:    She has no cervical adenopathy.    She has no axillary adenopathy.  Neurological: She is alert. She has normal strength. No cranial nerve deficit or sensory deficit.  Skin: Skin is warm, dry and intact. No rash noted.  Psychiatric: Her speech is normal and behavior is normal. Judgment normal. Her mood appears not anxious. Cognition and memory are normal. She does not exhibit a depressed mood.          Assessment &  Plan:  The patient's preventative maintenance and recommended screening tests for an annual wellness exam were reviewed in full today. Brought up to date unless services declined.  Counselled on the importance of diet, exercise, and its role in overall health and mortality. The patient's FH and SH was reviewed, including their home life, tobacco status, and drug and alcohol status.   Nonsmoker  Vaccines Uptodate flu, PNA , refused td. Colon: Dr. Henrene Pastor, 2 polyps 09/2008,no further indicated due to age. DEXA:  2017 osteopenia in spine, osteoporosis in forearm.. Pt refuses medication to treat. Mammogram: 07/2017 nml, no further indicated, she request to continue given healthy. DVE/PAP: no indication due to age

## 2017-10-18 NOTE — Addendum Note (Signed)
Addended byEliezer Lofts E on: 10/18/2017 01:51 PM   Modules accepted: Level of Service

## 2017-10-18 NOTE — Assessment & Plan Note (Signed)
Resolved

## 2017-10-18 NOTE — Assessment & Plan Note (Signed)
2017 osteopenia in spine, osteoporosis in forearm.. Pt refuses medication to treat.

## 2017-11-10 ENCOUNTER — Other Ambulatory Visit: Payer: Self-pay | Admitting: Family Medicine

## 2017-11-10 NOTE — Telephone Encounter (Signed)
Last Rx11/2019 #180. Last OV 09/2017

## 2017-11-10 NOTE — Telephone Encounter (Signed)
Copied from [place] [id number]. Topic: Quick Communication - Rx Refill/Question >> Nov 10, 2017 10:37 AM Ahmed Prima L wrote: Medication:  diclofenac (VOLTAREN) 75 MG EC tablet  Has the patient contacted their pharmacy? No, she said the office calls this one in    (Agent: If no, request that the patient contact the pharmacy for the refill.)   Preferred Pharmacy (with phone number or street name): [place], [place]   Agent: Please be advised that RX refills may take up to 3 business days. We ask that you follow-up with your pharmacy.

## 2017-11-10 NOTE — Telephone Encounter (Signed)
Voltaren refill Last OV: unable to find where Voltaren was addressed Last Refill:08/05/17 #180 tabs 0 RF Pharmacy:[facility]

## 2017-11-11 MED ORDER — DICLOFENAC SODIUM 75 MG PO TBEC: 75.0000 mg | DELAYED_RELEASE_TABLET | Freq: Two times a day (BID) | ORAL | 0 refills | Status: DC

## 2017-11-11 NOTE — Telephone Encounter (Signed)
Refill sent to [facility] as instructed by Dr. Diona Browner.

## 2017-11-11 NOTE — Telephone Encounter (Signed)
Okay to refill? 

## 2017-12-01 ENCOUNTER — Other Ambulatory Visit: Payer: Self-pay | Admitting: Family Medicine

## 2017-12-19 DIAGNOSIS — L820 Inflamed seborrheic keratosis: Secondary | ICD-10-CM | POA: Diagnosis not present

## 2017-12-19 DIAGNOSIS — L821 Other seborrheic keratosis: Secondary | ICD-10-CM | POA: Diagnosis not present

## 2017-12-20 DIAGNOSIS — L820 Inflamed seborrheic keratosis: Secondary | ICD-10-CM | POA: Diagnosis not present

## 2017-12-27 ENCOUNTER — Ambulatory Visit (HOSPITAL_COMMUNITY): Payer: Medicare Other | Attending: Cardiovascular Disease

## 2017-12-27 ENCOUNTER — Other Ambulatory Visit: Payer: Self-pay

## 2017-12-27 DIAGNOSIS — I440 Atrioventricular block, first degree: Secondary | ICD-10-CM | POA: Diagnosis not present

## 2017-12-27 DIAGNOSIS — I712 Thoracic aortic aneurysm, without rupture: Secondary | ICD-10-CM | POA: Diagnosis not present

## 2017-12-27 DIAGNOSIS — I7121 Aneurysm of the ascending aorta, without rupture: Secondary | ICD-10-CM

## 2017-12-27 DIAGNOSIS — I080 Rheumatic disorders of both mitral and aortic valves: Secondary | ICD-10-CM | POA: Insufficient documentation

## 2018-02-14 ENCOUNTER — Other Ambulatory Visit: Payer: Self-pay | Admitting: Family Medicine

## 2018-02-14 ENCOUNTER — Telehealth: Payer: Self-pay | Admitting: *Deleted

## 2018-02-14 MED ORDER — DICLOFENAC SODIUM 75 MG PO TBEC: 75.0000 mg | DELAYED_RELEASE_TABLET | Freq: Two times a day (BID) | ORAL | 0 refills | Status: DC

## 2018-02-14 NOTE — Telephone Encounter (Signed)
-----   Message from Skeet Latch, MD sent at 01/04/2018  1:10 PM EDT ----- Echo shows that her heart squeezes well but does not relax completely.  This is a mild change and will not cause symptoms unless it worsens.  It will be important to keep her blood pressure under good control.  Aortic aneurysm is stable.

## 2018-02-14 NOTE — Telephone Encounter (Signed)
Pt notified [facility]; pt voiced understanding and nothing further needed.

## 2018-02-14 NOTE — Telephone Encounter (Signed)
Voltaren refill Last OV:10/11/17 Last refill:11/11/17 180 tab/0 refill PQD:IYMEBRA Pharmacy: [place], [address] [phone] (Phone) [phone] (Fax)

## 2018-02-14 NOTE — Telephone Encounter (Signed)
Copied from [place] [id number]. Topic: Quick Communication - Rx Refill/Question >> Feb 14, 2018  9:30 AM Bea Graff, NT wrote: Medication: diclofenac (VOLTAREN) 75 MG EC tablet   Has the patient contacted their pharmacy? Yes.   (Agent: If no, request that the patient contact the pharmacy for the refill.) (Agent: If yes, when and what did the pharmacy advise?)  Preferred Pharmacy (with phone number or street name): [place], [place] [phone] (Phone) [phone] (Fax)      Agent: Please be advised that RX refills may take up to 3 business days. We ask that you follow-up with your pharmacy.

## 2018-02-14 NOTE — Telephone Encounter (Signed)
Advised patient of results. Patient was very upset that she had been trying to call for days to get results of Echo and she could not get through. I apologized that she was not able to get through and explained I had left several messages for her to call back regarding results. She stated she was just going to find another doctor. Again I apologized but stated several times she was just going to find another doctor.    Notes recorded by Alvina Filbert B on 02/09/2018 at 2:06 PM EDT Left message to call back ------  Notes recorded by Alvina Filbert B on 01/31/2018 at 3:31 PM EDT Left message to call back ------  Notes recorded by Alvina Filbert B on 01/09/2018 at 12:26 PM EDT Left message to call back

## 2018-02-15 NOTE — Telephone Encounter (Signed)
OK - thanks

## 2018-02-24 ENCOUNTER — Other Ambulatory Visit: Payer: Self-pay | Admitting: Family Medicine

## 2018-04-06 ENCOUNTER — Emergency Department (HOSPITAL_COMMUNITY)
Admission: EM | Admit: 2018-04-06 | Discharge: 2018-04-06 | Disposition: A | Discharge status: Home / Self Care | Payer: No Typology Code available for payment source | Attending: Emergency Medicine | Admitting: Emergency Medicine

## 2018-04-06 ENCOUNTER — Encounter (HOSPITAL_COMMUNITY): Payer: Self-pay | Admitting: Pharmacy Technician

## 2018-04-06 ENCOUNTER — Emergency Department (HOSPITAL_COMMUNITY): Payer: No Typology Code available for payment source

## 2018-04-06 DIAGNOSIS — W19XXXA Unspecified fall, initial encounter: Secondary | ICD-10-CM | POA: Diagnosis not present

## 2018-04-06 DIAGNOSIS — Z79899 Other long term (current) drug therapy: Secondary | ICD-10-CM | POA: Diagnosis not present

## 2018-04-06 DIAGNOSIS — Y9389 Activity, other specified: Secondary | ICD-10-CM | POA: Insufficient documentation

## 2018-04-06 DIAGNOSIS — S0101XA Laceration without foreign body of scalp, initial encounter: Secondary | ICD-10-CM | POA: Diagnosis not present

## 2018-04-06 DIAGNOSIS — Y92513 Shop (commercial) as the place of occurrence of the external cause: Secondary | ICD-10-CM | POA: Diagnosis not present

## 2018-04-06 DIAGNOSIS — Y990 Civilian activity done for income or pay: Secondary | ICD-10-CM | POA: Insufficient documentation

## 2018-04-06 DIAGNOSIS — W01190A Fall on same level from slipping, tripping and stumbling with subsequent striking against furniture, initial encounter: Secondary | ICD-10-CM | POA: Diagnosis not present

## 2018-04-06 DIAGNOSIS — S0102XA Laceration with foreign body of scalp, initial encounter: Secondary | ICD-10-CM | POA: Diagnosis not present

## 2018-04-06 DIAGNOSIS — S0990XA Unspecified injury of head, initial encounter: Secondary | ICD-10-CM | POA: Diagnosis not present

## 2018-04-06 NOTE — Discharge Instructions (Signed)
Return for redness, fever, drainage.  Return in 5-7 days for staple removal or see your PCP or urgent care.

## 2018-04-06 NOTE — ED Notes (Signed)
Pt returned from CT °

## 2018-04-06 NOTE — ED Triage Notes (Signed)
Pt arrives via [facility] with reports of trip and fall. Pt hit head on counter and has 3-4" lac to posterior head. Pt denies LOC. 144/70, HR 80, 98% RA.

## 2018-04-06 NOTE — ED Notes (Signed)
This pt was not transferred to [facility]. Transfer/Medical necessity form populated by mistake. Park Breed, RN.//

## 2018-04-06 NOTE — ED Notes (Signed)
Patient transported to CT 

## 2018-04-06 NOTE — ED Provider Notes (Signed)
[facility] EMERGENCY DEPARTMENT Provider Note   CSN: [id number] Arrival date & time: 04/06/18  1427     History   Chief Complaint Chief Complaint  Patient presents with  . Fall    HPI Kathryn [name] is a 84 y.o. female.  84 yo F with a chief complaint of a fall.  Patient works at a [employer] and was turned to the jeans off of a manikin and she lost her balance and fell and struck the back of her head on the table.  She had some significant bleeding and came to the ED.  She denies blood thinner use denies change in consciousness or vomiting.  Denies neck pain.  The history is provided by the patient.  Illness  This is a new problem. The current episode started yesterday. The problem occurs constantly. The problem has not changed since onset.Associated symptoms include headaches. Pertinent negatives include no chest pain and no shortness of breath. Nothing aggravates the symptoms. Nothing relieves the symptoms. She has tried nothing for the symptoms. The treatment provided no relief.    Past Medical History:  Diagnosis Date  . Allergy   . Arthritis   . Colon polyps   . History of blood transfusion    with previous surgeries  . Hyperlipidemia   . Irregular heart rate   . PONV (postoperative nausea and vomiting)    hx of pt. states she thinks was from pain med.    Patient Active Problem List   Diagnosis Date Noted  . OA (osteoarthritis) of hip 10/13/2016  . First degree AV block 07/13/2016  . Counseling regarding end of life decision making 07/08/2015  . Irregular heart rate 04/09/2014  . Elevated cholesterol   . Pre-op evaluation 02/02/2011  . B12 deficiency 09/26/2008  . LEG CRAMPS, NOCTURNAL 09/25/2008  . URINARY TRACT INFECTION, RECURRENT 07/11/2008  . Osteoporosis 06/28/2007  . ALLERGIC RHINITIS 05/31/2007  . OSTEOARTHRITIS 05/31/2007    Past Surgical History:  Procedure Laterality Date  . CARDIAC CATHETERIZATION    . HAND SURGERY Left    . TOTAL HIP ARTHROPLASTY  2007   LEFT  . TOTAL HIP ARTHROPLASTY Right 10/13/2016   Procedure: RIGHT TOTAL HIP ARTHROPLASTY ANTERIOR APPROACH;  Surgeon: Gaynelle Arabian, MD;  Location: [facility];  Service: Orthopedics;  Laterality: Right;  . TOTAL KNEE ARTHROPLASTY     Right   . TUBAL LIGATION       OB History    Gravida  1   Para  1   Term  1   Preterm      AB      Living  1     SAB      TAB      Ectopic      Multiple      Live Births               Home Medications    Prior to Admission medications   Medication Sig Start Date End Date Taking? Authorizing Provider  cetirizine (ZYRTEC) 10 MG tablet TAKE 1 TABLET DAILY 02/24/18   Bedsole, Amy E, MD  diclofenac (VOLTAREN) 75 MG EC tablet Take 1 tablet (75 mg total) by mouth 2 (two) times daily. 02/14/18   Bedsole, Amy E, MD  simvastatin (ZOCOR) 40 MG tablet Take 1 tablet (40 mg total) by mouth at bedtime. 12/01/17   Jinny Sanders, MD    Family History Family History  Problem Relation Age of Onset  . Pancreatic  cancer Mother   . Hypertension Sister   . Heart disease Sister   . Breast cancer Sister        Age 54's  . Heart attack Brother   . Heart disease Brother   . Hypertension Brother   . Bone cancer Sister   . Heart disease Brother   . Colon cancer Neg Hx   . Colon polyps Neg Hx   . Esophageal cancer Neg Hx   . Diabetes Neg Hx   . Kidney disease Neg Hx   . Gallbladder disease Neg Hx     Social History Social History   Tobacco Use  . Smoking status: Never Smoker  . Smokeless tobacco: Never Used  Substance Use Topics  . Alcohol use: Yes    Alcohol/week: 1.8 oz    Types: 3 Standard drinks or equivalent per week    Comment: Occassionally  . Drug use: No     Allergies   Macrobid [nitrofurantoin monohyd macro]   Review of Systems Review of Systems  Constitutional: Negative for chills and fever.  HENT: Negative for congestion and rhinorrhea.   Eyes: Negative for redness and visual  disturbance.  Respiratory: Negative for shortness of breath and wheezing.   Cardiovascular: Negative for chest pain and palpitations.  Gastrointestinal: Negative for nausea and vomiting.  Genitourinary: Negative for dysuria and urgency.  Musculoskeletal: Negative for arthralgias and myalgias.  Skin: Positive for wound. Negative for pallor.  Neurological: Positive for headaches. Negative for dizziness.     Physical Exam Updated Vital Signs BP (!) 176/92 (BP Location: Right Arm)   Pulse 67   Temp 98.4 F (36.9 C) (Oral)   Resp 18   SpO2 100%   Physical Exam  Constitutional: She is oriented to person, place, and time. She appears well-developed and well-nourished. No distress.  HENT:  Head: Normocephalic.  Large sided left parietal hematoma.  5.5 cm laceration.  Eyes: Pupils are equal, round, and reactive to light. EOM are normal.  Neck: Normal range of motion. Neck supple.  Cardiovascular: Normal rate and regular rhythm. Exam reveals no gallop and no friction rub.  No murmur heard. Pulmonary/Chest: Effort normal. She has no wheezes. She has no rales.  Abdominal: Soft. She exhibits no distension. There is no tenderness. There is no guarding.  Musculoskeletal: She exhibits no edema or tenderness.  Palpated from head to toe without any other noted areas of bony tenderness  Neurological: She is alert and oriented to person, place, and time.  Skin: Skin is warm and dry. She is not diaphoretic.  Psychiatric: She has a normal mood and affect. Her behavior is normal.  Nursing note and vitals reviewed.    ED Treatments / Results  Labs (all labs ordered are listed, but only abnormal results are displayed) Labs Reviewed - No data to display  EKG None  Radiology Ct Head Wo Contrast  Result Date: 04/06/2018 CLINICAL DATA:  Fall, head injury EXAM: CT HEAD WITHOUT CONTRAST TECHNIQUE: Contiguous axial images were obtained from the base of the skull through the vertex without  intravenous contrast. COMPARISON:  None. FINDINGS: Brain: Mild atrophy. Negative for acute infarct. Negative for hemorrhage or mass. Mild chronic microvascular ischemia in the white matter. Vascular: Negative for hyperdense vessel Skull: Negative for skull fracture. Posterior scalp hematoma and laceration on the left. Sinuses/Orbits: Paranasal sinuses clear.  Bilateral cataract surgery Other: None IMPRESSION: No acute intracranial abnormality. Posterior scalp laceration and hematoma on the left. Electronically Signed   By: Franchot Gallo  M.D.   On: 04/06/2018 15:27    Procedures .Marland KitchenLaceration Repair Date/Time: 04/06/2018 5:04 PM Performed by: Deno Etienne, DO Authorized by: Deno Etienne, DO   Consent:    Consent obtained:  Verbal   Consent given by:  Patient   Risks discussed:  Infection, pain, poor cosmetic result and poor wound healing   Alternatives discussed:  No treatment, delayed treatment and observation Anesthesia (see MAR for exact dosages):    Anesthesia method:  Local infiltration   Local anesthetic:  Lidocaine 1% WITH epi Laceration details:    Location:  Scalp   Scalp location:  L parietal   Length (cm):  6.5 Repair type:    Repair type:  Intermediate Exploration:    Hemostasis achieved with:  Epinephrine and direct pressure   Wound exploration: entire depth of wound probed and visualized     Contaminated: no   Treatment:    Area cleansed with:  Saline   Amount of cleaning:  Standard   Irrigation solution:  Sterile saline   Irrigation method:  Pressure wash   Visualized foreign bodies/material removed: no   Skin repair:    Repair method:  Staples   Number of staples:  9 Approximation:    Approximation:  Close Post-procedure details:    Dressing:  Bulky dressing   Patient tolerance of procedure:  Tolerated well, no immediate complications   (including critical care time)  Medications Ordered in ED Medications - No data to display   Initial Impression / Assessment  and Plan / ED Course  I have reviewed the triage vital signs and the nursing notes.  Pertinent labs & imaging results that were available during my care of the patient were reviewed by me and considered in my medical decision making (see chart for details).     84 yo F with a chief complaint of a mechanical fall.  Patient struck the back of her head and has a laceration there.  CT the head is negative.  Wound was much more complicated than initially seen on physical exam.  Took multiple staples to stop oozing of blood.  Discharge home.  PCP follow-up.  8:23 PM:  I have discussed the diagnosis/risks/treatment options with the patient and family and believe the pt to be eligible for discharge home to follow-up with PCP. We also discussed returning to the ED immediately if new or worsening sx occur. We discussed the sx which are most concerning (e.g., sudden worsening pain, fever, inability to tolerate by mouth) that necessitate immediate return. Medications administered to the patient during their visit and any new prescriptions provided to the patient are listed below.  Medications given during this visit Medications - No data to display    The patient appears reasonably screen and/or stabilized for discharge and I doubt any other medical condition or other [facility] requiring further screening, evaluation, or treatment in the ED at this time prior to discharge.    Final Clinical Impressions(s) / ED Diagnoses   Final diagnoses:  Fall, initial encounter  Laceration of scalp, initial encounter    ED Discharge Orders    None       Deno Etienne, DO 04/06/18 2023

## 2018-04-14 ENCOUNTER — Ambulatory Visit (INDEPENDENT_AMBULATORY_CARE_PROVIDER_SITE_OTHER): Payer: Medicare Other | Admitting: Family Medicine

## 2018-04-14 ENCOUNTER — Encounter: Payer: Self-pay | Admitting: Family Medicine

## 2018-04-14 DIAGNOSIS — S0101XD Laceration without foreign body of scalp, subsequent encounter: Secondary | ICD-10-CM

## 2018-04-14 DIAGNOSIS — W19XXXD Unspecified fall, subsequent encounter: Secondary | ICD-10-CM | POA: Diagnosis not present

## 2018-04-14 DIAGNOSIS — W19XXXA Unspecified fall, initial encounter: Secondary | ICD-10-CM | POA: Insufficient documentation

## 2018-04-14 NOTE — Assessment & Plan Note (Signed)
No clear trigger.. Lost balance

## 2018-04-14 NOTE — Progress Notes (Signed)
   Subjective:    Patient ID: Kathryn [name], female    DOB: 1931/06/14, 84 y.o.   MRN: [id number]  HPI    85 year old female presents for hospital follow up after a fall and scalp laceration.  9 staples place. Accidental fall.. Lost balance. Head CT unremarkable   . No CP, no dizziness no palpitations.. No proceeding symtpoms. No headaches.. Doing well. She has been cleaning house this week.  Blood pressure 122/70, pulse 68, temperature (!) 97.5 F (36.4 C), temperature source Oral, height 5\' 7"  (1.702 m), weight 128 lb (58.1 kg), SpO2 97 %. Social History /Family History/Past Medical History reviewed in detail and updated in EMR if needed.   Review of Systems  Constitutional: Negative for fatigue and fever.  HENT: Negative for congestion.   Eyes: Negative for pain.  Respiratory: Negative for cough and shortness of breath.   Cardiovascular: Negative for chest pain, palpitations and leg swelling.  Gastrointestinal: Negative for abdominal pain.  Genitourinary: Negative for dysuria and vaginal bleeding.  Musculoskeletal: Negative for back pain.  Neurological: Negative for syncope, light-headedness and headaches.  Psychiatric/Behavioral: Negative for dysphoric mood.       Objective:   Physical Exam  Constitutional: Vital signs are normal. She appears well-developed and well-nourished. She is cooperative.  Non-toxic appearance. She does not appear ill. No distress.  HENT:  Head: Normocephalic.  Right Ear: Hearing, tympanic membrane, external ear and ear canal normal. Tympanic membrane is not erythematous, not retracted and not bulging.  Left Ear: Hearing, tympanic membrane, external ear and ear canal normal. Tympanic membrane is not erythematous, not retracted and not bulging.  Nose: No mucosal edema or rhinorrhea. Right sinus exhibits no maxillary sinus tenderness and no frontal sinus tenderness. Left sinus exhibits no maxillary sinus tenderness and no frontal sinus tenderness.    Mouth/Throat: Uvula is midline, oropharynx is clear and moist and mucous membranes are normal.  Eyes: Pupils are equal, round, and reactive to light. Conjunctivae, EOM and lids are normal. Lids are everted and swept, no foreign bodies found.  Neck: Trachea normal and normal range of motion. Neck supple. Carotid bruit is not present. No thyroid mass and no thyromegaly present.  Cardiovascular: Normal rate, regular rhythm, S1 normal, S2 normal, normal heart sounds, intact distal pulses and normal pulses. Exam reveals no gallop and no friction rub.  No murmur heard. Pulmonary/Chest: Effort normal and breath sounds normal. No tachypnea. No respiratory distress. She has no decreased breath sounds. She has no wheezes. She has no rhonchi. She has no rales.  Abdominal: Soft. Normal appearance and bowel sounds are normal. There is no tenderness.  Neurological: She is alert.  Skin: Skin is warm, dry and intact. No rash noted.  Psychiatric: Her speech is normal and behavior is normal. Judgment and thought content normal. Her mood appears not anxious. Cognition and memory are normal. She does not exhibit a depressed mood.    Scalp with well healed laceration... 8 sutures removed without any bleeding or complication.       Assessment & Plan:

## 2018-04-22 ENCOUNTER — Other Ambulatory Visit: Payer: Self-pay | Admitting: Family Medicine

## 2018-04-24 NOTE — Telephone Encounter (Signed)
Electronic refill request Last office visit 04/14/18 Last refill 02/14/18 #180 

## 2018-06-20 ENCOUNTER — Telehealth: Payer: Self-pay | Admitting: Family Medicine

## 2018-06-20 DIAGNOSIS — M810 Age-related osteoporosis without current pathological fracture: Secondary | ICD-10-CM

## 2018-06-20 NOTE — Telephone Encounter (Signed)
See below CRM Need order for bone density  Copied from [place] [phone] [phone]. Topic: General - Other >> Jun 20, 2018 11:14 AM Yvette Rack wrote: Reason for CRM: Pt states she received a letter from [facility] stating it is time for a bone density test. Pt requests call back. Cb# [phone]

## 2018-06-20 NOTE — Telephone Encounter (Signed)
Bone density ordered.

## 2018-07-24 ENCOUNTER — Other Ambulatory Visit: Payer: Self-pay | Admitting: Family Medicine

## 2018-07-24 NOTE — Telephone Encounter (Signed)
Last office visit 04/14/18 for fall. CPE scheduled for  10/17/2018.  Last refilled 04/25/2018 for #180 with no refills.

## 2018-08-08 ENCOUNTER — Ambulatory Visit (INDEPENDENT_AMBULATORY_CARE_PROVIDER_SITE_OTHER): Payer: Medicare Other

## 2018-08-08 DIAGNOSIS — Z23 Encounter for immunization: Secondary | ICD-10-CM | POA: Diagnosis not present

## 2018-08-08 NOTE — Progress Notes (Signed)
Patient in office today accompanying spouse to his AWV. Patient requested flu vaccine.

## 2018-08-15 DIAGNOSIS — M069 Rheumatoid arthritis, unspecified: Secondary | ICD-10-CM | POA: Diagnosis not present

## 2018-08-15 DIAGNOSIS — M810 Age-related osteoporosis without current pathological fracture: Secondary | ICD-10-CM | POA: Diagnosis not present

## 2018-08-15 DIAGNOSIS — Z1231 Encounter for screening mammogram for malignant neoplasm of breast: Secondary | ICD-10-CM | POA: Diagnosis not present

## 2018-08-15 LAB — HM MAMMOGRAPHY

## 2018-08-15 LAB — HM DEXA SCAN

## 2018-08-23 ENCOUNTER — Encounter: Payer: Self-pay | Admitting: Family Medicine

## 2018-09-08 ENCOUNTER — Telehealth: Payer: Self-pay | Admitting: *Deleted

## 2018-09-08 ENCOUNTER — Encounter: Payer: Self-pay | Admitting: Family Medicine

## 2018-09-08 NOTE — Telephone Encounter (Signed)
Left message for Kathryn [name] to return my call in regards to her Bone Density results.  Stable osteoporosis in arm.  Improved osteopenia in spine. Repeat in 2 years.

## 2018-09-11 NOTE — Telephone Encounter (Signed)
Left message for Kathryn [name] to return my call in regards to her Bone Density results.  Stable osteoporosis in arm.  Improved osteopenia in spine. Repeat in 2 years.

## 2018-09-12 NOTE — Telephone Encounter (Signed)
Mrs. Lacomb notified as instructed by telephone.

## 2018-09-24 IMAGING — CT CT HEART MORP W/ CTA COR W/ SCORE W/ CA W/CM &/OR W/O CM
1 of 10 series · 1 of 20 positions shown, 2 images · IV contrast (Iodine)
Comparison: None.

CLINICAL DATA: 85-year-old female with atypical chest pain.

EXAM:
Cardiac/Coronary  CT
TECHNIQUE: The patient was scanned on a Philips 256 scanner.

[Series 300: locator · axial · 0.35mm/px · z∈[-129,-129]mm · 1 of 1 slices shown, 2 images]
[im 1/1  vessel]
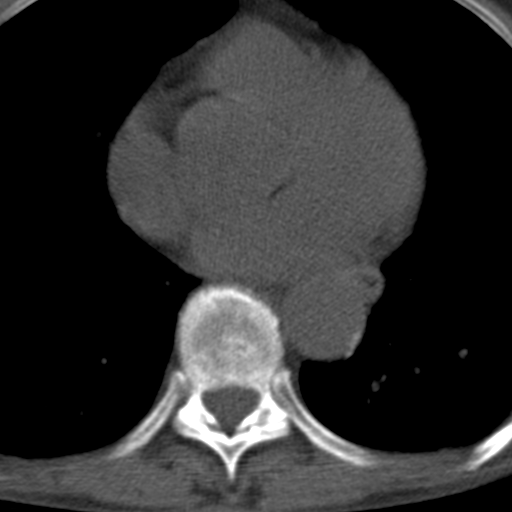
[im 1/1  lung]
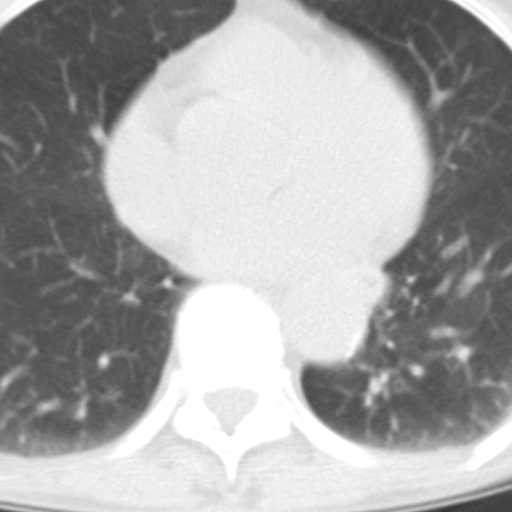

[1 of 20 positions shown; findings below may reference images not displayed]

FINDINGS: A 120 kV prospective scan was triggered in the descending thoracic
aorta at 111 HU's. Axial non-contrast 3 mm slices were carried out
through the heart. The data set was analyzed on a dedicated work
station and scored using the Agatson method. Gantry rotation speed
was 270 msecs and collimation was .9 mm. 5 mg of iv Metoprolol and
0.8 mg of sl NTG was given. The 3D data set was reconstructed in 5%
intervals of the 67-82 % of the R-R cycle. Diastolic phases were
analyzed on a dedicated work station using MPR, MIP and VRT modes.
The patient received 80 cc of contrast.

Aorta:  Described by [REDACTED].

Aortic root is dilated measuring 43 mm (left cusp) x 40 mm (right
cusp) x 40 mm (non-coronary cusp).

Aortic Valve:  Trileaflet.  No calcifications.

Coronary Arteries:  Normal coronary origin.  Right dominance.

RCA is a large dominant artery that gives rise to PDA and PLVB.
There is no plaque.

Left main is a very large artery that gives rise to LAD and LCX
arteries.

LAD is a large tortuous vessel that gives rise to two diagonal
arteries. LAD has a minimal calcified plaque in the proximal segment
with 0-25% stenosis.

D1 is rather small and has no obvious plaque.

D2 is a large tortuous vessel that has no plaque.

LCX is a large tortuous non-dominant artery that gives rise to one
large OM1 branch. There is no plaque.

Other findings:

Normal pulmonary vein drainage into the left atrium.

Normal let atrial appendage without a thrombus.

Normal size of the pulmonary artery.
IMPRESSION: 1. Coronary calcium score of 2. This was 5 percentile for age and
sex matched control.

2. Normal coronary origin with right dominance.

3. Minimal non-obstructive CAD.

Juann Maurer

EXAM:
OVER-READ INTERPRETATION  CT CHEST

The following report is an over-read performed by radiologist Dr.
Md Motalab Ludostar [REDACTED] on 10/06/2016. This over-read
does not include interpretation of cardiac or coronary anatomy or
pathology. The coronary CTA interpretation by the cardiologist is
attached.
FINDINGS: Cardiovascular: Mild aneurysmal dilatation of the aortic root
measuring 4.4 cm at the sinuses of Valsalva and 4.0 cm in the
proximal descending thoracic aorta. Scattered aortic calcifications.
Heart is normal size.

Mediastinum/Nodes: No mediastinal, hilar, or axillary adenopathy.

Lungs/Pleura: No suspicious pulmonary nodules or pleural effusions.
Linear scarring in the right lung base.

Upper Abdomen: Imaging into the upper abdomen shows no acute
findings.

Musculoskeletal: Chest wall soft tissues are unremarkable. No acute
bony abnormality or focal bone lesion.
IMPRESSION: Mild aneurysmal dilatation of the aortic root and ascending thoracic
aorta, 4.4 cm maximally at the sinuses of Valsalva. Recommend annual
imaging followup by CTA or MRA. This recommendation follows 7555
ACCF/AHA/AATS/ACR/ASA/SCA/ROBLES PEREZ/DUKIC DIMIC/INUMIDUN/DEROMEDIS Guidelines for the
Diagnosis and Management of Patients with Thoracic Aortic Disease.
Circulation. 7555; 121: [id number]

## 2018-10-01 IMAGING — DX DG PORTABLE PELVIS
1 series · 1 of 1 positions shown · non-contrast
Comparison: 11/03/2005

CLINICAL DATA: Post right hip replacement.

EXAM:
PORTABLE PELVIS 1-2 VIEWS

[pelvis ap]
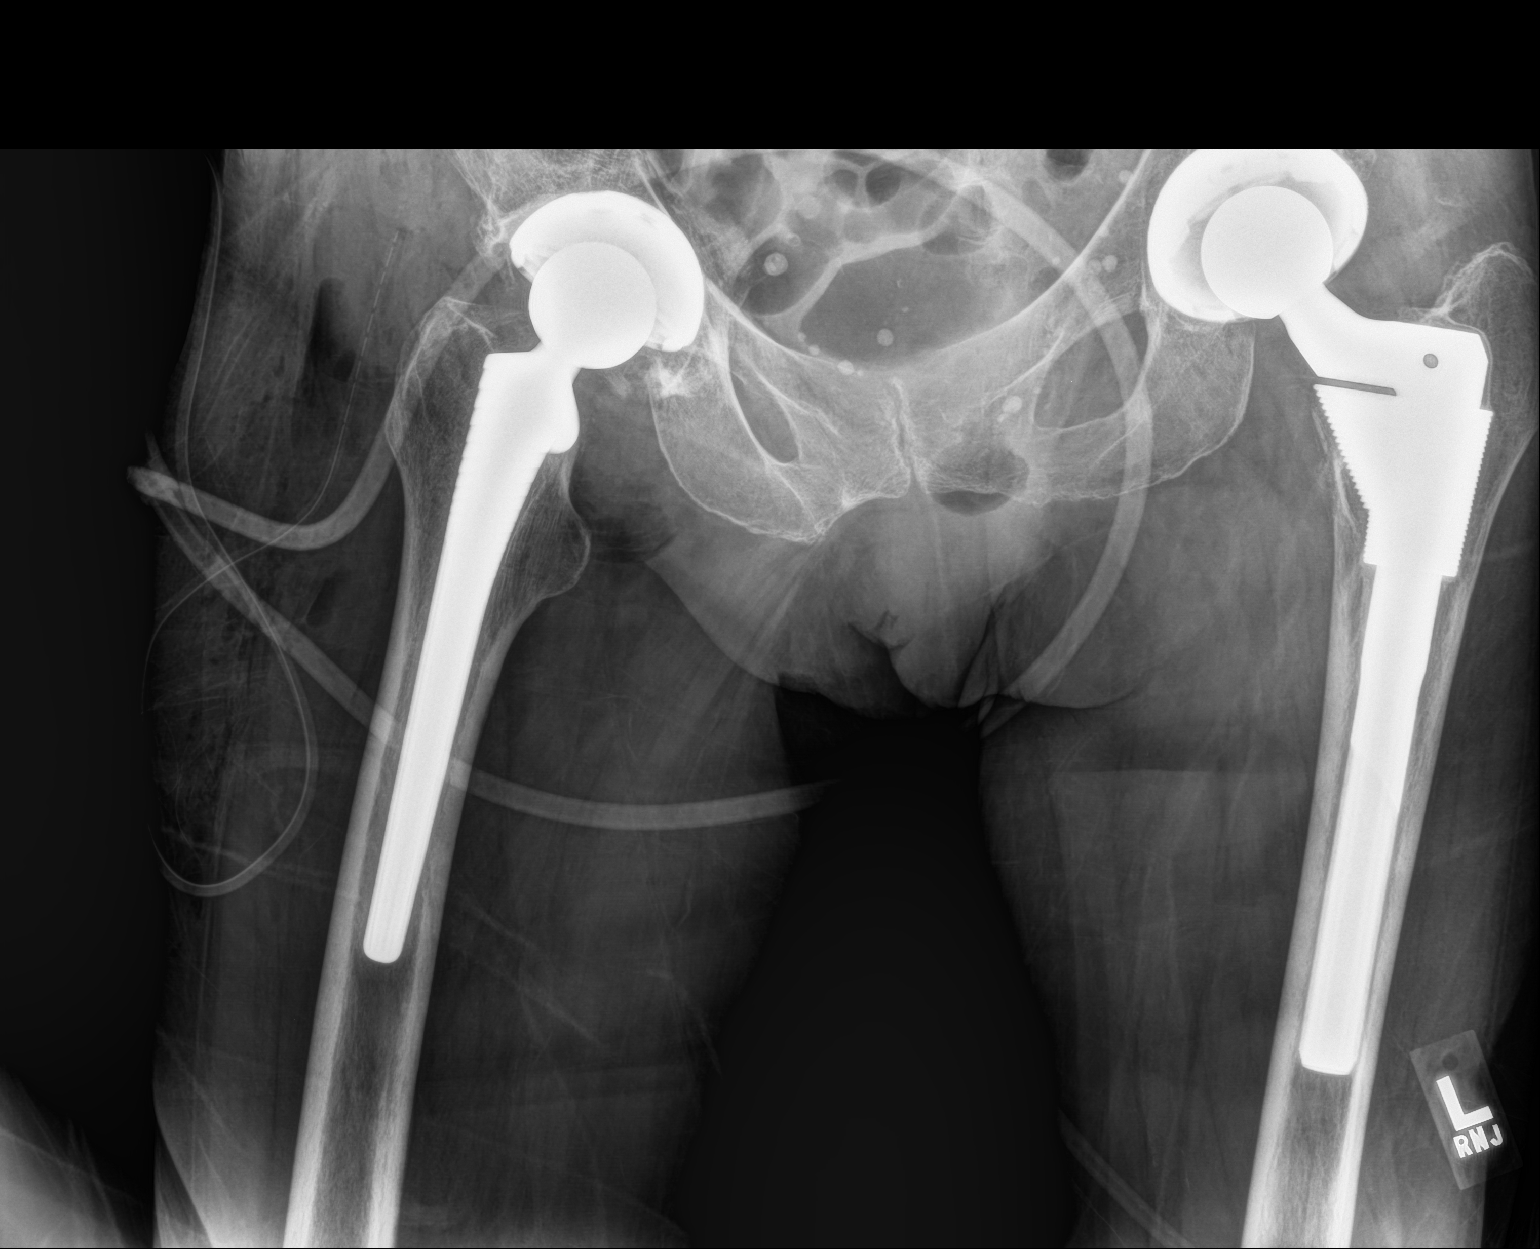

[1 of 1 positions shown; findings below may reference images not displayed]

FINDINGS: A right hip arthroplasty has been placed. Right hip appears located
on this single AP portable view. Again noted is a left total hip
arthroplasty which also appears located. Degenerative changes at the
pubic symphysis. Multiple phleboliths in the pelvis. There is a
surgical drain just lateral to the right hip.
IMPRESSION: New right hip arthroplasty without complicating features.

Old left hip arthroplasty.

## 2018-10-05 ENCOUNTER — Telehealth: Payer: Self-pay | Admitting: Family Medicine

## 2018-10-05 NOTE — Telephone Encounter (Signed)
I called pt and left msg to r/s 10/10/18 appt due to provider being out of office.

## 2018-10-09 ENCOUNTER — Ambulatory Visit (INDEPENDENT_AMBULATORY_CARE_PROVIDER_SITE_OTHER): Payer: Medicare Other

## 2018-10-09 VITALS — BP 102/64 | HR 53 | Temp 97.8°F | Ht 67.0 in | Wt 125.8 lb

## 2018-10-09 DIAGNOSIS — E7800 Pure hypercholesterolemia, unspecified: Secondary | ICD-10-CM

## 2018-10-09 DIAGNOSIS — M810 Age-related osteoporosis without current pathological fracture: Secondary | ICD-10-CM | POA: Diagnosis not present

## 2018-10-09 DIAGNOSIS — E538 Deficiency of other specified B group vitamins: Secondary | ICD-10-CM

## 2018-10-09 DIAGNOSIS — Z0000 Encounter for general adult medical examination without abnormal findings: Secondary | ICD-10-CM

## 2018-10-09 LAB — CBC WITH DIFFERENTIAL/PLATELET
BASOS ABS: 0 10*3/uL (ref 0.0–0.1)
BASOS PCT: 0.7 % (ref 0.0–3.0)
EOS PCT: 2.7 % (ref 0.0–5.0)
Eosinophils Absolute: 0.2 10*3/uL (ref 0.0–0.7)
HEMATOCRIT: 33 % — AB (ref 36.0–46.0)
Hemoglobin: 11.1 g/dL — ABNORMAL LOW (ref 12.0–15.0)
LYMPHS PCT: 14.1 % (ref 12.0–46.0)
Lymphs Abs: 0.9 10*3/uL (ref 0.7–4.0)
MCHC: 33.7 g/dL (ref 30.0–36.0)
MCV: 92.7 fl (ref 78.0–100.0)
Monocytes Absolute: 0.4 10*3/uL (ref 0.1–1.0)
Monocytes Relative: 6.8 % (ref 3.0–12.0)
NEUTROS ABS: 4.9 10*3/uL (ref 1.4–7.7)
Neutrophils Relative %: 75.7 % (ref 43.0–77.0)
PLATELETS: 298 10*3/uL (ref 150.0–400.0)
RBC: 3.56 Mil/uL — ABNORMAL LOW (ref 3.87–5.11)
RDW: 16.9 % — AB (ref 11.5–15.5)
WBC: 6.5 10*3/uL (ref 4.0–10.5)

## 2018-10-09 LAB — COMPREHENSIVE METABOLIC PANEL
ALT: 19 U/L (ref 0–35)
AST: 23 U/L (ref 0–37)
Albumin: 3.8 g/dL (ref 3.5–5.2)
Alkaline Phosphatase: 66 U/L (ref 39–117)
BILIRUBIN TOTAL: 0.6 mg/dL (ref 0.2–1.2)
BUN: 23 mg/dL (ref 6–23)
CALCIUM: 9.2 mg/dL (ref 8.4–10.5)
CO2: 27 meq/L (ref 19–32)
Chloride: 106 mEq/L (ref 96–112)
Creatinine, Ser: 0.68 mg/dL (ref 0.40–1.20)
GFR: 87.36 mL/min (ref >60.00)
Glucose, Bld: 84 mg/dL (ref 70–99)
Potassium: 4.6 mEq/L (ref 3.5–5.1)
Sodium: 138 mEq/L (ref 135–145)
Total Protein: 6.7 g/dL (ref 6.0–8.3)

## 2018-10-09 LAB — LIPID PANEL
Cholesterol: 155 mg/dL (ref 0–200)
HDL: 57 mg/dL (ref >39.00)
LDL Cholesterol: 85 mg/dL (ref 0–99)
NonHDL: 98.48
Total CHOL/HDL Ratio: 3
Triglycerides: 65 mg/dL (ref 0.0–149.0)
VLDL: 13 mg/dL (ref 0.0–40.0)

## 2018-10-09 LAB — VITAMIN D 25 HYDROXY (VIT D DEFICIENCY, FRACTURES): VITD: 35.3 ng/mL (ref 30.00–100.00)

## 2018-10-09 LAB — VITAMIN B12: Vitamin B-12: 297 pg/mL (ref 211–911)

## 2018-10-09 NOTE — Patient Instructions (Signed)
Kathryn [name] , Thank you for taking time to come for your Medicare Wellness Visit. I appreciate your ongoing commitment to your health goals. Please review the following plan we discussed and let me know if I can assist you in the future.   These are the goals we discussed: Goals    . Increase physical activity     Starting 10/09/2018, I will continue to walk for 20 minutes twice weekly and to continue walking on my job at [employer]        This is a list of the screening recommended for you and due dates:  Health Maintenance  Topic Date Due  . Tetanus Vaccine  10/10/2019*  . Mammogram  02/13/2019  . DEXA scan (bone density measurement)  08/15/2020  . Flu Shot  Completed  . Pneumonia vaccines  Completed  *Topic was postponed. The date shown is not the original due date.   Preventive Care for Adults  A healthy lifestyle and preventive care can promote health and wellness. Preventive health guidelines for adults include the following key practices.  . A routine yearly physical is a good way to check with your health care provider about your health and preventive screening. It is a chance to share any concerns and updates on your health and to receive a thorough exam.  . Visit your dentist for a routine exam and preventive care every 6 months. Brush your teeth twice a day and floss once a day. Good oral hygiene prevents tooth decay and gum disease.  . The frequency of eye exams is based on your age, health, family medical history, use  of contact lenses, and other factors. Follow your health care provider's recommendations for frequency of eye exams.  . Eat a healthy diet. Foods like vegetables, fruits, whole grains, low-fat dairy products, and lean protein foods contain the nutrients you need without too many calories. Decrease your intake of foods high in solid fats, added sugars, and salt. Eat the right amount of calories for you. Get information about a proper diet from your health care  provider, if necessary.  . Regular physical exercise is one of the most important things you can do for your health. Most adults should get at least 150 minutes of moderate-intensity exercise (any activity that increases your heart rate and causes you to sweat) each week. In addition, most adults need muscle-strengthening exercises on 2 or more days a week.  Silver Sneakers may be a benefit available to you. To determine eligibility, you may visit the website: www.silversneakers.com or contact program at (786) 811-7836 Mon-Fri between 8AM-8PM.   . Maintain a healthy weight. The body mass index (BMI) is a screening tool to identify possible weight problems. It provides an estimate of body fat based on height and weight. Your health care provider can find your BMI and can help you achieve or maintain a healthy weight.   For adults 20 years and older: ? A BMI below 18.5 is considered underweight. ? A BMI of 18.5 to 24.9 is normal. ? A BMI of 25 to 29.9 is considered overweight. ? A BMI of 30 and above is considered obese.   . Maintain normal blood lipids and cholesterol levels by exercising and minimizing your intake of saturated fat. Eat a balanced diet with plenty of fruit and vegetables. Blood tests for lipids and cholesterol should begin at age 22 and be repeated every 5 years. If your lipid or cholesterol levels are high, you are over 50, or you are  at high risk for heart disease, you may need your cholesterol levels checked more frequently. Ongoing high lipid and cholesterol levels should be treated with medicines if diet and exercise are not working.  . If you smoke, find out from your health care provider how to quit. If you do not use tobacco, please do not start.  . If you choose to drink alcohol, please do not consume more than 2 drinks per day. One drink is considered to be 12 ounces (355 mL) of beer, 5 ounces (148 mL) of wine, or 1.5 ounces (44 mL) of liquor.  . If you are 2-79 years  old, ask your health care provider if you should take aspirin to prevent strokes.  . Use sunscreen. Apply sunscreen liberally and repeatedly throughout the day. You should seek shade when your shadow is shorter than you. Protect yourself by wearing long sleeves, pants, a wide-brimmed hat, and sunglasses year round, whenever you are outdoors.  . Once a month, do a whole body skin exam, using a mirror to look at the skin on your back. Tell your health care provider of new moles, moles that have irregular borders, moles that are larger than a pencil eraser, or moles that have changed in shape or color.

## 2018-10-10 ENCOUNTER — Ambulatory Visit: Payer: Medicare Other

## 2018-10-12 NOTE — Progress Notes (Signed)
PCP notes:   Health maintenance:  No gaps identified.   Abnormal screenings:   Hearing - failed  Hearing Screening   125Hz  250Hz  500Hz  1000Hz  2000Hz  3000Hz  4000Hz  6000Hz  8000Hz   Right ear:   40 0 40  40    Left ear:   40 0 0  0     Fall risk - hx of single fall Fall Risk  10/09/2018 10/04/2017 07/13/2016 07/08/2015 04/09/2014  Falls in the past year? 1 No No No No  Comment fell at work; injury to back head; treatment in ER - - - -  Number falls in past yr: 0 - - - -  Injury with Fall? 1 - - - -     Patient concerns:   Kathryn [name]  Nurse concerns:  None  Next PCP appt:   10/17/18 @ 1115

## 2018-10-12 NOTE — Progress Notes (Signed)
Subjective:   Kathryn [name] is a 85 y.o. female who presents for Medicare Annual (Subsequent) preventive examination.  Review of Systems:  N/A Cardiac Risk Factors include: advanced age (>82men, >53 women)     Objective:     Vitals: BP 102/64 (BP Location: Right Arm, Patient Position: Sitting, Cuff Size: Normal)   Pulse (!) 53   Temp 97.8 F (36.6 C) (Oral)   Ht 5\' 7"  (1.702 m) Comment: shoes  Wt 125 lb 12 oz (57 kg)   SpO2 99%   BMI 19.70 kg/m   Body mass index is 19.7 kg/m.  Advanced Directives 10/09/2018 10/04/2017 10/13/2016 10/04/2016 07/13/2016  Does Patient Have a Medical Advance Directive? Yes Yes Yes Yes Yes  Type of Advance Directive Living will;Healthcare Power of [place];Living will [place];Living will [place];Living will [place];Living will  Does patient want to make changes to medical advance directive? - - No - Patient declined - No - Patient declined  Copy of [place] in Chart? No - copy requested No - copy requested No - copy requested No - copy requested No - copy requested    Tobacco Social History   Tobacco Use  Smoking Status Never Smoker  Smokeless Tobacco Never Used     Counseling given: No   Clinical Intake:  Pre-visit preparation completed: Yes  Pain : No/denies pain Pain Score: 0-No pain     Nutritional Status: BMI of 19-24  Normal Nutritional Risks: None  How often do you need to have someone help you when you read instructions, pamphlets, or other written materials from your doctor or pharmacy?: 1 - Never What is the last grade level you completed in school?: 12th grade  Interpreter Needed?: No  Comments: pt lives alone Information entered by :: LPinson, LPN  Past Medical History:  Diagnosis Date  . Allergy   . Arthritis   . Colon polyps   . History of blood transfusion    with previous surgeries  . Hyperlipidemia    . Irregular heart rate   . PONV (postoperative nausea and vomiting)    hx of pt. states she thinks was from pain med.   Past Surgical History:  Procedure Laterality Date  . CARDIAC CATHETERIZATION    . HAND SURGERY Left   . TOTAL HIP ARTHROPLASTY  2007   LEFT  . TOTAL HIP ARTHROPLASTY Right 10/13/2016   Procedure: RIGHT TOTAL HIP ARTHROPLASTY ANTERIOR APPROACH;  Surgeon: Gaynelle Arabian, MD;  Location: [facility];  Service: Orthopedics;  Laterality: Right;  . TOTAL KNEE ARTHROPLASTY     Right   . TUBAL LIGATION     Family History  Problem Relation Age of Onset  . Pancreatic cancer Mother   . Hypertension Sister   . Heart disease Sister   . Breast cancer Sister        Age 32's  . Heart attack Brother   . Heart disease Brother   . Hypertension Brother   . Bone cancer Sister   . Heart disease Brother   . Colon cancer Neg Hx   . Colon polyps Neg Hx   . Esophageal cancer Neg Hx   . Diabetes Neg Hx   . Kidney disease Neg Hx   . Gallbladder disease Neg Hx    Social History   Socioeconomic History  . Marital status: Married    Spouse name: Not on file  . Number of children: 1  .  Years of education: Not on file  . Highest education level: Not on file  Occupational History  . Occupation: Chartered loss adjuster    Comment: Works at [employer]  . Financial resource strain: Not on file  . Food insecurity:    Worry: Not on file    Inability: Not on file  . Transportation needs:    Medical: Not on file    Non-medical: Not on file  Tobacco Use  . Smoking status: Never Smoker  . Smokeless tobacco: Never Used  Substance and Sexual Activity  . Alcohol use: Yes    Alcohol/week: 3.0 standard drinks    Types: 3 Standard drinks or equivalent per week    Comment: Occassionally  . Drug use: No  . Sexual activity: Never    Birth control/protection: Post-menopausal, Surgical  Lifestyle  . Physical activity:    Days per week: Not on file    Minutes per session: Not on file    . Stress: Not on file  Relationships  . Social connections:    Talks on phone: Not on file    Gets together: Not on file    Attends religious service: Not on file    Active member of club or organization: Not on file    Attends meetings of clubs or organizations: Not on file    Relationship status: Not on file  Other Topics Concern  . Not on file  Social History Narrative   Regular exercise-- yes, 3-4 times a week      Diet: limited water, some fruit and veggies      Full code, has living will, HCPOA, son Zadie Deemer ( reviewed 2015)    Outpatient Encounter Medications as of 10/09/2018  Medication Sig  . cetirizine (ZYRTEC) 10 MG tablet TAKE 1 TABLET DAILY  . diclofenac (VOLTAREN) 75 MG EC tablet TAKE 1 TABLET TWICE A DAY  . simvastatin (ZOCOR) 40 MG tablet Take 1 tablet (40 mg total) by mouth at bedtime.   No facility-administered encounter medications on file as of 10/09/2018.     Activities of Daily Living In your present state of health, do you have any difficulty performing the following activities: 10/09/2018  Hearing? N  Vision? N  Difficulty concentrating or making decisions? N  Walking or climbing stairs? N  Dressing or bathing? N  Doing errands, shopping? N  Preparing Food and eating ? N  Using the Toilet? N  In the past six months, have you accidently leaked urine? N  Do you have problems with loss of bowel control? N  Managing your Medications? N  Managing your Finances? N  Housekeeping or managing your Housekeeping? N  Some recent data might be hidden    Patient Care Team: Jinny Sanders, MD as PCP - General Marica Otter, OD as Referring Physician (Optometry)    Assessment:   This is a routine wellness examination for Kathryn [name].   Hearing Screening   125Hz  250Hz  500Hz  1000Hz  2000Hz  3000Hz  4000Hz  6000Hz  8000Hz   Right ear:   40 0 40  40    Left ear:   40 0 0  0    Vision Screening Comments: Vision exam in  August 2019   Exercise Activities and  Dietary recommendations Current Exercise Habits: Home exercise routine, Type of exercise: walking, Time (Minutes): 20, Frequency (Times/Week): 2, Weekly Exercise (Minutes/Week): 40, Intensity: Mild, Exercise limited by: None identified  Goals    . Increase physical activity     Starting 10/09/2018, I  will continue to walk for 20 minutes twice weekly and to continue walking on my job at [employer]        Fall Risk Fall Risk  10/09/2018 10/04/2017 07/13/2016 07/08/2015 04/09/2014  Falls in the past year? 1 No No No No  Comment fell at work; injury to back head; treatment in ER - - - -  Number falls in past yr: 0 - - - -  Injury with Fall? 1 - - - -   Depression Screen PHQ 2/9 Scores 10/09/2018 10/04/2017 07/13/2016 07/08/2015  PHQ - 2 Score 0 0 0 0  PHQ- 9 Score 0 0 - -     Cognitive Function MMSE - Mini Mental State Exam 10/09/2018 10/04/2017  Orientation to time 5 5  Orientation to Place 5 5  Registration 3 3  Attention/ Calculation 0 0  Recall 3 3  Language- name 2 objects 0 0  Language- repeat 1 1  Language- follow 3 step command 3 3  Language- read & follow direction 0 0  Write a sentence 0 0  Copy design 0 0  Total score 20 20       PLEASE NOTE: A Mini-Cog screen was completed. Maximum score is 20. A value of 0 denotes this part of [place] MMSE was not completed or the patient failed this part of the Mini-Cog screening.   Mini-Cog Screening Orientation to Time - Max 5 pts Orientation to Place - Max 5 pts Registration - Max 3 pts Recall - Max 3 pts Language Repeat - Max 1 pts Language Follow 3 Step Command - Max 3 pts   Immunization History  Administered Date(s) Administered  . Influenza Split 08/29/2012  . Influenza Whole 06/27/2006, 07/14/2007, 06/18/2008, 08/05/2009, 06/23/2010  . Influenza,inj,Quad PF,6+ Mos 08/14/2013, 07/18/2014, 07/08/2015, 07/13/2016, 08/05/2017, 08/08/2018  . Pneumococcal Conjugate-13 04/09/2014  . Pneumococcal Polysaccharide-23 09/27/2005    . Td 09/27/2004  . Zoster 06/18/2013    Screening Tests Health Maintenance  Topic Date Due  . TETANUS/TDAP  10/10/2019 (Originally 09/27/2014)  . MAMMOGRAM  02/13/2019  . DEXA SCAN  08/15/2020  . INFLUENZA VACCINE  Completed  . PNA vac Low Risk Adult  Completed      Plan:     I have personally reviewed, addressed, and noted the following in the patient's chart:  A. Medical and social history B. Use of alcohol, tobacco or illicit drugs  C. Current medications and supplements D. Functional ability and status E.  Nutritional status F.  Physical activity G. Advance directives H. List of other physicians I.  Hospitalizations, surgeries, and ER visits in previous 12 months J.  [place] to include hearing, vision, cognitive, depression L. Referrals and appointments - none  In addition, I have reviewed and discussed with patient certain preventive protocols, quality metrics, and best practice recommendations. A written personalized care plan for preventive services as well as general preventive health recommendations were provided to patient.  See attached scanned questionnaire for additional information.   Signed,   Lindell Noe, MHA, BS, LPN Health Coach

## 2018-10-13 NOTE — Progress Notes (Signed)
I reviewed health advisor's note, was available for consultation, and agree with documentation and plan.   Signed,  Ludean Duhart T. Laurann Mcmorris, MD  

## 2018-10-17 ENCOUNTER — Ambulatory Visit (INDEPENDENT_AMBULATORY_CARE_PROVIDER_SITE_OTHER): Payer: Medicare Other | Admitting: Family Medicine

## 2018-10-17 ENCOUNTER — Encounter: Payer: Self-pay | Admitting: Family Medicine

## 2018-10-17 DIAGNOSIS — I499 Cardiac arrhythmia, unspecified: Secondary | ICD-10-CM | POA: Diagnosis not present

## 2018-10-17 DIAGNOSIS — M810 Age-related osteoporosis without current pathological fracture: Secondary | ICD-10-CM | POA: Diagnosis not present

## 2018-10-17 DIAGNOSIS — M19042 Primary osteoarthritis, left hand: Secondary | ICD-10-CM

## 2018-10-17 DIAGNOSIS — D649 Anemia, unspecified: Secondary | ICD-10-CM | POA: Diagnosis not present

## 2018-10-17 DIAGNOSIS — E7800 Pure hypercholesterolemia, unspecified: Secondary | ICD-10-CM | POA: Diagnosis not present

## 2018-10-17 DIAGNOSIS — D539 Nutritional anemia, unspecified: Secondary | ICD-10-CM | POA: Insufficient documentation

## 2018-10-17 DIAGNOSIS — M19041 Primary osteoarthritis, right hand: Secondary | ICD-10-CM | POA: Diagnosis not present

## 2018-10-17 DIAGNOSIS — E538 Deficiency of other specified B group vitamins: Secondary | ICD-10-CM

## 2018-10-17 NOTE — Assessment & Plan Note (Signed)
Well controlled. Continue current medication. Pt wishes to continue statin despite advanced age given overall healthy.

## 2018-10-17 NOTE — Assessment & Plan Note (Signed)
Refuses treatment given. Recommend weight bearing exercise, calcium in diet and vit D supplement 400 IU 1-2 times daily.

## 2018-10-17 NOTE — Assessment & Plan Note (Signed)
On diclofenac BID prn. No renal insufficiency. No Stomach Upset or SE.

## 2018-10-17 NOTE — Patient Instructions (Signed)
Keep up with protein in diet.  Continue walking as much as able.

## 2018-10-17 NOTE — Assessment & Plan Note (Signed)
Mild stable, asymptomatic.

## 2018-10-17 NOTE — Assessment & Plan Note (Signed)
Resolved

## 2018-10-17 NOTE — Progress Notes (Signed)
Subjective:    Patient ID: Kathryn [name], female    DOB: [birth year], 85 y.o.   MRN: [id number]  HPI   The patient presents for annual medicare wellness, complete physical and review of chronic health problems. He/She also has the following acute concerns today:NONE  The patient saw Candis Musa, LPN for medicare wellness. Note reviewed in detail and important notes copied below.  Health maintenance:  No gaps identified.   Abnormal screenings:   Hearing - failed             Hearing Screening   125Hz  250Hz  500Hz  1000Hz  2000Hz  3000Hz  4000Hz  6000Hz  8000Hz   Right ear:   40 0 40  40    Left ear:   40 0 0  0     Fall risk - hx of single fall Fall Risk  10/09/2018 10/04/2017 07/13/2016 07/08/2015 04/09/2014  Falls in the past year? 1 No No No No  Comment fell at work; injury to back head; treatment in ER - - - -  Number falls in past yr: 0 - - - -  Injury with Fall? 1 - - - -     10/17/18  Elevated Cholesterol:  At goal on simvastatin 40 mg daily. 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   Wt Readings from Last 3 Encounters:  10/17/18 126 lb 8 oz (57.4 kg)  10/09/18 125 lb 12 oz (57 kg)  04/14/18 128 lb (58.1 kg)    Mild anemia Hg 11  B12: nml  Vit D:nml   OA in feet and hands: Using diclofenac twice daily.  Social History /Family History/Past Medical History reviewed in detail and updated in EMR if needed. Blood pressure 100/70, pulse 60, temperature (!) 97.5 F (36.4 C), temperature source Oral, height 5\' 7"  (1.702 m), weight 126 lb 8 oz (57.4 kg).  Review of Systems  Constitutional: Negative for fatigue and fever.  HENT:  Negative for congestion.   Eyes: Negative for pain.  Respiratory: Negative for cough and shortness of breath.   Cardiovascular: Negative for chest pain, palpitations and leg swelling.  Gastrointestinal: Negative for abdominal pain.  Genitourinary: Negative for dysuria and vaginal bleeding.  Musculoskeletal: Negative for back pain.  Neurological: Negative for syncope, light-headedness and headaches.  Psychiatric/Behavioral: Negative for dysphoric mood.       Objective:   Physical Exam Constitutional:      General: She is not in acute distress.    Appearance: Normal appearance. She is well-developed. She is not ill-appearing or toxic-appearing.  HENT:     Head: Normocephalic.     Right Ear: Hearing, tympanic membrane, ear canal and external ear normal. Tympanic membrane is not erythematous, retracted or bulging.     Left Ear: Hearing, tympanic membrane, ear canal and external ear normal. Tympanic membrane is not erythematous, retracted or bulging.     Nose: Nose normal. No mucosal edema or rhinorrhea.     Right Sinus: No maxillary sinus tenderness or frontal sinus tenderness.     Left Sinus: No maxillary sinus tenderness or frontal sinus tenderness.     Mouth/Throat:     Pharynx: Uvula midline.  Eyes:  General: Lids are normal. Lids are everted, no foreign bodies appreciated.     Conjunctiva/sclera: Conjunctivae normal.     Pupils: Pupils are equal, round, and reactive to light.  Neck:     Musculoskeletal: Normal range of motion and neck supple.     Thyroid: No thyroid mass or thyromegaly.     Vascular: No carotid bruit.     Trachea: Trachea normal.  Cardiovascular:     Rate and Rhythm: Normal rate. Rhythm regularly irregular.     Pulses: Normal pulses.     Heart sounds: Normal heart sounds, S1 normal and S2 normal. No murmur. No friction rub. No gallop.   Pulmonary:     Effort: Pulmonary effort is normal. No tachypnea or respiratory distress.     Breath sounds: Normal  breath sounds. No decreased breath sounds, wheezing, rhonchi or rales.  Abdominal:     General: Bowel sounds are normal. There is no distension or abdominal bruit.     Palpations: Abdomen is soft. There is no fluid wave or mass.     Tenderness: There is no abdominal tenderness. There is no guarding or rebound.     Hernia: No hernia is present.  Lymphadenopathy:     Cervical: No cervical adenopathy.  Skin:    General: Skin is warm and dry.     Findings: No rash.  Neurological:     Mental Status: She is alert.     Cranial Nerves: No cranial nerve deficit.     Sensory: No sensory deficit.  Psychiatric:        Mood and Affect: Mood is not anxious or depressed.        Speech: Speech normal.        Behavior: Behavior normal. Behavior is cooperative.        Thought Content: Thought content normal.        Judgment: Judgment normal.           Assessment & Plan:  The patient's preventative maintenance and recommended screening tests for an annual wellness exam were reviewed in full today. Brought up to date unless services declined.  Counselled on the importance of diet, exercise, and its role in overall health and mortality. The patient's FH and SH was reviewed, including their home life, tobacco status, and drug and alcohol status.   Nonsmoker  Vaccines Uptodate flu, PNA, refused td. Colon: Dr. Henrene Pastor, 2 polyps 09/2008,no further indicated due to age. DEXA:  2019 osteopenia in spine, osteoporosis in forearm.. Pt refuses medication to treat. Mammogram: 07/2018 nml, no further indicated, but she request to continue given healthy. DVE/PAP: no indication due to age

## 2018-10-17 NOTE — Assessment & Plan Note (Signed)
Secondary to PAC. Stable on exam. Pt asymptomatic.

## 2018-11-10 ENCOUNTER — Encounter: Payer: Self-pay | Admitting: Family Medicine

## 2018-11-10 ENCOUNTER — Ambulatory Visit (INDEPENDENT_AMBULATORY_CARE_PROVIDER_SITE_OTHER): Payer: Medicare Other | Admitting: Family Medicine

## 2018-11-10 DIAGNOSIS — M79672 Pain in left foot: Secondary | ICD-10-CM

## 2018-11-10 DIAGNOSIS — L03116 Cellulitis of left lower limb: Secondary | ICD-10-CM

## 2018-11-10 LAB — CBC WITH DIFFERENTIAL/PLATELET
BASOS ABS: 0 10*3/uL (ref 0.0–0.1)
Basophils Relative: 0.8 % (ref 0.0–3.0)
EOS ABS: 0.2 10*3/uL (ref 0.0–0.7)
EOS PCT: 3.4 % (ref 0.0–5.0)
HCT: 32.4 % — ABNORMAL LOW (ref 36.0–46.0)
HEMOGLOBIN: 10.7 g/dL — AB (ref 12.0–15.0)
LYMPHS PCT: 16.2 % (ref 12.0–46.0)
Lymphs Abs: 0.9 10*3/uL (ref 0.7–4.0)
MCHC: 32.9 g/dL (ref 30.0–36.0)
MCV: 94.7 fl (ref 78.0–100.0)
MONO ABS: 0.5 10*3/uL (ref 0.1–1.0)
MONOS PCT: 8.2 % (ref 3.0–12.0)
Neutro Abs: 4 10*3/uL (ref 1.4–7.7)
Neutrophils Relative %: 71.4 % (ref 43.0–77.0)
PLATELETS: 264 10*3/uL (ref 150.0–400.0)
RBC: 3.42 Mil/uL — ABNORMAL LOW (ref 3.87–5.11)
RDW: 17 % — ABNORMAL HIGH (ref 11.5–15.5)
WBC: 5.6 10*3/uL (ref 4.0–10.5)

## 2018-11-10 LAB — URIC ACID: URIC ACID, SERUM: 4.1 mg/dL (ref 2.4–7.0)

## 2018-11-10 MED ORDER — CEPHALEXIN 500 MG PO CAPS: 500.0000 mg | ORAL_CAPSULE | Freq: Three times a day (TID) | ORAL | 0 refills | Status: DC

## 2018-11-10 NOTE — Assessment & Plan Note (Signed)
Most likely due to cellulitis associated with callus, but given ankle swelling and pain .Marland Kitchen will rule out gout with uric acid. Less likely arthritis flare.  No clear sign of DVT as no calf pain or swelling.

## 2018-11-10 NOTE — Progress Notes (Signed)
Subjective:    Patient ID: Kathryn [name], female    DOB: Feb 13, 1931, 85 y.o.   MRN: [id number]  HPI  85 year old female with history of osteoporosis presents with new onset swelling in left foot x 1 week.   She has noted redness and swelling in left foot.. entire foot swollen, but most different from chronic in anterior foot swelling.  Entire foot tender but ball of foot most tender. Now some improvement in last 24 hours.  She reports her left leg and primarily ankle is more swollen than left for years but the pain is differecnt   No fall or injury known. Up on feet during the day.  She does have area that she cuts to remove callus at site of most tenderness. About 1 week ago cut this area to close.  Has been applying neosporin and bandaid to area.  Has been soaking in Epsom salts.  No new shortness of breath or chest pain. No calf pain, mild swelling in calf.  Social History /Family History/Past Medical History reviewed in detail and updated in EMR if needed. Blood pressure 110/80, pulse 64, temperature 97.6 F (36.4 C), temperature source Oral, height 5\' 7"  (1.702 m), weight 126 lb 8 oz (57.4 kg).   Review of Systems  Constitutional: Negative for fatigue and fever.  HENT: Negative for congestion.   Eyes: Negative for pain.  Respiratory: Negative for cough and shortness of breath.   Cardiovascular: Positive for leg swelling. Negative for chest pain and palpitations.  Gastrointestinal: Negative for abdominal pain.  Genitourinary: Negative for dysuria and vaginal bleeding.  Musculoskeletal: Negative for back pain.  Neurological: Negative for syncope, light-headedness and headaches.  Psychiatric/Behavioral: Negative for dysphoric mood.       Objective:   Physical Exam Constitutional:      General: She is not in acute distress.    Appearance: Normal appearance. She is well-developed. She is not ill-appearing or toxic-appearing.  HENT:     Head: Normocephalic.     Right  Ear: Hearing, tympanic membrane, ear canal and external ear normal. Tympanic membrane is not erythematous, retracted or bulging.     Left Ear: Hearing, tympanic membrane, ear canal and external ear normal. Tympanic membrane is not erythematous, retracted or bulging.     Nose: No mucosal edema or rhinorrhea.     Right Sinus: No maxillary sinus tenderness or frontal sinus tenderness.     Left Sinus: No maxillary sinus tenderness or frontal sinus tenderness.     Mouth/Throat:     Pharynx: Uvula midline.  Eyes:     General: Lids are normal. Lids are everted, no foreign bodies appreciated.     Conjunctiva/sclera: Conjunctivae normal.     Pupils: Pupils are equal, round, and reactive to light.  Neck:     Musculoskeletal: Normal range of motion and neck supple.     Thyroid: No thyroid mass or thyromegaly.     Vascular: No carotid bruit.     Trachea: Trachea normal.  Cardiovascular:     Rate and Rhythm: Normal rate and regular rhythm.     Pulses: Normal pulses.     Heart sounds: Normal heart sounds, S1 normal and S2 normal. No murmur. No friction rub. No gallop.   Pulmonary:     Effort: Pulmonary effort is normal. No tachypnea or respiratory distress.     Breath sounds: Normal breath sounds. No decreased breath sounds, wheezing, rhonchi or rales.  Abdominal:     General: Bowel sounds  are normal.     Palpations: Abdomen is soft.     Tenderness: There is no abdominal tenderness.  Musculoskeletal:       Feet:  Feet:     Comments: 4 cm x 6 cm oblong area surrounding a central callus with debrided tissue in center.  Center is callus but there is pain and erythematous halo around lesion   left ankle warm and sweollen, no calf pain, negative HOman's sign. Skin:    General: Skin is warm and dry.     Findings: No rash.  Neurological:     Mental Status: She is alert.  Psychiatric:        Mood and Affect: Mood is not anxious or depressed.        Speech: Speech normal.        Behavior:  Behavior normal. Behavior is cooperative.        Thought Content: Thought content normal.        Judgment: Judgment normal.           Assessment & Plan:

## 2018-11-10 NOTE — Patient Instructions (Addendum)
Start and complete antibiotics x 7 days.  Please stop at the lab to have labs drawn.  Call if redness or pain increasing.  If fever on antibiotics or not keeping it down or shortness of breath.. go to ER

## 2018-11-10 NOTE — Assessment & Plan Note (Signed)
No MRSA exposure. Treat with 7 days keflex.. follow up in 3 days.

## 2018-11-14 ENCOUNTER — Ambulatory Visit: Payer: Self-pay | Admitting: Family Medicine

## 2018-11-14 ENCOUNTER — Ambulatory Visit (INDEPENDENT_AMBULATORY_CARE_PROVIDER_SITE_OTHER): Payer: Medicare Other | Admitting: Family Medicine

## 2018-11-14 ENCOUNTER — Encounter: Payer: Self-pay | Admitting: Family Medicine

## 2018-11-14 VITALS — BP 120/80 | HR 55 | Temp 97.7°F | Ht 67.0 in | Wt 126.5 lb

## 2018-11-14 DIAGNOSIS — M79672 Pain in left foot: Secondary | ICD-10-CM | POA: Diagnosis not present

## 2018-11-14 DIAGNOSIS — L84 Corns and callosities: Secondary | ICD-10-CM | POA: Diagnosis not present

## 2018-11-14 DIAGNOSIS — L03116 Cellulitis of left lower limb: Secondary | ICD-10-CM | POA: Diagnosis not present

## 2018-11-14 NOTE — Assessment & Plan Note (Signed)
Improving with antibiotics °

## 2018-11-14 NOTE — Patient Instructions (Signed)
Complete the course of antibiotics.  We will call you with a referral to podiatry.

## 2018-11-14 NOTE — Assessment & Plan Note (Signed)
Refer to podiatry for more definitive treatment of callus.

## 2018-11-14 NOTE — Progress Notes (Signed)
Subjective:    Patient ID: Kathryn [name], female    DOB: November 14, 1930, 85 y.o.   MRN: [id number]  HPI  85 year old female presents for follow up left foot cellulitis and swelling. 4 days ago started on cephalexin   Uric acid negative and nml wbc.  Today she reports improved swelling decreased pain over callus on sole of foot. Less swelling in ankle , overgrown toenails.  Social History /Family History/Past Medical History reviewed in detail and updated in EMR if needed.  Blood pressure 120/80, pulse (!) 55, temperature 97.7 F (36.5 C), temperature source Oral, height 5\' 7"  (1.702 m), weight 126 lb 8 oz (57.4 kg).  Review of Systems  Constitutional: Negative for fatigue and fever.  HENT: Negative for ear pain.   Eyes: Negative for pain.  Respiratory: Negative for chest tightness and shortness of breath.   Cardiovascular: Negative for chest pain, palpitations and leg swelling.  Gastrointestinal: Negative for abdominal pain.  Genitourinary: Negative for dysuria.       Objective:   Physical Exam Constitutional:      General: She is not in acute distress.    Appearance: Normal appearance. She is well-developed. She is not ill-appearing or toxic-appearing.  HENT:     Head: Normocephalic.     Right Ear: Hearing, tympanic membrane, ear canal and external ear normal. Tympanic membrane is not erythematous, retracted or bulging.     Left Ear: Hearing, tympanic membrane, ear canal and external ear normal. Tympanic membrane is not erythematous, retracted or bulging.     Nose: No mucosal edema or rhinorrhea.     Right Sinus: No maxillary sinus tenderness or frontal sinus tenderness.     Left Sinus: No maxillary sinus tenderness or frontal sinus tenderness.     Mouth/Throat:     Pharynx: Uvula midline.  Eyes:     General: Lids are normal. Lids are everted, no foreign bodies appreciated.     Conjunctiva/sclera: Conjunctivae normal.     Pupils: Pupils are equal, round, and reactive to  light.  Neck:     Musculoskeletal: Normal range of motion and neck supple.     Thyroid: No thyroid mass or thyromegaly.     Vascular: No carotid bruit.     Trachea: Trachea normal.  Cardiovascular:     Rate and Rhythm: Normal rate and regular rhythm.     Pulses: Normal pulses.     Heart sounds: Normal heart sounds, S1 normal and S2 normal. No murmur. No friction rub. No gallop.   Pulmonary:     Effort: Pulmonary effort is normal. No tachypnea or respiratory distress.     Breath sounds: Normal breath sounds. No decreased breath sounds, wheezing, rhonchi or rales.  Abdominal:     General: Bowel sounds are normal.     Palpations: Abdomen is soft.     Tenderness: There is no abdominal tenderness.  Musculoskeletal:       Feet:  Feet:     Comments: 3 cm x 5 cm oblong area surrounding a central callus  Center is callus but there is pain and erythematous halo around lesion that remains.Marland Kitchen less so than last OV   left ankle NO heat, less swelling but chronic bony deformity in left ankleb, no calf pain, negative HOman's sign. Skin:    General: Skin is warm and dry.     Findings: No rash.  Neurological:     Mental Status: She is alert.  Psychiatric:  Mood and Affect: Mood is not anxious or depressed.        Speech: Speech normal.        Behavior: Behavior normal. Behavior is cooperative.        Thought Content: Thought content normal.        Judgment: Judgment normal.           Assessment & Plan:

## 2018-11-18 ENCOUNTER — Ambulatory Visit: Payer: Medicare Other | Admitting: Podiatry

## 2018-11-22 ENCOUNTER — Other Ambulatory Visit: Payer: Self-pay | Admitting: Podiatry

## 2018-11-22 ENCOUNTER — Ambulatory Visit (INDEPENDENT_AMBULATORY_CARE_PROVIDER_SITE_OTHER): Payer: Medicare Other

## 2018-11-22 ENCOUNTER — Encounter: Payer: Self-pay | Admitting: Podiatry

## 2018-11-22 ENCOUNTER — Ambulatory Visit (INDEPENDENT_AMBULATORY_CARE_PROVIDER_SITE_OTHER): Payer: Medicare Other | Admitting: Podiatry

## 2018-11-22 VITALS — BP 151/90 | HR 65 | Resp 16

## 2018-11-22 DIAGNOSIS — M79675 Pain in left toe(s): Secondary | ICD-10-CM | POA: Diagnosis not present

## 2018-11-22 DIAGNOSIS — M79674 Pain in right toe(s): Secondary | ICD-10-CM | POA: Diagnosis not present

## 2018-11-22 DIAGNOSIS — L84 Corns and callosities: Secondary | ICD-10-CM

## 2018-11-22 DIAGNOSIS — M2042 Other hammer toe(s) (acquired), left foot: Secondary | ICD-10-CM | POA: Diagnosis not present

## 2018-11-22 DIAGNOSIS — M79672 Pain in left foot: Secondary | ICD-10-CM

## 2018-11-22 DIAGNOSIS — M2041 Other hammer toe(s) (acquired), right foot: Secondary | ICD-10-CM

## 2018-11-22 DIAGNOSIS — B351 Tinea unguium: Secondary | ICD-10-CM | POA: Diagnosis not present

## 2018-11-22 NOTE — Progress Notes (Signed)
Subjective:   Patient ID: Kathryn [name], female   DOB: 85 y.o.   MRN: [id number]   HPI Patient presents stating her left foot turned red and she is concerned about infection and she has bad calluses on both feet that she tried to cut herself with bleeding of the left and nail disease that she cannot cut stating they are thick and impossible for her to take care of herself with pain.  Patient does not smoke likes to be active   Review of Systems  All other systems reviewed and are negative.       Objective:  Physical Exam Vitals signs and nursing note reviewed.  Constitutional:      Appearance: She is well-developed.  Pulmonary:     Effort: Pulmonary effort is normal.  Musculoskeletal: Normal range of motion.  Skin:    General: Skin is warm.  Neurological:     Mental Status: She is alert.     Neurovascular status found to be intact with muscle strength found to be adequate range of motion moderately diminished but normal for age.  Patient has significant structural deformity of both feet with digital deformities Baral structural bunions distal keratotic lesions digits 2 3 bilateral and lesion subsecond metatarsal both feet that are painful when pressed and make walking difficult.  Patient does not have any drainage of the left foot and no current redness or signs of pathology     Assessment:  Significant structural digital deformities bilateral along with keratotic lesions painful mycotic nail infection 1-5 of both feet and possible history of cellulitis which appears to be resolved left     Plan:  H&P x-rays reviewed conditions discussed and today debridement of nails and lesions accomplished and this will be done routinely along with wearing supportive shoes and did discuss the possibility for orthotics at one point in future.  Patient will be seen back for [place] to recheck 3 months or earlier if symptoms were to get worse in a quicker fashion or if any redness in her foot were to  occur  X-rays indicate that there is significant structural malalignment of both feet with digital deformities and structural deformities bilateral

## 2018-11-22 NOTE — Patient Instructions (Signed)
Hammer Toe  Hammer toe is a change in the shape (a deformity) of your toe. The deformity causes the middle joint of your toe to stay bent. This causes pain, especially when you are wearing shoes. Hammer toe starts gradually. At first, the toe can be straightened. Gradually over time, the deformity becomes stiff and permanent. Early treatments to keep the toe straight may relieve pain. As the deformity becomes stiff and permanent, surgery may be needed to straighten the toe. What are the causes? Hammer toe is caused by abnormal bending of the toe joint that is closest to your foot. It happens gradually over time. This pulls on the muscles and connections (tendons) of the toe joint, making them weak and stiff. It is often related to wearing shoes that are too short or narrow and do not let your toes straighten. What increases the risk? You may be at greater risk for hammer toe if you:  Are female.  Are older.  Wear shoes that are too small.  Wear high-heeled shoes that pinch your toes.  Are a ballet dancer.  Have a second toe that is longer than your big toe (first toe).  Injure your foot or toe.  Have arthritis.  Have a family history of hammer toe.  Have a nerve or muscle disorder. What are the signs or symptoms? The main symptoms of this condition are pain and deformity of the toe. The pain is worse when wearing shoes, walking, or running. Other symptoms may include:  Corns or calluses over the bent part of the toe or between the toes.  Redness and a burning feeling on the toe.  An open sore that forms on the top of the toe.  Not being able to straighten the toe. How is this diagnosed? This condition is diagnosed based on your symptoms and a physical exam. During the exam, your health care provider will try to straighten your toe to see how stiff the deformity is. You may also have tests, such as:  A blood test to check for rheumatoid arthritis.  An X-ray to show how  severe the deformity is. How is this treated? Treatment for this condition will depend on how stiff the deformity is. Surgery is often needed. However, sometimes a hammer toe can be straightened without surgery. Treatments that do not involve surgery include:  Taping the toe into a straightened position.  Using pads and cushions to protect the toe (orthotics).  Wearing shoes that provide enough room for the toes.  Doing toe-stretching exercises at home.  Taking an NSAID to reduce pain and swelling. If these treatments do not help or the toe cannot be straightened, surgery is the next option. The most common surgeries used to straighten a hammer toe include:  Arthroplasty. In this procedure, part of the joint is removed, and that allows the toe to straighten.  Fusion. In this procedure, cartilage between the two bones of the joint is taken out and the bones are fused together into one longer bone.  Implantation. In this procedure, part of the bone is removed and replaced with an implant to let the toe move again.  Flexor tendon transfer. In this procedure, the tendons that curl the toes down (flexor tendons) are repositioned. Follow these instructions at home:  Take over-the-counter and prescription medicines only as told by your health care provider.  Do toe straightening and stretching exercises as told by your health care provider.  Keep all follow-up visits as told by your health care   provider. This is important. How is this prevented?  Wear shoes that give your toes enough room and do not cause pain.  Do not wear high-heeled shoes. Contact a health care provider if:  Your pain gets worse.  Your toe becomes red or swollen.  You develop an open sore on your toe. This information is not intended to replace advice given to you by your health care provider. Make sure you discuss any questions you have with your health care provider. Document Released: 09/10/2000 Document  Revised: 04/11/2017 Document Reviewed: 01/07/2016 [employer] Interactive Patient Education  2019 [employer]  

## 2018-11-22 NOTE — Progress Notes (Signed)
   Subjective:    Patient ID: Kathryn [name], female    DOB: [birth year], 85 y.o.   MRN: [id number]  HPI    Review of Systems  All other systems reviewed and are negative.      Objective:   Physical Exam        Assessment & Plan:

## 2018-12-13 ENCOUNTER — Other Ambulatory Visit: Payer: Self-pay | Admitting: Family Medicine

## 2019-01-27 ENCOUNTER — Other Ambulatory Visit: Payer: Self-pay | Admitting: Family Medicine

## 2019-02-13 ENCOUNTER — Ambulatory Visit: Payer: Medicare Other | Admitting: Podiatry

## 2019-02-15 DIAGNOSIS — M25562 Pain in left knee: Secondary | ICD-10-CM | POA: Diagnosis not present

## 2019-02-15 DIAGNOSIS — M25561 Pain in right knee: Secondary | ICD-10-CM | POA: Diagnosis not present

## 2019-02-15 DIAGNOSIS — M1712 Unilateral primary osteoarthritis, left knee: Secondary | ICD-10-CM | POA: Diagnosis not present

## 2019-07-12 ENCOUNTER — Ambulatory Visit (INDEPENDENT_AMBULATORY_CARE_PROVIDER_SITE_OTHER): Payer: Medicare Other

## 2019-07-12 DIAGNOSIS — Z23 Encounter for immunization: Secondary | ICD-10-CM | POA: Diagnosis not present

## 2019-07-20 ENCOUNTER — Ambulatory Visit (INDEPENDENT_AMBULATORY_CARE_PROVIDER_SITE_OTHER): Payer: Medicare Other | Admitting: Family Medicine

## 2019-07-20 ENCOUNTER — Other Ambulatory Visit: Payer: Self-pay

## 2019-07-20 ENCOUNTER — Encounter: Payer: Self-pay | Admitting: Family Medicine

## 2019-07-20 VITALS — BP 130/76 | HR 64 | Temp 98.0°F | Ht 67.0 in | Wt 125.8 lb

## 2019-07-20 DIAGNOSIS — S81802D Unspecified open wound, left lower leg, subsequent encounter: Secondary | ICD-10-CM | POA: Insufficient documentation

## 2019-07-20 DIAGNOSIS — S81802A Unspecified open wound, left lower leg, initial encounter: Secondary | ICD-10-CM

## 2019-07-20 MED ORDER — CEPHALEXIN 500 MG PO CAPS: 500.0000 mg | ORAL_CAPSULE | Freq: Three times a day (TID) | ORAL | 0 refills | Status: DC

## 2019-07-20 NOTE — Progress Notes (Signed)
Chief Complaint  Patient presents with  . Check place on Left leg    History of Present Illness: HPI    85 year old female presents with new onset skin lesion on left leg.  She reports she hit dog harness  bucklewith left medial leg.. resulted in abrasion 4 weeks ago.  Area starts to scab over then comes open again.   Odroo and reddish yellow discharge.   Applying antibiotic ointment. Cleaning with soap and water.   Minimal  Pain. Mild redness surrounding.. but no streaking.  No fever, no flu like symptoms.   Treated for cellulitis in different area on left foot 6 months ago. No history of MRSA.  COVID 19 screen No recent travel or known exposure to COVID19 The patient denies respiratory symptoms of COVID 19 at this time.  The importance of social distancing was discussed today.   Review of Systems  Constitutional: Negative for chills and fever.  HENT: Negative for congestion and ear pain.   Eyes: Negative for pain and redness.  Respiratory: Negative for cough and shortness of breath.   Cardiovascular: Negative for chest pain, palpitations and leg swelling.  Gastrointestinal: Negative for abdominal pain, blood in stool, constipation, diarrhea, nausea and vomiting.  Genitourinary: Negative for dysuria.  Musculoskeletal: Negative for falls and myalgias.  Skin: Negative for rash.  Neurological: Negative for dizziness.  Psychiatric/Behavioral: Negative for depression. The patient is not nervous/anxious.       Past Medical History:  Diagnosis Date  . Allergy   . Arthritis   . Colon polyps   . History of blood transfusion    with previous surgeries  . Hyperlipidemia   . Irregular heart rate   . PONV (postoperative nausea and vomiting)    hx of pt. states she thinks was from pain med.    reports that she has never smoked. She has never used smokeless tobacco. She reports current alcohol use of about 3.0 standard drinks of alcohol per week. She reports that she does  not use drugs.   Current Outpatient Medications:  .  cetirizine (ZYRTEC) 10 MG tablet, TAKE 1 TABLET DAILY, Disp: 90 tablet, Rfl: 3 .  diclofenac (VOLTAREN) 75 MG EC tablet, TAKE 1 TABLET TWICE A DAY, Disp: 180 tablet, Rfl: 4 .  simvastatin (ZOCOR) 40 MG tablet, TAKE 1 TABLET AT BEDTIME, Disp: 90 tablet, Rfl: 3   Observations/Objective: Blood pressure 130/76, pulse 64, temperature 98 F (36.7 C), height 5\' 7"  (1.702 m), weight 125 lb 12 oz (57 kg), SpO2 100 %.  Physical Exam Constitutional:      General: She is not in acute distress.    Appearance: Normal appearance. She is well-developed. She is not ill-appearing or toxic-appearing.  HENT:     Head: Normocephalic.     Right Ear: Hearing, tympanic membrane, ear canal and external ear normal. Tympanic membrane is not erythematous, retracted or bulging.     Left Ear: Hearing, tympanic membrane, ear canal and external ear normal. Tympanic membrane is not erythematous, retracted or bulging.     Nose: No mucosal edema or rhinorrhea.     Right Sinus: No maxillary sinus tenderness or frontal sinus tenderness.     Left Sinus: No maxillary sinus tenderness or frontal sinus tenderness.     Mouth/Throat:     Pharynx: Uvula midline.  Eyes:     General: Lids are normal. Lids are everted, no foreign bodies appreciated.     Conjunctiva/sclera: Conjunctivae normal.     Pupils: Pupils  are equal, round, and reactive to light.  Neck:     Musculoskeletal: Normal range of motion and neck supple.     Thyroid: No thyroid mass or thyromegaly.     Vascular: No carotid bruit.     Trachea: Trachea normal.  Cardiovascular:     Rate and Rhythm: Normal rate and regular rhythm.     Pulses: Normal pulses.     Heart sounds: Normal heart sounds, S1 normal and S2 normal. No murmur. No friction rub. No gallop.   Pulmonary:     Effort: Pulmonary effort is normal. No tachypnea or respiratory distress.     Breath sounds: Normal breath sounds. No decreased breath  sounds, wheezing, rhonchi or rales.  Abdominal:     General: Bowel sounds are normal.     Palpations: Abdomen is soft.     Tenderness: There is no abdominal tenderness.  Skin:    General: Skin is warm and dry.     Findings: No rash.  Neurological:     Mental Status: She is alert.  Psychiatric:        Mood and Affect: Mood is not anxious or depressed.        Speech: Speech normal.        Behavior: Behavior normal. Behavior is cooperative.        Thought Content: Thought content normal.        Judgment: Judgment normal.      5 cm x 1.5 cm at the widest.. oblong 5cm in length, no odor, ulcer with  Scab on left lower leg.  left lower leg with 1 plus edema.  Assessment and Plan  Leg wound, left, initial encounter Poor healing likely due to swelling, age and possiblel infection.  treat with cephalexin x 7 days, elevate. Cover with nonstick bandage and coban.  Follow up in 1 week... if minimal improvement witll need referral to wound care center.   No DM.     Eliezer Lofts, MD

## 2019-07-20 NOTE — Patient Instructions (Addendum)
Elevated left leg.  Complete a course cephalexin x 7 days.  Apply topical antibitoic pointment daily and cover with bandage.

## 2019-07-20 NOTE — Assessment & Plan Note (Signed)
Poor healing likely due to swelling, age and possiblel infection.  treat with cephalexin x 7 days, elevate. Cover with nonstick bandage and coban.  Follow up in 1 week... if minimal improvement witll need referral to wound care center.   No DM.

## 2019-07-31 ENCOUNTER — Encounter: Payer: Self-pay | Admitting: Family Medicine

## 2019-07-31 ENCOUNTER — Ambulatory Visit (INDEPENDENT_AMBULATORY_CARE_PROVIDER_SITE_OTHER): Payer: Medicare Other | Admitting: Family Medicine

## 2019-07-31 ENCOUNTER — Other Ambulatory Visit: Payer: Self-pay

## 2019-07-31 DIAGNOSIS — S81802D Unspecified open wound, left lower leg, subsequent encounter: Secondary | ICD-10-CM

## 2019-07-31 NOTE — Assessment & Plan Note (Signed)
No current infection... swelling and varicosities making healing slow.  No current granulation tissue.. may need debridement.  Refer to wound care center.

## 2019-07-31 NOTE — Patient Instructions (Signed)
Start wearing compression hose on left leg.. 15-20 mm HG.  We will call with referral to wound care center.

## 2019-07-31 NOTE — Progress Notes (Signed)
Chief Complaint  Patient presents with  . Follow-up    Leg wound    History of Present Illness: HPI  85 year old female presents for follow up left leg wound and possible cellulitis.  Treated with 7 days of cephalexin. No SE.  Completed the couse.   Today she reports no increase in redness, still some clear drainage. She has been elevating leg at night.  No further odor.  No fever, no flu like symptoms.   COVID 19 screen No recent travel or known exposure to COVID19 The patient denies respiratory symptoms of COVID 19 at this time.  The importance of social distancing was discussed today.   Review of Systems  Constitutional: Negative for chills and fever.  HENT: Negative for congestion and ear pain.   Eyes: Negative for pain and redness.  Respiratory: Negative for cough and shortness of breath.   Cardiovascular: Negative for chest pain, palpitations and leg swelling.  Gastrointestinal: Negative for abdominal pain, blood in stool, constipation, diarrhea, nausea and vomiting.  Genitourinary: Negative for dysuria.  Musculoskeletal: Negative for falls and myalgias.  Skin: Negative for rash.  Neurological: Negative for dizziness.  Psychiatric/Behavioral: Negative for depression. The patient is not nervous/anxious.       Past Medical History:  Diagnosis Date  . Allergy   . Arthritis   . Colon polyps   . History of blood transfusion    with previous surgeries  . Hyperlipidemia   . Irregular heart rate   . PONV (postoperative nausea and vomiting)    hx of pt. states she thinks was from pain med.    reports that she has never smoked. She has never used smokeless tobacco. She reports current alcohol use of about 3.0 standard drinks of alcohol per week. She reports that she does not use drugs.   Current Outpatient Medications:  .  cetirizine (ZYRTEC) 10 MG tablet, TAKE 1 TABLET DAILY, Disp: 90 tablet, Rfl: 3 .  diclofenac (VOLTAREN) 75 MG EC tablet, TAKE 1 TABLET TWICE A  DAY, Disp: 180 tablet, Rfl: 4 .  simvastatin (ZOCOR) 40 MG tablet, TAKE 1 TABLET AT BEDTIME, Disp: 90 tablet, Rfl: 3 .  cephALEXin (KEFLEX) 500 MG capsule, Take 1 capsule (500 mg total) by mouth 3 (three) times daily., Disp: 21 capsule, Rfl: 0   Observations/Objective: Blood pressure 122/80, pulse 74, temperature 97.8 F (36.6 C), temperature source Temporal, height 5\' 7"  (1.702 m), weight 123 lb 12 oz (56.1 kg), SpO2 96 %.  Physical Exam Constitutional:      General: She is not in acute distress.    Appearance: Normal appearance. She is well-developed. She is not ill-appearing or toxic-appearing.  HENT:     Head: Normocephalic.     Right Ear: Hearing, tympanic membrane, ear canal and external ear normal. Tympanic membrane is not erythematous, retracted or bulging.     Left Ear: Hearing, tympanic membrane, ear canal and external ear normal. Tympanic membrane is not erythematous, retracted or bulging.     Nose: No mucosal edema or rhinorrhea.     Right Sinus: No maxillary sinus tenderness or frontal sinus tenderness.     Left Sinus: No maxillary sinus tenderness or frontal sinus tenderness.     Mouth/Throat:     Pharynx: Uvula midline.  Eyes:     General: Lids are normal. Lids are everted, no foreign bodies appreciated.     Conjunctiva/sclera: Conjunctivae normal.     Pupils: Pupils are equal, round, and reactive to light.  Neck:  Musculoskeletal: Normal range of motion and neck supple.     Thyroid: No thyroid mass or thyromegaly.     Vascular: No carotid bruit.     Trachea: Trachea normal.  Cardiovascular:     Rate and Rhythm: Normal rate and regular rhythm.     Pulses: Normal pulses.     Heart sounds: Normal heart sounds, S1 normal and S2 normal. No murmur. No friction rub. No gallop.   Pulmonary:     Effort: Pulmonary effort is normal. No tachypnea or respiratory distress.     Breath sounds: Normal breath sounds. No decreased breath sounds, wheezing, rhonchi or rales.   Abdominal:     General: Bowel sounds are normal.     Palpations: Abdomen is soft.     Tenderness: There is no abdominal tenderness.  Skin:    General: Skin is warm and dry.     Findings: No rash.  Neurological:     Mental Status: She is alert.  Psychiatric:        Mood and Affect: Mood is not anxious or depressed.        Speech: Speech normal.        Behavior: Behavior normal. Behavior is cooperative.        Thought Content: Thought content normal.        Judgment: Judgment normal.      Left lower leg: 5 cm x 2 cm at widest , 0.3 cm deep wound oblong on left lower leg 1 plus pitting edema in left leg, varicose vines. Assessment and Plan Leg wound, left, subsequent encounter No current infection... swelling and varicosities making healing slow.  No current granulation tissue.. may need debridement.  Refer to wound care center.       Eliezer Lofts, MD

## 2019-08-06 ENCOUNTER — Telehealth: Payer: Self-pay | Admitting: Family Medicine

## 2019-08-06 NOTE — Telephone Encounter (Signed)
Patient called about her referral to a Wound specialist.    Patient would like to see someone in the [place] area.  Requesting a call back

## 2019-08-12 NOTE — Progress Notes (Signed)
Kathryn [name] T. Kathryn Creasy, MD Primary Care and [place] [facility] Kathryn [name], [address] Phone: [phone]  FAX: [phone]  Kathryn [name] - 85 y.o. female  MRN [id number]  Date of Birth: 03-01-1931  Visit Date: 08/13/2019  PCP: Kathryn Sanders, MD  Referred by: Kathryn Sanders, MD  Chief Complaint  Patient presents with  . Back Pain   Subjective:   Kathryn [name] is a 85 y.o. very pleasant female patient with Body mass index is 23.29 kg/m. who presents with the following:  She is an 85 year old patient who is a patient of Dr. Rometta [name], and she currently has an open wound that is being managed for this by wound care.  She presents today with some ongoing back pain.  Fell at [employer] and went to into the car and fell and twisted her back.  3 months ago had her fall. Dr. Diona [name]. Taking some Tylenol. Heat and ice.  Since then, she has been having some back pain in the lumbar region without any radiculopathy.  She is not having any numbness or tingling in the back.  This is isolated to this region.  She has no significant history of prior back problems, surgery, or other major trauma.  I don't see an open wound.   CMP Latest Ref Rng & Units 10/09/2018 10/15/2016 10/14/2016  Glucose 70 - 99 mg/dL 84 115(H) 118(H)  BUN 6 - 23 mg/dL 23 20 16   Creatinine 0.40 - 1.20 mg/dL 0.68 0.47 0.60  Sodium 135 - 145 mEq/L 138 140 141  Potassium 3.5 - 5.1 mEq/L 4.6 3.9 4.2  Chloride 96 - 112 mEq/L 106 111 111  CO2 19 - 32 mEq/L 27 26 25   Calcium 8.4 - 10.5 mg/dL 9.2 8.6(L) 8.6(L)  Total Protein 6.0 - 8.3 g/dL 6.7 - -  Total Bilirubin 0.2 - 1.2 mg/dL 0.6 - -  Alkaline Phos 39 - 117 U/L 66 - -  AST 0 - 37 U/L 23 - -  ALT 0 - 35 U/L 19 - -    Past Medical History, Surgical History, Social History, Family History, Problem List, Medications, and Allergies have been reviewed and updated if relevant.  Patient Active Problem List   Diagnosis Date  Noted  . Leg wound, left, subsequent encounter 07/20/2019  . Cellulitis of left foot 11/10/2018  . Left foot pain 11/10/2018  . Anemia 10/17/2018  . First degree AV block 07/13/2016  . Counseling regarding end of life decision making 07/08/2015  . Irregular heart rate 04/09/2014  . Elevated cholesterol   . B12 deficiency 09/26/2008  . LEG CRAMPS, NOCTURNAL 09/25/2008  . URINARY TRACT INFECTION, RECURRENT 07/11/2008  . Osteoporosis 06/28/2007  . ALLERGIC RHINITIS 05/31/2007  . Osteoarthritis, hand 05/31/2007    Past Medical History:  Diagnosis Date  . Allergy   . Arthritis   . Colon polyps   . History of blood transfusion    with previous surgeries  . Hyperlipidemia   . Irregular heart rate   . PONV (postoperative nausea and vomiting)    hx of pt. states she thinks was from pain med.    Past Surgical History:  Procedure Laterality Date  . CARDIAC CATHETERIZATION    . HAND SURGERY Left   . TOTAL HIP ARTHROPLASTY  2007   LEFT  . TOTAL HIP ARTHROPLASTY Right 10/13/2016   Procedure: RIGHT TOTAL HIP ARTHROPLASTY ANTERIOR APPROACH;  Surgeon: Gaynelle Arabian, MD;  Location: [facility];  Service: Orthopedics;  Laterality: Right;  . TOTAL KNEE ARTHROPLASTY     Right   . TUBAL LIGATION      Social History   Socioeconomic History  . Marital status: Married    Spouse name: Not on file  . Number of children: 1  . Years of education: Not on file  . Highest education level: Not on file  Occupational History  . Occupation: Chartered loss adjuster    Comment: Works at [employer]  . Financial resource strain: Not on file  . Food insecurity    Worry: Not on file    Inability: Not on file  . Transportation needs    Medical: Not on file    Non-medical: Not on file  Tobacco Use  . Smoking status: Never Smoker  . Smokeless tobacco: Never Used  Substance and Sexual Activity  . Alcohol use: Yes    Alcohol/week: 3.0 standard drinks    Types: 3 Standard drinks or equivalent per  week    Comment: Occassionally  . Drug use: No  . Sexual activity: Never    Birth control/protection: Post-menopausal, Surgical  Lifestyle  . Physical activity    Days per week: Not on file    Minutes per session: Not on file  . Stress: Not on file  Relationships  . Social Herbalist on phone: Not on file    Gets together: Not on file    Attends religious service: Not on file    Active member of club or organization: Not on file    Attends meetings of clubs or organizations: Not on file    Relationship status: Not on file  . Intimate partner violence    Fear of current or ex partner: Not on file    Emotionally abused: Not on file    Physically abused: Not on file    Forced sexual activity: Not on file  Other Topics Concern  . Not on file  Social History Narrative   Regular exercise-- yes, 3-4 times a week      Diet: limited water, some fruit and veggies      Full code, has living will, HCPOA, son Kathryn [name] ( reviewed 2015)    Family History  Problem Relation Age of Onset  . Pancreatic cancer Mother   . Hypertension Sister   . Heart disease Sister   . Breast cancer Sister        Age 24's  . Heart attack Brother   . Heart disease Brother   . Hypertension Brother   . Bone cancer Sister   . Heart disease Brother   . Colon cancer Neg Hx   . Colon polyps Neg Hx   . Esophageal cancer Neg Hx   . Diabetes Neg Hx   . Kidney disease Neg Hx   . Gallbladder disease Neg Hx     Allergies  Allergen Reactions  . Macrobid [Nitrofurantoin Monohyd Macro]     Made pt sick to per stomach and shaky    Medication list reviewed and updated in full in [place].  GEN: No fevers, chills. Nontoxic. Primarily MSK c/o today. MSK: Detailed in the HPI GI: tolerating PO intake without difficulty Neuro: No numbness, parasthesias, or tingling associated. Otherwise the pertinent positives of the ROS are noted above.   Objective:   BP 130/80   Pulse 81   Temp 98  F (36.7 C) (Temporal)   Ht 5\' 1"  (1.549 m)  Wt 123 lb 4 oz (55.9 kg)   SpO2 98%   BMI 23.29 kg/m    GEN: Well-developed,well-nourished,in no acute distress; alert,appropriate and cooperative throughout examination HEENT: Normocephalic and atraumatic without obvious abnormalities. Ears, externally no deformities PULM: Breathing comfortably in no respiratory distress EXT: No clubbing, cyanosis, or edema PSYCH: Normally interactive. Cooperative during the interview. Pleasant. Friendly and conversant. Not anxious or depressed appearing. Normal, full affect.  Range of motion at  the waist: Flexion, extension, lateral bending and rotation: She has an approximate 25% loss of motion in all directions.  No echymosis or edema Rises to examination table with mild difficulty Gait: minimally antalgic  Inspection/Deformity: N Paraspinus Tenderness: Fairly diffuse, from L2-S1 bilaterally.  B Ankle Dorsiflexion (L5,4): 5/5 B Great Toe Dorsiflexion (L5,4): 5/5 Heel Walk (L5): WNL Toe Walk (S1): WNL Rise/Squat (L4): WNL, mild pain  SENSORY B Medial Foot (L4): WNL B Dorsum (L5): WNL B Lateral (S1): WNL Light Touch: WNL Pinprick: WNL  REFLEXES Knee (L4): 2+ Ankle (S1): 2+  B SLR, seated: neg B SLR, supine: neg B FABER: neg B Reverse FABER: neg B Greater Troch: NT B Log Roll: neg B Stork: NT B Sciatic Notch: NT  Radiology: No results found.  Assessment and Plan:     ICD-10-CM   1. Acute back pain, unspecified back location, unspecified back pain laterality  M54.9    Failure of conservative treatment thus far.  Basic range of motion, heat and cold as needed for pain control.  Continue Tylenol.  I am also been a give her 7 days of steroids.  Reviewed literature in [employer].  I think that is reasonable to do a short course of some steroids in this case.  If symptoms persist then it would be reasonable to have her do some outpatient physical therapy.  Follow-up: No follow-ups  on file.  Meds ordered this encounter  Medications  . DISCONTD: predniSONE (DELTASONE) 20 MG tablet    Sig: 2 tabs po daily for 4 days, then 1 tab po daily for 3 days    Dispense:  11 tablet    Refill:  0  . predniSONE (DELTASONE) 20 MG tablet    Sig: 2 tabs po for 4 days, then 1 tab po for 3 days    Dispense:  11 tablet    Refill:  0   No orders of the defined types were placed in this encounter.   Signed,  Maud Deed. Katherine Tout, MD   Outpatient Encounter Medications as of 08/13/2019  Medication Sig  . cetirizine (ZYRTEC) 10 MG tablet TAKE 1 TABLET DAILY  . diclofenac (VOLTAREN) 75 MG EC tablet TAKE 1 TABLET TWICE A DAY  . simvastatin (ZOCOR) 40 MG tablet TAKE 1 TABLET AT BEDTIME  . predniSONE (DELTASONE) 20 MG tablet 2 tabs po for 4 days, then 1 tab po for 3 days  . [DISCONTINUED] cephALEXin (KEFLEX) 500 MG capsule Take 1 capsule (500 mg total) by mouth 3 (three) times daily.  . [DISCONTINUED] predniSONE (DELTASONE) 20 MG tablet 2 tabs po daily for 4 days, then 1 tab po daily for 3 days   No facility-administered encounter medications on file as of 08/13/2019.

## 2019-08-13 ENCOUNTER — Other Ambulatory Visit: Payer: Self-pay

## 2019-08-13 ENCOUNTER — Encounter: Payer: Self-pay | Admitting: Family Medicine

## 2019-08-13 ENCOUNTER — Ambulatory Visit (INDEPENDENT_AMBULATORY_CARE_PROVIDER_SITE_OTHER): Payer: Medicare Other | Admitting: Family Medicine

## 2019-08-13 VITALS — BP 130/80 | HR 81 | Temp 98.0°F | Ht 61.0 in | Wt 123.2 lb

## 2019-08-13 DIAGNOSIS — M549 Dorsalgia, unspecified: Secondary | ICD-10-CM

## 2019-08-13 MED ORDER — PREDNISONE 20 MG PO TABS: ORAL_TABLET | ORAL | 0 refills | Status: DC

## 2019-08-13 NOTE — Patient Instructions (Signed)
After you finish your prednisone,   Start taking over the counter iburprofen 200 mg tablets, 1 tablet twice a day  You can take this along with Tylenol.

## 2019-08-21 DIAGNOSIS — Z803 Family history of malignant neoplasm of breast: Secondary | ICD-10-CM | POA: Diagnosis not present

## 2019-08-21 DIAGNOSIS — Z1231 Encounter for screening mammogram for malignant neoplasm of breast: Secondary | ICD-10-CM | POA: Diagnosis not present

## 2019-08-21 LAB — HM MAMMOGRAPHY

## 2019-08-28 ENCOUNTER — Other Ambulatory Visit: Payer: Self-pay

## 2019-08-28 ENCOUNTER — Encounter (HOSPITAL_BASED_OUTPATIENT_CLINIC_OR_DEPARTMENT_OTHER): Payer: Medicare Other | Attending: Internal Medicine | Admitting: Internal Medicine

## 2019-08-28 DIAGNOSIS — I87312 Chronic venous hypertension (idiopathic) with ulcer of left lower extremity: Secondary | ICD-10-CM | POA: Diagnosis not present

## 2019-08-28 DIAGNOSIS — I87303 Chronic venous hypertension (idiopathic) without complications of bilateral lower extremity: Secondary | ICD-10-CM | POA: Diagnosis present

## 2019-08-28 DIAGNOSIS — L97222 Non-pressure chronic ulcer of left calf with fat layer exposed: Secondary | ICD-10-CM | POA: Diagnosis not present

## 2019-08-29 NOTE — Progress Notes (Signed)
Kathryn, [name] ([id number]) Visit Report for 08/28/2019 Abuse/Suicide Risk Screen Details Patient Name: Date of Service: Kathryn [name], Kathryn [name] 08/28/2019 10:30 AM Medical Record [id number] Patient Account Number: [id number] Date of Birth/Sex: Treating RN: 13-Mar-1931 (85 y.o. [name] Primary Care Eternity Dexter: Eliezer Lofts Other Clinician: Referring Raywood Wailes: Treating Elya Tarquinio/Extender:Robson, [name], [name] in Treatment: 0 Abuse/Suicide Risk Screen Items Answer ABUSE RISK SCREEN: Has anyone close to you tried to hurt or harm you recentlyo No Do you feel uncomfortable with anyone in your familyo No Has anyone forced you do things that you didnt want to doo No Electronic Signature(s) Signed: 08/29/2019 6:22:02 PM By: Baruch Gouty RN, BSN Entered By: Baruch Gouty on 08/28/2019 11:45:58 -------------------------------------------------------------------------------- Activities of Daily Living Details Patient Name: Date of Service: Kathryn, [name] 08/28/2019 10:30 AM Medical Record [id number] Patient Account Number: [id number] Date of Birth/Sex: Treating RN: 04/10/1931 (85 y.o. [name] Primary Care Lizeth Bencosme: Eliezer Lofts Other Clinician: Referring Kehaulani Fruin: Treating Lewi Drost/Extender:Robson, [name], [name] in Treatment: 0 Activities of Daily Living Items Answer Activities of Daily Living (Please select one for each item) Drive Automobile Completely Able Take Medications Completely Able Use Telephone Completely Able Care for Appearance Completely Able Use Toilet Completely Able Bath / Shower Completely Able Dress Self Completely Able Feed Self Completely Able Walk Completely Able Get In / Out Bed Completely Able Housework Completely Able Prepare Meals Completely Able Handle Money Completely Able Shop for Self Completely Able Electronic Signature(s) Signed: 08/29/2019 6:22:02 PM By: Baruch Gouty RN, BSN Entered By:  Baruch Gouty on 08/28/2019 11:46:20 -------------------------------------------------------------------------------- Education Screening Details Patient Name: Date of Service: Kathryn [name] 08/28/2019 10:30 AM Medical Record [id number] Patient Account Number: [id number] Date of Birth/Sex: Treating RN: 1930-09-29 (85 y.o. [name] Primary Care Jemel Ono: Eliezer Lofts Other Clinician: Referring Mikle Sternberg: Treating Tymothy Cass/Extender:Robson, [name], [name] in Treatment: 0 Primary Learner Assessed: Patient Learning Preferences/Education Level/Primary Language Learning Preference: Explanation, Demonstration, Printed Material Highest Education Level: High School Preferred Language: English Cognitive Barrier Language Barrier: No Translator Needed: No Memory Deficit: No Emotional Barrier: No Cultural/Religious Beliefs Affecting Medical Care: No Physical Barrier Impaired Vision: Yes Contacts Impaired Hearing: No Decreased Hand dexterity: Yes Limitations: some finger contractures Knowledge/Comprehension Knowledge Level: High Comprehension Level: High Ability to understand written High instructions: Ability to understand verbal High instructions: Motivation Anxiety Level: Calm Cooperation: Cooperative Education Importance: Acknowledges Need Interest in Health Problems: Asks Questions Perception: Coherent Willingness to Engage in Self- High Management Activities: Readiness to Engage in Self- High Management Activities: Electronic Signature(s) Signed: 08/29/2019 6:22:02 PM By: Baruch Gouty RN, BSN Entered By: Baruch Gouty on 08/28/2019 11:47:28 -------------------------------------------------------------------------------- Fall Risk Assessment Details Patient Name: Date of Service: Kathryn [name] 08/28/2019 10:30 AM Medical Record [id number] Patient Account Number: [id number] Date of Birth/Sex: Treating RN: 1930/12/04 (85 y.o. [name] Primary Care Cornelius Marullo: Eliezer Lofts Other Clinician: Referring Katrell Milhorn: Treating Jourdain Guay/Extender:Robson, [name], [name] in Treatment: 0 Fall Risk Assessment Items Have you had 2 or more falls in the last 12 monthso 0 No Have you had any fall that resulted in injury in the last 12 monthso 0 No FALLS RISK SCREEN History of falling - immediate or within 3 months 0 No Secondary diagnosis (Do you have 2 or more medical diagnoseso) 0 No Ambulatory aid None/bed rest/wheelchair/nurse 0 Yes Crutches/cane/walker 0 No Furniture 0 No Intravenous therapy Access/Saline/Heparin Lock 0 No Weak (short steps with or without shuffle, stooped but able to lift head 0 No while walking, may seek  support from furniture) Impaired (short steps with shuffle, may have difficulty arising from chair, 0 No head down, impaired balance) Mental Status Oriented to own ability 0 Yes Overestimates or forgets limitations 0 No Risk Level: Low Risk Score: 0 Electronic Signature(s) Signed: 08/29/2019 6:22:02 PM By: Baruch Gouty RN, BSN Entered By: Baruch Gouty on 08/28/2019 11:48:03 -------------------------------------------------------------------------------- Foot Assessment Details Patient Name: Date of Service: Kathryn [name] 08/28/2019 10:30 AM Medical Record [id number] Patient Account Number: [id number] Date of Birth/Sex: Treating RN: 1931/07/16 (85 y.o. [name] Primary Care Maha Fischel: Eliezer Lofts Other Clinician: Referring Lutie Pickler: Treating Anysa Tacey/Extender:Robson, [name], [name] in Treatment: 0 Foot Assessment Items Site Locations + = Sensation present, - = Sensation absent, C = Callus, U = Ulcer R = Redness, W = Warmth, M = Maceration, PU = Pre-ulcerative lesion F = Fissure, S = Swelling, D = Dryness Assessment Right: Left: Other Deformity: No No Prior Foot Ulcer: No No Prior Amputation: No No Charcot Joint: No No Ambulatory  Status: Ambulatory Without Help Gait: Steady Electronic Signature(s) Signed: 08/29/2019 6:22:02 PM By: Baruch Gouty RN, BSN Entered By: Baruch Gouty on 08/28/2019 11:49:25 -------------------------------------------------------------------------------- Nutrition Risk Screening Details Patient Name: Date of Service: DAVAE, NORGREN 08/28/2019 10:30 AM Medical Record [id number] Patient Account Number: [id number] Date of Birth/Sex: Treating RN: [birth year] (85 y.o. [name] Primary Care Kamarie Palma: Eliezer Lofts Other Clinician: Referring Anthone Prieur: Treating Monseratt Ledin/Extender:Robson, [name], [name] in Treatment: 0 Height (in): 67 Weight (lbs): 125 Body Mass Index (BMI): 19.6 Nutrition Risk Screening Items Score Screening NUTRITION RISK SCREEN: I have an illness or condition that made me change the kind and/or 0 No amount of food I eat I eat fewer than two meals per day 0 No I eat few fruits and vegetables, or milk products 0 No I have three or more drinks of beer, liquor or wine almost every day 0 No I have tooth or mouth problems that make it hard for me to eat 0 No I don't always have enough money to buy the food I need 0 No I eat alone most of the time 0 No I take three or more different prescribed or over-the-counter drugs a day 1 Yes 0 No Without wanting to, I have lost or gained 10 pounds in the last six months I am not always physically able to shop, cook and/or feed myself 0 No Nutrition Protocols Good Risk Protocol 0 No interventions needed Moderate Risk Protocol High Risk Proctocol Risk Level: Good Risk Score: 1 Electronic Signature(s) Signed: 08/29/2019 6:22:02 PM By: Baruch Gouty RN, BSN Entered By: Baruch Gouty on 08/28/2019 11:48:56

## 2019-08-30 ENCOUNTER — Encounter: Payer: Self-pay | Admitting: Family Medicine

## 2019-09-04 ENCOUNTER — Encounter (HOSPITAL_BASED_OUTPATIENT_CLINIC_OR_DEPARTMENT_OTHER): Payer: Medicare Other | Admitting: Internal Medicine

## 2019-09-04 ENCOUNTER — Other Ambulatory Visit: Payer: Self-pay

## 2019-09-04 DIAGNOSIS — L820 Inflamed seborrheic keratosis: Secondary | ICD-10-CM | POA: Diagnosis not present

## 2019-09-04 DIAGNOSIS — Z23 Encounter for immunization: Secondary | ICD-10-CM | POA: Diagnosis not present

## 2019-09-04 DIAGNOSIS — D485 Neoplasm of uncertain behavior of skin: Secondary | ICD-10-CM | POA: Diagnosis not present

## 2019-09-04 DIAGNOSIS — I87312 Chronic venous hypertension (idiopathic) with ulcer of left lower extremity: Secondary | ICD-10-CM | POA: Diagnosis not present

## 2019-09-04 DIAGNOSIS — I87303 Chronic venous hypertension (idiopathic) without complications of bilateral lower extremity: Secondary | ICD-10-CM | POA: Diagnosis not present

## 2019-09-04 DIAGNOSIS — L97222 Non-pressure chronic ulcer of left calf with fat layer exposed: Secondary | ICD-10-CM | POA: Diagnosis not present

## 2019-09-04 NOTE — Progress Notes (Signed)
Kathryn [name], Kathryn [name] ([id number]) Visit Report for 08/28/2019 Chief Complaint Document Details Patient Name: Date of Service: Kathryn [name], Kathryn [name] 08/28/2019 10:30 AM Medical Record [id number] Patient Account Number: [id number] Date of Birth/Sex: Treating RN: 24-Feb-1931 (85 y.o. F) Primary Care Provider: Eliezer Lofts Other Clinician: Referring Provider: Treating Provider/Extender:, [name], [name] in Treatment: 0 Information Obtained from: Patient Chief Complaint 08/28/2019; patient is here for review of a wound on her left medial calf Electronic Signature(s) Signed: 08/28/2019 6:45:39 PM By: Linton Ham MD Entered By: Linton Ham on 08/28/2019 12:28:14 -------------------------------------------------------------------------------- Debridement Details Patient Name: Date of Service: Kathryn [name] 08/28/2019 10:30 AM Medical Record [id number] Patient Account Number: [id number] Date of Birth/Sex: Treating RN: 01/02/1931 (85 y.o. F) Primary Care Provider: Eliezer Lofts Other Clinician: Referring Provider: Treating Provider/Extender:, [name], [name] in Treatment: 0 Debridement Performed for Wound #1 Left,Medial Lower Leg Assessment: Performed By: Physician Ricard Dillon., MD Debridement Type: Debridement Severity of Tissue Pre Fat layer exposed Debridement: Level of Consciousness (Pre- Awake and Alert procedure): Pre-procedure Verification/Time Out Taken: Yes - 12:17 Start Time: 12:17 Pain Control: Lidocaine 5% topical ointment Total Area Debrided (L x W): 5.3 (cm) x 2.4 (cm) = 12.72 (cm) Tissue and other material Viable, Non-Viable, Slough, Subcutaneous, Slough debrided: Level: Skin/Subcutaneous Tissue Debridement Description: Excisional Instrument: Curette Bleeding: Moderate Hemostasis Achieved: Pressure End Time: 12:19 Procedural Pain: 3 Post Procedural Pain: 0 Response to Treatment: Procedure was tolerated well Level  of Consciousness Awake and Alert (Post-procedure): Post Debridement Measurements of Total Wound Length: (cm) 5.3 Width: (cm) 2.4 Depth: (cm) 0.3 Volume: (cm) 2.997 Character of Wound/Ulcer Post Improved Debridement: Severity of Tissue Post Debridement: Fat layer exposed Post Procedure Diagnosis Same as Pre-procedure Electronic Signature(s) Signed: 08/28/2019 6:45:39 PM By: Linton Ham MD Entered By: Linton Ham on 08/28/2019 12:27:57 -------------------------------------------------------------------------------- HPI Details Patient Name: Date of Service: Kathryn [name] 08/28/2019 10:30 AM Medical Record [id number] Patient Account Number: [id number] Date of Birth/Sex: Treating RN: 10-31-1930 (85 y.o. F) Primary Care Provider: Eliezer Lofts Other Clinician: Referring Provider: Treating Provider/Extender:, [name], [name] in Treatment: 0 History of Present Illness HPI Description: ADMISSION 08/28/19 This is an 85 year old woman who is still very active still working full-time at [employer] Sometime in late September her left calf was hit by the harness of her dog who was chasing another person. She was left with a fairly clear skin tear she tried to pull the skin back over the wound but it it did not maintain. She was seen in her primary doctor's office on 07/20/2019 she was given a topical antibiotic and cephalexin. She was reviewed in primary care on 11/3 and it was felt the wound might need debridement and she was sent here. The patient has chronic venous insufficiency however she does not have a history of recurrent chronic wounds. Past medical history includes cellulitis of the left foot, hyperlipidemia, irregular heart rate, colonic polyps., Low back pain, B12 deficiency, Dupuytren's contractures, recurrent UTIs she has a history of bilateral hip surgery for osteoarthritis ABI in our clinic was 0.94 on the left Electronic Signature(s) Signed:  08/28/2019 6:45:39 PM By: Linton Ham MD Entered By: Linton Ham on 08/28/2019 13:01:13 -------------------------------------------------------------------------------- Physical Exam Details Patient Name: Date of Service: Kathryn [name] 08/28/2019 10:30 AM Medical Record [id number] Patient Account Number: [id number] Date of Birth/Sex: Treating RN: 07-Nov-1930 (85 y.o. F) Primary Care Provider: Eliezer Lofts Other Clinician: Referring Provider: Treating Provider/Extender:, [name], [name] in Treatment: 0 Constitutional Patient is hypertensive.. Pulse regular and  within target range for patient.Marland Kitchen Respirations regular, non-labored and within target range.. Temperature is normal and within the target range for the patient.Marland Kitchen Appears in no distress. Eyes Conjunctivae clear. No discharge.no icterus. Respiratory work of breathing is normal. Bilateral breath sounds are clear and equal in all lobes with no wheezes, rales or rhonchi.. Cardiovascular Somewhat irregular heart rate. Blowing systolic murmur at the lower left sternal border. She appears to be euvolemic. Femoral pulse and popliteal pulses palpable on the left. Pedal pulses palpable and strong bilaterally. Patient has some evidence of chronic venous insufficiency varicosities hemosiderin deposition. Integumentary (Hair, Skin) Skin changes of chronic venous insufficiency in the left lower leg. Psychiatric appears at normal baseline. Notes Wound exam; this is on the left medial calf. Leg fairly large wound. Necrotic surface. Debrided with a #5 curette I am able to get this to clean up fairly well however further debridement is going to be necessary. Hemostasis with direct pressure there is no evidence of surrounding infection Electronic Signature(s) Signed: 08/28/2019 6:45:39 PM By: Linton Ham MD Entered By: Linton Ham on 08/28/2019  13:05:38 -------------------------------------------------------------------------------- Physician Orders Details Patient Name: Date of Service: Kathryn [name] 08/28/2019 10:30 AM Medical Record [id number] Patient Account Number: [id number] Date of Birth/Sex: Treating RN: 1931/03/18 (85 y.o. Orvan Falconer Primary Care Provider: Eliezer Lofts Other Clinician: Referring Provider: Treating Provider/Extender:, [name], [name] in Treatment: 0 Verbal / Phone Orders: No Diagnosis Coding Follow-up Appointments Return Appointment in 1 week. Dressing Change Frequency Do not change entire dressing for one week. Skin Barriers/Peri-Wound Care Wound #1 Left,Medial Lower Leg Barrier cream Wound Cleansing Wound #1 Left,Medial Lower Leg May shower with protection. Primary Wound Dressing Wound #1 Left,Medial Lower Leg Iodoflex Secondary Dressing Wound #1 Left,Medial Lower Leg Dry Gauze ABD pad Edema Control 3 Layer Compression System - Left Lower Extremity Electronic Signature(s) Signed: 08/28/2019 6:45:39 PM By: Linton Ham MD Signed: 09/04/2019 2:56:39 PM By: Carlene Coria RN Entered By: Carlene Coria on 08/28/2019 12:21:50 -------------------------------------------------------------------------------- Problem List Details Patient Name: Date of Service: Kathryn [name] 08/28/2019 10:30 AM Medical Record [id number] Patient Account Number: [id number] Date of Birth/Sex: Treating RN: Jan 26, 1931 (85 y.o. F) Primary Care Provider: Eliezer Lofts Other Clinician: Referring Provider: Treating Provider/Extender:, [name], [name] in Treatment: 0 Active Problems ICD-10 Evaluated Encounter Code Description Active Date Today Diagnosis S81.812D Laceration without foreign body, left lower leg, 08/28/2019 No Yes subsequent encounter L97.222 Non-pressure chronic ulcer of left calf with fat layer 08/28/2019 No Yes exposed I87.303 Chronic venous  hypertension (idiopathic) without [id number] No Yes complications of bilateral lower extremity Inactive Problems Resolved Problems Electronic Signature(s) Signed: 08/28/2019 6:45:39 PM By: Linton Ham MD Entered By: Linton Ham on 08/28/2019 12:27:39 -------------------------------------------------------------------------------- Progress Note Details Patient Name: Date of Service: Kathryn [name] 08/28/2019 10:30 AM Medical Record [id number] Patient Account Number: [id number] Date of Birth/Sex: Treating RN: 11-29-1930 (85 y.o. F) Primary Care Provider: Eliezer Lofts Other Clinician: Referring Provider: Treating Provider/Extender:, [name], [name] in Treatment: 0 Subjective Chief Complaint Information obtained from Patient 08/28/2019; patient is here for review of a wound on her left medial calf History of Present Illness (HPI) ADMISSION 08/28/19 This is an 85 year old woman who is still very active still working full-time at [employer] Sometime in late September her left calf was hit by the harness of her dog who was chasing another person. She was left with a fairly clear skin tear she tried to pull the skin back over the wound but it it did not maintain.  She was seen in her primary doctor's office on 07/20/2019 she was given a topical antibiotic and cephalexin. She was reviewed in primary care on 11/3 and it was felt the wound might need debridement and she was sent here. The patient has chronic venous insufficiency however she does not have a history of recurrent chronic wounds. Past medical history includes cellulitis of the left foot, hyperlipidemia, irregular heart rate, colonic polyps., Low back pain, B12 deficiency, Dupuytren's contractures, recurrent UTIs she has a history of bilateral hip surgery for osteoarthritis ABI in our clinic was 0.94 on the left Patient History Information obtained from Patient. Allergies Macrobid (Reaction: shake all  over) Family History Cancer - Siblings, Heart Disease - Siblings, Hypertension - Siblings, No family history of Diabetes, Hereditary Spherocytosis, Kidney Disease, Lung Disease, Seizures, Stroke, Thyroid Problems, Tuberculosis. Social History Never smoker, Marital Status - Married, Alcohol Use - Moderate, Drug Use - No History, Caffeine Use - Daily - coffee. Medical History Eyes Patient has history of Cataracts - removed both eyes Integumentary (Skin) Denies history of History of Burn Musculoskeletal Patient has history of Osteoarthritis Psychiatric Patient has history of Confinement Anxiety - mild Denies history of Anorexia/bulimia Hospitalization/Surgery History - bil hip replacements. - right knee replacement. Medical And Surgical History Notes Cardiovascular hyperlipidemia Review of Systems (ROS) Constitutional Symptoms (General Health) Denies complaints or symptoms of Fatigue, Fever, Chills, Marked Weight Change. Eyes Complains or has symptoms of Glasses / Contacts - contacts. Ear/Nose/Mouth/Throat Denies complaints or symptoms of Chronic sinus problems or rhinitis. Respiratory Denies complaints or symptoms of Chronic or frequent coughs, Shortness of Breath. Cardiovascular Denies complaints or symptoms of Chest pain. Gastrointestinal Denies complaints or symptoms of Frequent diarrhea, Nausea, Vomiting. Endocrine Denies complaints or symptoms of Heat/cold intolerance. Genitourinary Denies complaints or symptoms of Frequent urination. Integumentary (Skin) Complains or has symptoms of Wounds - left lower leg. Musculoskeletal Denies complaints or symptoms of Muscle Pain, Muscle Weakness. Neurologic Denies complaints or symptoms of Numbness/parasthesias. Psychiatric Denies complaints or symptoms of Claustrophobia, Suicidal. Objective Constitutional Patient is hypertensive.. Pulse regular and within target range for patient.Marland Kitchen Respirations regular, non-labored  and within target range.. Temperature is normal and within the target range for the patient.Marland Kitchen Appears in no distress. Vitals Time Taken: 11:37 AM, Height: 67 in, Source: Stated, Weight: 125 lbs, Source: Stated, BMI: 19.6, Temperature: 98.1 F, Pulse: 74 bpm, Respiratory Rate: 18 breaths/min, Blood Pressure: 161/69 mmHg. Eyes Conjunctivae clear. No discharge.no icterus. Respiratory work of breathing is normal. Bilateral breath sounds are clear and equal in all lobes with no wheezes, rales or rhonchi.. Cardiovascular Somewhat irregular heart rate. Blowing systolic murmur at the lower left sternal border. She appears to be euvolemic. Femoral pulse and popliteal pulses palpable on the left. Pedal pulses palpable and strong bilaterally. Patient has some evidence of chronic venous insufficiency varicosities hemosiderin deposition. Psychiatric appears at normal baseline. General Notes: Wound exam; this is on the left medial calf. Leg fairly large wound. Necrotic surface. Debrided with a #5 curette I am able to get this to clean up fairly well however further debridement is going to be necessary. Hemostasis with direct pressure there is no evidence of surrounding infection Integumentary (Hair, Skin) Skin changes of chronic venous insufficiency in the left lower leg. Wound #1 status is Open. Original cause of wound was Trauma. The wound is located on the Left,Medial Lower Leg. The wound measures 5.3cm length x 2.4cm width x 0.3cm depth; 9.99cm^2 area and 2.997cm^3 volume. There is Fat Layer (Subcutaneous Tissue) Exposed exposed.  There is no tunneling or undermining noted. There is a medium amount of serosanguineous drainage noted. The wound margin is flat and intact. There is small (1-33%) red granulation within the wound bed. There is a large (67-100%) amount of necrotic tissue within the wound bed including Adherent Slough. Assessment Active Problems ICD-10 Laceration without foreign body,  left lower leg, subsequent encounter Non-pressure chronic ulcer of left calf with fat layer exposed Chronic venous hypertension (idiopathic) without complications of bilateral lower extremity Procedures Wound #1 Pre-procedure diagnosis of Wound #1 is a Venous Leg Ulcer located on the Left,Medial Lower Leg .Severity of Tissue Pre Debridement is: Fat layer exposed. There was a Excisional Skin/Subcutaneous Tissue Debridement with a total area of 12.72 sq cm performed by Ricard Dillon., MD. With the following instrument(s): Curette to remove Viable and Non-Viable tissue/material. Material removed includes Subcutaneous Tissue and Slough and after achieving pain control using Lidocaine 5% topical ointment. No specimens were taken. A time out was conducted at 12:17, prior to the start of the procedure. A Moderate amount of bleeding was controlled with Pressure. The procedure was tolerated well with a pain level of 3 throughout and a pain level of 0 following the procedure. Post Debridement Measurements: 5.3cm length x 2.4cm width x 0.3cm depth; 2.997cm^3 volume. Character of Wound/Ulcer Post Debridement is improved. Severity of Tissue Post Debridement is: Fat layer exposed. Post procedure Diagnosis Wound #1: Same as Pre-Procedure Pre-procedure diagnosis of Wound #1 is a Venous Leg Ulcer located on the Left,Medial Lower Leg . There was a Three Layer Compression Therapy Procedure by Carlene Coria, RN. Post procedure Diagnosis Wound #1: Same as Pre-Procedure Plan Follow-up Appointments: Return Appointment in 1 week. Dressing Change Frequency: Do not change entire dressing for one week. Skin Barriers/Peri-Wound Care: Wound #1 Left,Medial Lower Leg: Barrier cream Wound Cleansing: Wound #1 Left,Medial Lower Leg: May shower with protection. Primary Wound Dressing: Wound #1 Left,Medial Lower Leg: Iodoflex Secondary Dressing: Wound #1 Left,Medial Lower Leg: Dry Gauze ABD pad Edema Control: 3  Layer Compression System - Left Lower Extremity 1. Use Iodoflex as the primary dressing under compression 2. May ultimately require a skin substitute i.e. Apligraf we will see how this progresses 3. The patient has some degree of chronic venous insufficiency and edema in the lower leg which is nonpitting. I elected to put this under compression with the idea of leaving this on all week 4. No current evidence of infection no additional antibiotics are required Electronic Signature(s) Signed: 08/28/2019 6:45:39 PM By: Linton Ham MD Entered By: Linton Ham on 08/28/2019 13:08:36 -------------------------------------------------------------------------------- HxROS Details Patient Name: Date of Service: Kathryn [name]. 08/28/2019 10:30 AM Medical Record [id number] Patient Account Number: [id number] Date of Birth/Sex: Treating RN: 30-Aug-1931 (85 y.o. [name] Primary Care Provider: Eliezer Lofts Other Clinician: Referring Provider: Treating Provider/Extender:, [name], [name] in Treatment: 0 Information Obtained From Patient Constitutional Symptoms (General Health) Complaints and Symptoms: Negative for: Fatigue; Fever; Chills; Marked Weight Change Eyes Complaints and Symptoms: Positive for: Glasses / Contacts - contacts Medical History: Positive for: Cataracts - removed both eyes Ear/Nose/Mouth/Throat Complaints and Symptoms: Negative for: Chronic sinus problems or rhinitis Respiratory Complaints and Symptoms: Negative for: Chronic or frequent coughs; Shortness of Breath Cardiovascular Complaints and Symptoms: Negative for: Chest pain Medical History: Past Medical History Notes: hyperlipidemia Gastrointestinal Complaints and Symptoms: Negative for: Frequent diarrhea; Nausea; Vomiting Endocrine Complaints and Symptoms: Negative for: Heat/cold intolerance Genitourinary Complaints and Symptoms: Negative for: Frequent  urination Integumentary (Skin) Complaints and Symptoms:  Positive for: Wounds - left lower leg Medical History: Negative for: History of Burn Musculoskeletal Complaints and Symptoms: Negative for: Muscle Pain; Muscle Weakness Medical History: Positive for: Osteoarthritis Neurologic Complaints and Symptoms: Negative for: Numbness/parasthesias Psychiatric Complaints and Symptoms: Negative for: Claustrophobia; Suicidal Medical History: Positive for: Confinement Anxiety - mild Negative for: Anorexia/bulimia Hematologic/Lymphatic Immunological Oncologic HBO Extended History Items Eyes: Cataracts Immunizations Pneumococcal Vaccine: Received Pneumococcal Vaccination: Yes Implantable Devices Yes Hospitalization / Surgery History Type of Hospitalization/Surgery bil hip replacements right knee replacement Family and Social History Cancer: Yes - Siblings; Diabetes: No; Heart Disease: Yes - Siblings; Hereditary Spherocytosis: No; Hypertension: Yes - Siblings; Kidney Disease: No; Lung Disease: No; Seizures: No; Stroke: No; Thyroid Problems: No; Tuberculosis: No; Never smoker; Marital Status - Married; Alcohol Use: Moderate; Drug Use: No History; Caffeine Use: Daily - coffee; Financial Concerns: No; Food, Clothing or Shelter Needs: No; Support System Lacking: No; Transportation Concerns: No Electronic Signature(s) Signed: 08/28/2019 6:45:39 PM By: Linton Ham MD Signed: 08/29/2019 6:22:02 PM By: Baruch Gouty RN, BSN Entered By: Baruch Gouty on 08/28/2019 12:03:14 -------------------------------------------------------------------------------- SuperBill Details Patient Name: Date of Service: Kathryn [name] 08/28/2019 Medical Record [id number] Patient Account Number: [id number] Date of Birth/Sex: Treating RN: 12-13-1930 (85 y.o. F) Primary Care Provider: Eliezer Lofts Other Clinician: Referring Provider: Treating Provider/Extender:, [name], [name]  in Treatment: 0 Diagnosis Coding ICD-10 Codes Code Description S81.812D Laceration without foreign body, left lower leg, subsequent encounter L97.222 Non-pressure chronic ulcer of left calf with fat layer exposed I87.303 Chronic venous hypertension (idiopathic) without complications of bilateral lower extremity Facility Procedures CPT4 Code: [id number] Description: [place] VISIT-LEV 3 EST PT Modifier: 25 Quantity: 1 CPT4 Code: [id number] Description: [id number] - DEB SUBQ TISSUE 20 SQ CM/< ICD-10 Diagnosis Description L97.222 Non-pressure chronic ulcer of left calf with fat layer ex Modifier: posed Quantity: 1 Physician Procedures CPT4 Code Description: [id number] WC PHYS LEVEL 3 NEW PT ICD-10 Diagnosis Description S81.812D Laceration without foreign body, left lower leg, subsequ L97.222 Non-pressure chronic ulcer of left calf with fat layer e I87.303 Chronic venous hypertension  (idiopathic) without complic extremity Modifier: 25 ent encounter xposed ations of bilat Quantity: 1 eral lower CPT4 Code Description: [id number] - WC PHYS SUBQ TISS 20 SQ CM ICD-10 Diagnosis Description L97.222 Non-pressure chronic ulcer of left calf with fat layer expo Modifier: sed Quantity: 1 Electronic Signature(s) Signed: 08/28/2019 6:45:39 PM By: Linton Ham MD Signed: 09/04/2019 2:56:39 PM By: Carlene Coria RN Entered By: Carlene Coria on 08/28/2019 13:24:50

## 2019-09-05 NOTE — Progress Notes (Addendum)
BELLAH, SCHOLLENBERGER ([id number]) Visit Report for 09/04/2019 Debridement Details Patient Name: Date of Service: Kathryn [name], HOPPMAN 09/04/2019 1:00 PM Medical Record [id number] Patient Account Number: [id number] Date of Birth/Sex: Treating RN: 01/05/1931 (85 y.o. Orvan Falconer Primary Care Provider: Eliezer Lofts Other Clinician: Referring Provider: Treating Provider/Extender:Emyah Roznowski, [name], [name] in Treatment: 1 Debridement Performed for Wound #1 Left,Medial Lower Leg Assessment: Performed By: Physician Ricard Dillon., MD Debridement Type: Debridement Severity of Tissue Pre Fat layer exposed Debridement: Level of Consciousness (Pre- Awake and Alert procedure): Pre-procedure Verification/Time Out Taken: Yes - 15:14 Start Time: 15:14 Pain Control: Other : benzocaine 20% Total Area Debrided (L x W): 5.4 (cm) x 2.6 (cm) = 14.04 (cm) Tissue and other material Viable, Non-Viable, Slough, Subcutaneous, Skin: Dermis , Skin: Epidermis, Slough debrided: Level: Skin/Subcutaneous Tissue Debridement Description: Excisional Instrument: Curette Bleeding: Minimum Hemostasis Achieved: Pressure End Time: 15:18 Procedural Pain: 3 Post Procedural Pain: 0 Response to Treatment: Procedure was tolerated well Level of Consciousness Awake and Alert (Post-procedure): Post Debridement Measurements of Total Wound Length: (cm) 5.4 Width: (cm) 2.6 Depth: (cm) 0.3 Volume: (cm) 3.308 Character of Wound/Ulcer Post Improved Debridement: Severity of Tissue Post Debridement: Fat layer exposed Post Procedure Diagnosis Same as Pre-procedure Electronic Signature(s) Signed: 09/04/2019 5:53:37 PM By: Linton Ham MD Signed: 09/05/2019 12:07:41 PM By: Carlene Coria RN Entered By: Linton Ham on 09/04/2019 15:30:49 -------------------------------------------------------------------------------- HPI Details Patient Name: Date of Service: Kathryn [name] 09/04/2019 1:00 PM Medical  Record [id number] Patient Account Number: [id number] Date of Birth/Sex: Treating RN: 03-Feb-1931 (85 y.o. Orvan Falconer Primary Care Provider: Eliezer Lofts Other Clinician: Referring Provider: Treating Provider/Extender:Rocko Fesperman, [name], [name] in Treatment: 1 History of Present Illness HPI Description: ADMISSION 08/28/19 This is an 84 year old woman who is still very active still working full-time at [employer] Sometime in late September her left calf was hit by the harness of her dog who was chasing another person. She was left with a fairly clear skin tear she tried to pull the skin back over the wound but it it did not maintain. She was seen in her primary doctor's office on 07/20/2019 she was given a topical antibiotic and cephalexin. She was reviewed in primary care on 11/3 and it was felt the wound might need debridement and she was sent here. The patient has chronic venous insufficiency however she does not have a history of recurrent chronic wounds. Past medical history includes cellulitis of the left foot, hyperlipidemia, irregular heart rate, colonic polyps., Low back pain, B12 deficiency, Dupuytren's contractures, recurrent UTIs she has a history of bilateral hip surgery for osteoarthritis ABI in our clinic was 0.94 on the left 12/8; deep laceration wound on the left medial mid tibia. We are using Iodoflex under compression. She is not eligible for home health Electronic Signature(s) Signed: 09/04/2019 5:53:37 PM By: Linton Ham MD Entered By: Linton Ham on 09/04/2019 15:31:38 -------------------------------------------------------------------------------- Physical Exam Details Patient Name: Date of Service: Kathryn [name] 09/04/2019 1:00 PM Medical Record [id number] Patient Account Number: [id number] Date of Birth/Sex: Treating RN: 08-11-1931 (85 y.o. Orvan Falconer Primary Care Provider: Eliezer Lofts Other Clinician: Referring Provider:  Treating Provider/Extender:Aleeza [place], [name], [name] in Treatment: 1 Constitutional Patient is hypertensive.. Pulse regular and within target range for patient.Marland Kitchen Respirations regular, non-labored and within target range.. Temperature is normal and within the target range for the patient.Marland Kitchen Appears in no distress. Notes Wound exam; this is on the left medial calf. Fairly large wound. Necrotic surface but somewhat better  than last week. Still requiring a reasonably aggressive debridement removing adherent necrotic tissue from the wound bed. This cleans up marginally. There is no evidence of surrounding infection Electronic Signature(s) Signed: 09/04/2019 5:53:37 PM By: Linton Ham MD Entered By: Linton Ham on 09/04/2019 15:33:05 -------------------------------------------------------------------------------- Physician Orders Details Patient Name: Date of Service: Kathryn [name] 09/04/2019 1:00 PM Medical Record [id number] Patient Account Number: [id number] Date of Birth/Sex: Treating RN: 08/22/1931 (85 y.o. Orvan Falconer Primary Care Provider: Eliezer Lofts Other Clinician: Referring Provider: Treating Provider/Extender:Parnell Spieler, [name], [name] in Treatment: 1 Verbal / Phone Orders: No Diagnosis Coding ICD-10 Coding Code Description S81.812D Laceration without foreign body, left lower leg, subsequent encounter L97.222 Non-pressure chronic ulcer of left calf with fat layer exposed I87.303 Chronic venous hypertension (idiopathic) without complications of bilateral lower extremity Follow-up Appointments Return Appointment in 1 week. Dressing Change Frequency Do not change entire dressing for one week. Skin Barriers/Peri-Wound Care Wound #1 Left,Medial Lower Leg Barrier cream Wound Cleansing Wound #1 Left,Medial Lower Leg May shower with protection. Primary Wound Dressing Wound #1 Left,Medial Lower Leg Iodoflex Secondary Dressing Wound #1  Left,Medial Lower Leg Dry Gauze ABD pad Edema Control 3 Layer Compression System - Left Lower Extremity Electronic Signature(s) Signed: 09/04/2019 2:52:36 PM By: Carlene Coria RN Signed: 09/04/2019 5:53:37 PM By: Linton Ham MD Entered By: Carlene Coria on 09/04/2019 14:10:26 -------------------------------------------------------------------------------- Problem List Details Patient Name: Date of Service: Kathryn [name] 09/04/2019 1:00 PM Medical Record [id number] Patient Account Number: [id number] Date of Birth/Sex: Treating RN: Nov 06, 1930 (85 y.o. Orvan Falconer Primary Care Provider: Eliezer Lofts Other Clinician: Referring Provider: Treating Provider/Extender:Thayne Cindric, [name], [name] in Treatment: 1 Active Problems ICD-10 Evaluated Encounter Code Description Active Date Today Diagnosis I87.303 Chronic venous hypertension (idiopathic) without 99991111 No Yes complications of bilateral lower extremity S81.812D Laceration without foreign body, left lower leg, 08/28/2019 No Yes subsequent encounter L97.222 Non-pressure chronic ulcer of left calf with fat layer 08/28/2019 No Yes exposed Inactive Problems Resolved Problems Electronic Signature(s) Signed: 09/07/2019 1:03:24 PM By: Linton Ham MD Previous Signature: 09/04/2019 5:53:37 PM Version By: Linton Ham MD Previous Signature: 09/04/2019 2:52:36 PM Version By: Carlene Coria RN Entered By: Linton Ham on 09/07/2019 13:02:54 -------------------------------------------------------------------------------- Progress Note Details Patient Name: Date of Service: Kathryn [name] 09/04/2019 1:00 PM Medical Record [id number] Patient Account Number: [id number] Date of Birth/Sex: Treating RN: 1931/05/18 (85 y.o. Orvan Falconer Primary Care Provider: Eliezer Lofts Other Clinician: Referring Provider: Treating Provider/Extender:Rowena Moilanen, [name], [name] in Treatment: 1 Subjective History  of Present Illness (HPI) ADMISSION 08/28/19 This is an 85 year old woman who is still very active still working full-time at [employer] Sometime in late September her left calf was hit by the harness of her dog who was chasing another person. She was left with a fairly clear skin tear she tried to pull the skin back over the wound but it it did not maintain. She was seen in her primary doctor's office on 07/20/2019 she was given a topical antibiotic and cephalexin. She was reviewed in primary care on 11/3 and it was felt the wound might need debridement and she was sent here. The patient has chronic venous insufficiency however she does not have a history of recurrent chronic wounds. Past medical history includes cellulitis of the left foot, hyperlipidemia, irregular heart rate, colonic polyps., Low back pain, B12 deficiency, Dupuytren's contractures, recurrent UTIs she has a history of bilateral hip surgery for osteoarthritis ABI in our clinic was 0.94 on the left 12/8; deep  laceration wound on the left medial mid tibia. We are using Iodoflex under compression. She is not eligible for home health Objective Constitutional Patient is hypertensive.. Pulse regular and within target range for patient.Marland Kitchen Respirations regular, non-labored and within target range.. Temperature is normal and within the target range for the patient.Marland Kitchen Appears in no distress. Vitals Time Taken: 2:22 PM, Height: 67 in, Weight: 125 lbs, BMI: 19.6, Temperature: 98.5 F, Pulse: 65 bpm, Respiratory Rate: 20 breaths/min, Blood Pressure: 158/81 mmHg. General Notes: Wound exam; this is on the left medial calf. Fairly large wound. Necrotic surface but somewhat better than last week. Still requiring a reasonably aggressive debridement removing adherent necrotic tissue from the wound bed. This cleans up marginally. There is no evidence of surrounding infection Integumentary (Hair, Skin) Wound #1 status is Open. Original cause of  wound was Trauma. The wound is located on the Left,Medial Lower Leg. The wound measures 5.4cm length x 2.6cm width x 0.3cm depth; 11.027cm^2 area and 3.308cm^3 volume. There is Fat Layer (Subcutaneous Tissue) Exposed exposed. There is no tunneling or undermining noted. There is a medium amount of serosanguineous drainage noted. The wound margin is thickened. There is medium (34-66%) red granulation within the wound bed. There is a medium (34-66%) amount of necrotic tissue within the wound bed including Adherent Slough. Assessment Active Problems ICD-10 Laceration without foreign body, left lower leg, subsequent encounter Non-pressure chronic ulcer of left calf with fat layer exposed Chronic venous hypertension (idiopathic) without complications of bilateral lower extremity Procedures Wound #1 Pre-procedure diagnosis of Wound #1 is a Venous Leg Ulcer located on the Left,Medial Lower Leg .Severity of Tissue Pre Debridement is: Fat layer exposed. There was a Excisional Skin/Subcutaneous Tissue Debridement with a total area of 14.04 sq cm performed by Ricard Dillon., MD. With the following instrument(s): Curette to remove Viable and Non-Viable tissue/material. Material removed includes Subcutaneous Tissue, Slough, Skin: Dermis, and Skin: Epidermis after achieving pain control using Other (benzocaine 20%). No specimens were taken. A time out was conducted at 15:14, prior to the start of the procedure. A Minimum amount of bleeding was controlled with Pressure. The procedure was tolerated well with a pain level of 3 throughout and a pain level of 0 following the procedure. Post Debridement Measurements: 5.4cm length x 2.6cm width x 0.3cm depth; 3.308cm^3 volume. Character of Wound/Ulcer Post Debridement is improved. Severity of Tissue Post Debridement is: Fat layer exposed. Post procedure Diagnosis Wound #1: Same as Pre-Procedure Pre-procedure diagnosis of Wound #1 is a Venous Leg Ulcer  located on the Left,Medial Lower Leg . There was a Three Layer Compression Therapy Procedure by Carlene Coria, RN. Post procedure Diagnosis Wound #1: Same as Pre-Procedure Plan Follow-up Appointments: Return Appointment in 1 week. Dressing Change Frequency: Do not change entire dressing for one week. Skin Barriers/Peri-Wound Care: Wound #1 Left,Medial Lower Leg: Barrier cream Wound Cleansing: Wound #1 Left,Medial Lower Leg: May shower with protection. Primary Wound Dressing: Wound #1 Left,Medial Lower Leg: Iodoflex Secondary Dressing: Wound #1 Left,Medial Lower Leg: Dry Gauze ABD pad Edema Control: 3 Layer Compression System - Left Lower Extremity 1. Continue with Iodoflex under 3 layer compression 2. The surface is better post debridement today than last week but this is going to be a prolonged period. I suspect she will need mechanical debridement next week as well. 3. Perhaps a bit optimistically I have tried to put Apligraf through her Musician) Signed: 09/04/2019 5:53:37 PM By: Linton Ham MD Entered By: Linton Ham on 09/04/2019 15:34:17 --------------------------------------------------------------------------------  SuperBill Details Patient Name: Date of Service: Kathryn [name], Kathryn [name] 09/04/2019 Medical Record [id number] Patient Account Number: [id number] Date of Birth/Sex: Treating RN: 30-May-1931 (85 y.o. Orvan Falconer Primary Care Provider: Eliezer Lofts Other Clinician: Referring Provider: Treating Provider/Extender:Izabela Ow, [name], [name] in Treatment: 1 Diagnosis Coding ICD-10 Codes Code Description S81.812D Laceration without foreign body, left lower leg, subsequent encounter L97.222 Non-pressure chronic ulcer of left calf with fat layer exposed I87.303 Chronic venous hypertension (idiopathic) without complications of bilateral lower extremity Facility Procedures CPT4 Code: [id number] Description: [id number] - DEB SUBQ  TISSUE 20 SQ CM/< ICD-10 Diagnosis Description L97.222 Non-pressure chronic ulcer of left calf with fat layer e Modifier: xposed Quantity: 1 Physician Procedures Electronic Signature(s) Signed: 09/04/2019 5:53:37 PM By: Linton Ham MD Entered By: Linton Ham on 09/04/2019 15:34:57

## 2019-09-05 NOTE — Progress Notes (Signed)
TOMEKO, CONCEPCION ([id number]) Visit Report for 09/04/2019 Arrival Information Details Patient Name: Date of Service: SAIRY, LEWI 09/04/2019 1:00 PM Medical Record [id number] Patient Account Number: [id number] Date of Birth/Sex: Treating RN: [birth year] (85 y.o. [name], Tammi Klippel Primary Care Tajah Schreiner: Eliezer Lofts Other Clinician: Referring Merrill Villarruel: Treating Burdette Gergely/Extender:Robson, [name], [name] in Treatment: 1 Visit Information History Since Last Visit Added or deleted any medications: No Patient Arrived: Ambulatory Any new allergies or adverse reactions: No Arrival Time: 14:10 Had a fall or experienced change in No Accompanied By: self activities of daily living that may affect Transfer Assistance: None risk of falls: Patient Identification Verified: Yes Signs or symptoms of abuse/neglect since last No Secondary Verification Process Completed: Yes visito Patient Requires Transmission-Based No Hospitalized since last visit: No Precautions: Implantable device outside of the clinic excluding No Patient Has Alerts: No cellular tissue based products placed in the center since last visit: Has Dressing in Place as Prescribed: Yes Has Compression in Place as Prescribed: Yes Pain Present Now: No Electronic Signature(s) Signed: 09/04/2019 5:32:49 PM By: Deon Pilling Entered By: Deon Pilling on 09/04/2019 14:22:34 -------------------------------------------------------------------------------- Compression Therapy Details Patient Name: Date of Service: [name] 09/04/2019 1:00 PM Medical Record [id number] Patient Account Number: [id number] Date of Birth/Sex: Treating RN: 02/12/1931 (85 y.o. Orvan Falconer Primary Care Gerri Acre: Eliezer Lofts Other Clinician: Referring Ekaterina Denise: Treating Aireona Torelli/Extender:Robson, [name], [name] in Treatment: 1 Compression Therapy Performed for Wound Wound #1 Left,Medial Lower  Leg Assessment: Performed By: Clinician Carlene Coria, RN Compression Type: Three Layer Post Procedure Diagnosis Same as Pre-procedure Electronic Signature(s) Signed: 09/05/2019 12:07:41 PM By: Carlene Coria RN Entered By: Carlene Coria on 09/04/2019 15:20:17 -------------------------------------------------------------------------------- Encounter Discharge Information Details Patient Name: Date of Service: [name] 09/04/2019 1:00 PM Medical Record [id number] Patient Account Number: [id number] Date of Birth/Sex: Treating RN: [birth year] (85 y.o. [name] Primary Care Liam Cammarata: Eliezer Lofts Other Clinician: Referring Marasia Newhall: Treating Lennan Malone/Extender:Robson, [name], [name] in Treatment: 1 Encounter Discharge Information Items Post Procedure Vitals Discharge Condition: Stable Temperature (F): 98.5 Ambulatory Status: Ambulatory Pulse (bpm): 65 Discharge Destination: Home Respiratory Rate (breaths/min): 20 Transportation: Private Auto Blood Pressure (mmHg): 158/81 Accompanied By: self Schedule Follow-up Appointment: Yes Clinical Summary of Care: Patient Declined Electronic Signature(s) Signed: 09/05/2019 12:04:56 PM By: Kela Millin Entered By: Kela Millin on 09/04/2019 15:38:14 -------------------------------------------------------------------------------- Lower Extremity Assessment Details Patient Name: Date of Service: BROGAN, THORBURN 09/04/2019 1:00 PM Medical Record [id number] Patient Account Number: [id number] Date of Birth/Sex: Treating RN: 1931-04-14 (85 y.o. [name] Primary Care Linna Thebeau: Eliezer Lofts Other Clinician: Referring Shirlena Brinegar: Treating Izumi Mixon/Extender:Robson, [name], [name] in Treatment: 1 Edema Assessment Assessed: [Left: Yes] [Right: No] Edema: [Left: N] [Right: o] Calf Left: Right: Point of Measurement: cm From Medial Instep 31.5 cm cm Ankle Left: Right: Point of  Measurement: cm From Medial Instep 21 cm cm Vascular Assessment Pulses: Dorsalis Pedis Palpable: [Left:Yes] Electronic Signature(s) Signed: 09/04/2019 5:32:49 PM By: Deon Pilling Entered By: Deon Pilling on 09/04/2019 14:23:27 -------------------------------------------------------------------------------- Multi Wound Chart Details Patient Name: Date of Service: [name] 09/04/2019 1:00 PM Medical Record [id number] Patient Account Number: [id number] Date of Birth/Sex: Treating RN: [birth year] (85 y.o. Orvan Falconer Primary Care Else Habermann: Eliezer Lofts Other Clinician: Referring Jacquette Canales: Treating Harue Pribble/Extender:Robson, [name], [name] in Treatment: 1 Vital Signs Height(in): 67 Pulse(bpm): 65 Weight(lbs): 125 Blood Pressure(mmHg): 158/81 Body Mass Index(BMI): 20 Temperature(F): 98.5 Respiratory 20 Rate(breaths/min): Photos: [1:No Photos] [N/A:N/A] Wound Location: [1:Left Lower Leg - Medial] [N/A:N/A]  Wounding Event: [1:Trauma] [N/A:N/A] Primary Etiology: [1:Venous Leg Ulcer] [N/A:N/A] Comorbid History: [1:Cataracts, Osteoarthritis, Confinement Anxiety] [N/A:N/A] Date Acquired: [1:05/29/2019] [N/A:N/A] Weeks of Treatment: [1:1] [N/A:N/A] Wound Status: [1:Open] [N/A:N/A] Measurements L x W x D 5.4x2.6x0.3 [N/A:N/A] (cm) Area (cm) : [1:11.027] [N/A:N/A] Volume (cm) : [1:3.308] [N/A:N/A] % Reduction in Area: [1:-10.40%] [N/A:N/A] % Reduction in Volume: -10.40% [N/A:N/A] Classification: [1:Full Thickness Without Exposed Support Structures] [N/A:N/A] Exudate Amount: [1:Medium] [N/A:N/A] Exudate Type: [1:Serosanguineous] [N/A:N/A] Exudate Color: [1:red, brown] [N/A:N/A] Wound Margin: [1:Thickened] [N/A:N/A] Granulation Amount: [1:Medium (34-66%)] [N/A:N/A] Granulation Quality: [1:Red] [N/A:N/A] Necrotic Amount: [1:Medium (34-66%)] [N/A:N/A] Exposed Structures: [1:Fat Layer (Subcutaneous N/A Tissue) Exposed: Yes Fascia: No Tendon: No Muscle: No  Joint: No Bone: No] Epithelialization: [1:Small (1-33%)] [N/A:N/A] Debridement: [1:Debridement - Excisional N/A] Pre-procedure [1:15:14] [N/A:N/A] Verification/Time Out Taken: Pain Control: [1:Other] [N/A:N/A] Tissue Debrided: [1:Subcutaneous, Slough] [N/A:N/A] Level: [1:Skin/Subcutaneous Tissue] [N/A:N/A] Debridement Area (sq cm):14.04 [N/A:N/A] Instrument: [1:Curette] [N/A:N/A] Bleeding: [1:Minimum] [N/A:N/A] Hemostasis Achieved: [1:Pressure] [N/A:N/A] Procedural Pain: [1:3] [N/A:N/A] Post Procedural Pain: [1:0] [N/A:N/A] Debridement Treatment Procedure was tolerated [N/A:N/A] Response: [1:well] Post Debridement [1:5.4x2.6x0.3] [N/A:N/A] Measurements L x W x D (cm) Post Debridement [1:3.308] [N/A:N/A] Volume: (cm) Procedures Performed: Compression Therapy [1:Debridement] [N/A:N/A] Treatment Notes Electronic Signature(s) Signed: 09/04/2019 5:53:37 PM By: Linton Ham MD Signed: 09/05/2019 12:07:41 PM By: Carlene Coria RN Entered By: Linton Ham on 09/04/2019 15:30:40 -------------------------------------------------------------------------------- Multi-Disciplinary Care Plan Details Patient Name: Date of Service: [name] 09/04/2019 1:00 PM Medical Record [id number] Patient Account Number: [id number] Date of Birth/Sex: Treating RN: [birth year] (85 y.o. Orvan Falconer Primary Care Jerrianne Hartin: Eliezer Lofts Other Clinician: Referring Azhane Eckart: Treating Rondell Frick/Extender:Robson, [name], [name] in Treatment: 1 Active Inactive Wound/Skin Impairment Nursing Diagnoses: Knowledge deficit related to ulceration/compromised skin integrity Goals: Patient/caregiver will verbalize understanding of skin care regimen Date Initiated: 08/28/2019 Target Resolution Date: 09/14/2019 Goal Status: Active Ulcer/skin breakdown will have a volume reduction of 30% by week 4 Date Initiated: 08/28/2019 Target Resolution Date: 09/14/2019 Goal Status:  Active Interventions: Assess patient/caregiver ability to obtain necessary supplies Assess patient/caregiver ability to perform ulcer/skin care regimen upon admission and as needed Assess ulceration(s) every visit Notes: Electronic Signature(s) Signed: 09/04/2019 2:52:36 PM By: Carlene Coria RN Entered By: Carlene Coria on 09/04/2019 14:10:33 -------------------------------------------------------------------------------- Pain Assessment Details Patient Name: Date of Service: KARESA, SITZER 09/04/2019 1:00 PM Medical Record [id number] Patient Account Number: [id number] Date of Birth/Sex: Treating RN: 1931/07/13 (85 y.o. [name] Primary Care Sian Rockers: Eliezer Lofts Other Clinician: Referring Shena Vinluan: Treating Koren Sermersheim/Extender:Robson, [name], [name] in Treatment: 1 Active Problems Location of Pain Severity and Description of Pain Patient Has Paino No Site Locations Rate the pain. Current Pain Level: 0 Pain Management and Medication Current Pain Management: Medication: No Cold Application: No Rest: No Massage: No Activity: No T.E.N.S.: No Heat Application: No Leg drop or elevation: No Is the Current Pain Management Adequate: Adequate How does your wound impact your activities of daily livingo Sleep: No Bathing: No Appetite: No Relationship With Others: No Bladder Continence: No Emotions: No Bowel Continence: No Work: No Toileting: No Drive: No Dressing: No Hobbies: No Electronic Signature(s) Signed: 09/04/2019 5:32:49 PM By: Deon Pilling Entered By: Deon Pilling on 09/04/2019 14:22:59 -------------------------------------------------------------------------------- Patient/Caregiver Education Details Patient Name: Date of Service: Sillas, Babetta B. 12/8/2020andnbsp1:00 PM Medical Record [id number] Patient Account Number: [id number] Date of Birth/Gender: 1931-02-22 (85 y.o. F) Treating RN: Carlene Coria Primary Care Physician:  Eliezer Lofts Other Clinician: Referring Physician: Treating Physician/Extender:Robson, [name], [name] in Treatment: 1 Education Assessment Education Provided To: Patient  Education Topics Provided Wound/Skin Impairment: Methods: Explain/Verbal Responses: State content correctly Electronic Signature(s) Signed: 09/04/2019 2:52:36 PM By: Carlene Coria RN Entered By: Carlene Coria on 09/04/2019 14:10:48 -------------------------------------------------------------------------------- Wound Assessment Details Patient Name: Date of Service: ENAS, SUERO 09/04/2019 1:00 PM Medical Record [id number] Patient Account Number: [id number] Date of Birth/Sex: Treating RN: 29-Oct-1930 (85 y.o. [name] Primary Care Darek Eifler: Eliezer Lofts Other Clinician: Referring Sharren Schnurr: Treating Shaylyn Bawa/Extender:Robson, [name], [name] in Treatment: 1 Wound Status Wound Number: 1 Primary Venous Leg Ulcer Etiology: Wound Location: Left Lower Leg - Medial Wound Status: Open Wounding Event: Trauma Comorbid Cataracts, Osteoarthritis, Confinement Date Acquired: 05/29/2019 History: Anxiety Weeks Of Treatment: 1 Clustered Wound: No Photos Wound Measurements Length: (cm) 5.4 % Reduct Width: (cm) 2.6 % Reduct Depth: (cm) 0.3 Epitheli Area: (cm) 11.027 Tunneli Volume: (cm) 3.308 Undermi Wound Description Full Thickness Without Exposed Support Foul Od Classification: Structures Slough/ Wound Thickened Margin: Exudate Medium Amount: Exudate Serosanguineous Type: Exudate red, brown Color: Wound Bed Granulation Amount: Medium (34-66%) Granulation Quality: Red Fascia Ex Necrotic Amount: Medium (34-66%) Fat Layer Necrotic Quality: Adherent Slough Tendon Ex Muscle Ex Joint Exp Bone Expo or After Cleansing: No Fibrino Yes Exposed Structure posed: No (Subcutaneous Tissue) Exposed: Yes posed: No posed: No osed: No sed: No ion in Area: -10.4% ion in  Volume: -10.4% alization: Small (1-33%) ng: No ning: No Treatment Notes Wound #1 (Left, Medial Lower Leg) 1. Cleanse With Wound Cleanser Soap and water 2. Periwound Care Barrier cream Moisturizing lotion 3. Primary Dressing Applied Iodoflex 4. Secondary Dressing ABD Pad Dry Gauze 6. Support Layer Applied 3 layer compression wrap Electronic Signature(s) Signed: 09/05/2019 11:25:39 AM By: Mikeal Hawthorne EMT/HBOT Signed: 09/05/2019 12:05:21 PM By: Deon Pilling Previous Signature: 09/04/2019 5:32:49 PM Version By: Deon Pilling Entered By: Mikeal Hawthorne on 09/05/2019 11:25:15 -------------------------------------------------------------------------------- Vitals Details Patient Name: Date of Service: [name]. 09/04/2019 1:00 PM Medical Record [id number] Patient Account Number: [id number] Date of Birth/Sex: Treating RN: September 18, 1931 (85 y.o. [name] Primary Care Emali Heyward: Eliezer Lofts Other Clinician: Referring Jood Retana: Treating Lempi Edwin/Extender:Robson, [name], [name] in Treatment: 1 Vital Signs Time Taken: 14:22 Temperature (F): 98.5 Height (in): 67 Pulse (bpm): 65 Weight (lbs): 125 Respiratory Rate (breaths/min): 20 Body Mass Index (BMI): 19.6 Blood Pressure (mmHg): 158/81 Reference Range: 80 - 120 mg / dl Electronic Signature(s) Signed: 09/04/2019 5:32:49 PM By: Deon Pilling Entered By: Deon Pilling on 09/04/2019 14:22:51

## 2019-09-11 ENCOUNTER — Other Ambulatory Visit: Payer: Self-pay

## 2019-09-11 ENCOUNTER — Encounter (HOSPITAL_BASED_OUTPATIENT_CLINIC_OR_DEPARTMENT_OTHER): Payer: Medicare Other | Admitting: Internal Medicine

## 2019-09-11 DIAGNOSIS — I87312 Chronic venous hypertension (idiopathic) with ulcer of left lower extremity: Secondary | ICD-10-CM | POA: Diagnosis not present

## 2019-09-11 DIAGNOSIS — L97222 Non-pressure chronic ulcer of left calf with fat layer exposed: Secondary | ICD-10-CM | POA: Diagnosis not present

## 2019-09-11 DIAGNOSIS — I87303 Chronic venous hypertension (idiopathic) without complications of bilateral lower extremity: Secondary | ICD-10-CM | POA: Diagnosis not present

## 2019-09-12 NOTE — Progress Notes (Signed)
Kathryn [name] ([id number]) Visit Report for 09/11/2019 Debridement Details Patient Name: Date of Service: Kathryn [name] 09/11/2019 8:45 AM Medical Record [id number] Patient Account Number: [id number] Date of Birth/Sex: Treating RN: 09-04-1931 (85 y.o. F) Primary Care Provider: Eliezer Lofts Other Clinician: Referring Provider: Treating Provider/Extender:Jameila Keeny, [name], [name] in Treatment: 2 Debridement Performed for Wound #1 Left,Medial Lower Leg Assessment: Performed By: Physician Ricard Dillon., MD Debridement Type: Debridement Severity of Tissue Pre Fat layer exposed Debridement: Level of Consciousness (Pre- Awake and Alert procedure): Pre-procedure Verification/Time Out Taken: Yes - 09:09 Start Time: 09:09 Pain Control: Other : benzocaine, 20% Total Area Debrided (L x W): 5.7 (cm) x 2.8 (cm) = 15.96 (cm) Tissue and other material Viable, Slough, Subcutaneous, Slough debrided: Level: Skin/Subcutaneous Tissue Debridement Description: Excisional Instrument: Curette Bleeding: Moderate Hemostasis Achieved: Pressure End Time: 09:11 Procedural Pain: 4 Post Procedural Pain: 2 Response to Treatment: Procedure was tolerated well Level of Consciousness Awake and Alert (Post-procedure): Post Debridement Measurements of Total Wound Length: (cm) 5.7 Width: (cm) 2.8 Depth: (cm) 0.3 Volume: (cm) 3.76 Character of Wound/Ulcer Post Improved Debridement: Severity of Tissue Post Debridement: Fat layer exposed Post Procedure Diagnosis Same as Pre-procedure Electronic Signature(s) Signed: 09/11/2019 5:53:46 PM By: Linton Ham MD Entered By: Linton Ham on 09/11/2019 09:32:59 -------------------------------------------------------------------------------- HPI Details Patient Name: Date of Service: Kathryn [name] 09/11/2019 8:45 AM Medical Record [id number] Patient Account Number: [id number] Date of Birth/Sex: Treating RN: [birth year]  (85 y.o. F) Primary Care Provider: Eliezer Lofts Other Clinician: Referring Provider: Treating Provider/Extender:Fumio Vandam, [name], [name] in Treatment: 2 History of Present Illness HPI Description: ADMISSION 08/28/19 This is an 85 year old woman who is still very active still working full-time at [employer] Sometime in late September her left calf was hit by the harness of her dog who was chasing another person. She was left with a fairly clear skin tear she tried to pull the skin back over the wound but it it did not maintain. She was seen in her primary doctor's office on 07/20/2019 she was given a topical antibiotic and cephalexin. She was reviewed in primary care on 11/3 and it was felt the wound might need debridement and she was sent here. The patient has chronic venous insufficiency however she does not have a history of recurrent chronic wounds. Past medical history includes cellulitis of the left foot, hyperlipidemia, irregular heart rate, colonic polyps., Low back pain, B12 deficiency, Dupuytren's contractures, recurrent UTIs she has a history of bilateral hip surgery for osteoarthritis ABI in our clinic was 0.94 on the left 12/8; deep laceration wound on the left medial mid tibia. We are using Iodoflex under compression. She is not eligible for home health 12/15; deep laceration wound on the left mid tibia. We are using Iodoflex under compression. Perhaps some minimal improvement in wound surface certainly no improvement in surface area Electronic Signature(s) Signed: 09/11/2019 5:53:46 PM By: Linton Ham MD Entered By: Linton Ham on 09/11/2019 09:33:34 -------------------------------------------------------------------------------- Physical Exam Details Patient Name: Date of Service: Kathryn [name] 09/11/2019 8:45 AM Medical Record [id number] Patient Account Number: [id number] Date of Birth/Sex: Treating RN: 1931/01/12 (85 y.o. F) Primary Care  Provider: Eliezer Lofts Other Clinician: Referring Provider: Treating Provider/Extender:Taggart Prasad, [name], [name] in Treatment: 2 Notes Wound exam; this is on the left medial calf fairly large wound still necrotic surface with rolled edges. Still requiring as much of an aggressive debridement as this woman can tolerate. Removing adherent necrotic debris. There is no evidence of surrounding infection Electronic  Signature(s) Signed: 09/11/2019 5:53:46 PM By: Linton Ham MD Entered By: Linton Ham on 09/11/2019 09:34:30 -------------------------------------------------------------------------------- Physician Orders Details Patient Name: Date of Service: Kathryn [name] 09/11/2019 8:45 AM Medical Record [id number] Patient Account Number: [id number] Date of Birth/Sex: Treating RN: June 18, 1931 (85 y.o. Orvan Falconer Primary Care Provider: Eliezer Lofts Other Clinician: Referring Provider: Treating Provider/Extender:Tonette Koehne, [name], [name] in Treatment: 2 Verbal / Phone Orders: No Diagnosis Coding ICD-10 Coding Code Description I87.303 Chronic venous hypertension (idiopathic) without complications of bilateral lower extremity S81.812D Laceration without foreign body, left lower leg, subsequent encounter L97.222 Non-pressure chronic ulcer of left calf with fat layer exposed Follow-up Appointments Return Appointment in 1 week. Dressing Change Frequency Do not change entire dressing for one week. Skin Barriers/Peri-Wound Care Wound #1 Left,Medial Lower Leg Barrier cream Wound Cleansing Wound #1 Left,Medial Lower Leg May shower with protection. Primary Wound Dressing Wound #1 Left,Medial Lower Leg Iodoflex Secondary Dressing Wound #1 Left,Medial Lower Leg Dry Gauze ABD pad Carboflex Edema Control 3 Layer Compression System - Left Lower Extremity Electronic Signature(s) Signed: 09/11/2019 5:53:46 PM By: Linton Ham MD Signed: 09/12/2019  9:49:12 AM By: Carlene Coria RN Entered By: Carlene Coria on 09/11/2019 09:15:30 -------------------------------------------------------------------------------- Problem List Details Patient Name: Date of Service: Kathryn [name] 09/11/2019 8:45 AM Medical Record [id number] Patient Account Number: [id number] Date of Birth/Sex: Treating RN: 1931-07-04 (85 y.o. Orvan Falconer Primary Care Provider: Eliezer Lofts Other Clinician: Referring Provider: Treating Provider/Extender:Normand Damron, [name], [name] in Treatment: 2 Active Problems ICD-10 Evaluated Encounter Code Description Active Date Today Diagnosis I87.303 Chronic venous hypertension (idiopathic) without 99991111 No Yes complications of bilateral lower extremity S81.812D Laceration without foreign body, left lower leg, 08/28/2019 No Yes subsequent encounter L97.222 Non-pressure chronic ulcer of left calf with fat layer 08/28/2019 No Yes exposed Inactive Problems Resolved Problems Electronic Signature(s) Signed: 09/11/2019 5:53:46 PM By: Linton Ham MD Entered By: Linton Ham on 09/11/2019 09:32:40 -------------------------------------------------------------------------------- Progress Note Details Patient Name: Date of Service: Kathryn [name] 09/11/2019 8:45 AM Medical Record [id number] Patient Account Number: [id number] Date of Birth/Sex: Treating RN: 11/16/1930 (85 y.o. F) Primary Care Provider: Eliezer Lofts Other Clinician: Referring Provider: Treating Provider/Extender:Ricca Melgarejo, [name], [name] in Treatment: 2 Subjective History of Present Illness (HPI) ADMISSION 08/28/19 This is an 85 year old woman who is still very active still working full-time at [employer] Sometime in late September her left calf was hit by the harness of her dog who was chasing another person. She was left with a fairly clear skin tear she tried to pull the skin back over the wound but it it did not  maintain. She was seen in her primary doctor's office on 07/20/2019 she was given a topical antibiotic and cephalexin. She was reviewed in primary care on 11/3 and it was felt the wound might need debridement and she was sent here. The patient has chronic venous insufficiency however she does not have a history of recurrent chronic wounds. Past medical history includes cellulitis of the left foot, hyperlipidemia, irregular heart rate, colonic polyps., Low back pain, B12 deficiency, Dupuytren's contractures, recurrent UTIs she has a history of bilateral hip surgery for osteoarthritis ABI in our clinic was 0.94 on the left 12/8; deep laceration wound on the left medial mid tibia. We are using Iodoflex under compression. She is not eligible for home health 12/15; deep laceration wound on the left mid tibia. We are using Iodoflex under compression. Perhaps some minimal improvement in wound surface certainly no improvement in surface area Objective  Constitutional Vitals Time Taken: 8:52 AM, Height: 67 in, Weight: 125 lbs, BMI: 19.6, Temperature: 97.9 F, Pulse: 68 bpm, Respiratory Rate: 16 breaths/min, Blood Pressure: 156/86 mmHg. Integumentary (Hair, Skin) Wound #1 status is Open. Original cause of wound was Trauma. The wound is located on the Left,Medial Lower Leg. The wound measures 5.7cm length x 2.8cm width x 0.3cm depth; 12.535cm^2 area and 3.76cm^3 volume. There is Fat Layer (Subcutaneous Tissue) Exposed exposed. There is no tunneling or undermining noted. There is a medium amount of serosanguineous drainage noted. The wound margin is thickened. There is small (1-33%) red granulation within the wound bed. There is a large (67-100%) amount of necrotic tissue within the wound bed including Adherent Slough. Assessment Active Problems ICD-10 Chronic venous hypertension (idiopathic) without complications of bilateral lower extremity Laceration without foreign body, left lower leg, subsequent  encounter Non-pressure chronic ulcer of left calf with fat layer exposed Procedures Wound #1 Pre-procedure diagnosis of Wound #1 is a Venous Leg Ulcer located on the Left,Medial Lower Leg .Severity of Tissue Pre Debridement is: Fat layer exposed. There was a Excisional Skin/Subcutaneous Tissue Debridement with a total area of 15.96 sq cm performed by Ricard Dillon., MD. With the following instrument(s): Curette to remove Viable tissue/material. Material removed includes Subcutaneous Tissue and Slough and after achieving pain control using Other (benzocaine, 20%). No specimens were taken. A time out was conducted at 09:09, prior to the start of the procedure. A Moderate amount of bleeding was controlled with Pressure. The procedure was tolerated well with a pain level of 4 throughout and a pain level of 2 following the procedure. Post Debridement Measurements: 5.7cm length x 2.8cm width x 0.3cm depth; 3.76cm^3 volume. Character of Wound/Ulcer Post Debridement is improved. Severity of Tissue Post Debridement is: Fat layer exposed. Post procedure Diagnosis Wound #1: Same as Pre-Procedure Pre-procedure diagnosis of Wound #1 is a Venous Leg Ulcer located on the Left,Medial Lower Leg . There was a Three Layer Compression Therapy Procedure by Carlene Coria, RN. Post procedure Diagnosis Wound #1: Same as Pre-Procedure Plan Follow-up Appointments: Return Appointment in 1 week. Dressing Change Frequency: Do not change entire dressing for one week. Skin Barriers/Peri-Wound Care: Wound #1 Left,Medial Lower Leg: Barrier cream Wound Cleansing: Wound #1 Left,Medial Lower Leg: May shower with protection. Primary Wound Dressing: Wound #1 Left,Medial Lower Leg: Iodoflex Secondary Dressing: Wound #1 Left,Medial Lower Leg: Dry Gauze ABD pad Carboflex Edema Control: 3 Layer Compression System - Left Lower Extremity 1. I am continuing with the Iodoflex under 3 layer compression 2. Still  requiring debridement. 3. Minimal improvement in wound surface today consider Sorbact next week. 4. I did try and run A[employer] through her insurance. I we are not exactly sure whether her secondary will pick up the co-pay otherwise it is [id number] per application out-of-pocket after Medicare pays 80% Electronic Signature(s) Signed: 09/11/2019 5:53:46 PM By: Linton Ham MD Entered By: Linton Ham on 09/11/2019 09:35:27 -------------------------------------------------------------------------------- SuperBill Details Patient Name: Date of Service: Kathryn [name] 09/11/2019 Medical Record [id number] Patient Account Number: [id number] Date of Birth/Sex: Treating RN: 1930/12/07 (85 y.o. F) Primary Care Provider: Eliezer Lofts Other Clinician: Referring Provider: Treating Provider/Extender:Blayze Haen, [name], [name] in Treatment: 2 Diagnosis Coding ICD-10 Codes Code Description I87.303 Chronic venous hypertension (idiopathic) without complications of bilateral lower extremity S81.812D Laceration without foreign body, left lower leg, subsequent encounter L97.222 Non-pressure chronic ulcer of left calf with fat layer exposed Facility Procedures CPT4 Code Description: [id number] - DEB SUBQ TISSUE 20 SQ CM/<  ICD-10 Diagnosis Description S81.812D Laceration without foreign body, left lower leg, subsequen L97.222 Non-pressure chronic ulcer of left calf with fat layer exp Modifier: t encounter osed Quantity: 1 Physician Procedures CPT4 Code Description: [id number] - WC PHYS SUBQ TISS 20 SQ CM ICD-10 Diagnosis Description S81.812D Laceration without foreign body, left lower leg, subsequen L97.222 Non-pressure chronic ulcer of left calf with fat layer exp Modifier: t encounter osed Quantity: 1 Electronic Signature(s) Signed: 09/11/2019 5:53:46 PM By: Linton Ham MD Entered By: Linton Ham on 09/11/2019 09:35:40

## 2019-09-13 NOTE — Progress Notes (Signed)
NYJA, RINDT ([id number]) Visit Report for 09/11/2019 Arrival Information Details Patient Name: Date of Service: Kathryn [name], Kathryn [name] 09/11/2019 8:45 AM Medical Record [id number] Patient Account Number: [id number] Date of Birth/Sex: Treating RN: 02-01-1931 (85 y.o. [name] Primary Care Elouise Divelbiss: Eliezer Lofts Other Clinician: Referring Redell Nazir: Treating Lileigh Fahringer/Extender:Robson, [name], [name] in Treatment: 2 Visit Information History Since Last Visit Added or deleted any medications: No Patient Arrived: Ambulatory Any new allergies or adverse reactions: No Arrival Time: 08:51 Had a fall or experienced change in No Accompanied By: aone activities of daily living that may affect Transfer Assistance: None risk of falls: Patient Identification Verified: Yes Signs or symptoms of abuse/neglect since last No Secondary Verification Process Completed: Yes visito Patient Requires Transmission-Based No Hospitalized since last visit: No Precautions: Implantable device outside of the clinic excluding No Patient Has Alerts: No cellular tissue based products placed in the center since last visit: Has Dressing in Place as Prescribed: Yes Has Compression in Place as Prescribed: Yes Pain Present Now: No Electronic Signature(s) Signed: 09/13/2019 5:32:06 PM By: Levan Hurst RN, BSN Entered By: Levan Hurst on 09/11/2019 08:52:12 -------------------------------------------------------------------------------- Compression Therapy Details Patient Name: Date of Service: Kathryn [name] 09/11/2019 8:45 AM Medical Record [id number] Patient Account Number: [id number] Date of Birth/Sex: Treating RN: 1931/08/20 (85 y.o. Orvan Falconer Primary Care Previn Jian: Eliezer Lofts Other Clinician: Referring Samah Lapiana: Treating Billi Bright/Extender:Robson, [name], [name] in Treatment: 2 Compression Therapy Performed for Wound Wound #1 Left,Medial Lower  Leg Assessment: Performed By: Clinician Carlene Coria, RN Compression Type: Three Layer Post Procedure Diagnosis Same as Pre-procedure Electronic Signature(s) Signed: 09/12/2019 9:49:12 AM By: Carlene Coria RN Entered By: Carlene Coria on 09/11/2019 09:12:50 -------------------------------------------------------------------------------- Encounter Discharge Information Details Patient Name: Date of Service: Kathryn [name], Kathryn [name] 09/11/2019 8:45 AM Medical Record [id number] Patient Account Number: [id number] Date of Birth/Sex: Treating RN: 1930/10/20 (85 y.o. [name] Primary Care Jhace Fennell: Eliezer Lofts Other Clinician: Referring Albirda Shiel: Treating Tarek Cravens/Extender:Robson, [name], [name] in Treatment: 2 Encounter Discharge Information Items Post Procedure Vitals Discharge Condition: Stable Temperature (F): 97.9 Ambulatory Status: Ambulatory Pulse (bpm): 68 Discharge Destination: Home Respiratory Rate (breaths/min): 16 Transportation: Private Auto Blood Pressure (mmHg): 156/86 Accompanied By: self Schedule Follow-up Appointment: Yes Clinical Summary of Care: Patient Declined Electronic Signature(s) Signed: 09/13/2019 5:33:40 PM By: Kela Millin Entered By: Kela Millin on 09/11/2019 17:42:39 -------------------------------------------------------------------------------- Lower Extremity Assessment Details Patient Name: Date of Service: Kathryn [name], Kathryn [name] 09/11/2019 8:45 AM Medical Record [id number] Patient Account Number: [id number] Date of Birth/Sex: Treating RN: [birth year] (85 y.o. [name] Primary Care Pailyn Bellevue: Eliezer Lofts Other Clinician: Referring Nihar Klus: Treating Mekala Winger/Extender:Robson, [name], [name] in Treatment: 2 Edema Assessment Assessed: [Left: No] [Right: No] Edema: [Left: N] [Right: o] Calf Left: Right: Point of Measurement: cm From Medial Instep 29.8 cm cm Ankle Left: Right: Point of  Measurement: cm From Medial Instep 20.2 cm cm Vascular Assessment Pulses: Dorsalis Pedis Palpable: [Left:Yes] Electronic Signature(s) Signed: 09/13/2019 5:32:06 PM By: Levan Hurst RN, BSN Entered By: Levan Hurst on 09/11/2019 08:57:51 -------------------------------------------------------------------------------- Multi Wound Chart Details Patient Name: Date of Service: Kathryn [name] 09/11/2019 8:45 AM Medical Record [id number] Patient Account Number: [id number] Date of Birth/Sex: Treating RN: Dec 26, 1930 (85 y.o. F) Primary Care Neeko Pharo: Eliezer Lofts Other Clinician: Referring Jasmeet Gehl: Treating Dameer Speiser/Extender:Robson, [name], [name] in Treatment: 2 Vital Signs Height(in): 67 Pulse(bpm): 68 Weight(lbs): 125 Blood Pressure(mmHg): 156/86 Body Mass Index(BMI): 20 Temperature(F): 97.9 Respiratory 16 Rate(breaths/min): Photos: [1:No Photos] [N/A:N/A] Wound Location: [1:Left Lower Leg -  Medial] [N/A:N/A] Wounding Event: [1:Trauma] [N/A:N/A] Primary Etiology: [1:Venous Leg Ulcer] [N/A:N/A] Comorbid History: [1:Cataracts, Osteoarthritis, Confinement Anxiety] [N/A:N/A] Date Acquired: [1:05/29/2019] [N/A:N/A] Weeks of Treatment: [1:2] [N/A:N/A] Wound Status: [1:Open] [N/A:N/A] Measurements L x W x D 5.7x2.8x0.3 [N/A:N/A] (cm) Area (cm) : [1:12.535] [N/A:N/A] Volume (cm) : [1:3.76] [N/A:N/A] % Reduction in Area: [1:-25.50%] [N/A:N/A] % Reduction in Volume: -25.50% [N/A:N/A] Classification: [1:Full Thickness Without Exposed Support Structures] [N/A:N/A] Exudate Amount: [1:Medium] [N/A:N/A] Exudate Type: [1:Serosanguineous] [N/A:N/A] Exudate Color: [1:red, brown] [N/A:N/A] Wound Margin: [1:Thickened] [N/A:N/A] Granulation Amount: [1:Small (1-33%)] [N/A:N/A] Granulation Quality: [1:Red] [N/A:N/A] Necrotic Amount: [1:Large (67-100%)] [N/A:N/A] Exposed Structures: [1:Fat Layer (Subcutaneous N/A Tissue) Exposed: Yes Fascia: No Tendon: No Muscle: No  Joint: No Bone: No] Epithelialization: [1:Small (1-33%)] [N/A:N/A] Debridement: [1:Debridement - Excisional N/A] Pre-procedure [1:09:09] [N/A:N/A] Verification/Time Out Taken: Pain Control: [1:Other] [N/A:N/A] Tissue Debrided: [1:Subcutaneous, Slough] [N/A:N/A] Level: [1:Skin/Subcutaneous Tissue] [N/A:N/A] Debridement Area (sq cm):15.96 [N/A:N/A] Instrument: [1:Curette] [N/A:N/A] Bleeding: [1:Moderate] [N/A:N/A] Hemostasis Achieved: [1:Pressure] [N/A:N/A] Procedural Pain: [1:4] [N/A:N/A] Post Procedural Pain: [1:2] [N/A:N/A] Debridement Treatment Procedure was tolerated [N/A:N/A] Response: [1:well] Post Debridement [1:5.7x2.8x0.3] [N/A:N/A] Measurements L x W x D (cm) Post Debridement [1:3.76] [N/A:N/A] Volume: (cm) Procedures Performed: Compression Therapy [1:Debridement] [N/A:N/A] Treatment Notes Electronic Signature(s) Signed: 09/11/2019 5:53:46 PM By: Linton Ham MD Entered By: Linton Ham on 09/11/2019 09:32:52 -------------------------------------------------------------------------------- Multi-Disciplinary Care Plan Details Patient Name: Date of Service: Kathryn [name] 09/11/2019 8:45 AM Medical Record [id number] Patient Account Number: [id number] Date of Birth/Sex: Treating RN: 1930-10-14 (85 y.o. Orvan Falconer Primary Care Asheton Scheffler: Eliezer Lofts Other Clinician: Referring Demontay Grantham: Treating Sharice Harriss/Extender:Robson, [name], [name] in Treatment: 2 Active Inactive Wound/Skin Impairment Nursing Diagnoses: Knowledge deficit related to ulceration/compromised skin integrity Goals: Patient/caregiver will verbalize understanding of skin care regimen Date Initiated: 08/28/2019 Target Resolution Date: 09/14/2019 Goal Status: Active Ulcer/skin breakdown will have a volume reduction of 30% by week 4 Date Initiated: 08/28/2019 Target Resolution Date: 09/14/2019 Goal Status: Active Interventions: Assess patient/caregiver ability to obtain  necessary supplies Assess patient/caregiver ability to perform ulcer/skin care regimen upon admission and as needed Assess ulceration(s) every visit Notes: Electronic Signature(s) Signed: 09/12/2019 9:49:12 AM By: Carlene Coria RN Entered By: Carlene Coria on 09/11/2019 08:49:48 -------------------------------------------------------------------------------- Pain Assessment Details Patient Name: Date of Service: Kathryn [name], Kathryn [name] 09/11/2019 8:45 AM Medical Record [id number] Patient Account Number: [id number] Date of Birth/Sex: Treating RN: [birth year] (85 y.o. [name] Primary Care Ellicia Alix: Eliezer Lofts Other Clinician: Referring Jaleigha Deane: Treating Cayli Escajeda/Extender:Robson, [name], [name] in Treatment: 2 Active Problems Location of Pain Severity and Description of Pain Patient Has Paino No Site Locations Pain Management and Medication Current Pain Management: Electronic Signature(s) Signed: 09/13/2019 5:32:06 PM By: Levan Hurst RN, BSN Entered By: Levan Hurst on 09/11/2019 08:52:32 -------------------------------------------------------------------------------- Patient/Caregiver Education Details Patient Name: Date of Service: Kathryn [name] 12/15/2020andnbsp8:45 AM Medical Record [id number] Patient Account Number: [id number] Date of Birth/Gender: [birth year] (85 y.o. F) Treating RN: Carlene Coria Primary Care Physician: Eliezer Lofts Other Clinician: Referring Physician: Treating Physician/Extender:Robson, [name], [name] in Treatment: 2 Education Assessment Education Provided To: Patient Education Topics Provided Wound/Skin Impairment: Methods: Explain/Verbal Responses: State content correctly Electronic Signature(s) Signed: 09/12/2019 9:49:12 AM By: Carlene Coria RN Entered By: Carlene Coria on 09/11/2019 08:50:03 -------------------------------------------------------------------------------- Wound Assessment  Details Patient Name: Date of Service: Kathryn [name], Kathryn [name] 09/11/2019 8:45 AM Medical Record [id number] Patient Account Number: [id number] Date of Birth/Sex: Treating RN: 28-Oct-1930 (85 y.o. [name] Primary Care Eurydice Calixto: Eliezer Lofts Other Clinician: Referring Marsia Cino: Treating Littie Chiem/Extender:Robson, [name], [name] in  Treatment: 2 Wound Status Wound Number: 1 Primary Venous Leg Ulcer Etiology: Wound Location: Left Lower Leg - Medial Wound Status: Open Wounding Event: Trauma Comorbid Cataracts, Osteoarthritis, Confinement Date Acquired: 05/29/2019 History: Anxiety Weeks Of Treatment: 2 Clustered Wound: No Photos Wound Measurements Length: (cm) 5.7 % Reduct Width: (cm) 2.8 % Reduct Depth: (cm) 0.3 Epitheli Area: (cm) 12.535 Tunneli Volume: (cm) 3.76 Undermi Wound Description Full Thickness Without Exposed Support Classification: Structures Wound Thickened Margin: Exudate Medium Amount: Exudate Serosanguineous Type: Exudate red, brown Color: Wound Bed Granulation Amount: Small (1-33%) Granulation Quality: Red Necrotic Amount: Large (67-100%) Necrotic Quality: Adherent Slough Treatment Notes Wound #1 (Left, Medial Lower Leg) 1. Cleanse With Wound Cleanser Soap and water 3. Primary Dressing Applied Iodoflex 4. Secondary Dressing ABD Pad Dry Gauze 6. Support Layer Applied 3 layer compression wrap Foul Odor After Cleansing: No Slough/Fibrino Yes Exposed Structure Fascia Exposed: No Fat Layer (Subcutaneous Tissue) Exposed: Yes Tendon Exposed: No Muscle Exposed: No Joint Exposed: No Bone Exposed: No ion in Area: -25.5% ion in Volume: -25.5% alization: Small (1-33%) ng: No ning: No Notes carboflex Electronic Signature(s) Signed: 09/12/2019 3:47:19 PM By: Mikeal Hawthorne EMT/HBOT Signed: 09/13/2019 5:32:06 PM By: Levan Hurst RN, BSN Entered By: Mikeal Hawthorne on 09/12/2019  14:06:31 -------------------------------------------------------------------------------- Vitals Details Patient Name: Date of Service: Kathryn [name] 09/11/2019 8:45 AM Medical Record [id number] Patient Account Number: [id number] Date of Birth/Sex: Treating RN: 23-Jul-1931 (85 y.o. [name] Primary Care Ellard Nan: Eliezer Lofts Other Clinician: Referring Jillienne Egner: Treating Chloie Loney/Extender:Robson, [name], [name] in Treatment: 2 Vital Signs Time Taken: 08:52 Temperature (F): 97.9 Height (in): 67 Pulse (bpm): 68 Weight (lbs): 125 Respiratory Rate (breaths/min): 16 Body Mass Index (BMI): 19.6 Blood Pressure (mmHg): 156/86 Reference Range: 80 - 120 mg / dl Electronic Signature(s) Signed: 09/13/2019 5:32:06 PM By: Levan Hurst RN, BSN Entered By: Levan Hurst on 09/11/2019 08:52:27

## 2019-09-14 ENCOUNTER — Encounter (HOSPITAL_BASED_OUTPATIENT_CLINIC_OR_DEPARTMENT_OTHER): Payer: Medicare Other | Admitting: Internal Medicine

## 2019-09-14 ENCOUNTER — Other Ambulatory Visit: Payer: Self-pay

## 2019-09-14 ENCOUNTER — Other Ambulatory Visit (HOSPITAL_COMMUNITY)
Admission: RE | Admit: 2019-09-14 | Discharge: 2019-09-14 | Disposition: A | Discharge status: Home / Self Care | Payer: Medicare Other | Source: Other Acute Inpatient Hospital | Attending: Internal Medicine | Admitting: Internal Medicine

## 2019-09-14 DIAGNOSIS — L97222 Non-pressure chronic ulcer of left calf with fat layer exposed: Secondary | ICD-10-CM | POA: Diagnosis present

## 2019-09-14 DIAGNOSIS — I87303 Chronic venous hypertension (idiopathic) without complications of bilateral lower extremity: Secondary | ICD-10-CM | POA: Diagnosis not present

## 2019-09-14 NOTE — Progress Notes (Signed)
Kathryn [name], Kathryn [name] ([id number]) Visit Report for 09/14/2019 Arrival Information Details Patient Name: Date of Service: FABLE, MURE 09/14/2019 9:45 AM Medical Record [id number] Patient Account Number: [id number] Date of Birth/Sex: Treating RN: 05/10/1931 (85 y.o. Orvan Falconer Primary Care Zeyad Delaguila: Eliezer Lofts Other Clinician: Referring Sharnetta Gielow: Treating Lord Lancour/Extender:Robson, [name], [name] in Treatment: 2 Visit Information History Since Last Visit All ordered tests and consults were completed: No Patient Arrived: Ambulatory Added or deleted any medications: No Arrival Time: 10:51 Any new allergies or adverse reactions: No Accompanied By: self Had a fall or experienced change in No Transfer Assistance: None activities of daily living that may affect Patient Identification Verified: Yes risk of falls: Secondary Verification Process Completed: Yes Signs or symptoms of abuse/neglect since last No Patient Requires Transmission-Based No visito Precautions: Hospitalized since last visit: No Patient Has Alerts: No Implantable device outside of the clinic excluding No cellular tissue based products placed in the center since last visit: Has Dressing in Place as Prescribed: Yes Has Compression in Place as Prescribed: Yes Pain Present Now: Yes Electronic Signature(s) Signed: 09/14/2019 5:51:21 PM By: Carlene Coria RN Entered By: Carlene Coria on 09/14/2019 10:53:51 -------------------------------------------------------------------------------- Clinic Level of Care Assessment Details Patient Name: Date of Service: Kathryn [name] 09/14/2019 9:45 AM Medical Record [id number] Patient Account Number: [id number] Date of Birth/Sex: Treating RN: 28-Oct-1930 (85 y.o. [name] Primary Care Reese Stockman: Eliezer Lofts Other Clinician: Referring Karishma Unrein: Treating Hazel Leveille/Extender:Robson, [name], [name] in Treatment: 2 Clinic Level of  Care Assessment Items TOOL 4 Quantity Score X - Use when only an EandM is performed on FOLLOW-UP visit 1 0 ASSESSMENTS - Nursing Assessment / Reassessment X - Reassessment of Co-morbidities (includes updates in patient status) 1 10 X - Reassessment of Adherence to Treatment Plan 1 5 ASSESSMENTS - Wound and Skin Assessment / Reassessment X - Simple Wound Assessment / Reassessment - one wound 1 5 []  - Complex Wound Assessment / Reassessment - multiple wounds 0 []  - Dermatologic / Skin Assessment (not related to wound area) 0 ASSESSMENTS - Focused Assessment X - Circumferential Edema Measurements - multi extremities 1 5 []  - Nutritional Assessment / Counseling / Intervention 0 []  - Lower Extremity Assessment (monofilament, tuning fork, pulses) 0 []  - Peripheral Arterial Disease Assessment (using hand held doppler) 0 ASSESSMENTS - Ostomy and/or Continence Assessment and Care []  - Incontinence Assessment and Management 0 []  - Ostomy Care Assessment and Management (repouching, etc.) 0 PROCESS - Coordination of Care X - Simple Patient / Family Education for ongoing care 1 15 []  - Complex (extensive) Patient / Family Education for ongoing care 0 X - Staff obtains Programmer, systems, Records, Test Results / Process Orders 1 10 []  - Staff telephones HHA, Nursing Homes / Clarify orders / etc 0 []  - Routine Transfer to another Facility (non-emergent condition) 0 []  - Routine Hospital Admission (non-emergent condition) 0 []  - New Admissions / Biomedical engineer / Ordering NPWT, Apligraf, etc. 0 []  - Emergency Hospital Admission (emergent condition) 0 X - Simple Discharge Coordination 1 10 []  - Complex (extensive) Discharge Coordination 0 PROCESS - Special Needs []  - Pediatric / Minor Patient Management 0 []  - Isolation Patient Management 0 []  - Hearing / Language / Visual special needs 0 []  - Assessment of Community assistance (transportation, D/C planning, etc.) 0 []  - Additional assistance /  Altered mentation 0 []  - Support Surface(s) Assessment (bed, cushion, seat, etc.) 0 INTERVENTIONS - Wound Cleansing / Measurement X - Simple Wound Cleansing - one wound  1 5 []  - Complex Wound Cleansing - multiple wounds 0 X - Wound Imaging (photographs - any number of wounds) 1 5 []  - Wound Tracing (instead of photographs) 0 X - Simple Wound Measurement - one wound 1 5 []  - Complex Wound Measurement - multiple wounds 0 INTERVENTIONS - Wound Dressings X - Small Wound Dressing one or multiple wounds 1 10 []  - Medium Wound Dressing one or multiple wounds 0 []  - Large Wound Dressing one or multiple wounds 0 X - Application of Medications - topical 1 5 []  - Application of Medications - injection 0 INTERVENTIONS - Miscellaneous []  - External ear exam 0 X - Specimen Collection (cultures, biopsies, blood, body fluids, etc.) 1 5 X - Specimen(s) / Culture(s) sent or taken to Lab for analysis 1 5 []  - Patient Transfer (multiple staff / Civil Service fast streamer / Similar devices) 0 []  - Simple Staple / Suture removal (25 or less) 0 []  - Complex Staple / Suture removal (26 or more) 0 []  - Hypo / Hyperglycemic Management (close monitor of Blood Glucose) 0 []  - Ankle / Brachial Index (ABI) - do not check if billed separately 0 X - Vital Signs 1 5 Has the patient been seen at the hospital within the last three years: Yes Total Score: 105 Level Of Care: New/Established - Level 3 Electronic Signature(s) Signed: 09/14/2019 5:42:17 PM By: Kela Millin Entered By: Kela Millin on 09/14/2019 11:36:21 -------------------------------------------------------------------------------- Encounter Discharge Information Details Patient Name: Date of Service: Kathryn [name] 09/14/2019 9:45 AM Medical Record [id number] Patient Account Number: [id number] Date of Birth/Sex: Treating RN: [birth year] (85 y.o. [name] Primary Care Alvira Hecht: Eliezer Lofts Other Clinician: Referring Aydien Majette: Treating  Zoii Florer/Extender:Robson, [name], [name] in Treatment: 2 Encounter Discharge Information Items Discharge Condition: Stable Ambulatory Status: Ambulatory Discharge Destination: Home Transportation: Private Auto Accompanied By: self Schedule Follow-up Appointment: Yes Clinical Summary of Care: Electronic Signature(s) Signed: 09/14/2019 5:50:42 PM By: Deon Pilling Entered By: Deon Pilling on 09/14/2019 11:46:54 -------------------------------------------------------------------------------- Multi Wound Chart Details Patient Name: Date of Service: Kathryn [name] 09/14/2019 9:45 AM Medical Record [id number] Patient Account Number: [id number] Date of Birth/Sex: Treating RN: 1931/02/09 (85 y.o. F) Primary Care Elsie Sakuma: Eliezer Lofts Other Clinician: Referring Duanne Duchesne: Treating Nash Bolls/Extender:Robson, [name], [name] in Treatment: 2 Vital Signs Height(in): 67 Pulse(bpm): 74 Weight(lbs): 125 Blood Pressure(mmHg): 154/79 Body Mass Index(BMI): 20 Temperature(F): 98.3 Respiratory 20 Rate(breaths/min): Photos: [1:No Photos] [N/A:N/A] Wound Location: [1:Left Lower Leg - Medial] [N/A:N/A] Wounding Event: [1:Trauma] [N/A:N/A] Primary Etiology: [1:Venous Leg Ulcer] [N/A:N/A] Comorbid History: [1:Cataracts, Osteoarthritis, Confinement Anxiety] [N/A:N/A] Date Acquired: [1:05/29/2019] [N/A:N/A] Weeks of Treatment: [1:2] [N/A:N/A] Wound Status: [1:Open] [N/A:N/A] Measurements L x W x D [1:5.2x3x0.9] [N/A:N/A] (cm) Area (cm) : [1:12.252] [N/A:N/A] Volume (cm) : [1:11.027] [N/A:N/A] % Reduction in Area: [1:-22.60%] [N/A:N/A] % Reduction in Volume: [1:-267.90%] [N/A:N/A] Starting Position 1 [1:1] (o'clock): Ending Position 1 [1:4] (o'clock): Maximum Distance 1 [1:1] (cm): Undermining: [1:Yes] [N/A:N/A] Classification: [1:Full Thickness Without Exposed Support Structures] [N/A:N/A] Exudate Amount: [1:Medium] [N/A:N/A] Exudate Type:  [1:Serosanguineous] [N/A:N/A] Exudate Color: [1:red, brown] [N/A:N/A] Wound Margin: [1:Thickened] [N/A:N/A] Granulation Amount: [1:Small (1-33%)] [N/A:N/A] Granulation Quality: [1:Red] [N/A:N/A] Necrotic Amount: [1:Large (67-100%)] [N/A:N/A] Exposed Structures: [1:Fat Layer (Subcutaneous N/A Tissue) Exposed: Yes Fascia: No Tendon: No Muscle: No Joint: No Bone: No Small (1-33%)] [N/A:N/A] Treatment Notes Wound #1 (Left, Medial Lower Leg) 1. Cleanse With Wound Cleanser Soap and water 3. Primary Dressing Applied Calcium Alginate Ag 4. Secondary Dressing ABD Pad Dry Gauze Roll Gauze Other secondary dressing (specify  in notes) 5. Secured With Medipore tape Notes carboflex as secondary. netting. explained how to apply the dressing. patient in agreement. explained to patient if redness, pain, edema, or fever worsens or begins to go to the ED. Patient in agreement. Explained to patient to pick up abx today and start taking them. patient in agreement. Electronic Signature(s) Signed: 09/14/2019 6:01:02 PM By: Linton Ham MD Entered By: Linton Ham on 09/14/2019 12:13:59 -------------------------------------------------------------------------------- Multi-Disciplinary Care Plan Details Patient Name: Date of Service: Kathryn [name] 09/14/2019 9:45 AM Medical Record [id number] Patient Account Number: [id number] Date of Birth/Sex: Treating RN: [birth year] (85 y.o. [name] Primary Care Lygia Olaes: Eliezer Lofts Other Clinician: Referring Willadean Guyton: Treating Esaw Knippel/Extender:Robson, [name], [name] in Treatment: 2 Active Inactive Wound/Skin Impairment Nursing Diagnoses: Knowledge deficit related to ulceration/compromised skin integrity Goals: Patient/caregiver will verbalize understanding of skin care regimen Date Initiated: 08/28/2019 Target Resolution Date: 10/05/2019 Goal Status: Active Ulcer/skin breakdown will have a volume reduction of 30% by  week 4 Date Initiated: 08/28/2019 Target Resolution Date: 10/05/2019 Goal Status: Active Interventions: Assess patient/caregiver ability to obtain necessary supplies Assess patient/caregiver ability to perform ulcer/skin care regimen upon admission and as needed Assess ulceration(s) every visit Notes: Electronic Signature(s) Signed: 09/14/2019 5:42:17 PM By: Kela Millin Entered By: Kela Millin on 09/14/2019 11:01:12 -------------------------------------------------------------------------------- Pain Assessment Details Patient Name: Date of Service: REISHA, TIEGS 09/14/2019 9:45 AM Medical Record [id number] Patient Account Number: [id number] Date of Birth/Sex: Treating RN: [birth year] (85 y.o. Orvan Falconer Primary Care Yehoshua Vitelli: Eliezer Lofts Other Clinician: Referring Lenardo Westwood: Treating Jesson Foskey/Extender:Robson, [name], [name] in Treatment: 2 Active Problems Location of Pain Severity and Description of Pain Patient Has Paino Yes Site Locations With Dressing Change: Yes Duration of the Pain. Constant / Intermittento Intermittent Rate the pain. Current Pain Level: 8 Worst Pain Level: 10 Least Pain Level: 0 Tolerable Pain Level: 5 Character of Pain Describe the Pain: Aching, Burning Pain Management and Medication Current Pain Management: Medication: Yes Cold Application: No Rest: Yes Massage: No Activity: No T.E.N.S.: No Heat Application: No Leg drop or elevation: No Is the Current Pain Management Adequate: Inadequate How does your wound impact your activities of daily livingo Sleep: Yes Bathing: No Appetite: No Relationship With Others: No Bladder Continence: No Emotions: No Bowel Continence: No Work: No Toileting: No Drive: No Dressing: No Hobbies: No Electronic Signature(s) Signed: 09/14/2019 5:51:21 PM By: Carlene Coria RN Entered By: Carlene Coria on 09/14/2019  10:56:08 -------------------------------------------------------------------------------- Patient/Caregiver Education Details Patient Name: Date of Service: Kathryn [name] 12/18/2020andnbsp9:45 AM Medical Record [id number] Patient Account Number: [id number] Date of Birth/Gender: February 27, 1931 (85 y.o. F) Treating RN: Carlene Coria Primary Care Physician: Eliezer Lofts Other Clinician: Referring Physician: Treating Physician/Extender:Robson, [name], [name] in Treatment: 2 Education Assessment Education Provided To: Patient Education Topics Provided Wound/Skin Impairment: Handouts: Skin Care Do's and Dont's Methods: Explain/Verbal Responses: Reinforcements needed Electronic Signature(s) Signed: 09/14/2019 5:42:17 PM By: Kela Millin Entered By: Kela Millin on 09/14/2019 11:01:23 -------------------------------------------------------------------------------- Wound Assessment Details Patient Name: Date of Service: TIERNAN, ODONALD 09/14/2019 9:45 AM Medical Record [id number] Patient Account Number: [id number] Date of Birth/Sex: Treating RN: [birth year] (85 y.o. Orvan Falconer Primary Care Lenda Baratta: Eliezer Lofts Other Clinician: Referring Tessy Pawelski: Treating Kyrra Prada/Extender:Robson, [name], [name] in Treatment: 2 Wound Status Wound Number: 1 Primary Venous Leg Ulcer Etiology: Wound Location: Left Lower Leg - Medial Wound Status: Open Wounding Event: Trauma Comorbid Cataracts, Osteoarthritis, Confinement Date Acquired: 05/29/2019 History: Anxiety Weeks Of Treatment: 2 Clustered Wound: No Wound Measurements Length: (  cm) 5.2 Width: (cm) 3 Depth: (cm) 0.9 Area: (cm) 12.252 Volume: (cm) 11.027 % Reduction in Area: -22.6% % Reduction in Volume: -267.9% Epithelialization: Small (1-33%) Tunneling: No Undermining: Yes Starting Position (o'clock): 1 Ending Position (o'clock): 4 Maximum Distance: (cm) 1 Wound Description Full  Thickness Without Exposed Support Foul Od Classification: Structures Slough/ Wound Thickened Margin: Exudate Medium Amount: Exudate Serosanguineous Type: Exudate Exudate red, brown Color: Wound Bed Granulation Amount: Small (1-33%) Granulation Quality: Red Fascia Necrotic Amount: Large (67-100%) Fat La Necrotic Quality: Adherent Slough Tendon Muscle Joint Bone E or After Cleansing: No Fibrino Yes Exposed Structure Exposed: No yer (Subcutaneous Tissue) Exposed: Yes Exposed: No Exposed: No Exposed: No xposed: No Treatment Notes Wound #1 (Left, Medial Lower Leg) 1. Cleanse With Wound Cleanser Soap and water 3. Primary Dressing Applied Calcium Alginate Ag 4. Secondary Dressing ABD Pad Dry Gauze Roll Gauze Other secondary dressing (specify in notes) 5. Secured With Medipore tape Notes carboflex as secondary. netting. explained how to apply the dressing. patient in agreement. explained to patient if redness, pain, edema, or fever worsens or begins to go to the ED. Patient in agreement. Explained to patient to pick up abx today and start taking them. patient in agreement. Electronic Signature(s) Signed: 09/14/2019 5:51:21 PM By: Carlene Coria RN Entered By: Carlene Coria on 09/14/2019 10:57:01 -------------------------------------------------------------------------------- Vitals Details Patient Name: Date of Service: Kathryn [name] 09/14/2019 9:45 AM Medical Record [id number] Patient Account Number: [id number] Date of Birth/Sex: Treating RN: 1931/03/04 (85 y.o. Orvan Falconer Primary Care Altheia Shafran: Eliezer Lofts Other Clinician: Referring Addilee Neu: Treating Manolo Bosket/Extender:Robson, [name], [name] in Treatment: 2 Vital Signs Time Taken: 10:44 Temperature (F): 98.3 Height (in): 67 Pulse (bpm): 74 Weight (lbs): 125 Respiratory Rate (breaths/min): 20 Body Mass Index (BMI): 19.6 Blood Pressure (mmHg): 154/79 Reference Range: 80 - 120  mg / dl Electronic Signature(s) Signed: 09/14/2019 5:51:21 PM By: Carlene Coria RN Entered By: Carlene Coria on 09/14/2019 10:54:16

## 2019-09-16 LAB — AEROBIC CULTURE W GRAM STAIN (SUPERFICIAL SPECIMEN)

## 2019-09-16 LAB — AEROBIC CULTURE? (SUPERFICIAL SPECIMEN)

## 2019-09-17 ENCOUNTER — Other Ambulatory Visit: Payer: Self-pay

## 2019-09-17 ENCOUNTER — Encounter (HOSPITAL_BASED_OUTPATIENT_CLINIC_OR_DEPARTMENT_OTHER): Payer: Medicare Other | Admitting: Internal Medicine

## 2019-09-17 DIAGNOSIS — I87303 Chronic venous hypertension (idiopathic) without complications of bilateral lower extremity: Secondary | ICD-10-CM | POA: Diagnosis not present

## 2019-09-18 ENCOUNTER — Ambulatory Visit (HOSPITAL_BASED_OUTPATIENT_CLINIC_OR_DEPARTMENT_OTHER): Payer: Medicare Other | Admitting: Internal Medicine

## 2019-09-18 NOTE — Progress Notes (Signed)
Kathryn [name], Kathryn [name] ([id number]) Visit Report for 09/14/2019 HPI Details Patient Name: Date of Service: Kathryn [name], Kathryn [name] 09/14/2019 9:45 AM Medical Record [id number] Patient Account Number: [id number] Date of Birth/Sex: Treating RN: [name] (85 y.o. F) Primary Care Provider: Eliezer [name] Other Clinician: Referring Provider: Treating Provider/Extender:Kathryn [name], Kathryn [name], Kathryn [name] in Treatment: 2 History of Present Illness HPI Description: ADMISSION 08/28/19 This is an 85 year old woman who is still very active still working full-time at [employer] Sometime in late September her left calf was hit by the harness of her dog who was chasing another person. She was left with a fairly clear skin tear she tried to pull the skin back over the wound but it it did not maintain. She was seen in her primary doctor's office on 07/20/2019 she was given a topical antibiotic and cephalexin. She was reviewed in primary care on 11/3 and it was felt the wound might need debridement and she was sent here. The patient has chronic venous insufficiency however she does not have a history of recurrent chronic wounds. Past medical history includes cellulitis of the left foot, hyperlipidemia, irregular heart rate, colonic polyps., Low back pain, B12 deficiency, Dupuytren's contractures, recurrent UTIs she has a history of bilateral hip surgery for osteoarthritis ABI in our clinic was 0.94 on the left 12/8; deep laceration wound on the left medial mid tibia. We are using Iodoflex under compression. She is not eligible for home health 12/15; deep laceration wound on the left mid tibia. We are using Iodoflex under compression. Perhaps some minimal improvement in wound surface certainly no improvement in surface area 12/18; the patient called in to report drainage coming out of the wound and increasing pain. She was brought in for a nurse visit. Noted to have serosanguineous drainage and I was asked to look  at this. Electronic Signature(s) Signed: 09/14/2019 6:01:02 PM By: Kathryn Ham MD Entered By: Kathryn [name] on 09/14/2019 12:14:41 -------------------------------------------------------------------------------- Physical Exam Details Patient Name: Date of Service: Kathryn [name] 09/14/2019 9:45 AM Medical Record [id number] Patient Account Number: [id number] Date of Birth/Sex: Treating RN: Kathryn [name] (85 y.o. F) Primary Care Provider: Eliezer [name] Other Clinician: Referring Provider: Treating Provider/Extender:Kathryn [name], Kathryn [name], Kathryn [name] in Treatment: 2 Constitutional Patient is hypertensive.. Pulse regular and within target range for patient.Marland Kitchen Respirations regular, non-labored and within target range.. Temperature is normal and within the target range for the patient.Marland Kitchen Appears in no distress. Respiratory work of breathing is normal. Cardiovascular Pedal pulses palpable and strong bilaterally.. Integumentary (Hair, Skin) Marked erythema around the wound. Psychiatric appears at normal baseline. Notes Wound exam; this is on the left medial calf. The wound surface paradoxically looks quite good however there is erythema spreading circumferentially around the wound that is tender. Our nurses obtained a culture of the serosanguineous drainage. Electronic Signature(s) Signed: 09/14/2019 6:01:02 PM By: Kathryn Ham MD Entered By: Kathryn [name] on 09/14/2019 12:15:53 -------------------------------------------------------------------------------- Physician Orders Details Patient Name: Date of Service: Kathryn [name] 09/14/2019 9:45 AM Medical Record [id number] Patient Account Number: [id number] Date of Birth/Sex: Treating RN: Kathryn [name] (85 y.o. Kathryn [name] Primary Care Provider: Eliezer [name] Other Clinician: Referring Provider: Treating Provider/Extender:Kathryn [name], Kathryn [name], Kathryn [name] in Treatment: 2 Verbal / Phone Orders:  No Diagnosis Coding ICD-10 Coding Code Description I87.303 Chronic venous hypertension (idiopathic) without complications of bilateral lower extremity S81.812D Laceration without foreign body, left lower leg, subsequent encounter L97.222 Non-pressure chronic ulcer of left calf with fat layer exposed Follow-up Appointments Return Appointment in 1 week. - Monday afternoon Dressing Change  Frequency Change dressing every day. - Please provide with supplies Wound Cleansing Wound #1 Left,Medial Lower Leg Kathryn shower with protection. Primary Wound Dressing Wound #1 Left,Medial Lower Leg Calcium Alginate with Silver Secondary Dressing Wound #1 Left,Medial Lower Leg Kerlix/Rolled Gauze Dry Gauze ABD pad Carboflex Laboratory Bacteria identified in Unspecified specimen by Anaerobe culture (MICRO) - (ICD10 [id number] - Non- pressure chronic ulcer of left calf with fat layer exposed) LOINC Code: [id number] Convenience Name: Anerobic culture Patient Medications Allergies: Macrobid Notifications Medication Indication Start End doxycycline monohydrate wound infection 09/14/2019 DOSE oral 100 mg capsule - 1capsule oral bid for 7 days Electronic Signature(s) Signed: 09/14/2019 5:42:17 PM By: Kathryn [name] Signed: 09/14/2019 6:01:02 PM By: Kathryn Ham MD Previous Signature: 09/14/2019 11:33:36 AM Version By: Kathryn Ham MD Entered By: Kathryn [name] on 09/14/2019 17:29:18 -------------------------------------------------------------------------------- Problem List Details Patient Name: Date of Service: Kathryn [name] 09/14/2019 9:45 AM Medical Record [id number] Patient Account Number: [id number] Date of Birth/Sex: Treating RN: Kathryn [name] (85 y.o. Kathryn [name], Kathryn [name] Primary Care Provider: Eliezer [name] Other Clinician: Referring Provider: Treating Provider/Extender:Kathryn [name], Kathryn [name], Kathryn [name] in Treatment: 2 Active Problems ICD-10 Evaluated Encounter Code  Description Active Date Today Diagnosis I87.303 Chronic venous hypertension (idiopathic) without 99991111 No Yes complications of bilateral lower extremity S81.812D Laceration without foreign body, left lower leg, 08/28/2019 No Yes subsequent encounter L97.222 Non-pressure chronic ulcer of left calf with fat layer 08/28/2019 No Yes exposed L03.116 Cellulitis of left lower limb 09/14/2019 No Yes Inactive Problems Resolved Problems Electronic Signature(s) Signed: 09/14/2019 6:01:02 PM By: Kathryn Ham MD Entered By: Kathryn [name] on 09/14/2019 12:13:43 -------------------------------------------------------------------------------- Progress Note Details Patient Name: Date of Service: Kathryn [name] 09/14/2019 9:45 AM Medical Record [id number] Patient Account Number: [id number] Date of Birth/Sex: Treating RN: [name] (85 y.o. F) Primary Care Provider: Eliezer [name] Other Clinician: Referring Provider: Treating Provider/Extender:Jamesyn Lindell, Kathryn [name], Kathryn [name] in Treatment: 2 Subjective History of Present Illness (HPI) ADMISSION 08/28/19 This is an 85 year old woman who is still very active still working full-time at [employer] Sometime in late September her left calf was hit by the harness of her dog who was chasing another person. She was left with a fairly clear skin tear she tried to pull the skin back over the wound but it it did not maintain. She was seen in her primary doctor's office on 07/20/2019 she was given a topical antibiotic and cephalexin. She was reviewed in primary care on 11/3 and it was felt the wound might need debridement and she was sent here. The patient has chronic venous insufficiency however she does not have a history of recurrent chronic wounds. Past medical history includes cellulitis of the left foot, hyperlipidemia, irregular heart rate, colonic polyps., Low back pain, B12 deficiency, Dupuytren's contractures, recurrent UTIs she has a  history of bilateral hip surgery for osteoarthritis ABI in our clinic was 0.94 on the left 12/8; deep laceration wound on the left medial mid tibia. We are using Iodoflex under compression. She is not eligible for home health 12/15; deep laceration wound on the left mid tibia. We are using Iodoflex under compression. Perhaps some minimal improvement in wound surface certainly no improvement in surface area 12/18; the patient called in to report drainage coming out of the wound and increasing pain. She was brought in for a nurse visit. Noted to have serosanguineous drainage and I was asked to look at this. Objective Constitutional Patient is hypertensive.. Pulse regular and within target range for patient.Marland Kitchen Respirations regular, non-labored and within target range.. Temperature  is normal and within the target range for the patient.Marland Kitchen Appears in no distress. Vitals Time Taken: 10:44 AM, Height: 67 in, Weight: 125 lbs, BMI: 19.6, Temperature: 98.3 F, Pulse: 74 bpm, Respiratory Rate: 20 breaths/min, Blood Pressure: 154/79 mmHg. Respiratory work of breathing is normal. Cardiovascular Pedal pulses palpable and strong bilaterally.Marland Kitchen Psychiatric appears at normal baseline. General Notes: Wound exam; this is on the left medial calf. The wound surface paradoxically looks quite good however there is erythema spreading circumferentially around the wound that is tender. Our nurses obtained a culture of the serosanguineous drainage. Integumentary (Hair, Skin) Marked erythema around the wound. Wound #1 status is Open. Original cause of wound was Trauma. The wound is located on the Left,Medial Lower Leg. The wound measures 5.2cm length x 3cm width x 0.9cm depth; 12.252cm^2 area and 11.027cm^3 volume. There is Fat Layer (Subcutaneous Tissue) Exposed exposed. There is no tunneling noted, however, there is undermining starting at 1:00 and ending at 4:00 with a maximum distance of 1cm. There is a medium  amount of serosanguineous drainage noted. The wound margin is thickened. There is small (1-33%) red granulation within the wound bed. There is a large (67-100%) amount of necrotic tissue within the wound bed including Adherent Slough. Assessment Active Problems ICD-10 Chronic venous hypertension (idiopathic) without complications of bilateral lower extremity Laceration without foreign body, left lower leg, subsequent encounter Non-pressure chronic ulcer of left calf with fat layer exposed Cellulitis of left lower limb Plan Follow-up Appointments: Return Appointment in 1 week. - Monday afternoon Dressing Change Frequency: Change dressing every day. - Please provide with supplies Wound Cleansing: Wound #1 Left,Medial Lower Leg: Kathryn shower with protection. Primary Wound Dressing: Wound #1 Left,Medial Lower Leg: Calcium Alginate with Silver Secondary Dressing: Wound #1 Left,Medial Lower Leg: Kerlix/Rolled Gauze Dry Gauze ABD pad Carboflex Laboratory ordered were: Anerobic culture, Left lower leg The following medication(s) was prescribed: doxycycline monohydrate oral 100 mg capsule 1capsule oral bid for 7 days for wound infection starting 09/14/2019 1. Left medial lower leg. Clear evidence of cellulitis. Culture done and started on doxycycline 100 twice daily for 7 days 2. Change the primary dressing to silver alginate because of infection with kerlix wrap she will change this. We marked the area of erythema and cautioned that she Kathryn need to seek more urgent medical attention if the area extends Electronic Signature(s) Signed: 09/14/2019 6:01:02 PM By: Kathryn Ham MD Signed: 09/18/2019 6:26:19 PM By: Levan Hurst RN, BSN Entered By: Levan Hurst on 09/14/2019 17:29:40 -------------------------------------------------------------------------------- [place] Details Patient Name: Date of Service: Kathryn [name] 09/14/2019 Medical Record [id number] Patient  Account Number: [id number] Date of Birth/Sex: Treating RN: [birth year] (85 y.o. Kathryn [name] Primary Care Provider: Eliezer [name] Other Clinician: Referring Provider: Treating Provider/Extender:Amilyah Nack, Kathryn [name], Kathryn [name] in Treatment: 2 Diagnosis Coding ICD-10 Codes Code Description I87.303 Chronic venous hypertension (idiopathic) without complications of bilateral lower extremity S81.812D Laceration without foreign body, left lower leg, subsequent encounter L97.222 Non-pressure chronic ulcer of left calf with fat layer exposed L03.116 Cellulitis of left lower limb Facility Procedures CPT4 Code: [id number] Description: 99213 - WOUND CARE VISIT-LEV 3 EST PT Modifier: Quantity: 1 Physician Procedures CPT4 Code Description: [id number] - WC PHYS LEVEL 3 - EST PT ICD-10 Diagnosis Description I87.303 Chronic venous hypertension (idiopathic) without complicat extremity [id number] Laceration without foreign body, left lower leg, subsequen L03.116  Cellulitis of left lower limb Modifier: ions of bilatera t encounter Quantity: 1 l lower Electronic Signature(s) Signed: 09/14/2019 6:01:02 PM By: Kathryn Ham MD  Entered By: Kathryn [name] on 09/14/2019 12:17:15

## 2019-09-18 NOTE — Progress Notes (Addendum)
Kathryn [name], Kathryn [name] ([id number]) Visit Report for 09/17/2019 Arrival Information Details Patient Name: Date of Service: Kathryn [name], Kathryn [name] 09/17/2019 10:45 AM Medical Record [id number] Patient Account Number: [id number] Date of Birth/Sex: Treating RN: [birth year] (85 y.o. Kathryn [name], Kathryn [name] Primary Care Belton Peplinski: Kathryn [name] Other Clinician: Referring Ariauna Farabee: Treating Kathryn [name]/Extender:Kathryn [name]: 2 Visit Information History Since Last Visit Added or deleted any medications: No Patient Arrived: Ambulatory Any new allergies or adverse reactions: No Arrival Time: 10:45 Had a fall or experienced change in No Accompanied By: friend activities of daily living that may affect Transfer Assistance: None risk of falls: Patient Identification Verified: Yes Signs or symptoms of abuse/neglect since last No Secondary Verification Process Completed: Yes visito Patient Requires Transmission-Based No Hospitalized since last visit: No Precautions: Implantable device outside of the clinic excluding No Patient Has Alerts: No cellular tissue based products placed in the center since last visit: Has Dressing in Place as Prescribed: Yes Pain Present Now: No Electronic Signature(s) Signed: 09/17/2019 5:06:44 PM By: Kathryn [name] Entered By: Kathryn [name] on 09/17/2019 10:55:24 -------------------------------------------------------------------------------- Compression Therapy Details Patient Name: Date of Service: Kathryn [name], Kathryn [name] 09/17/2019 10:45 AM Medical Record [id number] Patient Account Number: [id number] Date of Birth/Sex: Treating RN: [name] (85 y.o. Kathryn [name] Primary Care Fifi Schindler: Kathryn [name] Other Clinician: Referring Batul Diego: Treating Kathryn [name]/Extender:Kathryn [name]: 2 Compression Therapy Performed for Wound Wound #1 Left,Medial Lower Leg Assessment: Performed By: Clinician Kathryn Pilling,  RN Compression Type: Three Layer Post Procedure Diagnosis Same as Pre-procedure Electronic Signature(s) Signed: 09/17/2019 5:05:42 PM By: Kathryn [name] Entered By: Kathryn Gouty on 09/17/2019 11:15:28 -------------------------------------------------------------------------------- Encounter Discharge Information Details Patient Name: Date of Service: Kathryn [name], Kathryn [name] 09/17/2019 10:45 AM Medical Record [id number] Patient Account Number: [id number] Date of Birth/Sex: Treating RN: Kathryn [name] (85 y.o. Kathryn [name] Primary Care Raoul Ciano: Kathryn [name] Other Clinician: Referring Kathryn [name]: Treating Kathryn [name]:Kathryn [name]: 2 Encounter Discharge Information Items Discharge Condition: Stable Ambulatory Status: Ambulatory Discharge Destination: Home Transportation: Private Auto Accompanied By: friend Schedule Follow-up Appointment: Yes Clinical Summary of Care: Electronic Signature(s) Signed: 09/17/2019 5:06:44 PM By: Kathryn [name] Entered By: Kathryn [name] on 09/17/2019 11:32:51 -------------------------------------------------------------------------------- Lower Extremity Assessment Details Patient Name: Date of Service: Kathryn [name], Kathryn [name] 09/17/2019 10:45 AM Medical Record [id number] Patient Account Number: [id number] Date of Birth/Sex: Treating RN: [name] (85 y.o. Kathryn [name] Primary Care Vinaya Sancho: Kathryn [name] Other Clinician: Referring Baraa Tubbs: Treating Kathryn [name]:Kathryn [name]: 2 Edema Assessment Assessed: [Left: Yes] [Right: No] Edema: [Left: Ye] [Right: s] Calf Left: Right: Point of Measurement: cm From Medial Instep 33.5 cm cm Ankle Left: Right: Point of Measurement: cm From Medial Instep 21 cm cm Vascular Assessment Pulses: Dorsalis Pedis Palpable: [Left:Yes] Electronic Signature(s) Signed: 09/17/2019 5:06:44 PM By: Kathryn [name] Entered By:  Kathryn [name] on 09/17/2019 10:56:06 -------------------------------------------------------------------------------- Multi Wound Chart Details Patient Name: Date of Service: Kathryn [name] 09/17/2019 10:45 AM Medical Record [id number] Patient Account Number: [id number] Date of Birth/Sex: Treating RN: [birth year] (85 y.o. F) Primary Care Tristain Daily: Kathryn [name] Other Clinician: Referring Jonai Weyland: Treating Raymonde Hamblin/Extender:Kathryn [name]: 2 Vital Signs Height(in): 67 Pulse(bpm): 77 Weight(lbs): 125 Blood Pressure(mmHg): 166/88 Body Mass Index(BMI): 20 Temperature(F): 98.6 Respiratory 20 Rate(breaths/min): Photos: [1:No Photos] [N/A:N/A] Wound Location: [1:Left Lower Leg - Medial] [N/A:N/A] Wounding Event: [1:Trauma] [N/A:N/A] Primary Etiology: [1:Venous Leg Ulcer] [N/A:N/A] Comorbid History: [1:Cataracts, Osteoarthritis, N/A Confinement Anxiety] Date Acquired: [1:05/29/2019] [N/A:N/A] [name] of [name]: [1:2] [N/A:N/A] Wound  Status: [1:Open] [N/A:N/A] Measurements L x W x D 7.6x3.6x0.2 [N/A:N/A] (cm) Area (cm) : [1:21.488] [N/A:N/A] Volume (cm) : [1:4.298] [N/A:N/A] % Reduction in Area: [1:-115.10%] [N/A:N/A] % Reduction in Volume: -43.40% [N/A:N/A] Classification: [1:Full Thickness Without Exposed Support Structures] [N/A:N/A] Exudate Amount: [1:Medium] [N/A:N/A] Exudate Type: [1:Serosanguineous] [N/A:N/A] Exudate Color: [1:red, brown] [N/A:N/A] Wound Margin: [1:Flat and Intact] [N/A:N/A] Granulation Amount: [1:Small (1-33%)] [N/A:N/A] Granulation Quality: [1:Red] [N/A:N/A] Necrotic Amount: [1:Large (67-100%)] [N/A:N/A] Exposed Structures: [1:Fat Layer (Subcutaneous Tissue) Exposed: Yes Fascia: No Tendon: No Muscle: No Joint: No Bone: No] [N/A:N/A] Epithelialization: [1:Small (1-33%) Compression Therapy] [N/A:N/A N/A] [name] Notes Electronic Signature(s) Signed: 09/18/2019 7:47:18 AM By: Linton Ham MD Entered By:  Linton Ham on 09/17/2019 11:18:39 -------------------------------------------------------------------------------- Multi-Disciplinary Care Plan Details Patient Name: Date of Service: Kathryn [name] 09/17/2019 10:45 AM Medical Record [id number] Patient Account Number: [id number] Date of Birth/Sex: Treating RN: [name] (85 y.o. Kathryn [name] Primary Care Kasidee Voisin: Kathryn [name] Other Clinician: Referring Vici Novick: Treating Kadesia Robel/Extender:Kathryn [name]: 2 Active Inactive Wound/Skin Impairment Nursing Diagnoses: Knowledge deficit related to ulceration/compromised skin integrity Goals: Patient/caregiver will verbalize understanding of skin care regimen Date Initiated: 08/28/2019 Target Resolution Date: 10/05/2019 Goal Status: Active Ulcer/skin breakdown will have a volume reduction of 30% by week 4 Date Initiated: 08/28/2019 Target Resolution Date: 10/05/2019 Goal Status: Active Interventions: Assess patient/caregiver ability to obtain necessary supplies Assess patient/caregiver ability to perform ulcer/skin care regimen upon admission and as needed Assess ulceration(s) every visit Notes: Electronic Signature(s) Signed: 09/17/2019 5:05:42 PM By: Kathryn [name] Entered By: Kathryn Gouty on 09/17/2019 11:10:18 -------------------------------------------------------------------------------- Pain Assessment Details Patient Name: Date of Service: Kathryn [name] 09/17/2019 10:45 AM Medical Record [id number] Patient Account Number: [id number] Date of Birth/Sex: Treating RN: [birth year] [name] (85 y.o. Kathryn [name] Primary Care Lai Hendriks: Kathryn [name] Other Clinician: Referring Ilya Neely: Treating Sherill Wegener/Extender:Kathryn [name]: 2 Active Problems Location of Pain Severity and Description of Pain Patient Has Paino No Site Locations Rate the pain. Current Pain Level: 0 Pain Management  and Medication Current Pain Management: Medication: No Cold Application: No Rest: No Massage: No Activity: No T.E.N.S.: No Heat Application: No Leg drop or elevation: No Is the Current Pain Management Adequate: Adequate How does your wound impact your activities of daily livingo Sleep: No Bathing: No Appetite: No Relationship With Others: No Bladder Continence: No Emotions: No Bowel Continence: No Work: No Toileting: No Drive: No Dressing: No Hobbies: No Electronic Signature(s) Signed: 09/17/2019 5:06:44 PM By: Kathryn [name] Entered By: Kathryn [name] on 09/17/2019 10:55:56 -------------------------------------------------------------------------------- Patient/Caregiver Education Details Patient Name: Date of Service: Kathryn [name] 12/21/2020andnbsp10:45 AM Medical Record Patient Account Number: [id number] Number: Treating RN: Kathryn Gouty Date of Birth/Gender: [name] (85 y.o. F) Other Clinician: Primary Care Physician: Kathryn [name] Treating Linton Ham Referring Physician: Physician/Extender: Leslye Peer in [name]: 2 Education Assessment Education Provided To: Patient Education Topics Provided Venous: Methods: Explain/Verbal Responses: Reinforcements needed, State content correctly Wound/Skin Impairment: Methods: Explain/Verbal Responses: Reinforcements needed, State content correctly Electronic Signature(s) Signed: 09/17/2019 5:05:42 PM By: Kathryn [name] Entered By: Kathryn Gouty on 09/17/2019 11:15:01 -------------------------------------------------------------------------------- Wound Assessment Details Patient Name: Date of Service: Kathryn [name] 09/17/2019 10:45 AM Medical Record [id number] Patient Account Number: [id number] Date of Birth/Sex: Treating RN: [name] (85 y.o. Kathryn [name] Primary Care Ashanty Coltrane: Kathryn [name] Other Clinician: Referring Alenna Russell: Treating  Ambree Frances/Extender:Kathryn [name]: 2 Wound Status Wound Number: 1 Primary Venous Leg Ulcer Etiology: Wound Location: Left Lower Leg -  Medial Wound Status: Open Wounding Event: Trauma Comorbid Cataracts, Osteoarthritis, Confinement Date Acquired: 05/29/2019 History: Anxiety [name] Of [name]: 2 Clustered Wound: No Photos Wound Measurements Length: (cm) 7.6 % Reduct Width: (cm) 3.6 % Reduct Depth: (cm) 0.2 Epitheli Area: (cm) 21.488 Tunneli Volume: (cm) 4.298 Undermi Wound Description Full Thickness Without Exposed Support Foul Odo Classification: Structures Slough/F Wound Flat and Intact Margin: Exudate Medium Amount: Exudate Serosanguineous Type: Exudate red, brown Color: Wound Bed Granulation Amount: Small (1-33%) Granulation Quality: Red Fascia E Necrotic Amount: Large (67-100%) Fat Laye Necrotic Quality: Adherent Slough Tendon E Muscle E Joint Ex Bone Exp r After Cleansing: No ibrino Yes Exposed Structure xposed: No r (Subcutaneous Tissue) Exposed: Yes xposed: No xposed: No posed: No osed: No ion in Area: -115.1% ion in Volume: -43.4% alization: Small (1-33%) ng: No ning: No [name] Notes Wound #1 (Left, Medial Lower Leg) 1. Cleanse With Wound Cleanser 2. Periwound Care Moisturizing lotion 3. Primary Dressing Applied Calcium Alginate Ag 4. Secondary Dressing ABD Pad Dry Gauze 6. Support Layer Applied 3 layer compression wrap Notes carboflex as secondary. netting. Electronic Signature(s) Signed: 09/19/2019 3:48:24 PM By: Mikeal Hawthorne EMT/HBOT Signed: 09/19/2019 4:41:56 PM By: Kathryn [name] Previous Signature: 09/17/2019 5:06:44 PM Version By: Kathryn [name] Entered By: Mikeal Hawthorne on 09/19/2019 09:32:40 -------------------------------------------------------------------------------- Vitals Details Patient Name: Date of Service: Kathryn [name] 09/17/2019 10:45 AM Medical Record [id number]  Patient Account Number: [id number] Date of Birth/Sex: Treating RN: [birth year] (85 y.o. Kathryn [name] Primary Care Blase Beckner: Kathryn [name] Other Clinician: Referring Kailah Pennel: Treating Maverick Dieudonne/Extender:Kathryn [name]: 2 Vital Signs Time Taken: 10:48 Temperature (F): 98.6 Height (in): 67 Pulse (bpm): 77 Weight (lbs): 125 Respiratory Rate (breaths/min): 20 Body Mass Index (BMI): 19.6 Blood Pressure (mmHg): 166/88 Reference Range: 80 - 120 mg / dl Electronic Signature(s) Signed: 09/17/2019 5:06:44 PM By: Kathryn [name] Entered By: Kathryn [name] on 09/17/2019 10:55:42

## 2019-09-18 NOTE — Progress Notes (Signed)
[name], [name] ([id number]) Visit Report for 09/17/2019 HPI Details Patient Name: Date of Service: Kathryn [name], Kathryn [name] 09/17/2019 10:45 AM Medical Record [id number] Patient Account Number: [id number] Date of Birth/Sex: Treating RN: 1930-12-30 (85 y.o. F) Primary Care Provider: Eliezer Lofts Other Clinician: Referring Provider: Treating Provider/Extender:Kari Montero, [name], [name] in Treatment: 2 History of Present Illness HPI Description: ADMISSION 08/28/19 This is an 85 year old woman who is still very active still working full-time at [employer] Sometime in late September her left calf was hit by the harness of her dog who was chasing another person. She was left with a fairly clear skin tear she tried to pull the skin back over the wound but it it did not maintain. She was seen in her primary doctor's office on 07/20/2019 she was given a topical antibiotic and cephalexin. She was reviewed in primary care on 11/3 and it was felt the wound might need debridement and she was sent here. The patient has chronic venous insufficiency however she does not have a history of recurrent chronic wounds. Past medical history includes cellulitis of the left foot, hyperlipidemia, irregular heart rate, colonic polyps., Low back pain, B12 deficiency, Dupuytren's contractures, recurrent UTIs she has a history of bilateral hip surgery for osteoarthritis ABI in our clinic was 0.94 on the left 12/8; deep laceration wound on the left medial mid tibia. We are using Iodoflex under compression. She is not eligible for home health 12/15; deep laceration wound on the left mid tibia. We are using Iodoflex under compression. Perhaps some minimal improvement in wound surface certainly no improvement in surface area 12/18; the patient called in to report drainage coming out of the wound and increasing pain. She was brought in for a nurse visit. Noted to have serosanguineous drainage and I was asked to look  at this. 12/21; 3-day follow-up. Culture I did on 12/18 grew multiple organisms but none predominated. In spite of this the doxycycline really seems to have helped there is much less surrounding erythema. We allowed her to change the dressing over the weekend. The patient still works at [employer] There is edema around the wound because we did not wrap her last time. I wanted her to be able to continue to monitor the degree of erythema Electronic Signature(s) Signed: 09/18/2019 7:47:18 AM By: Linton Ham MD Entered By: Linton Ham on 09/17/2019 11:19:34 -------------------------------------------------------------------------------- Physical Exam Details Patient Name: Date of Service: Kathryn [name] 09/17/2019 10:45 AM Medical Record [id number] Patient Account Number: [id number] Date of Birth/Sex: Treating RN: Oct 08, 1930 (85 y.o. F) Primary Care Provider: Eliezer Lofts Other Clinician: Referring Provider: Treating Provider/Extender:Keni Wafer, [name], [name] in Treatment: 2 Constitutional Patient is hypertensive.. Pulse regular and within target range for patient.Marland Kitchen Respirations regular, non-labored and within target range.. Temperature is normal and within the target range for the patient.Marland Kitchen Appears in no distress. Eyes Conjunctivae clear. No discharge.no icterus. Respiratory work of breathing is normal. Cardiovascular It will pulses are palpable on the left. Edema present in both extremities.. Integumentary (Hair, Skin) Change of chronic venous insufficiency. Much less erythema around the wound. Psychiatric appears at normal baseline. Notes Wound exam; much better than 3 days ago. Surface does not look too bad. Debrided with Anasept and gauze mechanical debridement may be necessary. Electronic Signature(s) Signed: 09/18/2019 7:47:18 AM By: Linton Ham MD Entered By: Linton Ham on 09/17/2019  11:21:05 -------------------------------------------------------------------------------- Physician Orders Details Patient Name: Date of Service: Kathryn [name] 09/17/2019 10:45 AM Medical Record [id number] Patient Account Number: [id number] Date of Birth/Sex:  Treating RN: Jan 14, 1931 (85 y.o. [name] Primary Care Provider: Eliezer Lofts Other Clinician: Referring Provider: Treating Provider/Extender:Rodina Pinales, [name], [name] in Treatment: 2 Verbal / Phone Orders: No Diagnosis Coding ICD-10 Coding Code Description I87.303 Chronic venous hypertension (idiopathic) without complications of bilateral lower extremity S81.812D Laceration without foreign body, left lower leg, subsequent encounter L97.222 Non-pressure chronic ulcer of left calf with fat layer exposed L03.116 Cellulitis of left lower limb Follow-up Appointments Return Appointment in 1 week. - Monday Dressing Change Frequency Do not change entire dressing for one week. Skin Barriers/Peri-Wound Care Wound #1 Left,Medial Lower Leg Moisturizing lotion - to leg Wound Cleansing Wound #1 Left,Medial Lower Leg May shower with protection. 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 Kathryn [name] Primary Care Provider: Eliezer Lofts Other Clinician: Referring Provider: Treating Provider/Extender:Dashan Chizmar, [name], [name] in Treatment: 2 Active Problems ICD-10 Evaluated Encounter Code Description Active Date Today Diagnosis I87.303 Chronic venous hypertension (idiopathic) without 99991111 No Yes complications of bilateral lower extremity S81.812D Laceration without foreign body, left lower leg, 08/28/2019 No Yes subsequent encounter L97.222 Non-pressure chronic ulcer of left calf with fat layer 08/28/2019 No Yes exposed L03.116 Cellulitis of left lower limb 09/14/2019 No Yes Inactive Problems Resolved Problems Electronic Signature(s) Signed: 09/18/2019 7:47:18 AM By: Linton Ham MD Entered By: Linton Ham on 09/17/2019 11:18:31 -------------------------------------------------------------------------------- Progress Note Details Patient Name: Date of Service: Kathryn [name] 09/17/2019 10:45 AM Medical Record [id number] Patient Account Number: [id number] Date of Birth/Sex: Treating RN: 05-14-1931 (85 y.o. F) Primary Care Provider: Eliezer Lofts Other Clinician: Referring Provider: Treating Provider/Extender:Kendre Jacinto, [name], [name] in Treatment: 2 Subjective History of Present Illness (HPI) ADMISSION 08/28/19 This is an 85 year old woman who is still very active still working full-time at [employer] Sometime in late September her left calf was hit by the harness of her dog who was chasing another person. She was left with a fairly clear skin tear she tried to pull the skin back over the wound but it it did not maintain. She was seen in her primary doctor's office on 07/20/2019 she was given a topical antibiotic and  cephalexin. She was reviewed in primary care on 11/3 and it was felt the wound might need debridement and she was sent here. The patient has chronic venous insufficiency however she does not have a history of recurrent chronic wounds. Past medical history includes cellulitis of the left foot, hyperlipidemia, irregular heart rate, colonic polyps., Low back pain, B12 deficiency, Dupuytren's contractures, recurrent UTIs she has a history of bilateral hip surgery for osteoarthritis ABI in our clinic was 0.94 on the left 12/8; deep laceration wound on the left medial mid tibia. We are using Iodoflex under compression. She is not eligible for home health 12/15;  deep laceration wound on the left mid tibia. We are using Iodoflex under compression. Perhaps some minimal improvement in wound surface certainly no improvement in surface area 12/18; the patient called in to report drainage coming out of the wound and increasing pain. She was brought in for a nurse visit. Noted to have serosanguineous drainage and I was asked to look at this. 12/21; 3-day follow-up. Culture I did on 12/18 grew multiple organisms but none predominated. In spite of this the doxycycline really seems to have helped there is much less surrounding erythema. We allowed her to change the dressing over the weekend. The patient still works at [employer] There is edema around the wound because we did not wrap her last time. I wanted her to be able to continue to monitor the degree of erythema Objective Constitutional Patient is hypertensive.. Pulse regular and within target range for patient.Marland Kitchen Respirations regular, non-labored and within target range.. Temperature is normal and within the target range for the patient.Marland Kitchen Appears in no distress. Vitals Time Taken: 10:48 AM, Height: 67 in, Weight: 125 lbs, BMI: 19.6, Temperature: 98.6 F, Pulse: 77 bpm, Respiratory Rate: 20 breaths/min, Blood Pressure: 166/88 mmHg. Eyes Conjunctivae  clear. No discharge.no icterus. Respiratory work of breathing is normal. Cardiovascular It will pulses are palpable on the left. Edema present in both extremities.Marland Kitchen Psychiatric appears at normal baseline. General Notes: Wound exam; much better than 3 days ago. Surface does not look too bad. Debrided with Anasept and gauze mechanical debridement may be necessary. Integumentary (Hair, Skin) Change of chronic venous insufficiency. Much less erythema around the wound. Wound #1 status is Open. Original cause of wound was Trauma. The wound is located on the Left,Medial Lower Leg. The wound measures 7.6cm length x 3.6cm width x 0.2cm depth; 21.488cm^2 area and 4.298cm^3 volume. There is Fat Layer (Subcutaneous Tissue) Exposed exposed. There is no tunneling or undermining noted. There is a medium amount of serosanguineous drainage noted. The wound margin is flat and intact. There is small (1-33%) red granulation within the wound bed. There is a large (67-100%) amount of necrotic tissue within the wound bed including Adherent Slough. Assessment Active Problems ICD-10 Chronic venous hypertension (idiopathic) without complications of bilateral lower extremity Laceration without foreign body, left lower leg, subsequent encounter Non-pressure chronic ulcer of left calf with fat layer exposed Cellulitis of left lower limb Procedures Wound #1 Pre-procedure diagnosis of Wound #1 is a Venous Leg Ulcer located on the Left,Medial Lower Leg . There was a Three Layer Compression Therapy Procedure by Deon Pilling, RN. Post procedure Diagnosis Wound #1: Same as Pre-Procedure Plan Follow-up Appointments: Return Appointment in 1 week. - Monday Dressing Change Frequency: Do not change entire dressing for one week. Skin Barriers/Peri-Wound Care: Wound #1 Left,Medial Lower Leg: Moisturizing lotion - to leg Wound Cleansing: Wound #1 Left,Medial Lower Leg: May shower with protection. Primary Wound  Dressing: Wound #1 Left,Medial Lower Leg: Calcium Alginate with Silver Secondary Dressing: Wound #1 Left,Medial Lower Leg: Dry Gauze ABD pad Carboflex Edema Control: 3 Layer Compression System - Left Lower Extremity Avoid standing for long periods of time Elevate legs to the level of the heart or above for 30 minutes daily and/or when sitting, a frequency of: - throughout the day Exercise regularly Additional Orders / Instructions: Other: - out of work for 2 weeks 1. Continuing silver alginate but this time under compression to control the swelling around the wound. 2. We have put the patient out of work for 2 weeks. 3. I would  like to order Apligraf for this difficult wound. May need debridement before I can put this on however. Electronic Signature(s) Signed: 09/18/2019 7:47:18 AM By: Linton Ham MD Entered By: Linton Ham on 09/17/2019 11:22:03 -------------------------------------------------------------------------------- SuperBill Details Patient Name: Date of Service: Kathryn [name] 09/17/2019 Medical Record [id number] Patient Account Number: [id number] Date of Birth/Sex: Treating RN: October 29, 1930 (85 y.o. Kathryn [name] Primary Care Provider: Eliezer Lofts Other Clinician: Referring Provider: Treating Provider/Extender:Atha Muradyan, [name], [name] in Treatment: 2 Diagnosis Coding ICD-10 Codes Code Description I87.303 Chronic venous hypertension (idiopathic) without complications of bilateral lower extremity S81.812D Laceration without foreign body, left lower leg, subsequent encounter L97.222 Non-pressure chronic ulcer of left calf with fat layer exposed L03.116 Cellulitis of left lower limb Facility Procedures CPT4 Code Description: [id number] (Facility Use Only) [phone] - [place] [id number] LWR LT LEG Modifier: Quantity: 1 Physician Procedures CPT4 Code Description: [id number] [facility] - WC PHYS LEVEL 3 - EST PT ICD-10 Diagnosis Description  I87.303 Chronic venous hypertension (idiopathic) without complication extremity [id number] Non-pressure chronic ulcer of left calf with fat layer expose Modifier: s of bilateral l d Quantity: 1 ower Electronic Signature(s) Signed: 09/18/2019 7:47:18 AM By: Linton Ham MD Entered By: Linton Ham on 09/17/2019 11:22:24

## 2019-09-24 ENCOUNTER — Other Ambulatory Visit: Payer: Self-pay

## 2019-09-24 ENCOUNTER — Encounter (HOSPITAL_BASED_OUTPATIENT_CLINIC_OR_DEPARTMENT_OTHER): Payer: Medicare Other | Admitting: Internal Medicine

## 2019-09-24 DIAGNOSIS — I87303 Chronic venous hypertension (idiopathic) without complications of bilateral lower extremity: Secondary | ICD-10-CM | POA: Diagnosis not present

## 2019-09-24 NOTE — Progress Notes (Addendum)
[name], BACILIO ([id number]) Visit Report for 09/24/2019 Arrival Information Details Patient Name: Date of Service: Kathryn [name], Kathryn [name] 09/24/2019 10:45 AM Medical Record [id number] Patient Account Number: [id number] Date of Birth/Sex: Treating RN: 05-17-1931 (85 y.o. Orvan Falconer Primary Care Dheeraj Hail: Eliezer Lofts Other Clinician: Referring Wayland Baik: Treating Shatara Stanek/Extender:Robson, [name], [name] in Treatment: 3 Visit Information History Since Last Visit All ordered tests and consults were completed: No Patient Arrived: Ambulatory Added or deleted any medications: No Arrival Time: 12:21 Any new allergies or adverse reactions: No Accompanied By: sister Had a fall or experienced change in No Transfer Assistance: None activities of daily living that may affect Patient Identification Verified: Yes risk of falls: Secondary Verification Process Completed: Yes Signs or symptoms of abuse/neglect since last No Patient Requires Transmission-Based No visito Precautions: Hospitalized since last visit: No Patient Has Alerts: No Implantable device outside of the clinic excluding No cellular tissue based products placed in the center since last visit: Has Dressing in Place as Prescribed: Yes Has Compression in Place as Prescribed: Yes Pain Present Now: No Electronic Signature(s) Signed: 09/24/2019 5:45:39 PM By: Carlene Coria RN Entered By: Carlene Coria on 09/24/2019 12:22:08 -------------------------------------------------------------------------------- Compression Therapy Details Patient Name: Date of Service: Kathryn [name] 09/24/2019 10:45 AM Medical Record [id number] Patient Account Number: [id number] Date of Birth/Sex: Treating RN: 1931-08-24 (85 y.o. [name] Primary Care Nazar Kuan: Eliezer Lofts Other Clinician: Referring Asani Deniston: Treating Hershell Brandl/Extender:Robson, [name], [name] in Treatment: 3 Compression Therapy  Performed for Wound Wound #1 Left,Medial Lower Leg Assessment: Performed By: Clinician Levan Hurst, RN Compression Type: Three Layer Post Procedure Diagnosis Same as Pre-procedure Electronic Signature(s) Signed: 09/24/2019 6:10:06 PM By: Levan Hurst RN, BSN Entered By: Levan Hurst on 09/24/2019 12:55:42 -------------------------------------------------------------------------------- Encounter Discharge Information Details Patient Name: Date of Service: Kathryn [name] 09/24/2019 10:45 AM Medical Record [id number] Patient Account Number: [id number] Date of Birth/Sex: Treating RN: 21-Mar-1931 (85 y.o. [name] Primary Care Hollie Bartus: Eliezer Lofts Other Clinician: Referring Ivyana Locey: Treating Lisamarie Coke/Extender:Robson, [name], [name] in Treatment: 3 Encounter Discharge Information Items Post Procedure Vitals Discharge Condition: Stable Temperature (F): 98.2 Ambulatory Status: Ambulatory Pulse (bpm): 76 Discharge Destination: Home Respiratory Rate (breaths/min): 18 Transportation: Private Auto Blood Pressure (mmHg): 131/73 Accompanied By: friend Schedule Follow-up Appointment: Yes Clinical Summary of Care: Electronic Signature(s) Signed: 09/24/2019 5:44:52 PM By: Deon Pilling Entered By: Deon Pilling on 09/24/2019 13:11:12 -------------------------------------------------------------------------------- Lower Extremity Assessment Details Patient Name: Date of Service: Kathryn [name] 09/24/2019 10:45 AM Medical Record [id number] Patient Account Number: [id number] Date of Birth/Sex: Treating RN: 1930/10/04 (85 y.o. Orvan Falconer Primary Care Makaiah Terwilliger: Eliezer Lofts Other Clinician: Referring Marilyn Nihiser: Treating Khalifa Knecht/Extender:Robson, [name], [name] in Treatment: 3 Edema Assessment Assessed: [Left: No] [Right: No] Edema: [Left: Ye] [Right: s] Calf Left: Right: Point of Measurement: cm From Medial Instep 31 cm  cm Ankle Left: Right: Point of Measurement: cm From Medial Instep 21 cm cm Electronic Signature(s) Signed: 09/24/2019 5:45:39 PM By: Carlene Coria RN Entered By: Carlene Coria on 09/24/2019 12:22:58 -------------------------------------------------------------------------------- Multi Wound Chart Details Patient Name: Date of Service: Kathryn [name] 09/24/2019 10:45 AM Medical Record [id number] Patient Account Number: [id number] Date of Birth/Sex: Treating RN: 05-Mar-1931 (85 y.o. F) Primary Care Kathryn [name]: Eliezer Lofts Other Clinician: Referring Jarrel Knoke: Treating Shaneal Barasch/Extender:Robson, [name], [name] in Treatment: 3 Vital Signs Height(in): 67 Pulse(bpm): 76 Weight(lbs): 125 Blood Pressure(mmHg): 131/73 Body Mass Index(BMI): 20 Temperature(F): 98.2 Respiratory 18 Rate(breaths/min): Photos: [1:No Photos] [N/A:N/A] Wound Location: [1:Left Lower Leg - Medial] [N/A:N/A]  Wounding Event: [1:Trauma] [N/A:N/A] Primary Etiology: [1:Venous Leg Ulcer] [N/A:N/A] Comorbid History: [1:Cataracts, Osteoarthritis, N/A Confinement Anxiety] Date Acquired: [1:05/29/2019] [N/A:N/A] Weeks of Treatment: [1:3] [N/A:N/A] Wound Status: [1:Open] [N/A:N/A] Measurements L x W x D 6.5x2.7x0.2 [N/A:N/A] (cm) Area (cm) : [1:13.784] [N/A:N/A] Volume (cm) : [1:2.757] [N/A:N/A] % Reduction in Area: [1:-38.00%] [N/A:N/A] % Reduction in Volume: 8.00% [N/A:N/A] Classification: [1:Full Thickness Without Exposed Support Structures] [N/A:N/A] Exudate Amount: [1:Medium] [N/A:N/A] Exudate Type: [1:Serosanguineous] [N/A:N/A] Exudate Color: [1:red, brown] [N/A:N/A] Wound Margin: [1:Flat and Intact] [N/A:N/A] Granulation Amount: [1:Small (1-33%)] [N/A:N/A] Granulation Quality: [1:Red] [N/A:N/A] Necrotic Amount: [1:Large (67-100%)] [N/A:N/A] Exposed Structures: [1:Fat Layer (Subcutaneous Tissue) Exposed: Yes Fascia: No Tendon: No Muscle: No Joint: No Bone: No] [N/A:N/A] Epithelialization:  [1:Small (1-33%)] [N/A:N/A] Debridement: [1:Debridement - Excisional] [N/A:N/A] Pre-procedure [1:12:54] [N/A:N/A] Verification/Time Out Taken: Pain Control: [1:Other] [N/A:N/A] Tissue Debrided: [1:Subcutaneous, Slough] [N/A:N/A] Level: [1:Skin/Subcutaneous Tissue] [N/A:N/A] Debridement Area (sq cm):17.55 [N/A:N/A] Instrument: [1:Curette] [N/A:N/A] Bleeding: [1:Minimum] [N/A:N/A] Hemostasis Achieved: [1:Pressure] [N/A:N/A] Procedural Pain: [1:4] [N/A:N/A] Post Procedural Pain: [1:0] [N/A:N/A] Debridement Treatment Procedure was tolerated [N/A:N/A] Response: [1:well] Post Debridement [1:6.5x2.7x0.2] [N/A:N/A] Measurements L x W x D (cm) Post Debridement [1:2.757] [N/A:N/A] Volume: (cm) Procedures Performed: Compression Therapy [1:Debridement] [N/A:N/A] Treatment Notes Wound #1 (Left, Medial Lower Leg) 1. Cleanse With Wound Cleanser Soap and water 2. Periwound Care Moisturizing lotion 3. Primary Dressing Applied Iodoflex 4. Secondary Dressing ABD Pad Dry Gauze Other secondary dressing (specify in notes) 6. Support Layer Applied 3 layer compression wrap Notes carboflex as secondary. netting. Electronic Signature(s) Signed: 09/24/2019 6:10:19 PM By: Linton Ham MD Entered By: Linton Ham on 09/24/2019 17:59:03 -------------------------------------------------------------------------------- Multi-Disciplinary Care Plan Details Patient Name: Date of Service: CHERYLANNE, FAIRLESS 09/24/2019 10:45 AM Medical Record [id number] Patient Account Number: [id number] Date of Birth/Sex: Treating RN: 08-Sep-1931 (85 y.o. [name] Primary Care Keriann Rankin: Eliezer Lofts Other Clinician: Referring Latanga Nedrow: Treating Joshia Kitchings/Extender:Robson, [name], [name] in Treatment: 3 Active Inactive Wound/Skin Impairment Nursing Diagnoses: Knowledge deficit related to ulceration/compromised skin integrity Goals: Patient/caregiver will verbalize understanding of  skin care regimen Date Initiated: 08/28/2019 Target Resolution Date: 10/05/2019 Goal Status: Active Ulcer/skin breakdown will have a volume reduction of 30% by week 4 Date Initiated: 08/28/2019 Target Resolution Date: 10/05/2019 Goal Status: Active Interventions: Assess patient/caregiver ability to obtain necessary supplies Assess patient/caregiver ability to perform ulcer/skin care regimen upon admission and as needed Assess ulceration(s) every visit Notes: Electronic Signature(s) Signed: 09/24/2019 6:10:06 PM By: Levan Hurst RN, BSN Entered By: Levan Hurst on 09/24/2019 17:51:32 -------------------------------------------------------------------------------- Pain Assessment Details Patient Name: Date of Service: Kathryn [name] 09/24/2019 10:45 AM Medical Record [id number] Patient Account Number: [id number] Date of Birth/Sex: Treating RN: 1931-06-09 (85 y.o. Orvan Falconer Primary Care Mckinzie Saksa: Eliezer Lofts Other Clinician: Referring Dhanvin Szeto: Treating Jaxston Chohan/Extender:Robson, [name], [name] in Treatment: 3 Active Problems Location of Pain Severity and Description of Pain Patient Has Paino No Site Locations Pain Management and Medication Current Pain Management: Electronic Signature(s) Signed: 09/24/2019 5:45:39 PM By: Carlene Coria RN Entered By: Carlene Coria on 09/24/2019 12:22:48 -------------------------------------------------------------------------------- Patient/Caregiver Education Details Patient Name: Date of Service: Kathryn [name] 12/28/2020andnbsp10:45 AM Medical Record Patient Account Number: [id number] Number: Treating RN: Levan Hurst Date of Birth/Gender: 11/16/1930 (85 y.o. F) Other Clinician: Primary Care Physician: Eliezer Lofts Treating Linton Ham Referring Physician: Physician/Extender: Leslye Peer in Treatment: 3 Education Assessment Education Provided To: Patient Education Topics  Provided Wound/Skin Impairment: Methods: Explain/Verbal Responses: State content correctly Electronic Signature(s) Signed: 09/24/2019 6:10:06 PM By: Levan Hurst RN, BSN Entered By: Donnal Debar,  Shatara on 09/24/2019 17:51:42 -------------------------------------------------------------------------------- Wound Assessment Details Patient Name: Date of Service: CIANNI, POPKO 09/24/2019 10:45 AM Medical Record [id number] Patient Account Number: [id number] Date of Birth/Sex: Treating RN: 05-Aug-1931 (85 y.o. Orvan Falconer Primary Care Robi Dewolfe: Eliezer Lofts Other Clinician: Referring Lue Sykora: Treating Judit Awad/Extender:Robson, [name], [name] in Treatment: 3 Wound Status Wound Number: 1 Primary Venous Leg Ulcer Etiology: Wound Location: Left Lower Leg - Medial Wound Status: Open Wounding Event: Trauma Comorbid Cataracts, Osteoarthritis, Confinement Date Acquired: 05/29/2019 History: Anxiety Weeks Of Treatment: 3 Clustered Wound: No Photos Wound Measurements Length: (cm) 6.5 % Reduc Width: (cm) 2.7 % Reduc Depth: (cm) 0.2 Epithel Area: (cm) 13.784 Tunnel Volume: (cm) 2.757 Underm Wound Description Full Thickness Without Exposed Support Classification: Structures Wound Flat and Intact Margin: Exudate Medium Amount: Exudate Serosanguineous Type: Exudate red, brown Color: Wound Bed Granulation Amount: Small (1-33%) Granulation Quality: Red Necrotic Amount: Large (67-100%) Necrotic Quality: Adherent Slough Treatment Notes Wound #1 (Left, Medial Lower Leg) 1. Cleanse With Wound Cleanser Soap and water 2. Periwound Care Moisturizing lotion 3. Primary Dressing Applied Iodoflex 4. Secondary Dressing ABD Pad Dry Gauze Other secondary dressing (specify in notes) 6. Support Layer Applied 3 layer compression wrap Foul Odor After Cleansing: No Slough/Fibrino Yes Exposed Structure Fascia Exposed: No Fat Layer (Subcutaneous Tissue) Exposed:  Yes Tendon Exposed: No Muscle Exposed: No Joint Exposed: No Bone Exposed: No tion in Area: -38% tion in Volume: 8% ialization: Small (1-33%) ing: No ining: No Notes carboflex as secondary. netting. Electronic Signature(s) Signed: 09/25/2019 3:43:24 PM By: Mikeal Hawthorne EMT/HBOT Signed: 09/25/2019 5:43:27 PM By: Carlene Coria RN Previous Signature: 09/24/2019 5:45:39 PM Version By: Carlene Coria RN Entered By: Mikeal Hawthorne on 09/25/2019 14:48:36 -------------------------------------------------------------------------------- Vitals Details Patient Name: Date of Service: Kathryn [name] 09/24/2019 10:45 AM Medical Record [id number] Patient Account Number: [id number] Date of Birth/Sex: Treating RN: 1931/07/19 (85 y.o. Orvan Falconer Primary Care Gottfried Standish: Eliezer Lofts Other Clinician: Referring Feliz Lincoln: Treating Jamar Weatherall/Extender:Robson, [name], [name] in Treatment: 3 Vital Signs Time Taken: 12:22 Temperature (F): 98.2 Height (in): 67 Pulse (bpm): 76 Weight (lbs): 125 Respiratory Rate (breaths/min): 18 Body Mass Index (BMI): 19.6 Blood Pressure (mmHg): 131/73 Reference Range: 80 - 120 mg / dl Electronic Signature(s) Signed: 09/24/2019 5:45:39 PM By: Carlene Coria RN Entered By: Carlene Coria on 09/24/2019 12:22:40

## 2019-09-24 NOTE — Progress Notes (Signed)
[name], [name] ([id number]) Visit Report for 09/24/2019 Debridement Details Patient Name: Date of Service: Kathryn [name], Kathryn [name] 09/24/2019 10:45 AM Medical Record [id number] Patient Account Number: [id number] Date of Birth/Sex: 12-12-1930 (85 y.o. F) Treating RN: Primary Care Provider: Eliezer [name] Other Clinician: Referring Provider: Treating Provider/Extender:Kathryn [name], Kathryn [name], Kathryn [name] in Treatment: 3 Debridement Performed for Wound #1 Left,Medial Lower Leg Assessment: Performed By: Physician Kathryn [name]., MD Debridement Type: Debridement Severity of Tissue Pre Fat layer exposed Debridement: Level of Consciousness (Pre- Awake and Alert procedure): Pre-procedure Verification/Time Out Taken: Yes - 12:54 Start Time: 12:54 Pain Control: Other : Benzocaine 20% Total Area Debrided (L x W): 6.5 (cm) x 2.7 (cm) = 17.55 (cm) Tissue and other material Viable, Non-Viable, Slough, Subcutaneous, Slough debrided: Level: Skin/Subcutaneous Tissue Debridement Description: Excisional Instrument: Curette Bleeding: Minimum Hemostasis Achieved: Pressure End Time: 12:55 Procedural Pain: 4 Post Procedural Pain: 0 Response to Treatment: Procedure was tolerated well Level of Consciousness Awake and Alert (Post-procedure): Post Debridement Measurements of Total Wound Length: (cm) 6.5 Width: (cm) 2.7 Depth: (cm) 0.2 Volume: (cm) 2.757 Character of Wound/Ulcer Post Requires Further Debridement Debridement: Severity of Tissue Post Debridement: Fat layer exposed Post Procedure Diagnosis Same as Pre-procedure Electronic Signature(s) Signed: 09/24/2019 6:10:19 PM By: Kathryn Ham MD Entered By: Kathryn [name] on 09/24/2019 17:59:09 -------------------------------------------------------------------------------- HPI Details Patient Name: Date of Service: Kathryn [name] 09/24/2019 10:45 AM Medical Record [id number] Patient Account Number: [id number] Date of  Birth/Sex: Treating RN: 05-17-1931 (85 y.o. F) Primary Care Provider: Eliezer [name] Other Clinician: Referring Provider: Treating Provider/Extender:Kathryn [name], Kathryn [name], Kathryn [name] in Treatment: 3 History of Present Illness HPI Description: ADMISSION 08/28/19 This is an 85 year old woman who is still very active still working full-time at [employer] Sometime in late September her left calf was hit by the harness of her dog who was chasing another person. She was left with a fairly clear skin tear she tried to pull the skin back over the wound but it it did not maintain. She was seen in her primary doctor's office on 07/20/2019 she was given a topical antibiotic and cephalexin. She was reviewed in primary care on 11/3 and it was felt the wound might need debridement and she was sent here. The patient has chronic venous insufficiency however she does not have a history of recurrent chronic wounds. Past medical history includes cellulitis of the left foot, hyperlipidemia, irregular heart rate, colonic polyps., Low back pain, B12 deficiency, Dupuytren's contractures, recurrent UTIs she has a history of bilateral hip surgery for osteoarthritis ABI in our clinic was 0.94 on the left 12/8; deep laceration wound on the left medial mid tibia. We are using Iodoflex under compression. She is not eligible for home health 12/15; deep laceration wound on the left mid tibia. We are using Iodoflex under compression. Perhaps some minimal improvement in wound surface certainly no improvement in surface area 12/18; the patient called in to report drainage coming out of the wound and increasing pain. She was brought in for a nurse visit. Noted to have serosanguineous drainage and I was asked to look at this. 12/21; 3-day follow-up. Culture I did on 12/18 grew multiple organisms but none predominated. In spite of this the doxycycline really seems to have helped there is much less surrounding erythema. We  allowed her to change the dressing over the weekend. The patient still works at [employer] There is edema around the wound because we did not wrap her last time. I wanted her to be able to continue to  monitor the degree of erythema 12/28; the patient is not working and telling me she is staying off her foot is much as possible. She was approved for Apligraf but there was still too much debris over the wound surface today to consider this. Extensive debridement change dressing to Iodoflex Electronic Signature(s) Signed: 09/24/2019 6:10:19 PM By: Kathryn Ham MD Entered By: Kathryn [name] on 09/24/2019 17:59:43 -------------------------------------------------------------------------------- Physical Exam Details Patient Name: Date of Service: Kathryn [name] 09/24/2019 10:45 AM Medical Record [id number] Patient Account Number: [id number] Date of Birth/Sex: Treating RN: 1930-12-24 (85 y.o. F) Primary Care Provider: Eliezer [name] Other Clinician: Referring Provider: Treating Provider/Extender:Kathryn [name], Kathryn [name], Kathryn [name] in Treatment: 3 Constitutional Sitting or standing Blood Pressure is within target range for patient.. Pulse regular and within target range for patient.Marland Kitchen Respirations regular, non-labored and within target range.. Temperature is normal and within the target range for the patient.Marland Kitchen Appears in no distress. Notes Wound exam; much improved but still requiring an aggressive mechanical debridement. Using #5 curette removal of necrotic subcutaneous debris no evidence of infection around the wound. Stable changes of chronic venous insufficiency. Electronic Signature(s) Signed: 09/24/2019 6:10:19 PM By: Kathryn Ham MD Entered By: Kathryn [name] on 09/24/2019 18:00:36 -------------------------------------------------------------------------------- Physician Orders Details Patient Name: Date of Service: Kathryn [name] 09/24/2019 10:45 AM Medical Record  [id number] Patient Account Number: [id number] Date of Birth/Sex: Treating RN: 04/12/1931 (85 y.o. [name] Primary Care Provider: Eliezer [name] Other Clinician: Referring Provider: Treating Provider/Extender:Kathryn [name], Kathryn [name], Kathryn [name] in Treatment: 3 Verbal / Phone Orders: No Diagnosis Coding ICD-10 Coding Code Description I87.303 Chronic venous hypertension (idiopathic) without complications of bilateral lower extremity S81.812D Laceration without foreign body, left lower leg, subsequent encounter L97.222 Non-pressure chronic ulcer of left calf with fat layer exposed L03.116 Cellulitis of left lower limb Follow-up Appointments Return Appointment in 1 week. Dressing Change Frequency Wound #1 Left,Medial Lower Leg Do not change entire dressing for one week. Skin Barriers/Peri-Wound Care Wound #1 Left,Medial Lower Leg Moisturizing lotion - to leg Wound Cleansing Wound #1 Left,Medial Lower Leg May shower with protection. 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 [name] Primary Care Provider: Eliezer [name] Other Clinician: Referring Provider: Treating Provider/Extender:Rishith Siddoway, Kathryn [name], Kathryn [name] in Treatment: 3 Active Problems ICD-10 Evaluated Encounter Code Description Active Date Today Diagnosis I87.303 Chronic venous hypertension (idiopathic) without 99991111 No Yes complications of bilateral lower extremity S81.812D Laceration without foreign body, left lower leg, 08/28/2019 No Yes subsequent encounter L97.222 Non-pressure chronic ulcer of left calf with fat layer 08/28/2019 No Yes exposed L03.116 Cellulitis of left lower limb 09/14/2019 No Yes Inactive Problems Resolved Problems Electronic Signature(s) Signed: 09/24/2019 6:10:19 PM By: Kathryn Ham MD Entered By: Kathryn [name] on 09/24/2019 17:58:57 -------------------------------------------------------------------------------- Progress Note Details Patient Name: Date of  Service: Kathryn [name], Kathryn [name] 09/24/2019 10:45 AM Medical Record [id number] Patient Account Number: [id number] Date of Birth/Sex: Treating RN: 06-28-1931 (85 y.o. F) Primary Care Provider: Eliezer [name] Other Clinician: Referring Provider: Treating Provider/Extender:Gizella [place], Kathryn [name], Kathryn [name] in Treatment: 3 Subjective History of Present Illness (HPI) ADMISSION 08/28/19 This is an 85 year old woman who is still very active still working full-time at [employer] Sometime in late September her left calf was hit by the harness of her dog who was chasing another person. She was left with a fairly clear skin tear she tried to pull the skin back over the wound but it it did not maintain. She was seen in her primary doctor's office on 07/20/2019 she was given a topical antibiotic and cephalexin. She was reviewed in primary care on 11/3 and it was felt the wound might need debridement and she was sent here. The patient has chronic venous insufficiency however she does not have a history of recurrent  chronic wounds. Past medical history includes cellulitis of the left foot, hyperlipidemia, irregular heart rate, colonic polyps., Low back pain, B12 deficiency, Dupuytren's contractures, recurrent UTIs she has a history of bilateral hip surgery for osteoarthritis ABI in our clinic was 0.94 on the left 12/8; deep laceration wound on the left medial mid tibia. We are using Iodoflex under compression. She is not eligible for home health 12/15; deep laceration wound on the left mid tibia. We are using Iodoflex under compression. Perhaps some minimal improvement in wound surface certainly no improvement in surface area 12/18; the patient called in to report drainage coming out of the wound and increasing pain. She was brought in for a nurse visit. Noted to have serosanguineous drainage and I was asked to look at this. 12/21; 3-day follow-up. Culture I did on 12/18 grew multiple organisms but none predominated. In spite of this the doxycycline really seems to have helped there is much less surrounding erythema. We allowed her to change the dressing over the weekend. The patient still works at [employer] There is edema around the wound because we did not wrap her last time. I wanted her to be able to continue to monitor the degree of erythema 12/28; the patient is not working and telling me she is staying off her foot is much as possible. She was approved for Apligraf but there was still too much debris over the wound surface today to consider this. Extensive debridement change dressing to Iodoflex Objective Constitutional Sitting or standing Blood Pressure is within target range for patient.. Pulse regular and within target range for patient.Marland Kitchen Respirations regular, non-labored and within target range.. Temperature is normal and within the target range for the patient.Marland Kitchen Appears in no distress. Vitals Time Taken: 12:22 PM, Height: 67 in, Weight: 125 lbs, BMI: 19.6, Temperature: 98.2 F, Pulse: 76  bpm, Respiratory Rate: 18 breaths/min, Blood Pressure: 131/73 mmHg. General Notes: Wound exam; much improved but still requiring an aggressive mechanical debridement. Using #5 curette removal of necrotic subcutaneous debris no evidence of infection around the wound. Stable changes of chronic venous insufficiency. Integumentary (Hair, Skin) Wound #1 status is Open. Original cause of wound was Trauma. The wound is located on the Left,Medial Lower Leg. The wound measures 6.5cm length x 2.7cm width x 0.2cm depth; 13.784cm^2 area and 2.757cm^3 volume. There is Fat Layer (Subcutaneous Tissue) Exposed exposed. There is no tunneling or undermining noted. There is a medium amount of serosanguineous drainage noted. The wound margin is flat and intact. There is small (1-33%) red granulation within  the wound bed. There is a large (67-100%) amount of necrotic tissue within the wound bed including Adherent Slough. Assessment Active Problems ICD-10 Chronic venous hypertension (idiopathic) without complications of bilateral lower extremity Laceration without foreign body, left lower leg, subsequent encounter Non-pressure chronic ulcer of left calf with fat layer exposed Cellulitis of left lower limb Procedures Wound #1 Pre-procedure diagnosis of Wound #1 is a Venous Leg Ulcer located on the Left,Medial Lower Leg .Severity of Tissue Pre Debridement is: Fat layer exposed. There was a Excisional Skin/Subcutaneous Tissue Debridement with a total area of 17.55 sq cm performed by Kathryn [name]., MD. With the following instrument(s): Curette to remove Viable and Non-Viable tissue/material. Material removed includes Subcutaneous Tissue and Slough and after achieving pain control using Other (Benzocaine 20%). No specimens were taken. A time out was conducted at 12:54, prior to the start of the procedure. A Minimum amount of bleeding was controlled with Pressure. The procedure was tolerated well with a pain  level of 4 throughout and a pain level of 0 following the procedure. Post Debridement Measurements: 6.5cm length x 2.7cm width x 0.2cm depth; 2.757cm^3 volume. Character of Wound/Ulcer Post Debridement requires further debridement. Severity of Tissue Post Debridement is: Fat layer exposed. Post procedure Diagnosis Wound #1: Same as Pre-Procedure Pre-procedure diagnosis of Wound #1 is a Venous Leg Ulcer located on the Left,Medial Lower Leg . There was a Three Layer Compression Therapy Procedure by Levan Hurst, RN. Post procedure Diagnosis Wound #1: Same as Pre-Procedure Plan Follow-up Appointments: Return Appointment in 1 week. Dressing Change Frequency: Wound #1 Left,Medial Lower Leg: Do not change entire dressing for one week. Skin Barriers/Peri-Wound Care: Wound #1 Left,Medial Lower Leg: Moisturizing lotion - to leg Wound Cleansing: Wound #1 Left,Medial Lower Leg: May shower with protection. Primary Wound Dressing: Wound #1 Left,Medial Lower Leg: Iodoflex Secondary Dressing: Wound #1 Left,Medial Lower Leg: Dry Gauze ABD pad Carboflex Edema Control: 3 Layer Compression System - Left Lower Extremity Avoid standing for long periods of time Elevate legs to the level of the heart or above for 30 minutes daily and/or when sitting, a frequency of: - throughout the day Exercise regularly Additional Orders / Instructions: Other: - out of work for 2 [name] 1. I change the primary dressing to Iodoflex. Goal is to get the wound surface a little better before transitioning to Apligraf. 2. Continue 3 layer compression with ABDs over the wound 3. I counseled the patient that if she does not need to work to take some time off. Spending 8 hours a day on her leg will no doubt impaired wound healing and increase edema Electronic Signature(s) Signed: 09/24/2019 6:10:19 PM By: Kathryn Ham MD Entered By: Kathryn [name] on 09/24/2019  18:01:40 -------------------------------------------------------------------------------- SuperBill Details Patient Name: Date of Service: Kathryn [name] 09/24/2019 Medical Record [id number] Patient Account Number: [id number] Date of Birth/Sex: Treating RN: May 10, 1931 (85 y.o. F) Primary Care Provider: Eliezer [name] Other Clinician: Referring Provider: Treating Provider/Extender:Antoninette Lerner, Kathryn [name], Kathryn [name] in Treatment: 3 Diagnosis Coding ICD-10 Codes Code Description I87.303 Chronic venous hypertension (idiopathic) without complications of bilateral lower extremity S81.812D Laceration without foreign body, left lower leg, subsequent encounter L97.222 Non-pressure chronic ulcer of left calf with fat layer exposed L03.116 Cellulitis of left lower limb Facility Procedures CPT4 Code: [id number] Description: [id number] - DEB SUBQ TISSUE 20 SQ CM/< ICD-10 Diagnosis Description L97.222 Non-pressure chronic ulcer of left calf with fat layer e Modifier: xposed Quantity: 1 Physician Procedures CPT4 Code: [id number] Description: [id number] - WC PHYS SUBQ TISS  20 SQ CM ICD-10 Diagnosis Description L97.222 Non-pressure chronic ulcer of left calf with fat layer e Modifier: xposed Quantity: 1 Electronic Signature(s) Signed: 09/24/2019 6:10:19 PM By: Kathryn Ham MD Entered By: Kathryn [name] on 09/24/2019 18:01:59

## 2019-10-01 ENCOUNTER — Other Ambulatory Visit: Payer: Self-pay

## 2019-10-01 ENCOUNTER — Encounter (HOSPITAL_BASED_OUTPATIENT_CLINIC_OR_DEPARTMENT_OTHER): Payer: Medicare Other | Admitting: Internal Medicine

## 2019-10-01 DIAGNOSIS — L03116 Cellulitis of left lower limb: Secondary | ICD-10-CM | POA: Insufficient documentation

## 2019-10-01 DIAGNOSIS — I872 Venous insufficiency (chronic) (peripheral): Secondary | ICD-10-CM | POA: Insufficient documentation

## 2019-10-01 DIAGNOSIS — L97222 Non-pressure chronic ulcer of left calf with fat layer exposed: Secondary | ICD-10-CM | POA: Diagnosis not present

## 2019-10-01 NOTE — Progress Notes (Addendum)
Kathryn [name], VEITCH ([id number]) Visit Report for 10/01/2019 Arrival Information Details Patient Name: Date of Service: Kathryn [name], TUZZOLINO 10/01/2019 11:00 AM Medical Record [id number] Patient Account Number: [id number] Date of Birth/Sex: Treating RN: Mar 30, 1931 (86 y.o. [name] Primary Care Audrea Bolte: Eliezer Lofts Other Clinician: Referring Siddhartha Hoback: Treating Faustina Gebert/Extender:Robson, [name], [name] in Treatment: 4 Visit Information History Since Last Visit Added or deleted any medications: No Patient Arrived: Ambulatory Any new allergies or adverse reactions: No Arrival Time: 11:28 Had a fall or experienced change in No Accompanied By: friend activities of daily living that may affect Transfer Assistance: None risk of falls: Patient Identification Verified: Yes Signs or symptoms of abuse/neglect since last No Secondary Verification Process Completed: Yes visito Patient Requires Transmission-Based No Hospitalized since last visit: No Precautions: Implantable device outside of the clinic excluding No Patient Has Alerts: No cellular tissue based products placed in the center since last visit: Has Dressing in Place as Prescribed: Yes Has Compression in Place as Prescribed: Yes Pain Present Now: No Electronic Signature(s) Signed: 10/01/2019 4:32:10 PM By: Kela Millin Entered By: Kela Millin on 10/01/2019 11:28:50 -------------------------------------------------------------------------------- Compression Therapy Details Patient Name: Date of Service: Kathryn [name] 10/01/2019 11:00 AM Medical Record [id number] Patient Account Number: [id number] Date of Birth/Sex: Treating RN: 10/08/1930 (86 y.o. [name] Primary Care Soila Printup: Eliezer Lofts Other Clinician: Referring Dmoni Fortson: Treating Bazil Dhanani/Extender:Robson, [name], [name] in Treatment: 4 Compression Therapy Performed for Wound Wound #1 Left,Medial Lower  Leg Assessment: Performed By: Clinician Levan Hurst, RN Compression Type: Three Layer Post Procedure Diagnosis Same as Pre-procedure Electronic Signature(s) Signed: 10/01/2019 5:01:35 PM By: Levan Hurst RN, BSN Entered By: Levan Hurst on 10/01/2019 11:56:10 -------------------------------------------------------------------------------- Encounter Discharge Information Details Patient Name: Date of Service: Kathryn [name] 10/01/2019 11:00 AM Medical Record [id number] Patient Account Number: [id number] Date of Birth/Sex: Treating RN: 12-12-1930 (86 y.o. [name] Primary Care Jaelene Garciagarcia: Eliezer Lofts Other Clinician: Referring Samir Ishaq: Treating Mackinze Criado/Extender:Robson, [name], [name] in Treatment: 4 Encounter Discharge Information Items Discharge Condition: Stable Ambulatory Status: Ambulatory Discharge Destination: Home Transportation: Private Auto Accompanied By: friend Schedule Follow-up Appointment: Yes Clinical Summary of Care: Patient Declined Electronic Signature(s) Signed: 10/01/2019 4:32:10 PM By: Kela Millin Entered By: Kela Millin on 10/01/2019 12:01:44 -------------------------------------------------------------------------------- Lower Extremity Assessment Details Patient Name: Date of Service: Kathryn [name], OSTROSKY 10/01/2019 11:00 AM Medical Record [id number] Patient Account Number: [id number] Date of Birth/Sex: Treating RN: 06/10/1931 (86 y.o. [name] Primary Care Amariya Liskey: Eliezer Lofts Other Clinician: Referring Landan Fedie: Treating Chief Walkup/Extender:Robson, [name], [name] in Treatment: 4 Edema Assessment Assessed: [Left: No] [Right: No] Edema: [Left: N] [Right: o] Calf Left: Right: Point of Measurement: cm From Medial Instep 31 cm cm Ankle Left: Right: Point of Measurement: cm From Medial Instep 20.5 cm cm Vascular Assessment Pulses: Dorsalis Pedis Palpable: [Left:Yes] Electronic  Signature(s) Signed: 10/01/2019 4:32:10 PM By: Kela Millin Entered By: Kela Millin on 10/01/2019 11:29:45 -------------------------------------------------------------------------------- Multi Wound Chart Details Patient Name: Date of Service: Kathryn [name] 10/01/2019 11:00 AM Medical Record [id number] Patient Account Number: [id number] Date of Birth/Sex: Treating RN: 04/30/1931 (86 y.o. F) Primary Care Rajat Staver: Eliezer Lofts Other Clinician: Referring Karel Turpen: Treating Karene Bracken/Extender:Robson, [name], [name] in Treatment: 4 Vital Signs Height(in): 67 Pulse(bpm): 79 Weight(lbs): 125 Blood Pressure(mmHg): 112/69 Body Mass Index(BMI): 20 Temperature(F): 98.1 Respiratory 18 Rate(breaths/min): Photos: [1:No Photos] [N/A:N/A] Wound Location: [1:Left Lower Leg - Medial] [N/A:N/A] Wounding Event: [1:Trauma] [N/A:N/A] Primary Etiology: [1:Venous Leg Ulcer] [N/A:N/A] Comorbid History: [1:Cataracts, Osteoarthritis, Confinement Anxiety] [N/A:N/A] Date  Acquired: [1:05/29/2019] [N/A:N/A] Weeks of Treatment: [1:4] [N/A:N/A] Wound Status: [1:Open] [N/A:N/A] Measurements L x W x D 7.4x3.2x0.2 [N/A:N/A] (cm) Area (cm) : [1:18.598] [N/A:N/A] Volume (cm) : [1:3.72] [N/A:N/A] % Reduction in Area: [1:-86.20%] [N/A:N/A] % Reduction in Volume: -24.10% [N/A:N/A] Classification: [1:Full Thickness Without Exposed Support Structures] [N/A:N/A] Exudate Amount: [1:Medium] [N/A:N/A] Exudate Type: [1:Serosanguineous] [N/A:N/A] Exudate Color: [1:red, brown] [N/A:N/A] Wound Margin: [1:Well defined, not attached N/A] Granulation Amount: [1:Medium (34-66%)] [N/A:N/A] Granulation Quality: [1:Red] [N/A:N/A] Necrotic Amount: [1:Medium (34-66%)] [N/A:N/A] Exposed Structures: [1:Fat Layer (Subcutaneous N/A Tissue) Exposed: Yes Fascia: No Tendon: No Muscle: No Joint: No Bone: No] Epithelialization: [1:Small (1-33%) Compression Therapy] [N/A:N/A N/A] Treatment Notes Wound #1  (Left, Medial Lower Leg) 1. Cleanse With Wound Cleanser Soap and water 2. Periwound Care Moisturizing lotion 3. Primary Dressing Applied Iodoflex 4. Secondary Dressing ABD Pad Dry Gauze Kerramax/Xtrasorb Other secondary dressing (specify in notes) 6. Support Layer Applied 3 layer compression wrap Notes carboflex as secondary. netting. Electronic Signature(s) Signed: 10/01/2019 4:56:38 PM By: Linton Ham MD Entered By: Linton Ham on 10/01/2019 12:53:25 -------------------------------------------------------------------------------- Multi-Disciplinary Care Plan Details Patient Name: Date of Service: Kathryn [name] 10/01/2019 11:00 AM Medical Record [id number] Patient Account Number: [id number] Date of Birth/Sex: Treating RN: Oct 01, 1930 (86 y.o. [name] Primary Care Dom Haverland: Eliezer Lofts Other Clinician: Referring Neita Landrigan: Treating Aava Deland/Extender:Robson, [name], [name] in Treatment: 4 Active Inactive Wound/Skin Impairment Nursing Diagnoses: Knowledge deficit related to ulceration/compromised skin integrity Goals: Patient/caregiver will verbalize understanding of skin care regimen Date Initiated: 08/28/2019 Target Resolution Date: 11/02/2019 Goal Status: Active Ulcer/skin breakdown will have a volume reduction of 30% by week 4 Date Initiated: 08/28/2019 Date Inactivated: 10/01/2019 Target Resolution Date: 10/05/2019 Goal Status: Met Ulcer/skin breakdown will have a volume reduction of 50% by week 8 Date Initiated: 10/01/2019 Target Resolution Date: 11/02/2019 Goal Status: Active Interventions: Assess patient/caregiver ability to obtain necessary supplies Assess patient/caregiver ability to perform ulcer/skin care regimen upon admission and as needed Assess ulceration(s) every visit Notes: Electronic Signature(s) Signed: 10/01/2019 5:01:35 PM By: Levan Hurst RN, BSN Entered By: Levan Hurst on 10/01/2019  11:50:01 -------------------------------------------------------------------------------- Pain Assessment Details Patient Name: Date of Service: Kathryn [name] 10/01/2019 11:00 AM Medical Record [id number] Patient Account Number: [id number] Date of Birth/Sex: Treating RN: 1930/11/09 (86 y.o. [name] Primary Care Qasim Diveley: Eliezer Lofts Other Clinician: Referring Ania Levay: Treating Kaiyu Mirabal/Extender:Robson, [name], [name] in Treatment: 4 Active Problems Location of Pain Severity and Description of Pain Patient Has Paino No Site Locations Pain Management and Medication Current Pain Management: Electronic Signature(s) Signed: 10/01/2019 4:32:10 PM By: Kela Millin Entered By: Kela Millin on 10/01/2019 11:29:19 -------------------------------------------------------------------------------- Patient/Caregiver Education Details Patient Name: Date of Service: Darty, Ohana B. 1/4/2021andnbsp11:00 AM Medical Record [id number] Patient Account Number: [id number] Date of Birth/Gender: 1931/01/30 (86 y.o. F) Treating RN: Levan Hurst Primary Care Physician: Eliezer Lofts Other Clinician: Referring Physician: Treating Physician/Extender:Robson, [name], [name] in Treatment: 4 Education Assessment Education Provided To: Patient Education Topics Provided Wound/Skin Impairment: Methods: Explain/Verbal Responses: State content correctly Electronic Signature(s) Signed: 10/01/2019 5:01:35 PM By: Levan Hurst RN, BSN Entered By: Levan Hurst on 10/01/2019 11:50:13 -------------------------------------------------------------------------------- Wound Assessment Details Patient Name: Date of Service: Kathryn [name] 10/01/2019 11:00 AM Medical Record [id number] Patient Account Number: [id number] Date of Birth/Sex: Treating RN: 1931/05/19 (86 y.o. [name] Primary Care Kseniya Grunden: Eliezer Lofts Other  Clinician: Referring Jacklynn Dehaas: Treating Kersti Scavone/Extender:Robson, [name], [name] in Treatment: 4 Wound Status Wound Number: 1 Primary Venous Leg Ulcer Etiology: Wound Location: Left Lower Leg -  Medial Wound Status: Open Wounding Event: Trauma Comorbid Cataracts, Osteoarthritis, Confinement Date Acquired: 05/29/2019 History: Anxiety Weeks Of Treatment: 4 Clustered Wound: No Photos Wound Measurements Length: (cm) 7.4 % Reduct Width: (cm) 3.2 % Reduct Depth: (cm) 0.2 Epitheli Area: (cm) 18.598 Tunneli Volume: (cm) 3.72 Undermi Wound Description Classification: Full Thickness Without Exposed Support Foul Od Structures Slough/ Wound Well defined, not attached Margin: Exudate Medium Amount: Exudate Serosanguineous Type: Exudate red, brown Color: Wound Bed Granulation Amount: Medium (34-66%) Granulation Quality: Red Fascia E Necrotic Amount: Medium (34-66%) Fat Laye Necrotic Quality: Adherent Slough Tendon E Muscle E Joint Ex Bone Exp Treatment Notes Wound #1 (Left, Medial Lower Leg) 1. Cleanse With Wound Cleanser Soap and water 2. Periwound Care Moisturizing lotion 3. Primary Dressing Applied Iodoflex 4. Secondary Dressing ABD Pad Dry Gauze Kerramax/Xtrasorb Other secondary dressing (specify in notes) 6. Support Layer Applied 3 layer compression wrap or After Cleansing: No Fibrino Yes Exposed Structure xposed: No r (Subcutaneous Tissue) Exposed: Yes xposed: No xposed: No posed: No osed: No ion in Area: -86.2% ion in Volume: -24.1% alization: Small (1-33%) ng: No ning: No Notes carboflex as secondary. netting. Electronic Signature(s) Signed: 10/02/2019 3:29:37 PM By: Mikeal Hawthorne EMT/HBOT Signed: 10/02/2019 5:45:44 PM By: Kela Millin Previous Signature: 10/01/2019 4:32:10 PM Version By: Kela Millin Entered By: Mikeal Hawthorne on 10/02/2019  14:39:17 -------------------------------------------------------------------------------- Vitals Details Patient Name: Date of Service: Kathryn Laine B. 10/01/2019 11:00 AM Medical Record [id number] Patient Account Number: [id number] Date of Birth/Sex: Treating RN: 06/11/1931 (86 y.o. [name] Primary Care Ka Flammer: Eliezer Lofts Other Clinician: Referring Natnael Biederman: Treating Taylynn Easton/Extender:Robson, [name], [name] in Treatment: 4 Vital Signs Time Taken: 11:28 Temperature (F): 98.1 Height (in): 67 Pulse (bpm): 79 Weight (lbs): 125 Respiratory Rate (breaths/min): 18 Body Mass Index (BMI): 19.6 Blood Pressure (mmHg): 112/69 Reference Range: 80 - 120 mg / dl Electronic Signature(s) Signed: 10/01/2019 4:32:10 PM By: Kela Millin Entered By: Kela Millin on 10/01/2019 11:29:11

## 2019-10-01 NOTE — Progress Notes (Signed)
Kathryn [name], Kathryn [name] ([id number]) Visit Report for 10/01/2019 HPI Details Patient Name: Date of Service: Kathryn [name], Kathryn [name] 10/01/2019 11:00 AM Medical Record [id number] Patient Account Number: [id number] Date of Birth/Sex: Treating RN: 17-Feb-1931 (86 y.o. F) Primary Care Provider: Eliezer Lofts Other Clinician: Referring Provider: Treating Provider/Extender:Elyon Zoll, [name], [name] in Treatment: 4 History of Present Illness HPI Description: ADMISSION 08/28/19 This is an 86 year old woman who is still very active still working full-time at [employer] Sometime in late September her left calf was hit by the harness of her dog who was chasing another person. She was left with a fairly clear skin tear she tried to pull the skin back over the wound but it it did not maintain. She was seen in her primary doctor's office on 07/20/2019 she was given a topical antibiotic and cephalexin. She was reviewed in primary care on 11/3 and it was felt the wound might need debridement and she was sent here. The patient has chronic venous insufficiency however she does not have a history of recurrent chronic wounds. Past medical history includes cellulitis of the left foot, hyperlipidemia, irregular heart rate, colonic polyps., Low back pain, B12 deficiency, Dupuytren's contractures, recurrent UTIs she has a history of bilateral hip surgery for osteoarthritis ABI in our clinic was 0.94 on the left 12/8; deep laceration wound on the left medial mid tibia. We are using Iodoflex under compression. She is not eligible for home health 12/15; deep laceration wound on the left mid tibia. We are using Iodoflex under compression. Perhaps some minimal improvement in wound surface certainly no improvement in surface area 12/18; the patient called in to report drainage coming out of the wound and increasing pain. She was brought in for a nurse visit. Noted to have serosanguineous drainage and I was asked to look at  this. 12/21; 3-day follow-up. Culture I did on 12/18 grew multiple organisms but none predominated. In spite of this the doxycycline really seems to have helped there is much less surrounding erythema. We allowed her to change the dressing over the weekend. The patient still works at [employer] There is edema around the wound because we did not wrap her last time. I wanted her to be able to continue to monitor the degree of erythema 12/28; the patient is not working and telling me she is staying off her foot is much as possible. She was approved for Apligraf but there was still too much debris over the wound surface today to consider this. Extensive debridement change dressing to Iodoflex 10/01/1999. The patient's wound is in a much better surface this week. We will order the Apligraf but continue Iodoflex for this week Electronic Signature(s) Signed: 10/01/2019 4:56:38 PM By: Linton Ham MD Entered By: Linton Ham on 10/01/2019 12:54:06 -------------------------------------------------------------------------------- Physical Exam Details Patient Name: Date of Service: Kathryn [name] 10/01/2019 11:00 AM Medical Record [id number] Patient Account Number: [id number] Date of Birth/Sex: Treating RN: 06-18-1931 (86 y.o. F) Primary Care Provider: Eliezer Lofts Other Clinician: Referring Provider: Treating Provider/Extender:Khaila Velarde, [name], [name] in Treatment: 4 Constitutional Sitting or standing Blood Pressure is within target range for patient.. Pulse regular and within target range for patient.Marland Kitchen Respirations regular, non-labored and within target range.. Temperature is normal and within the target range for the patient.Marland Kitchen Appears in no distress. Respiratory work of breathing is normal. Cardiovascular Pedal pulses are palpable. Integumentary (Hair, Skin) Changes of chronic venous insufficiency is in the right leg. Notes Wound exam; much better wound surface. No  mechanical debridement is required  the wound was washed off with Anasept and gauze. There is no surrounding infection edema is under good control Electronic Signature(s) Signed: 10/01/2019 4:56:38 PM By: Linton Ham MD Entered By: Linton Ham on 10/01/2019 12:55:07 -------------------------------------------------------------------------------- Physician Orders Details Patient Name: Date of Service: Kathryn [name] 10/01/2019 11:00 AM Medical Record [id number] Patient Account Number: [id number] Date of Birth/Sex: Treating RN: 1931/05/25 (86 y.o. [name] Primary Care Provider: Eliezer Lofts Other Clinician: Referring Provider: Treating Provider/Extender:Emrys Mceachron, [name], [name] in Treatment: 4 Verbal / Phone Orders: No Diagnosis Coding ICD-10 Coding Code Description I87.303 Chronic venous hypertension (idiopathic) without complications of bilateral lower extremity S81.812D Laceration without foreign body, left lower leg, subsequent encounter L97.222 Non-pressure chronic ulcer of left calf with fat layer exposed L03.116 Cellulitis of left lower limb Follow-up Appointments Return Appointment in 84 week. Dressing Change Frequency Wound #1 Left,Medial Lower Leg Do not change entire dressing for one week. Skin Barriers/Peri-Wound Care Wound #1 Left,Medial Lower Leg Moisturizing lotion - to leg Wound Cleansing Wound #1 Left,Medial Lower Leg May shower with protection. 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 [name] Primary Care Provider: Eliezer Lofts Other Clinician: Referring Provider: Treating Provider/Extender:Shealynn Saulnier, [name], [name] in Treatment: 4 Active Problems ICD-10 Evaluated Encounter Code Description Active Date Today Diagnosis I87.303 Chronic venous hypertension (idiopathic) without 99991111 No Yes complications of bilateral lower extremity S81.812D Laceration without foreign body, left lower leg, 08/28/2019 No Yes subsequent encounter L97.222 Non-pressure chronic ulcer of left calf with fat layer 08/28/2019 No Yes exposed L03.116 Cellulitis of left lower limb 09/14/2019 No Yes Inactive Problems Resolved Problems Electronic Signature(s) Signed: 10/01/2019 4:56:38 PM By: Linton Ham MD Entered By: Linton Ham on 10/01/2019 12:53:02 -------------------------------------------------------------------------------- Progress Note Details Patient Name: Date of Service: Kathryn [name] 10/01/2019 11:00 AM Medical Record [id number] Patient Account Number: [id number] Date of Birth/Sex: Treating RN: 03-23-1931 (86 y.o. F) Primary Care Provider: Eliezer Lofts Other Clinician: Referring Provider: Treating Provider/Extender:Racer Quam, [name], [name] in Treatment: 4 Subjective History of Present Illness (HPI) ADMISSION 08/28/19 This is an 86 year old woman who is still very active still working full-time at [employer] Sometime in late September her left calf was hit by the harness of her  dog who was chasing another person. She was left with a fairly clear skin tear she tried to pull the skin back over the wound but it it did not maintain. She was seen in her primary doctor's office on 07/20/2019 she was given a topical antibiotic and cephalexin. She was reviewed in primary care on 11/3 and it was felt the wound might need debridement and she was sent here. The patient has chronic venous insufficiency however she does not have a history of recurrent chronic wounds. Past medical history includes cellulitis of  the left foot, hyperlipidemia, irregular heart rate, colonic polyps., Low back pain, B12 deficiency, Dupuytren's contractures, recurrent UTIs she has a history of bilateral hip surgery for osteoarthritis ABI in our clinic was 0.94 on the left 12/8; deep laceration wound on the left medial mid tibia. We are using Iodoflex under compression. She is not eligible for home health 12/15; deep laceration wound on the left mid tibia. We are using Iodoflex under compression. Perhaps some minimal improvement in wound surface certainly no improvement in surface area 12/18; the patient called in to report drainage coming out of the wound and increasing pain. She was brought in for a nurse visit. Noted to have serosanguineous drainage and I was asked to look at this. 12/21; 3-day follow-up. Culture I did on 12/18 grew multiple organisms but none predominated. In spite of this the doxycycline really seems to have helped there is much less surrounding erythema. We allowed her to change the dressing over the weekend. The patient still works at [employer] There is edema around the wound because we did not wrap her last time. I wanted her to be able to continue to monitor the degree of erythema 12/28; the patient is not working and telling me she is staying off her foot is much as possible. She was approved for Apligraf but there was still too much debris over the wound surface today to  consider this. Extensive debridement change dressing to Iodoflex 10/01/1999. The patient's wound is in a much better surface this week. We will order the Apligraf but continue Iodoflex for this week Objective Constitutional Sitting or standing Blood Pressure is within target range for patient.. Pulse regular and within target range for patient.Marland Kitchen Respirations regular, non-labored and within target range.. Temperature is normal and within the target range for the patient.Marland Kitchen Appears in no distress. Vitals Time Taken: 11:28 AM, Height: 67 in, Weight: 125 lbs, BMI: 19.6, Temperature: 98.1 F, Pulse: 79 bpm, Respiratory Rate: 18 breaths/min, Blood Pressure: 112/69 mmHg. Respiratory work of breathing is normal. Cardiovascular Pedal pulses are palpable. General Notes: Wound exam; much better wound surface. No mechanical debridement is required the wound was washed off with Anasept and gauze. There is no surrounding infection edema is under good control Integumentary (Hair, Skin) Changes of chronic venous insufficiency is in the right leg. Wound #1 status is Open. Original cause of wound was Trauma. The wound is located on the Left,Medial Lower Leg. The wound measures 7.4cm length x 3.2cm width x 0.2cm depth; 18.598cm^2 area and 3.72cm^3 volume. There is Fat Layer (Subcutaneous Tissue) Exposed exposed. There is no tunneling or undermining noted. There is a medium amount of serosanguineous drainage noted. The wound margin is well defined and not attached to the wound base. There is medium (34-66%) red granulation within the wound bed. There is a medium (34-66%) amount of necrotic tissue within the wound bed including Adherent Slough. Assessment Active Problems ICD-10 Chronic venous hypertension (idiopathic) without complications of bilateral lower extremity Laceration without foreign body, left lower leg, subsequent encounter Non-pressure chronic ulcer of left calf with fat layer  exposed Cellulitis of left lower limb Procedures Wound #1 Pre-procedure diagnosis of Wound #1 is a Venous Leg Ulcer located on the Left,Medial Lower Leg . There was a Three Layer Compression Therapy Procedure by Levan Hurst, RN. Post procedure Diagnosis Wound #1: Same as Pre-Procedure Plan Follow-up Appointments: Return Appointment in 84 week. Dressing Change Frequency: Wound #1 Left,Medial Lower Leg: Do not change entire dressing for one week. Skin Barriers/Peri-Wound Care:  Wound #1 Left,Medial Lower Leg: Moisturizing lotion - to leg Wound Cleansing: Wound #1 Left,Medial Lower Leg: May shower with protection. Primary Wound Dressing: Wound #1 Left,Medial Lower Leg: Iodoflex Secondary Dressing: Wound #1 Left,Medial Lower Leg: Dry Gauze ABD pad Carboflex Edema Control: 3 Layer Compression System - Left Lower Extremity Avoid standing for long periods of time Elevate legs to the level of the heart or above for 30 minutes daily and/or when sitting, a frequency of: - throughout the day Exercise regularly Additional Orders / Instructions: Other: - out of work for 2 weeks 1. Initially traumatic wound in the right lower leg. We have been able to get the surface of this to clean up nicely. Continue Iodoflex for now 2. I think we can go ahead and order the Apligraf for next week. Electronic Signature(s) Signed: 10/01/2019 4:56:38 PM By: Linton Ham MD Entered By: Linton Ham on 10/01/2019 12:55:48 -------------------------------------------------------------------------------- SuperBill Details Patient Name: Date of Service: Kathryn [name] 10/01/2019 Medical Record [id number] Patient Account Number: [id number] Date of Birth/Sex: Treating RN: 25-Mar-1931 (86 y.o. [name] Primary Care Provider: Eliezer Lofts Other Clinician: Referring Provider: Treating Provider/Extender:Tabia Landowski, [name], [name] in Treatment: 4 Diagnosis Coding ICD-10  Codes Code Description I87.303 Chronic venous hypertension (idiopathic) without complications of bilateral lower extremity S81.812D Laceration without foreign body, left lower leg, subsequent encounter L97.222 Non-pressure chronic ulcer of left calf with fat layer exposed L03.116 Cellulitis of left lower limb Facility Procedures CPT4 Code Description: [id number] (Facility Use Only) [phone] - [place] [id number] LWR LT LEG Modifier: Quantity: 1 Physician Procedures CPT4 Code Description: [id number] - WC PHYS LEVEL 2 - EST PT ICD-10 Diagnosis Description S81.812D Laceration without foreign body, left lower leg, subsequent I87.303 Chronic venous hypertension (idiopathic) without complicati extremity [id number]  Non-pressure chronic ulcer of left calf with fat layer expo Modifier: encounter ons of bilateral sed Quantity: 1 lower Electronic Signature(s) Signed: 10/01/2019 4:56:38 PM By: Linton Ham MD Entered By: Linton Ham on 10/01/2019 12:56:09

## 2019-10-08 ENCOUNTER — Other Ambulatory Visit: Payer: Self-pay

## 2019-10-08 ENCOUNTER — Encounter (HOSPITAL_BASED_OUTPATIENT_CLINIC_OR_DEPARTMENT_OTHER): Payer: Medicare Other | Attending: Internal Medicine | Admitting: Internal Medicine

## 2019-10-08 DIAGNOSIS — I10 Essential (primary) hypertension: Secondary | ICD-10-CM | POA: Diagnosis not present

## 2019-10-08 DIAGNOSIS — I87303 Chronic venous hypertension (idiopathic) without complications of bilateral lower extremity: Secondary | ICD-10-CM | POA: Diagnosis not present

## 2019-10-08 DIAGNOSIS — L03116 Cellulitis of left lower limb: Secondary | ICD-10-CM | POA: Insufficient documentation

## 2019-10-08 DIAGNOSIS — L97222 Non-pressure chronic ulcer of left calf with fat layer exposed: Secondary | ICD-10-CM | POA: Diagnosis not present

## 2019-10-09 NOTE — Progress Notes (Addendum)
Kathryn [name], Kathryn [name] ([id number]) Visit Report for 10/08/2019 Arrival Information Details Patient Name: Date of Service: Kathryn [name], Kathryn [name] 10/08/2019 9:30 AM Medical Record [id number] Patient Account Number: [id number] Date of Birth/Sex: Treating RN: [birth year] (86 y.o. Orvan Falconer Primary Care Danely Bayliss: Eliezer Lofts Other Clinician: Referring Teva Bronkema: Treating Yevette Knust/Extender:Robson, [name], [name] in Treatment: 5 Visit Information History Since Last Visit All ordered tests and consults were completed: No Patient Arrived: Ambulatory Added or deleted any medications: No Arrival Time: 10:05 Any new allergies or adverse reactions: No Accompanied By: sister Had a fall or experienced change in No Transfer Assistance: None activities of daily living that may affect Patient Identification Verified: Yes risk of falls: Secondary Verification Process Completed: Yes Signs or symptoms of abuse/neglect since last No Patient Requires Transmission-Based No visito Precautions: Hospitalized since last visit: No Patient Has Alerts: No Implantable device outside of the clinic excluding No cellular tissue based products placed in the center since last visit: Has Dressing in Place as Prescribed: Yes Has Compression in Place as Prescribed: Yes Pain Present Now: No Electronic Signature(s) Signed: 10/08/2019 6:08:38 PM By: Carlene Coria RN Entered By: Carlene Coria on 10/08/2019 10:08:50 -------------------------------------------------------------------------------- Compression Therapy Details Patient Name: Date of Service: Kathryn [name], Kathryn [name] 10/08/2019 9:30 AM Medical Record [id number] Patient Account Number: [id number] Date of Birth/Sex: Treating RN: [birth year] (86 y.o. [name] Primary Care Brittaney Beaulieu: Eliezer Lofts Other Clinician: Referring Ayrianna Mcginniss: Treating Keilynn Marano/Extender:Robson, [name], [name] in Treatment: 5 Compression Therapy Performed for  Wound Wound #1 Left,Medial Lower Leg Assessment: Performed By: Clinician Levan Hurst, RN Compression Type: Three Layer Post Procedure Diagnosis Same as Pre-procedure Electronic Signature(s) Signed: 10/09/2019 6:18:44 PM By: Levan Hurst RN, BSN Entered By: Levan Hurst on 10/08/2019 11:23:42 -------------------------------------------------------------------------------- Encounter Discharge Information Details Patient Name: Date of Service: Kathryn [name] 10/08/2019 9:30 AM Medical Record [id number] Patient Account Number: [id number] Date of Birth/Sex: Treating RN: 1931/02/28 (86 y.o. [name] Primary Care Isys Tietje: Eliezer Lofts Other Clinician: Referring Mico Spark: Treating Kyomi Hector/Extender:Robson, [name], [name] in Treatment: 5 Encounter Discharge Information Items Post Procedure Vitals Discharge Condition: Stable Temperature (F): 98.3 Ambulatory Status: Ambulatory Pulse (bpm): 77 Discharge Destination: Home Respiratory Rate (breaths/min): 18 Transportation: Private Auto Blood Pressure (mmHg): 149/91 Accompanied By: friend Schedule Follow-up Appointment: Yes Clinical Summary of Care: Patient Declined Electronic Signature(s) Signed: 10/09/2019 6:12:59 PM By: Baruch Gouty RN, BSN Entered By: Baruch Gouty on 10/08/2019 11:42:58 -------------------------------------------------------------------------------- Lower Extremity Assessment Details Patient Name: Date of Service: Kathryn [name] 10/08/2019 9:30 AM Medical Record [id number] Patient Account Number: [id number] Date of Birth/Sex: Treating RN: 03-30-1931 (86 y.o. Orvan Falconer Primary Care Bradin Mcadory: Eliezer Lofts Other Clinician: Referring Symone Cornman: Treating Yesha Muchow/Extender:Robson, [name], [name] in Treatment: 5 Edema Assessment Assessed: [Left: No] [Right: No] Edema: [Left: N] [Right: o] Calf Left: Right: Point of Measurement: cm From Medial Instep 30 cm  cm Ankle Left: Right: Point of Measurement: cm From Medial Instep 20 cm cm Electronic Signature(s) Signed: 10/08/2019 6:08:38 PM By: Carlene Coria RN Entered By: Carlene Coria on 10/08/2019 10:17:42 -------------------------------------------------------------------------------- Multi Wound Chart Details Patient Name: Date of Service: Kathryn [name] 10/08/2019 9:30 AM Medical Record [id number] Patient Account Number: [id number] Date of Birth/Sex: Treating RN: Jan 04, 1931 (86 y.o. F) Primary Care Ibeth Fahmy: Eliezer Lofts Other Clinician: Referring Emileigh Kellett: Treating Laron Boorman/Extender:Robson, [name], [name] in Treatment: 5 Vital Signs Height(in): 67 Pulse(bpm): 77 Weight(lbs): 125 Blood Pressure(mmHg): 149/91 Body Mass Index(BMI): 20 Temperature(F): 98.3 Respiratory 16 Rate(breaths/min): Photos: [1:No Photos] [N/A:N/A] Wound Location: [1:Left Lower  Leg - Medial] [N/A:N/A] Wounding Event: [1:Trauma] [N/A:N/A] Primary Etiology: [1:Venous Leg Ulcer] [N/A:N/A] Comorbid History: [1:Cataracts, Osteoarthritis, N/A Confinement Anxiety] Date Acquired: [1:05/29/2019] [N/A:N/A] Weeks of Treatment: [1:5] [N/A:N/A] Wound Status: [1:Open] [N/A:N/A] Measurements L x W x D 6.6x3.9x0.2 [N/A:N/A] (cm) Area (cm) : [1:20.216] [N/A:N/A] Volume (cm) : [1:4.043] [N/A:N/A] % Reduction in Area: [1:-102.40%] [N/A:N/A] % Reduction in Volume: -34.90% [N/A:N/A] Classification: [1:Full Thickness Without Exposed Support Structures] [N/A:N/A] Exudate Amount: [1:Medium] [N/A:N/A] Exudate Type: [1:Serosanguineous] [N/A:N/A] Exudate Color: [1:red, brown] [N/A:N/A] Wound Margin: [1:Well defined, not attached N/A] Granulation Amount: [1:Medium (34-66%)] [N/A:N/A] Granulation Quality: [1:Red] [N/A:N/A] Necrotic Amount: [1:Medium (34-66%)] [N/A:N/A] Exposed Structures: [1:Fat Layer (Subcutaneous Tissue) Exposed: Yes Fascia: No Tendon: No Muscle: No Joint: No Bone: No]  [N/A:N/A] Epithelialization: [1:Small (1-33%)] [N/A:N/A] Procedures Performed: [1:Cellular or Tissue Based Product Compression Therapy] [N/A:N/A] Treatment Notes Wound #1 (Left, Medial Lower Leg) 2. Periwound Care Moisturizing lotion Skin Prep 3. Primary Dressing Applied Cellular Based Tissue Product 4. Secondary Dressing ABD Pad Other secondary dressing (specify in notes) 6. Support Layer Applied 3 layer compression wrap Notes apligraf, adaptic secured with steristrips, carboflex as secondary. netting. Electronic Signature(s) Signed: 10/08/2019 6:04:26 PM By: Linton Ham MD Entered By: Linton Ham on 10/08/2019 11:44:30 -------------------------------------------------------------------------------- Multi-Disciplinary Care Plan Details Patient Name: Date of Service: Kathryn [name] 10/08/2019 9:30 AM Medical Record [id number] Patient Account Number: [id number] Date of Birth/Sex: Treating RN: 07-13-1931 (86 y.o. [name] Primary Care Mayrin Schmuck: Eliezer Lofts Other Clinician: Referring Shamonique Battiste: Treating Rayssa Atha/Extender:Robson, [name], [name] in Treatment: 5 Active Inactive Wound/Skin Impairment Nursing Diagnoses: Knowledge deficit related to ulceration/compromised skin integrity Goals: Patient/caregiver will verbalize understanding of skin care regimen Date Initiated: 08/28/2019 Target Resolution Date: 11/02/2019 Goal Status: Active Ulcer/skin breakdown will have a volume reduction of 30% by week 4 Date Initiated: 08/28/2019 Date Inactivated: 10/01/2019 Target Resolution Date: 10/05/2019 Goal Status: Met Ulcer/skin breakdown will have a volume reduction of 50% by week 8 Date Initiated: 10/01/2019 Target Resolution Date: 11/02/2019 Goal Status: Active Interventions: Assess patient/caregiver ability to obtain necessary supplies Assess patient/caregiver ability to perform ulcer/skin care regimen upon admission and as needed Assess ulceration(s)  every visit Notes: Electronic Signature(s) Signed: 10/09/2019 6:18:44 PM By: Levan Hurst RN, BSN Entered By: Levan Hurst on 10/08/2019 10:39:19 -------------------------------------------------------------------------------- Pain Assessment Details Patient Name: Date of Service: Kathryn [name] 10/08/2019 9:30 AM Medical Record [id number] Patient Account Number: [id number] Date of Birth/Sex: Treating RN: [birth year] (86 y.o. Orvan Falconer Primary Care Roshelle Traub: Eliezer Lofts Other Clinician: Referring Saanvi Hakala: Treating Embrie Mikkelsen/Extender:Robson, [name], [name] in Treatment: 5 Active Problems Location of Pain Severity and Description of Pain Patient Has Paino No Site Locations Pain Management and Medication Current Pain Management: Electronic Signature(s) Signed: 10/08/2019 6:08:38 PM By: Carlene Coria RN Entered By: Carlene Coria on 10/08/2019 10:09:37 -------------------------------------------------------------------------------- Patient/Caregiver Education Details Patient Name: Date of Service: Kathryn [name] 1/11/2021andnbsp9:30 AM Medical Record [id number] Patient Account Number: [id number] Date of Birth/Gender: 12-30-1930 (86 y.o. F) Treating RN: Levan Hurst Primary Care Physician: Eliezer Lofts Other Clinician: Referring Physician: Treating Physician/Extender:Robson, [name], [name] in Treatment: 5 Education Assessment Education Provided To: Patient Education Topics Provided Wound/Skin Impairment: Methods: Explain/Verbal Responses: State content correctly Electronic Signature(s) Signed: 10/09/2019 6:18:44 PM By: Levan Hurst RN, BSN Entered By: Levan Hurst on 10/08/2019 10:39:29 -------------------------------------------------------------------------------- Wound Assessment Details Patient Name: Date of Service: Kathryn [name] 10/08/2019 9:30 AM Medical Record [id number] Patient Account Number:  [id number] Date of Birth/Sex: Treating RN: [birth year] (86 y.o. Orvan Falconer Primary Care Jheri Mitter: Diona Browner,  Amy Other Clinician: Referring Dwyane Dupree: Treating Casey Maxfield/Extender:Robson, [name], [name] in Treatment: 5 Wound Status Wound Number: 1 Primary Venous Leg Ulcer Etiology: Wound Location: Left Lower Leg - Medial Wound Status: Open Wounding Event: Trauma Comorbid Cataracts, Osteoarthritis, Confinement Date Acquired: 05/29/2019 History: Anxiety History: Anxiety Weeks Of Treatment: 5 Clustered Wound: No Photos Wound Measurements Length: (cm) 6.6 % Re Width: (cm) 3.9 % Re Depth: (cm) 0.2 Epit Area: (cm) 20.216 Tun Volume: (cm) 4.043 Und Wound Description Classification: Full Thickness Without Exposed Support Structures Wound Well defined, not attached Margin: Exudate Medium Amount: Exudate Serosanguineous Type: Exudate red, brown Color: Wound Bed Granulation Amount: Medium (34-66%) Granulation Quality: Red Necrotic Amount: Medium (34-66%) Necrotic Quality: Adherent Slough Foul Odor After Cleansing: No Slough/Fibrino Yes Exposed Structure Fascia Exposed: No Fat Layer (Subcutaneous Tissue) Exposed: Yes Tendon Exposed: No Muscle Exposed: No Joint Exposed: No Bone Exposed: No duction in Area: -102.4% duction in Volume: -34.9% helialization: Small (1-33%) neling: No ermining: No Treatment Notes Wound #1 (Left, Medial Lower Leg) 2. Periwound Care Moisturizing lotion Skin Prep 3. Primary Dressing Applied Cellular Based Tissue Product 4. Secondary Dressing ABD Pad Other secondary dressing (specify in notes) 6. Support Layer Applied 3 layer compression wrap Notes apligraf, adaptic secured with steristrips, carboflex as secondary. netting. Electronic Signature(s) Signed: 10/11/2019 3:33:56 PM By: Mikeal Hawthorne EMT/HBOT Signed: 10/11/2019 6:24:40 PM By: Carlene Coria RN Previous Signature: 10/08/2019 6:08:38 PM Version By: Carlene Coria  RN Entered By: Mikeal Hawthorne on 10/11/2019 13:55:22 -------------------------------------------------------------------------------- Vitals Details Patient Name: Date of Service: Kathryn [name] 10/08/2019 9:30 AM Medical Record [id number] Patient Account Number: [id number] Date of Birth/Sex: Treating RN: [birth year] (86 y.o. Orvan Falconer Primary Care Alyjah Lovingood: Eliezer Lofts Other Clinician: Referring Azarria Balint: Treating Jestin Burbach/Extender:Robson, [name], [name] in Treatment: 5 Vital Signs Time Taken: 10:08 Temperature (F): 98.3 Height (in): 67 Pulse (bpm): 77 Weight (lbs): 125 Respiratory Rate (breaths/min): 16 Body Mass Index (BMI): 19.6 Blood Pressure (mmHg): 149/91 Reference Range: 80 - 120 mg / dl Electronic Signature(s) Signed: 10/08/2019 6:08:38 PM By: Carlene Coria RN Entered By: Carlene Coria on 10/08/2019 10:09:30

## 2019-10-09 NOTE — Progress Notes (Addendum)
ERMINIA, NASUTI ([id number]) Visit Report for 10/08/2019 Cellular or Tissue Based Product Details Patient Name: Date of Service: Kathryn [name], Kathryn [name] 10/08/2019 9:30 AM Medical Record [id number] Patient Account Number: [id number] Date of Birth/Sex: Treating RN: 10-26-1930 (86 y.o. Kathryn [name] Primary Care Provider: Eliezer [name] Other Clinician: Referring Provider: Treating Provider/Extender:Kathryn [name], Kathryn [name], Kathryn [name] in Treatment: 5 Cellular or Tissue Based Wound #1 Left,Medial Lower Leg Product Type Applied to: Performed By: Physician Kathryn [name]., MD Cellular or Tissue Based Apligraf Product Type: Level of Consciousness (Pre- Awake and Alert procedure): Pre-procedure Yes - 11:20 Verification/Time Out Taken: Location: trunk / arms / legs Wound Size (sq cm): 25.74 Product Size (sq cm): 44 Waste Size (sq cm): 0 Amount of Product Applied (sq cm): 44 Lot #: [id number] Order #: 1 Expiration Date: 10/12/2019 Fenestrated: Yes Instrument: Blade Reconstituted: Yes Solution Type: normal saline Solution Amount: 10 ml Lot #[id number] Solution Expiration Date: 05/27/2021 Secured: Yes Secured With: Steri-Strips Dressing Applied: Yes Primary Dressing: gauze, ABD, 3 layer compression Procedural Pain: 0 Post Procedural Pain: 0 Response to Treatment: Procedure was tolerated well Level of Consciousness Awake and Alert (Post-procedure): Post Procedure Diagnosis Same as Pre-procedure Electronic Signature(s) Signed: 10/08/2019 6:04:26 PM By: Kathryn Ham MD Signed: 10/09/2019 6:18:44 PM By: Kathryn Hurst RN, BSN Entered By: Kathryn [name] on 10/08/2019 11:23:31 -------------------------------------------------------------------------------- HPI Details Patient Name: Date of Service: Kathryn [name] 10/08/2019 9:30 AM Medical Record [id number] Patient Account Number: [id number] Date of Birth/Sex: Treating RN: 11/09/1930 (86 y.o. F) Primary Care Provider:  Eliezer [name] Other Clinician: Referring Provider: Treating Provider/Extender:Kathryn [name], Kathryn [name], Kathryn [name] in Treatment: 5 History of Present Illness HPI Description: ADMISSION 08/28/19 This is an 86 year old woman who is still very active still working full-time at [employer] Sometime in late September her left calf was hit by the harness of her dog who was chasing another person. She was left with a fairly clear skin tear she tried to pull the skin back over the wound but it it did not maintain. She was seen in her primary doctor's office on 07/20/2019 she was given a topical antibiotic and cephalexin. She was reviewed in primary care on 11/3 and it was felt the wound might need debridement and she was sent here. The patient has chronic venous insufficiency however she does not have a history of recurrent chronic wounds. Past medical history includes cellulitis of the left foot, hyperlipidemia, irregular heart rate, colonic polyps., Low back pain, B12 deficiency, Dupuytren's contractures, recurrent UTIs she has a history of bilateral hip surgery for osteoarthritis ABI in our clinic was 0.94 on the left 12/8; deep laceration wound on the left medial mid tibia. We are using Iodoflex under compression. She is not eligible for home health 12/15; deep laceration wound on the left mid tibia. We are using Iodoflex under compression. Perhaps some minimal improvement in wound surface certainly no improvement in surface area 12/18; the patient called in to report drainage coming out of the wound and increasing pain. She was brought in for a nurse visit. Noted to have serosanguineous drainage and I was asked to look at this. 12/21; 3-day follow-up. Culture I did on 12/18 grew multiple organisms but none predominated. In spite of this the doxycycline really seems to have helped there is much less surrounding erythema. We allowed her to change the dressing over the weekend. The patient still  works at [employer] There is edema around the wound because we did not wrap her last time. I wanted her to  be able to continue to monitor the degree of erythema 12/28; the patient is not working and telling me she is staying off her foot is much as possible. She was approved for Apligraf but there was still too much debris over the wound surface today to consider this. Extensive debridement change dressing to Iodoflex 10/01/2019. The patient's wound is in a much better surface this week. We will order the Apligraf but continue Iodoflex for this week 1/11; healthy looking wound surface. Apligraf #1. No evidence of infection Electronic Signature(s) Signed: 10/08/2019 6:04:26 PM By: Kathryn Ham MD Entered By: Kathryn [name] on 10/08/2019 11:44:52 -------------------------------------------------------------------------------- Physical Exam Details Patient Name: Date of Service: Kathryn [name] 10/08/2019 9:30 AM Medical Record [id number] Patient Account Number: [id number] Date of Birth/Sex: Treating RN: [birth year] (86 y.o. F) Primary Care Provider: Eliezer [name] Other Clinician: Referring Provider: Treating Provider/Extender:Kathryn [name], Kathryn [name], Kathryn [name] in Treatment: 5 Constitutional Patient is hypertensive.. Pulse regular and within target range for patient.Marland Kitchen Respirations regular, non-labored and within target range.. Temperature is normal and within the target range for the patient.Marland Kitchen Appears in no distress. Notes Wound exam; continued improvement in the wound surface. No mechanical debridement is required. The wound was washed off with Anasept and gauze. Apligraf #1 applied in the standard fashion Electronic Signature(s) Signed: 10/08/2019 6:04:26 PM By: Kathryn Ham MD Entered By: Kathryn [name] on 10/08/2019 11:45:27 -------------------------------------------------------------------------------- Physician Orders Details Patient Name: Date of Service: Kathryn [name] 10/08/2019 9:30 AM Medical Record [id number] Patient Account Number: [id number] Date of Birth/Sex: Treating RN: September 24, 1931 (86 y.o. Kathryn [name] Primary Care Provider: Eliezer [name] Other Clinician: Referring Provider: Treating Provider/Extender:Avery Klingbeil, Kathryn [name], Kathryn [name] in Treatment: 5 Verbal / Phone Orders: No Diagnosis Coding ICD-10 Coding Code Description I87.303 Chronic venous hypertension (idiopathic) without complications of bilateral lower extremity S81.812D Laceration without foreign body, left lower leg, subsequent encounter L97.222 Non-pressure chronic ulcer of left calf with fat layer exposed L03.116 Cellulitis of left lower limb Follow-up Appointments Return Appointment in 2 [name]. - MD Nurse Visit: - 1 week for rewrap Dressing Change Frequency Wound #1 Left,Medial Lower Leg Do not change entire dressing for one week. Skin Barriers/Peri-Wound Care Wound #1 Left,Medial Lower Leg Moisturizing lotion - to leg Wound Cleansing Wound #1 Left,Medial Lower Leg May shower with protection. 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 Kathryn [name] Primary Care Provider: Eliezer [name] Other Clinician: Referring Provider: Treating Provider/Extender:Collen Vincent, Kathryn [name], Kathryn [name] in Treatment: 5 Active Problems ICD-10 Evaluated Encounter Code Description Active Date Today Diagnosis I87.303 Chronic venous hypertension (idiopathic) without 99991111 No Yes complications of bilateral lower extremity S81.812D Laceration without foreign body, left lower leg, 08/28/2019 No Yes subsequent encounter L97.222 Non-pressure chronic  ulcer of left calf with fat layer 08/28/2019 No Yes exposed L03.116 Cellulitis of left lower limb 09/14/2019 No Yes Inactive Problems Resolved Problems Electronic Signature(s) Signed: 10/08/2019 6:04:26 PM By: Kathryn Ham MD Entered By: Kathryn [name] on 10/08/2019 11:44:23 -------------------------------------------------------------------------------- Progress Note Details Patient Name: Date of Service: Kathryn [name] 10/08/2019 9:30 AM Medical Record [id number] Patient Account Number: [id number] Date of Birth/Sex: Treating RN: 1931/01/23 (86 y.o. F) Primary Care Provider: Eliezer [name] Other Clinician: Referring Provider: Treating Provider/Extender:Delma Drone, Kathryn [name], Kathryn [name] in Treatment: 5 Subjective History of Present Illness (HPI) ADMISSION 08/28/19 This is an 86 year old woman who is still very active still working full-time at [employer] Sometime in late September her left calf was hit by the harness of her dog who was chasing another person. She was left with a fairly clear skin tear she tried to pull the skin back over the wound but it it did not maintain. She was seen in her primary doctor's office on 07/20/2019 she was given a topical antibiotic and cephalexin. She was reviewed in primary care on 11/3 and it was felt the  wound might need debridement and she was sent here. The patient has chronic venous insufficiency however she does not have a history of recurrent chronic wounds. Past medical history includes cellulitis of the left foot, hyperlipidemia, irregular heart rate, colonic polyps., Low back pain, B12 deficiency, Dupuytren's contractures, recurrent UTIs she has a history of bilateral hip surgery for osteoarthritis ABI in our clinic was 0.94 on the left 12/8; deep laceration wound on the left medial mid tibia. We are using Iodoflex under compression. She is not eligible for home health 12/15; deep laceration wound on the left mid tibia. We are using Iodoflex under compression. Perhaps some minimal improvement in wound surface certainly no improvement in surface area 12/18; the patient called in to report drainage coming out of the wound and increasing pain. She was brought in for a nurse visit. Noted to have serosanguineous drainage and I was asked to look at this. 12/21; 3-day follow-up. Culture I did on 12/18 grew multiple organisms but none predominated. In spite of this the doxycycline really seems to have helped there is much less surrounding erythema. We allowed her to change the dressing over the weekend. The patient still works at [employer] There is edema around the wound because we did not wrap her last time. I wanted her to be able to continue to monitor the degree of erythema 12/28; the patient is not working and telling me she is staying off her foot is much as possible. She was approved for Apligraf but there was still too much debris over the wound surface today to consider this. Extensive debridement change dressing to Iodoflex 10/01/2019. The patient's wound is in a much better surface this week. We will order the Apligraf but continue Iodoflex for this week 1/11; healthy looking wound surface. Apligraf #1. No evidence of infection Objective Constitutional Patient is hypertensive..  Pulse regular and within target range for patient.Marland Kitchen Respirations regular, non-labored and within target range.. Temperature is normal and within the target range for the patient.Marland Kitchen Appears in no distress. Vitals Time Taken: 10:08 AM, Height: 67 in, Weight: 125 lbs, BMI: 19.6, Temperature: 98.3 F, Pulse: 77 bpm, Respiratory Rate: 16 breaths/min, Blood Pressure: 149/91 mmHg. General Notes: Wound exam; continued improvement in the wound surface. No mechanical debridement is required. The wound was washed off with Anasept and gauze. Apligraf #1 applied in the standard fashion Integumentary (Hair, Skin) Wound #1  status is Open. Original cause of wound was Trauma. The wound is located on the Left,Medial Lower Leg. The wound measures 6.6cm length x 3.9cm width x 0.2cm depth; 20.216cm^2 area and 4.043cm^3 volume. There is Fat Layer (Subcutaneous Tissue) Exposed exposed. There is no tunneling or undermining noted. There is a medium amount of serosanguineous drainage noted. The wound margin is well defined and not attached to the wound base. There is medium (34-66%) red granulation within the wound bed. There is a medium (34-66%) amount of necrotic tissue within the wound bed including Adherent Slough. Assessment Active Problems ICD-10 Chronic venous hypertension (idiopathic) without complications of bilateral lower extremity Laceration without foreign body, left lower leg, subsequent encounter Non-pressure chronic ulcer of left calf with fat layer exposed Cellulitis of left lower limb Procedures Wound #1 Pre-procedure diagnosis of Wound #1 is a Venous Leg Ulcer located on the Left,Medial Lower Leg. A skin graft procedure using a bioengineered skin substitute/cellular or tissue based product was performed by Kathryn [name]., MD. Apligraf was applied and secured with Steri-Strips. 44 sq cm of product was utilized and 0 sq cm was wasted. Post Application, gauze, ABD, 3 layer compression was applied.  A Time Out was conducted at 11:20, prior to the start of the procedure. The procedure was tolerated well with a pain level of 0 throughout and a pain level of 0 following the procedure. Post procedure Diagnosis Wound #1: Same as Pre-Procedure . Pre-procedure diagnosis of Wound #1 is a Venous Leg Ulcer located on the Left,Medial Lower Leg . There was a Three Layer Compression Therapy Procedure by Kathryn Hurst, RN. Post procedure Diagnosis Wound #1: Same as Pre-Procedure Plan Follow-up Appointments: Return Appointment in 2 [name]. - MD Nurse Visit: - 1 week for rewrap Dressing Change Frequency: Wound #1 Left,Medial Lower Leg: Do not change entire dressing for one week. Skin Barriers/Peri-Wound Care: Wound #1 Left,Medial Lower Leg: Moisturizing lotion - to leg Wound Cleansing: Wound #1 Left,Medial Lower Leg: May shower with protection. Primary Wound Dressing: Wound #1 Left,Medial Lower Leg: Skin Substitute Application - Apligraf #1 Secondary Dressing: Wound #1 Left,Medial Lower Leg: Adaptic Dressing - secure with steri strips Dry Gauze ABD pad Carboflex Edema Control: 3 Layer Compression System - Left Lower Extremity Avoid standing for long periods of time Elevate legs to the level of the heart or above for 30 minutes daily and/or when sitting, a frequency of: - throughout the day Exercise regularly Additional Orders / Instructions: Other: - out of work for 2 [name] 1. Apligraf #1 2. Nurse visit next week. We will review the wound in 2 [name] Electronic Signature(s) Signed: 10/08/2019 6:04:26 PM By: Kathryn Ham MD Entered By: Kathryn [name] on 10/08/2019 11:46:26 -------------------------------------------------------------------------------- SuperBill Details Patient Name: Date of Service: Kathryn [name] 10/08/2019 Medical Record [id number] Patient Account Number: [id number] Date of Birth/Sex: Treating RN: [birth year] (86 y.o. F) Primary Care Provider: Eliezer [name] Other Clinician: Referring Provider: Treating Provider/Extender:Nashid Pellum, Kathryn [name], Kathryn [name] in Treatment: 5 Diagnosis Coding ICD-10 Codes Code Description I87.303 Chronic venous hypertension (idiopathic) without complications of bilateral lower extremity S81.812D Laceration without foreign body, left lower leg, subsequent encounter L97.222 Non-pressure chronic ulcer of left calf with fat layer exposed L03.116 Cellulitis of left lower limb Facility Procedures CPT4 Code Description: [id number] (Facility Use Only) Apligraf 1 SQ CM Modifier: Quantity: 30 CPT4 Code Description: [id number] - SKIN SUB GRAFT TRNK/ARM/LEG ICD-10 Diagnosis Description L97.222 Non-pressure chronic ulcer of left calf with fat layer expo Modifier: sed Quantity: 1 CPT4 Code  Description: [id number] - SKIN SUB GRAFT T/A/L ADD-ON ICD-10 Diagnosis Description I87.303 Chronic venous hypertension (idiopathic) without complicati extremity [id number] Non-pressure chronic ulcer of left calf with fat layer expo Modifier: ons of bilatera sed Quantity: 1 l lower Physician Procedures CPT4: Code [id number] Description: 271 - WC PHYS SKIN SUB GRAFT TRNK/ARM/LEG ICD-10 Diagnosis Description L97.222 Non-pressure chronic ulcer of left calf with fat layer expos Modifier: ed Quantity: 1 Electronic Signature(s) Signed: 10/23/2019 5:32:11 PM By: Kathryn Ham MD Signed: 11/22/2019 2:24:29 PM By: Kathryn Hurst RN, BSN Previous Signature: 10/08/2019 6:04:26 PM Version By: Kathryn Ham MD Entered By: Kathryn [name] on 10/22/2019 18:12:06

## 2019-10-15 ENCOUNTER — Other Ambulatory Visit: Payer: Self-pay | Admitting: Family Medicine

## 2019-10-15 ENCOUNTER — Encounter (HOSPITAL_BASED_OUTPATIENT_CLINIC_OR_DEPARTMENT_OTHER): Payer: Medicare Other | Admitting: Internal Medicine

## 2019-10-15 ENCOUNTER — Telehealth: Payer: Self-pay | Admitting: Family Medicine

## 2019-10-15 ENCOUNTER — Other Ambulatory Visit: Payer: Self-pay

## 2019-10-15 DIAGNOSIS — E538 Deficiency of other specified B group vitamins: Secondary | ICD-10-CM

## 2019-10-15 DIAGNOSIS — D649 Anemia, unspecified: Secondary | ICD-10-CM

## 2019-10-15 DIAGNOSIS — E7800 Pure hypercholesterolemia, unspecified: Secondary | ICD-10-CM

## 2019-10-15 DIAGNOSIS — Z1321 Encounter for screening for nutritional disorder: Secondary | ICD-10-CM

## 2019-10-15 DIAGNOSIS — L03116 Cellulitis of left lower limb: Secondary | ICD-10-CM | POA: Diagnosis not present

## 2019-10-15 DIAGNOSIS — I872 Venous insufficiency (chronic) (peripheral): Secondary | ICD-10-CM | POA: Diagnosis not present

## 2019-10-15 DIAGNOSIS — L97222 Non-pressure chronic ulcer of left calf with fat layer exposed: Secondary | ICD-10-CM | POA: Diagnosis not present

## 2019-10-15 NOTE — Telephone Encounter (Signed)
-----   Message from [name] sent at 10/09/2019  3:05 PM EST ----- Regarding: Lab orders for Tuesday, 1.19.21 Patient is scheduled for CPX labs, please order future labs, Thanks , [name]

## 2019-10-15 NOTE — Progress Notes (Signed)
Kathryn [name], Kathryn [name] ([id number]) Visit Report for 10/15/2019 Arrival Information Details Patient Name: Date of Service: Kathryn [name], Kathryn [name] 10/15/2019 10:15 AM Medical Record [id number] Patient Account Number: [id number] Date of Birth/Sex: Treating RN: [name] (86 y.o. Kathryn [name], Kathryn [name] Primary Care Kathryn [name]: Kathryn [name] Other Clinician: Referring Kathryn [name]: Treating Kathryn [name]/Extender:Kathryn [name], Kathryn [name] in Treatment: 6 Visit Information History Since Last Visit Added or deleted any medications: No Patient Arrived: Ambulatory Any new allergies or adverse reactions: No Arrival Time: 10:20 Had a fall or experienced change in No Accompanied By: self activities of daily living that may affect Transfer Assistance: None risk of falls: Patient Identification Verified: Yes Signs or symptoms of abuse/neglect since last No Secondary Verification Process Yes visito Completed: Hospitalized since last visit: No Patient Requires Transmission-Based No Implantable device outside of the clinic excluding No Precautions: cellular tissue based products placed in the center Patient Has Alerts: No since last visit: Has Dressing in Place as Prescribed: Yes Has Compression in Place as Prescribed: Yes Pain Present Now: No Electronic Signature(s) Signed: 10/15/2019 5:35:37 PM By: Baruch Gouty RN, BSN Entered By: Baruch Gouty on 10/15/2019 10:23:53 -------------------------------------------------------------------------------- Compression Therapy Details Patient Name: Date of Service: Kathryn [name] 10/15/2019 10:15 AM Medical Record [id number] Patient Account Number: [id number] Date of Birth/Sex: Treating RN: Kathryn [name] (86 y.o. Kathryn [name] Primary Care Hallee Mckenny: Kathryn [name] Other Clinician: Referring Marcel Sorter: Treating Izaiha Lo/Extender:Kathryn [name], Kathryn [name] in Treatment: 6 Compression Therapy Performed for Wound Wound #1 Left,Medial Lower  Leg Assessment: Performed By: Clinician Baruch Gouty, RN Compression Type: Three Hydrologist) Signed: 10/15/2019 5:35:37 PM By: Baruch Gouty RN, BSN Entered By: Baruch Gouty on 10/15/2019 10:40:55 -------------------------------------------------------------------------------- Encounter Discharge Information Details Patient Name: Date of Service: Kathryn [name] 10/15/2019 10:15 AM Medical Record [id number] Patient Account Number: [id number] Date of Birth/Sex: Treating RN: Kathryn [name] (86 y.o. Kathryn [name] Primary Care Syrai Gladwin: Kathryn [name] Other Clinician: Referring Fairy Ashlock: Treating Skylen Spiering/Extender:Kathryn [name], Kathryn [name] in Treatment: 6 Encounter Discharge Information Items Discharge Condition: Stable Ambulatory Status: Ambulatory Discharge Destination: Home Transportation: Private Auto Accompanied By: alone Schedule Follow-up Appointment: Yes Clinical Summary of Care: Patient Declined Electronic Signature(s) Signed: 10/15/2019 5:35:37 PM By: Baruch Gouty RN, BSN Entered By: Baruch Gouty on 10/15/2019 10:48:33 -------------------------------------------------------------------------------- Patient/Caregiver Education Details Patient Name: Date of Service: Kathryn [name] 1/18/2021andnbsp10:15 AM Medical Record Patient Account Number: [id number] Number: Treating RN: Baruch Gouty Date of Birth/Gender: [name] (86 y.o. Other Clinician: F) Treating Linton Ham Primary Care Physician: Kathryn [name] Physician/Extender: Referring Physician: Leslye Peer in Treatment: 6 Education Assessment Education Provided To: Patient Education Topics Provided Venous: Methods: Explain/Verbal Responses: Reinforcements needed, State content correctly Wound/Skin Impairment: Methods: Explain/Verbal Responses: Reinforcements needed, State content correctly Electronic Signature(s) Signed: 10/15/2019 5:35:37  PM By: Baruch Gouty RN, BSN Entered By: Baruch Gouty on 10/15/2019 10:48:18 -------------------------------------------------------------------------------- Wound Assessment Details Patient Name: Date of Service: Kathryn [name] 10/15/2019 10:15 AM Medical Record [id number] Patient Account Number: [id number] Date of Birth/Sex: Treating RN: [name] (86 y.o. Kathryn [name] Primary Care Noah Lembke: Kathryn [name] Other Clinician: Referring Pericles Carmicheal: Treating Catalena Stanhope/Extender:Kathryn [name], Kathryn [name] in Treatment: 6 Wound Status Wound Number: 1 Primary Venous Leg Ulcer Etiology: Wound Location: Left Lower Leg - Medial Wound Status: Open Wounding Event: Trauma Comorbid Cataracts, Osteoarthritis, Confinement Date Acquired: 05/29/2019 History: Anxiety [name] Of Treatment: 6 Clustered Wound: No Wound Measurements Length: (cm) 6.6 % Reduct Width: (cm) 3.9 % Reduct Depth: (cm) 0.2 Epitheli Area: (cm) 20.216 Tunneli Volume: (cm) 4.043 Undermi Wound Description Classification:  Full Thickness Without Exposed Support Structures Wound Well defined, not attached Margin: Exudate Large Amount: Exudate Serosanguineous Type: Exudate red, brown Color: Wound Bed Granulation Amount: Medium (34-66%) Granulation Quality: Red Necrotic Amount: Medium (34-66%) Necrotic Quality: Adherent Slough Foul Odor After Cleansing: No Slough/Fibrino Yes Exposed Structure Fascia Exposed: No Fat Layer (Subcutaneous Tissue) Exposed: Yes Tendon Exposed: No Muscle Exposed: No Joint Exposed: No Bone Exposed: No ion in Area: -102.4% ion in Volume: -34.9% alization: Small (1-33%) ng: No ning: No Assessment Notes apligraf intact Treatment Notes Wound #1 (Left, Medial Lower Leg) 2. Periwound Care Barrier cream Moisturizing lotion TCA Cream 4. Secondary Dressing ABD Pad Other secondary dressing (specify in notes) 6. Support Layer Applied 3 layer compression  wrap Notes apligraf dressing intact, carboflex as secondary. netting. Electronic Signature(s) Signed: 10/15/2019 5:35:37 PM By: Baruch Gouty RN, BSN Entered By: Baruch Gouty on 10/15/2019 10:40:23 -------------------------------------------------------------------------------- Vitals Details Patient Name: Date of Service: Kathryn [name] 10/15/2019 10:15 AM Medical Record [id number] Patient Account Number: [id number] Date of Birth/Sex: Treating RN: [birth year] (86 y.o. Kathryn [name] Primary Care Faige Seely: Kathryn [name] Other Clinician: Referring Miyonna Ormiston: Treating Quantrell Splitt/Extender:Kathryn [name], Kathryn [name] in Treatment: 6 Vital Signs Time Taken: 10:25 Temperature (F): 98.2 Height (in): 67 Pulse (bpm): 79 Source: Stated Respiratory Rate (breaths/min): 18 Weight (lbs): 125 Blood Pressure (mmHg): 149/97 Source: Stated Reference Range: 80 - 120 mg / dl Body Mass Index (BMI): 19.6 Electronic Signature(s) Signed: 10/15/2019 5:35:37 PM By: Baruch Gouty RN, BSN Entered By: Baruch Gouty on 10/15/2019 10:25:51

## 2019-10-16 ENCOUNTER — Ambulatory Visit: Payer: Self-pay

## 2019-10-16 ENCOUNTER — Other Ambulatory Visit: Payer: Self-pay

## 2019-10-16 ENCOUNTER — Other Ambulatory Visit (INDEPENDENT_AMBULATORY_CARE_PROVIDER_SITE_OTHER): Payer: Medicare Other

## 2019-10-16 ENCOUNTER — Ambulatory Visit (INDEPENDENT_AMBULATORY_CARE_PROVIDER_SITE_OTHER): Payer: Medicare Other

## 2019-10-16 DIAGNOSIS — E538 Deficiency of other specified B group vitamins: Secondary | ICD-10-CM | POA: Diagnosis not present

## 2019-10-16 DIAGNOSIS — Z0000 Encounter for general adult medical examination without abnormal findings: Secondary | ICD-10-CM

## 2019-10-16 DIAGNOSIS — D649 Anemia, unspecified: Secondary | ICD-10-CM | POA: Diagnosis not present

## 2019-10-16 DIAGNOSIS — E7800 Pure hypercholesterolemia, unspecified: Secondary | ICD-10-CM | POA: Diagnosis not present

## 2019-10-16 LAB — COMPREHENSIVE METABOLIC PANEL
ALT: 20 U/L (ref 0–35)
AST: 26 U/L (ref 0–37)
Albumin: 4.1 g/dL (ref 3.5–5.2)
Alkaline Phosphatase: 66 U/L (ref 39–117)
BUN: 28 mg/dL — ABNORMAL HIGH (ref 6–23)
CO2: 27 mEq/L (ref 19–32)
Calcium: 9.7 mg/dL (ref 8.4–10.5)
Chloride: 106 mEq/L (ref 96–112)
Creatinine, Ser: 0.74 mg/dL (ref 0.40–1.20)
GFR: 74.38 mL/min (ref >60.00)
Glucose, Bld: 82 mg/dL (ref 70–99)
Potassium: 4.7 mEq/L (ref 3.5–5.1)
Sodium: 139 mEq/L (ref 135–145)
Total Bilirubin: 0.8 mg/dL (ref 0.2–1.2)
Total Protein: 7 g/dL (ref 6.0–8.3)

## 2019-10-16 LAB — LIPID PANEL
Cholesterol: 160 mg/dL (ref 0–200)
HDL: 56.5 mg/dL (ref >39.00)
LDL Cholesterol: 89 mg/dL (ref 0–99)
NonHDL: 103.9
Total CHOL/HDL Ratio: 3
Triglycerides: 76 mg/dL (ref 0.0–149.0)
VLDL: 15.2 mg/dL (ref 0.0–40.0)

## 2019-10-16 LAB — CBC WITH DIFFERENTIAL/PLATELET
Basophils Absolute: 0.1 10*3/uL (ref 0.0–0.1)
Basophils Relative: 1.2 % (ref 0.0–3.0)
Eosinophils Absolute: 0.3 10*3/uL (ref 0.0–0.7)
Eosinophils Relative: 4.7 % (ref 0.0–5.0)
HCT: 35.5 % — ABNORMAL LOW (ref 36.0–46.0)
Hemoglobin: 11.8 g/dL — ABNORMAL LOW (ref 12.0–15.0)
Lymphocytes Relative: 14.7 % (ref 12.0–46.0)
Lymphs Abs: 0.8 10*3/uL (ref 0.7–4.0)
MCHC: 33.2 g/dL (ref 30.0–36.0)
MCV: 99.5 fl (ref 78.0–100.0)
Monocytes Absolute: 0.4 10*3/uL (ref 0.1–1.0)
Monocytes Relative: 7.8 % (ref 3.0–12.0)
Neutro Abs: 3.9 10*3/uL (ref 1.4–7.7)
Neutrophils Relative %: 71.6 % (ref 43.0–77.0)
Platelets: 227 10*3/uL (ref 150.0–400.0)
RBC: 3.57 Mil/uL — ABNORMAL LOW (ref 3.87–5.11)
RDW: 15.4 % (ref 11.5–15.5)
WBC: 5.4 10*3/uL (ref 4.0–10.5)

## 2019-10-16 LAB — IBC + FERRITIN
Ferritin: 25.8 ng/mL (ref 10.0–291.0)
Iron: 108 ug/dL (ref 42–145)
Saturation Ratios: 31.1 % (ref 20.0–50.0)
Transferrin: 248 mg/dL (ref 212.0–360.0)

## 2019-10-16 LAB — VITAMIN B12: Vitamin B-12: >1500 pg/mL — ABNORMAL HIGH (ref 211–911)

## 2019-10-16 NOTE — Progress Notes (Signed)
No critical labs need to be addressed urgently. We will discuss labs in detail at upcoming office visit.   

## 2019-10-16 NOTE — Patient Instructions (Signed)
[name] , Thank you for taking time to come for your Medicare Wellness Visit. I appreciate your ongoing commitment to your health goals. Please review the following plan we discussed and let me know if I can assist you in the future.   Screening recommendations/referrals: Colonoscopy: no longer required Mammogram: Up to date, completed 08/21/2019 Bone Density: Up to date, completed 08/15/2018 Recommended yearly ophthalmology/optometry visit for glaucoma screening and checkup Recommended yearly dental visit for hygiene and checkup  Vaccinations: Influenza vaccine: Up to date, completed 07/12/2019 Pneumococcal vaccine: Completed series Tdap vaccine: decline Shingles vaccine: discussed     Advanced directives: Please bring a copy of your POA (Power of Attorney) and/or Living Will to your next appointment.   Conditions/risks identified: hypercholesterolemia  Next appointment: 10/23/2019 @ 11:20 am    Preventive Care 65 Years and Older, Female Preventive care refers to lifestyle choices and visits with your health care provider that can promote health and wellness. What does preventive care include?  A yearly physical exam. This is also called an annual well check.  Dental exams once or twice a year.  Routine eye exams. Ask your health care provider how often you should have your eyes checked.  Personal lifestyle choices, including:  Daily care of your teeth and gums.  Regular physical activity.  Eating a healthy diet.  Avoiding tobacco and drug use.  Limiting alcohol use.  Practicing safe sex.  Taking low-dose aspirin every day.  Taking vitamin and mineral supplements as recommended by your health care provider. What happens during an annual well check? The services and screenings done by your health care provider during your annual well check will depend on your age, overall health, lifestyle risk factors, and family history of disease. Counseling  Your health care  provider may ask you questions about your:  Alcohol use.  Tobacco use.  Drug use.  Emotional well-being.  Home and relationship well-being.  Sexual activity.  Eating habits.  History of falls.  Memory and ability to understand (cognition).  Work and work Statistician.  Reproductive health. Screening  You may have the following tests or measurements:  Height, weight, and BMI.  Blood pressure.  Lipid and cholesterol levels. These may be checked every 5 years, or more frequently if you are over 58 years old.  Skin check.  Lung cancer screening. You may have this screening every year starting at age 66 if you have a 30-pack-year history of smoking and currently smoke or have quit within the past 15 years.  Fecal occult blood test (FOBT) of the stool. You may have this test every year starting at age 62.  Flexible sigmoidoscopy or colonoscopy. You may have a sigmoidoscopy every 5 years or a colonoscopy every 10 years starting at age 67.  Hepatitis C blood test.  Hepatitis B blood test.  Sexually transmitted disease (STD) testing.  Diabetes screening. This is done by checking your blood sugar (glucose) after you have not eaten for a while (fasting). You may have this done every 1-3 years.  Bone density scan. This is done to screen for osteoporosis. You may have this done starting at age 16.  Mammogram. This may be done every 1-2 years. Talk to your health care provider about how often you should have regular mammograms. Talk with your health care provider about your test results, treatment options, and if necessary, the need for more tests. Vaccines  Your health care provider may recommend certain vaccines, such as:  Influenza vaccine. This is recommended  every year.  Tetanus, diphtheria, and acellular pertussis (Tdap, Td) vaccine. You may need a Td booster every 10 years.  Zoster vaccine. You may need this after age 19.  Pneumococcal 13-valent conjugate (PCV13)  vaccine. One dose is recommended after age 57.  Pneumococcal polysaccharide (PPSV23) vaccine. One dose is recommended after age 23. Talk to your health care provider about which screenings and vaccines you need and how often you need them. This information is not intended to replace advice given to you by your health care provider. Make sure you discuss any questions you have with your health care provider. Document Released: 10/10/2015 Document Revised: 06/02/2016 Document Reviewed: 07/15/2015 Elsevier Interactive Patient Education  2017 [place] Prevention in the Home Falls can cause injuries. They can happen to people of all ages. There are many things you can do to make your home safe and to help prevent falls. What can I do on the outside of my home?  Regularly fix the edges of walkways and driveways and fix any cracks.  Remove anything that might make you trip as you walk through a door, such as a raised step or threshold.  Trim any bushes or trees on the path to your home.  Use bright outdoor lighting.  Clear any walking paths of anything that might make someone trip, such as rocks or tools.  Regularly check to see if handrails are loose or broken. Make sure that both sides of any steps have handrails.  Any raised decks and porches should have guardrails on the edges.  Have any leaves, snow, or ice cleared regularly.  Use sand or salt on walking paths during winter.  Clean up any spills in your garage right away. This includes oil or grease spills. What can I do in the bathroom?  Use night lights.  Install grab bars by the toilet and in the tub and shower. Do not use towel bars as grab bars.  Use non-skid mats or decals in the tub or shower.  If you need to sit down in the shower, use a plastic, non-slip stool.  Keep the floor dry. Clean up any water that spills on the floor as soon as it happens.  Remove soap buildup in the tub or shower  regularly.  Attach bath mats securely with double-sided non-slip rug tape.  Do not have throw rugs and other things on the floor that can make you trip. What can I do in the bedroom?  Use night lights.  Make sure that you have a light by your bed that is easy to reach.  Do not use any sheets or blankets that are too big for your bed. They should not hang down onto the floor.  Have a firm chair that has side arms. You can use this for support while you get dressed.  Do not have throw rugs and other things on the floor that can make you trip. What can I do in the kitchen?  Clean up any spills right away.  Avoid walking on wet floors.  Keep items that you use a lot in easy-to-reach places.  If you need to reach something above you, use a strong step stool that has a grab bar.  Keep electrical cords out of the way.  Do not use floor polish or wax that makes floors slippery. If you must use wax, use non-skid floor wax.  Do not have throw rugs and other things on the floor that can make you trip. What  can I do with my stairs?  Do not leave any items on the stairs.  Make sure that there are handrails on both sides of the stairs and use them. Fix handrails that are broken or loose. Make sure that handrails are as long as the stairways.  Check any carpeting to make sure that it is firmly attached to the stairs. Fix any carpet that is loose or worn.  Avoid having throw rugs at the top or bottom of the stairs. If you do have throw rugs, attach them to the floor with carpet tape.  Make sure that you have a light switch at the top of the stairs and the bottom of the stairs. If you do not have them, ask someone to add them for you. What else can I do to help prevent falls?  Wear shoes that:  Do not have high heels.  Have rubber bottoms.  Are comfortable and fit you well.  Are closed at the toe. Do not wear sandals.  If you use a stepladder:  Make sure that it is fully  opened. Do not climb a closed stepladder.  Make sure that both sides of the stepladder are locked into place.  Ask someone to hold it for you, if possible.  Clearly mark and make sure that you can see:  Any grab bars or handrails.  First and last steps.  Where the edge of each step is.  Use tools that help you move around (mobility aids) if they are needed. These include:  Canes.  Walkers.  Scooters.  Crutches.  Turn on the lights when you go into a dark area. Replace any light bulbs as soon as they burn out.  Set up your furniture so you have a clear path. Avoid moving your furniture around.  If any of your floors are uneven, fix them.  If there are any pets around you, be aware of where they are.  Review your medicines with your doctor. Some medicines can make you feel dizzy. This can increase your chance of falling. Ask your doctor what other things that you can do to help prevent falls. This information is not intended to replace advice given to you by your health care provider. Make sure you discuss any questions you have with your health care provider. Document Released: 07/10/2009 Document Revised: 02/19/2016 Document Reviewed: 10/18/2014 Elsevier Interactive Patient Education  2017 [employer].

## 2019-10-16 NOTE — Progress Notes (Signed)
Subjective:   Kathryn [name] is a 86 y.o. female who presents for Medicare Annual (Subsequent) preventive examination.  Review of Systems: N/A   This visit is being conducted through telemedicine via telephone at the nurse health advisor's home address due to the COVID-19 pandemic. This patient has given me verbal consent via doximity to conduct this visit, patient states they are participating from their home address. Patient and myself are on the telephone call. There is no referral for this visit. Some vital signs may be absent or patient reported.    Patient identification: identified by name, DOB, and current address   Cardiac Risk Factors include: advanced age (>71men, >64 women);Other (see comment), Risk factor comments: hypercholesterolemia     Objective:     Vitals: There were no vitals taken for this visit.  There is no height or weight on file to calculate BMI.  Advanced Directives 10/16/2019 10/09/2018 10/04/2017 10/13/2016 10/04/2016 07/13/2016  Does Patient Have a Medical Advance Directive? Yes Yes Yes Yes Yes Yes  Type of Paramedic of [place];Living will Living will;Healthcare Power of [place];Living will [place];Living will [place];Living will [place];Living will  Does patient want to make changes to medical advance directive? - - - No - Patient declined - No - Patient declined  Copy of [place] in Chart? No - copy requested No - copy requested No - copy requested No - copy requested No - copy requested No - copy requested    Tobacco Social History   Tobacco Use  Smoking Status Never Smoker  Smokeless Tobacco Never Used     Counseling given: Not Answered   Clinical Intake:  Pre-visit preparation completed: Yes  Pain : No/denies pain     Nutritional Risks: None Diabetes: No  How often do you need to have someone help you  when you read instructions, pamphlets, or other written materials from your doctor or pharmacy?: 1 - Never What is the last grade level you completed in school?: 12th  Interpreter Needed?: No  Information entered by :: CJohnson, LPN  Past Medical History:  Diagnosis Date  . Allergy   . Arthritis   . Colon polyps   . History of blood transfusion    with previous surgeries  . Hyperlipidemia   . Irregular heart rate   . PONV (postoperative nausea and vomiting)    hx of pt. states she thinks was from pain med.   Past Surgical History:  Procedure Laterality Date  . CARDIAC CATHETERIZATION    . HAND SURGERY Left   . TOTAL HIP ARTHROPLASTY  2007   LEFT  . TOTAL HIP ARTHROPLASTY Right 10/13/2016   Procedure: RIGHT TOTAL HIP ARTHROPLASTY ANTERIOR APPROACH;  Surgeon: Gaynelle Arabian, MD;  Location: [facility];  Service: Orthopedics;  Laterality: Right;  . TOTAL KNEE ARTHROPLASTY     Right   . TUBAL LIGATION     Family History  Problem Relation Age of Onset  . Pancreatic cancer Mother   . Hypertension Sister   . Heart disease Sister   . Breast cancer Sister        Age 7's  . Heart attack Brother   . Heart disease Brother   . Hypertension Brother   . Bone cancer Sister   . Heart disease Brother   . Colon cancer Neg Hx   . Colon polyps Neg Hx   . Esophageal cancer Neg Hx   .  Diabetes Neg Hx   . Kidney disease Neg Hx   . Gallbladder disease Neg Hx    Social History   Socioeconomic History  . Marital status: Married    Spouse name: Not on file  . Number of children: 1  . Years of education: Not on file  . Highest education level: Not on file  Occupational History  . Occupation: Chartered loss adjuster    Comment: Works at [employer]  . Smoking status: Never Smoker  . Smokeless tobacco: Never Used  Substance and Sexual Activity  . Alcohol use: Yes    Alcohol/week: 3.0 standard drinks    Types: 3 Standard drinks or equivalent per week    Comment: Occassionally  .  Drug use: No  . Sexual activity: Never    Birth control/protection: Post-menopausal, Surgical  Other Topics Concern  . Not on file  Social History Narrative   Regular exercise-- yes, 3-4 times a week      Diet: limited water, some fruit and veggies      Full code, has living will, HCPOA, son Loria Arehart ( reviewed 2015)   Social Determinants of Health   Financial Resource Strain: Low Risk   . Difficulty of Paying Living Expenses: Not hard at all  Food Insecurity: No Food Insecurity  . Worried About Charity fundraiser in the Last Year: Never true  . Ran Out of Food in the Last Year: Never true  Transportation Needs: No Transportation Needs  . Lack of Transportation (Medical): No  . Lack of Transportation (Non-Medical): No  Physical Activity: Inactive  . Days of Exercise per Week: 0 days  . Minutes of Exercise per Session: 0 min  Stress: No Stress Concern Present  . Feeling of Stress : Not at all  Social Connections:   . Frequency of Communication with Friends and Family: Not on file  . Frequency of Social Gatherings with Friends and Family: Not on file  . Attends Religious Services: Not on file  . Active Member of Clubs or Organizations: Not on file  . Attends Archivist Meetings: Not on file  . Marital Status: Not on file    Outpatient Encounter Medications as of 10/16/2019  Medication Sig  . cetirizine (ZYRTEC) 10 MG tablet TAKE 1 TABLET DAILY  . diclofenac (VOLTAREN) 75 MG EC tablet TAKE 1 TABLET TWICE A DAY  . simvastatin (ZOCOR) 40 MG tablet TAKE 1 TABLET AT BEDTIME  . predniSONE (DELTASONE) 20 MG tablet 2 tabs po for 4 days, then 1 tab po for 3 days (Patient not taking: Reported on 10/16/2019)   No facility-administered encounter medications on file as of 10/16/2019.    Activities of Daily Living In your present state of health, do you have any difficulty performing the following activities: 10/16/2019  Hearing? N  Vision? N  Difficulty concentrating or  making decisions? N  Walking or climbing stairs? N  Dressing or bathing? N  Doing errands, shopping? N  Preparing Food and eating ? N  Using the Toilet? N  In the past six months, have you accidently leaked urine? N  Do you have problems with loss of bowel control? N  Managing your Medications? N  Managing your Finances? N  Housekeeping or managing your Housekeeping? N  Some recent data might be hidden    Patient Care Team: Jinny Sanders, MD as PCP - General Marica Otter, OD as Referring Physician (Optometry)    Assessment:   This is a  routine wellness examination for [place].  Exercise Activities and Dietary recommendations Current Exercise Habits: The patient does not participate in regular exercise at present, Exercise limited by: None identified  Goals    . Increase physical activity     Starting 10/09/2018, I will continue to walk for 20 minutes twice weekly and to continue walking on my job at [employer]     . Patient Stated     10/16/2019, I will maintain and continue medications as prescribed.        Fall Risk Fall Risk  10/16/2019 10/09/2018 10/04/2017 07/13/2016 07/08/2015  Falls in the past year? 1 1 No No No  Comment tripped and fell at grocery store fell at work; injury to back head; treatment in ER - - -  Number falls in past yr: 0 0 - - -  Injury with Fall? 0 1 - - -  Follow up Falls evaluation completed;Falls prevention discussed - - - -   Is the patient's home free of loose throw rugs in walkways, pet beds, electrical cords, etc?   yes      Grab bars in the bathroom? yes      Handrails on the stairs?   yes      Adequate lighting?   yes  Timed Get Up and Go performed: N/A  Depression Screen PHQ 2/9 Scores 10/16/2019 10/09/2018 10/04/2017 07/13/2016  PHQ - 2 Score 0 0 0 0  PHQ- 9 Score 0 0 0 -     Cognitive Function MMSE - Mini Mental State Exam 10/16/2019 10/09/2018 10/04/2017  Orientation to time 5 5 5   Orientation to Place 5 5 5   Registration 3 3 3     Attention/ Calculation 5 0 0  Recall 3 3 3   Language- name 2 objects - 0 0  Language- repeat 1 1 1   Language- follow 3 step command - 3 3  Language- read & follow direction - 0 0  Write a sentence - 0 0  Copy design - 0 0  Total score - 20 20  Mini Cog  Mini-Cog screen was completed. Maximum score is 22. A value of 0 denotes this part of the MMSE was not completed or the patient failed this part of the Mini-Cog screening.       Immunization History  Administered Date(s) Administered  . Fluad Quad(high Dose 65+) 07/12/2019  . Influenza Split 08/29/2012  . Influenza Whole 06/27/2006, 07/14/2007, 06/18/2008, 08/05/2009, 06/23/2010  . Influenza,inj,Quad PF,6+ Mos 08/14/2013, 07/18/2014, 07/08/2015, 07/13/2016, 08/05/2017, 08/08/2018  . Influenza-Unspecified 07/13/2018  . Pneumococcal Conjugate-13 04/09/2014  . Pneumococcal Polysaccharide-23 09/27/2005  . Td 09/27/2004  . Zoster 06/18/2013    Qualifies for Shingles Vaccine? yes  Screening Tests Health Maintenance  Topic Date Due  . TETANUS/TDAP  10/15/2020 (Originally 09/27/2014)  . MAMMOGRAM  02/18/2020  . DEXA SCAN  08/15/2020  . INFLUENZA VACCINE  Completed  . PNA vac Low Risk Adult  Completed    Cancer Screenings: Lung: Low Dose CT Chest recommended if Age 47-80 years, 30 pack-year currently smoking OR have quit w/in 15years. Patient does not qualify. Breast:  Up to date on Mammogram? Yes, completed 08/21/2019   Up to date of Bone Density/Dexa? Yes, completed 08/15/2018 Colorectal: no longer required  Additional Screenings:  Hepatitis C Screening: N/A     Plan:    Patient will maintain and continue medications as prescribed.    I have personally reviewed and noted the following in the patient's chart:   . Medical and social  history . Use of alcohol, tobacco or illicit drugs  . Current medications and supplements . Functional ability and status . Nutritional status . Physical activity . Advanced  directives . List of other physicians . Hospitalizations, surgeries, and ER visits in previous 12 months . Vitals . Screenings to include cognitive, depression, and falls . Referrals and appointments  In addition, I have reviewed and discussed with patient certain preventive protocols, quality metrics, and best practice recommendations. A written personalized care plan for preventive services as well as general preventive health recommendations were provided to patient.     Andrez Grime, LPN  [id number]

## 2019-10-16 NOTE — Progress Notes (Signed)
PCP notes:  Health Maintenance: Tdap- decline   Abnormal Screenings: none   Patient concerns: nonenone   Nurse concerns:    Next PCP appt.: 10/23/2019 @ 11:20 am

## 2019-10-16 NOTE — Progress Notes (Signed)
Kathryn [name], Kathryn [name] ([id number]) Visit Report for 10/15/2019 SuperBill Details Patient Name: Date of Service: Kathryn [name], Kathryn [name] 10/15/2019 Medical Record [id number] Patient Account Number: [id number] Date of Birth/Sex: Treating RN: 1931-03-14 (86 y.o. [name] Primary Care Provider: Eliezer Lofts Other Clinician: Referring Provider: Treating Provider/Extender:Hyden Soley, [name], [name] in Treatment: 6 Diagnosis Coding ICD-10 Codes Code Description I87.303 Chronic venous hypertension (idiopathic) without complications of bilateral lower extremity S81.812D Laceration without foreign body, left lower leg, subsequent encounter L97.222 Non-pressure chronic ulcer of left calf with fat layer exposed L03.116 Cellulitis of left lower limb Facility Procedures CPT4 Code Description Modifier Quantity [id number] (Facility Use Only) [id number] - APPLY MULTLAY COMPRS LWR LT LEG 1 Electronic Signature(s) Signed: 10/15/2019 5:35:37 PM By: Baruch Gouty RN, BSN Signed: 10/16/2019 4:08:35 PM By: Linton Ham MD Entered By: Baruch Gouty on 10/15/2019 10:49:22

## 2019-10-17 ENCOUNTER — Other Ambulatory Visit: Payer: Self-pay | Admitting: Family Medicine

## 2019-10-17 NOTE — Telephone Encounter (Signed)
Last office visit 10/16/2019 with [name] for [place].  Last refilled 07/24/2018 for #180 with 2 refills.  CPE scheduled 10/23/2019.

## 2019-10-22 ENCOUNTER — Encounter (HOSPITAL_BASED_OUTPATIENT_CLINIC_OR_DEPARTMENT_OTHER): Payer: Medicare Other | Attending: Internal Medicine | Admitting: Internal Medicine

## 2019-10-22 ENCOUNTER — Other Ambulatory Visit: Payer: Self-pay

## 2019-10-22 DIAGNOSIS — I87312 Chronic venous hypertension (idiopathic) with ulcer of left lower extremity: Secondary | ICD-10-CM | POA: Diagnosis not present

## 2019-10-22 DIAGNOSIS — I872 Venous insufficiency (chronic) (peripheral): Secondary | ICD-10-CM | POA: Diagnosis not present

## 2019-10-22 DIAGNOSIS — L03116 Cellulitis of left lower limb: Secondary | ICD-10-CM | POA: Diagnosis not present

## 2019-10-22 DIAGNOSIS — L97222 Non-pressure chronic ulcer of left calf with fat layer exposed: Secondary | ICD-10-CM | POA: Diagnosis not present

## 2019-10-23 ENCOUNTER — Ambulatory Visit (INDEPENDENT_AMBULATORY_CARE_PROVIDER_SITE_OTHER): Payer: Medicare Other | Admitting: Family Medicine

## 2019-10-23 ENCOUNTER — Encounter: Payer: Self-pay | Admitting: Family Medicine

## 2019-10-23 ENCOUNTER — Other Ambulatory Visit: Payer: Self-pay

## 2019-10-23 VITALS — BP 90/62 | HR 79 | Temp 97.4°F | Ht 65.0 in | Wt 119.5 lb

## 2019-10-23 DIAGNOSIS — Z0000 Encounter for general adult medical examination without abnormal findings: Secondary | ICD-10-CM

## 2019-10-23 DIAGNOSIS — E538 Deficiency of other specified B group vitamins: Secondary | ICD-10-CM | POA: Diagnosis not present

## 2019-10-23 DIAGNOSIS — M19042 Primary osteoarthritis, left hand: Secondary | ICD-10-CM

## 2019-10-23 DIAGNOSIS — D649 Anemia, unspecified: Secondary | ICD-10-CM

## 2019-10-23 DIAGNOSIS — M19041 Primary osteoarthritis, right hand: Secondary | ICD-10-CM

## 2019-10-23 DIAGNOSIS — M810 Age-related osteoporosis without current pathological fracture: Secondary | ICD-10-CM | POA: Diagnosis not present

## 2019-10-23 DIAGNOSIS — E7800 Pure hypercholesterolemia, unspecified: Secondary | ICD-10-CM

## 2019-10-23 NOTE — Assessment & Plan Note (Signed)
Stable in past years... not interested in treatment.

## 2019-10-23 NOTE — Assessment & Plan Note (Signed)
Well controlled. Continue current medication. Pt wishes to continue despite age given healthy.

## 2019-10-23 NOTE — Assessment & Plan Note (Signed)
stable with Hg at 11.8.Marland Kitchen iron in nml range.

## 2019-10-23 NOTE — Assessment & Plan Note (Addendum)
Very bothersome for pt. Pt understands risk of diclofenac at her age. Will try to limit/stop. USe tylenol instead.

## 2019-10-23 NOTE — Assessment & Plan Note (Signed)
Resolved on supplement 

## 2019-10-23 NOTE — Patient Instructions (Addendum)
Work on increaing protein in diet for healing.  Try to use tylenol for pain  In joints... limit or stop diclofenac given increase risk after age 84.

## 2019-10-23 NOTE — Progress Notes (Signed)
Kathryn [name], Kathryn [name] ([id number]) Visit Report for 10/22/2019 Cellular or Tissue Based Product Details Patient Name: Date of Service: Kathryn [name], Kathryn [name] 10/22/2019 10:15 AM Medical Record [id number] Patient Account Number: [id number] Date of Birth/Sex: Treating RN: December 26, 1930 (86 y.o. [name] Primary Care Provider: Eliezer Lofts Other Clinician: Referring Provider: Treating Provider/Extender:Deanta Mincey, [name], [name] in Treatment: 7 Cellular or Tissue Based Wound #1 Left,Medial Lower Leg Product Type Applied to: Performed By: Physician Ricard Dillon., MD Cellular or Tissue Based Apligraf Product Type: Level of Consciousness (Pre- Awake and Alert procedure): Pre-procedure Yes - 11:28 Verification/Time Out Taken: Location: trunk / arms / legs Wound Size (sq cm): 13.34 Product Size (sq cm): 44 Waste Size (sq cm): 0 Amount of Product Applied (sq cm): 44 Lot #: [id number] Order #: 2 Expiration Date: 10/25/2019 Fenestrated: Yes Instrument: Blade Reconstituted: Yes Solution Type: normal saline Solution Amount: 10 ml Lot #[id number] Solution Expiration Date: 10/25/2019 Secured: No Dressing Applied: Yes Primary Dressing: gauze, carboflex, abd Procedural Pain: 0 Post Procedural Pain: 0 Response to Treatment: Procedure was tolerated well Level of Consciousness Awake and Alert (Post-procedure): Post Procedure Diagnosis Same as Pre-procedure Electronic Signature(s) Signed: 10/22/2019 6:46:58 PM By: Levan Hurst RN, BSN Signed: 10/23/2019 5:32:11 PM By: Linton Ham MD Entered By: Levan Hurst on 10/22/2019 11:31:15 -------------------------------------------------------------------------------- HPI Details Patient Name: Date of Service: Kathryn [name] 10/22/2019 10:15 AM Medical Record [id number] Patient Account Number: [id number] Date of Birth/Sex: Treating RN: August 30, 1931 (85 y.o. F) Primary Care Provider: Eliezer Lofts Other  Clinician: Referring Provider: Treating Provider/Extender:Aslyn Cottman, [name], [name] in Treatment: 7 History of Present Illness HPI Description: ADMISSION 08/28/19 This is an 86 year old woman who is still very active still working full-time at [employer] Sometime in late September her left calf was hit by the harness of her dog who was chasing another person. She was left with a fairly clear skin tear she tried to pull the skin back over the wound but it it did not maintain. She was seen in her primary doctor's office on 07/20/2019 she was given a topical antibiotic and cephalexin. She was reviewed in primary care on 11/3 and it was felt the wound might need debridement and she was sent here. The patient has chronic venous insufficiency however she does not have a history of recurrent chronic wounds. Past medical history includes cellulitis of the left foot, hyperlipidemia, irregular heart rate, colonic polyps., Low back pain, B12 deficiency, Dupuytren's contractures, recurrent UTIs she has a history of bilateral hip surgery for osteoarthritis ABI in our clinic was 0.94 on the left 12/8; deep laceration wound on the left medial mid tibia. We are using Iodoflex under compression. She is not eligible for home health 12/15; deep laceration wound on the left mid tibia. We are using Iodoflex under compression. Perhaps some minimal improvement in wound surface certainly no improvement in surface area 12/18; the patient called in to report drainage coming out of the wound and increasing pain. She was brought in for a nurse visit. Noted to have serosanguineous drainage and I was asked to look at this. 12/21; 3-day follow-up. Culture I did on 12/18 grew multiple organisms but none predominated. In spite of this the doxycycline really seems to have helped there is much less surrounding erythema. We allowed her to change the dressing over the weekend. The patient still works at [employer]  There is edema around the wound because we did not wrap her last time. I wanted her to be able to continue to  monitor the degree of erythema 12/28; the patient is not working and telling me she is staying off her foot is much as possible. She was approved for Apligraf but there was still too much debris over the wound surface today to consider this. Extensive debridement change dressing to Iodoflex 10/01/2019. The patient's wound is in a much better surface this week. We will order the Apligraf but continue Iodoflex for this week 1/11; healthy looking wound surface. Apligraf #1. No evidence of infection 1/25; wound down 1 cm in width and length. Apligraf #2 Electronic Signature(s) Signed: 10/23/2019 5:32:11 PM By: Linton Ham MD Entered By: Linton Ham on 10/22/2019 11:31:29 -------------------------------------------------------------------------------- Physical Exam Details Patient Name: Date of Service: Kathryn [name] 10/22/2019 10:15 AM Medical Record [id number] Patient Account Number: [id number] Date of Birth/Sex: Treating RN: 06/16/1931 (86 y.o. F) Primary Care Provider: Eliezer Lofts Other Clinician: Referring Provider: Treating Provider/Extender:Margaretann Abate, [name], [name] in Treatment: 7 Notes Wound exam; continued improvement in the surface of the wound nice improvement in the surface area. Apligraf #2 applied in the standard fashion Electronic Signature(s) Signed: 10/23/2019 5:32:11 PM By: Linton Ham MD Entered By: Linton Ham on 10/22/2019 11:32:03 -------------------------------------------------------------------------------- Physician Orders Details Patient Name: Date of Service: Kathryn [name] 10/22/2019 10:15 AM Medical Record [id number] Patient Account Number: [id number] Date of Birth/Sex: Treating RN: 04-14-1931 (86 y.o. [name] Primary Care Provider: Eliezer Lofts Other Clinician: Referring Provider: Treating  Provider/Extender:Clerence Gubser, [name], [name] in Treatment: 7 Verbal / Phone Orders: No Diagnosis Coding ICD-10 Coding Code Description I87.303 Chronic venous hypertension (idiopathic) without complications of bilateral lower extremity S81.812D Laceration without foreign body, left lower leg, subsequent encounter L97.222 Non-pressure chronic ulcer of left calf with fat layer exposed L03.116 Cellulitis of left lower limb Follow-up Appointments Return Appointment in 2 weeks. - MD Nurse Visit: - 1 week for rewrap Dressing Change Frequency Wound #1 Left,Medial Lower Leg Do not change entire dressing for one week. Skin Barriers/Peri-Wound Care Wound #1 Left,Medial Lower Leg Moisturizing lotion - to leg Wound Cleansing Wound #1 Left,Medial Lower Leg May shower with protection. Primary Wound Dressing Wound #1 Left,Medial Lower Leg Skin Substitute Application - Apligraf #2 Secondary Dressing Wound #1 Left,Medial Lower Leg Adaptic Dressing - secure with steri strips Dry Gauze ABD pad Carboflex Edema Control 3 Layer Compression System - Left Lower Extremity Avoid standing for long periods of time Elevate legs to the level of the heart or above for 30 minutes daily and/or when sitting, a frequency of: - throughout the day Exercise regularly Electronic Signature(s) Signed: 10/22/2019 6:46:58 PM By: Levan Hurst RN, BSN Signed: 10/23/2019 5:32:11 PM By: Linton Ham MD Entered By: Levan Hurst on 10/22/2019 11:27:36 -------------------------------------------------------------------------------- Problem List Details Patient Name: Date of Service: Kathryn [name] 10/22/2019 10:15 AM Medical Record [id number] Patient Account Number: [id number] Date of Birth/Sex: Treating RN: December 23, 1930 (86 y.o. [name] Primary Care Provider: Eliezer Lofts Other Clinician: Referring Provider: Treating Provider/Extender:Mirranda Monrroy, [name], [name] in Treatment:  7 Active Problems ICD-10 Evaluated Encounter Code Description Active Date Today Diagnosis I87.303 Chronic venous hypertension (idiopathic) without 99991111 No Yes complications of bilateral lower extremity S81.812D Laceration without foreign body, left lower leg, 08/28/2019 No Yes subsequent encounter L97.222 Non-pressure chronic ulcer of left calf with fat layer 08/28/2019 No Yes exposed L03.116 Cellulitis of left lower limb 09/14/2019 No Yes Inactive Problems Resolved Problems Electronic Signature(s) Signed: 10/23/2019 5:32:11 PM By: Linton Ham MD Entered By: Linton Ham on 10/22/2019 11:30:48 -------------------------------------------------------------------------------- Progress Note Details Patient  Name: Date of Service: Kathryn [name], Kathryn [name] 10/22/2019 10:15 AM Medical Record [id number] Patient Account Number: [id number] Date of Birth/Sex: Treating RN: Jun 17, 1931 (86 y.o. F) Primary Care Provider: Eliezer Lofts Other Clinician: Referring Provider: Treating Provider/Extender:Cordell Coke, [name], [name] in Treatment: 7 Subjective History of Present Illness (HPI) ADMISSION 08/28/19 This is an 86 year old woman who is still very active still working full-time at [employer] Sometime in late September her left calf was hit by the harness of her dog who was chasing another person. She was left with a fairly clear skin tear she tried to pull the skin back over the wound but it it did not maintain. She was seen in her primary doctor's office on 07/20/2019 she was given a topical antibiotic and cephalexin. She was reviewed in primary care on 11/3 and it was felt the wound might need debridement and she was sent here. The patient has chronic venous insufficiency however she does not have a history of recurrent chronic wounds. Past medical history includes cellulitis of the left foot, hyperlipidemia, irregular heart rate, colonic polyps., Low back pain, B12 deficiency,  Dupuytren's contractures, recurrent UTIs she has a history of bilateral hip surgery for osteoarthritis ABI in our clinic was 0.94 on the left 12/8; deep laceration wound on the left medial mid tibia. We are using Iodoflex under compression. She is not eligible for home health 12/15; deep laceration wound on the left mid tibia. We are using Iodoflex under compression. Perhaps some minimal improvement in wound surface certainly no improvement in surface area 12/18; the patient called in to report drainage coming out of the wound and increasing pain. She was brought in for a nurse visit. Noted to have serosanguineous drainage and I was asked to look at this. 12/21; 3-day follow-up. Culture I did on 12/18 grew multiple organisms but none predominated. In spite of this the doxycycline really seems to have helped there is much less surrounding erythema. We allowed her to change the dressing over the weekend. The patient still works at [employer] There is edema around the wound because we did not wrap her last time. I wanted her to be able to continue to monitor the degree of erythema 12/28; the patient is not working and telling me she is staying off her foot is much as possible. She was approved for Apligraf but there was still too much debris over the wound surface today to consider this. Extensive debridement change dressing to Iodoflex 10/01/2019. The patient's wound is in a much better surface this week. We will order the Apligraf but continue Iodoflex for this week 1/11; healthy looking wound surface. Apligraf #1. No evidence of infection 1/25; wound down 1 cm in width and length. Apligraf #2 Objective Constitutional Vitals Time Taken: 10:25 AM, Height: 67 in, Weight: 125 lbs, BMI: 19.6, Temperature: 97.8 F, Pulse: 77 bpm, Respiratory Rate: 18 breaths/min, Blood Pressure: 130/87 mmHg. Integumentary (Hair, Skin) Wound #1 status is Open. Original cause of wound was Trauma. The wound is  located on the Left,Medial Lower Leg. The wound measures 5.8cm length x 2.3cm width x 0.2cm depth; 10.477cm^2 area and 2.095cm^3 volume. There is Fat Layer (Subcutaneous Tissue) Exposed exposed. There is no tunneling or undermining noted. There is a large amount of serosanguineous drainage noted. The wound margin is well defined and not attached to the wound base. There is medium (34-66%) red, hyper - granulation within the wound bed. There is a small (1-33%) amount of necrotic tissue within the wound bed including Adherent Slough.  Assessment Active Problems ICD-10 Chronic venous hypertension (idiopathic) without complications of bilateral lower extremity Laceration without foreign body, left lower leg, subsequent encounter Non-pressure chronic ulcer of left calf with fat layer exposed Cellulitis of left lower limb Procedures Wound #1 Pre-procedure diagnosis of Wound #1 is a Venous Leg Ulcer located on the Left,Medial Lower Leg. A skin graft procedure using a bioengineered skin substitute/cellular or tissue based product was performed by Ricard Dillon., MD. Apligraf was applied and was not secured. 44 sq cm of product was utilized and 0 sq cm was wasted. Post Application, gauze, carboflex, abd was applied. A Time Out was conducted at 11:28, prior to the start of the procedure. The procedure was tolerated well with a pain level of 0 throughout and a pain level of 0 following the procedure. Post procedure Diagnosis Wound #1: Same as Pre-Procedure . Pre-procedure diagnosis of Wound #1 is a Venous Leg Ulcer located on the Left,Medial Lower Leg . There was a Three Layer Compression Therapy Procedure by Levan Hurst, RN. Post procedure Diagnosis Wound #1: Same as Pre-Procedure Plan Follow-up Appointments: Return Appointment in 2 weeks. - MD Nurse Visit: - 1 week for rewrap Dressing Change Frequency: Wound #1 Left,Medial Lower Leg: Do not change entire dressing for one week. Skin  Barriers/Peri-Wound Care: Wound #1 Left,Medial Lower Leg: Moisturizing lotion - to leg Wound Cleansing: Wound #1 Left,Medial Lower Leg: May shower with protection. Primary Wound Dressing: Wound #1 Left,Medial Lower Leg: Skin Substitute Application - Apligraf #2 Secondary Dressing: Wound #1 Left,Medial Lower Leg: Adaptic Dressing - secure with steri strips Dry Gauze ABD pad Carboflex Edema Control: 3 Layer Compression System - Left Lower Extremity Avoid standing for long periods of time Elevate legs to the level of the heart or above for 30 minutes daily and/or when sitting, a frequency of: - throughout the day Exercise regularly Improvement in the wound 2. Apligraf #2 applied Electronic Signature(s) Signed: 10/23/2019 5:32:11 PM By: Linton Ham MD Entered By: Linton Ham on 10/22/2019 11:32:29 -------------------------------------------------------------------------------- SuperBill Details Patient Name: Date of Service: Kathryn [name] 10/22/2019 Medical Record [id number] Patient Account Number: [id number] Date of Birth/Sex: Treating RN: [birth year] (86 y.o. F) Primary Care Provider: Eliezer Lofts Other Clinician: Referring Provider: Treating Provider/Extender:Jackie Russman, [name], [name] in Treatment: 7 Diagnosis Coding ICD-10 Codes Code Description I87.303 Chronic venous hypertension (idiopathic) without complications of bilateral lower extremity S81.812D Laceration without foreign body, left lower leg, subsequent encounter L97.222 Non-pressure chronic ulcer of left calf with fat layer exposed L03.116 Cellulitis of left lower limb Facility Procedures CPT4 Code Description: [id number] (Facility Use Only) Apligraf 1 SQ CM Modifier: Quantity: 48 CPT4 Code Description: [id number] - SKIN SUB GRAFT TRNK/ARM/LEG ICD-10 Diagnosis Description I87.303 Chronic venous hypertension (idiopathic) without complicati extremity 123456 Non-pressure chronic ulcer of  left calf with fat layer expo Modifier: ons of bilatera sed Quantity: 1 l lower Physician Procedures CPT4: Code [id number] Description: [id number] - WC PHYS SKIN SUB GRAFT TRNK/ARM/LEG ICD-10 Diagnosis Description I87.303 Chronic venous hypertension (idiopathic) without complicatio extremity 123456 Non-pressure chronic ulcer of left calf with fat layer expos Modifier: ns of bilatera ed Quantity: 1 l lower Electronic Signature(s) Signed: 10/22/2019 6:46:58 PM By: Levan Hurst RN, BSN Signed: 10/23/2019 5:32:11 PM By: Linton Ham MD Entered By: Levan Hurst on 10/22/2019 18:11:31

## 2019-10-23 NOTE — Progress Notes (Addendum)
Chief Complaint  Patient presents with  . Annual Exam    Part 2    History of Present Illness: HPI  The patient presents for complete physical and review of chronic health problems. He/She also has the following acute concerns today:  The patient saw a LPN or RN for medicare wellness visit.  Prevention and wellness was reviewed in detail. Note reviewed and important notes copied below.  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  Left  leg wound followed at wound care center.. now has apligraf in place.  no peripheral edema  Elevated Cholesterol:  LDL at goal on statin.. pt wishes to continue despite age as no SE  Lab Results  Component Value Date   CHOL 160 10/16/2019   HDL 56.50 10/16/2019   LDLCALC 89 10/16/2019   LDLDIRECT 116.9 09/25/2008   TRIG 76.0 10/16/2019   CHOLHDL 3 10/16/2019  Using medications without problems: Muscle aches: none Diet compliance: healthy Exercise: minimal given leg issue Other complaints:  Moderate appetite Wt Readings from Last 3 Encounters:  10/23/19 119 lb 8 oz (54.2 kg)  08/13/19 123 lb 4 oz (55.9 kg)  07/31/19 123 lb 12 oz (56.1 kg)    B12 def treated and at goal  Anemia stable with Hg at 11.8.Marland Kitchen iron in nml range. OA in feet and hands: Using diclofenac twice daily , no SE. She understands increased risk of CAD on NSAIDS especially at her age. Will try to limit/stop. USe tylenol instead.  This visit occurred during the SARS-CoV-2 public health emergency.  Safety protocols were in place, including screening questions prior to the visit, additional usage of staff PPE, and extensive cleaning of exam room while observing appropriate contact time as indicated for disinfecting solutions.   COVID 19 screen:  No recent travel or known exposure to COVID19 The patient denies respiratory symptoms of  COVID 19 at this time. The importance of social distancing was discussed today.     Review of Systems  Constitutional: Negative for chills and fever.  HENT: Negative for congestion and ear pain.   Eyes: Negative for pain and redness.  Respiratory: Negative for cough and shortness of breath.   Cardiovascular: Negative for chest pain, palpitations and leg swelling.  Gastrointestinal: Negative for abdominal pain, blood in stool, constipation, diarrhea, nausea and vomiting.  Genitourinary: Negative for dysuria.  Musculoskeletal: Negative for falls and myalgias.  Skin: Negative for rash.  Neurological: Negative for dizziness.  Psychiatric/Behavioral: Negative for depression. The patient is not nervous/anxious.       Past Medical History:  Diagnosis Date  . Allergy   . Arthritis   . Colon polyps   . History of blood transfusion    with previous surgeries  . Hyperlipidemia   . Irregular heart rate   . PONV (postoperative nausea and vomiting)    hx of pt. states she thinks was from pain med.    reports that she has never smoked. She has never used smokeless tobacco. She reports current alcohol use of about 3.0 standard drinks of alcohol per week. She reports that she does not use drugs.   Current Outpatient Medications:  .  Calcium Carbonate-Vit D-Min (CALCIUM 1200 PO), Take 2 tablets by mouth daily., Disp: , Rfl:  .  cetirizine (ZYRTEC) 10 MG tablet, TAKE 1 TABLET DAILY, Disp: 90 tablet, Rfl: 3 .  Cyanocobalamin (VITAMIN B-12  PO), Take 1 tablet by mouth daily., Disp: , Rfl:  .  simvastatin (ZOCOR) 40 MG tablet, TAKE 1 TABLET AT BEDTIME, Disp: 90 tablet, Rfl: 3   Observations/Objective: Blood pressure 90/62, pulse 79, temperature (!) 97.4 F (36.3 C), temperature source Temporal, height 5\' 5"  (1.651 m), weight 119 lb 8 oz (54.2 kg), SpO2 99 %.  Physical Exam Constitutional:      General: She is not in acute distress.    Appearance: Normal appearance. She is well-developed. She  is not ill-appearing or toxic-appearing.  HENT:     Head: Normocephalic.     Right Ear: Hearing, tympanic membrane, ear canal and external ear normal. Tympanic membrane is not erythematous, retracted or bulging.     Left Ear: Hearing, tympanic membrane, ear canal and external ear normal. Tympanic membrane is not erythematous, retracted or bulging.     Nose: Nose normal. No mucosal edema or rhinorrhea.     Right Sinus: No maxillary sinus tenderness or frontal sinus tenderness.     Left Sinus: No maxillary sinus tenderness or frontal sinus tenderness.     Mouth/Throat:     Pharynx: Uvula midline.  Eyes:     General: Lids are normal. Lids are everted, no foreign bodies appreciated.     Conjunctiva/sclera: Conjunctivae normal.     Pupils: Pupils are equal, round, and reactive to light.  Neck:     Thyroid: No thyroid mass or thyromegaly.     Vascular: No carotid bruit.     Trachea: Trachea normal.  Cardiovascular:     Rate and Rhythm: Normal rate and regular rhythm.     Pulses: Normal pulses.     Heart sounds: Normal heart sounds, S1 normal and S2 normal. No murmur. No friction rub. No gallop.   Pulmonary:     Effort: Pulmonary effort is normal. No tachypnea or respiratory distress.     Breath sounds: Normal breath sounds. No decreased breath sounds, wheezing, rhonchi or rales.  Abdominal:     General: Bowel sounds are normal. There is no distension or abdominal bruit.     Palpations: Abdomen is soft. There is no fluid wave or mass.     Tenderness: There is no abdominal tenderness. There is no guarding or rebound.     Hernia: No hernia is present.  Musculoskeletal:     Right hand: Deformity, tenderness and bony tenderness present. No swelling. Decreased range of motion.     Left hand: Deformity, tenderness and bony tenderness present. No swelling. Decreased range of motion.     Cervical back: Normal range of motion and neck supple.     Right foot: Deformity present.     Left foot:  Deformity present.  Lymphadenopathy:     Cervical: No cervical adenopathy.  Skin:    General: Skin is warm and dry.     Findings: No rash.  Neurological:     Mental Status: She is alert.     Cranial Nerves: No cranial nerve deficit.     Sensory: No sensory deficit.  Psychiatric:        Mood and Affect: Mood is not anxious or depressed.        Speech: Speech normal.        Behavior: Behavior normal. Behavior is cooperative.        Thought Content: Thought content normal.        Judgment: Judgment normal.      Assessment and Plan The patient's preventative maintenance and recommended screening tests  for an annual wellness exam were reviewed in full today. Brought up to date unless services declined.  Counselled on the importance of diet, exercise, and its role in overall health and mortality. The patient's FH and SH was reviewed, including their home life, tobacco status, and drug and alcohol status.   Nonsmoker  Vaccines Uptodateflu, PNA, refused td. Colon: Dr. Henrene Pastor, 2 polyps 09/2008,no further indicated due to age. DEXA:2019 osteopenia in spine, osteoporosis in forearm.. Pt refuses medication to treat.  Refuses further eval. Mammogram:11/2020nml, no further indicated, but she request to continue given healthy. DVE/PAP: no indication due to age   Anemia  stable with Hg at 11.8.Marland Kitchen iron in nml range.  B12 deficiency Resolved on supplement  Elevated cholesterol Well controlled. Continue current medication. Pt wishes to continue despite age given healthy.  Osteoporosis Stable in past years... not interested in treatment.  Osteoarthritis, hand  Very bothersome for pt. Pt understands risk of diclofenac at her age. Will try to limit/stop. USe tylenol instead.     Eliezer Lofts, MD

## 2019-10-25 NOTE — Progress Notes (Signed)
Kathryn [name], Kathryn [name] ([id number]) Visit Report for 10/22/2019 Arrival Information Details Patient Name: Date of Service: Kathryn [name], Kathryn [name] 10/22/2019 10:15 AM Medical Record [id number] Patient Account Number: [id number] Date of Birth/Sex: Treating RN: 10/05/1930 (86 y.o. Kathryn [name] Primary Care Aedyn Kempfer: Eliezer Lofts Other Clinician: Referring Kritika Stukes: Treating Roselee Tayloe/Extender:Robson, [name], [name] in Treatment: 7 Visit Information History Since Last Visit Added or deleted any medications: No Patient Arrived: Ambulatory Any new allergies or adverse reactions: No Arrival Time: 10:28 Had a fall or experienced change in No Accompanied By: self activities of daily living that may affect Transfer Assistance: None risk of falls: Patient Identification Verified: Yes Signs or symptoms of abuse/neglect since last No Secondary Verification Process Completed: Yes visito Patient Requires Transmission-Based No Hospitalized since last visit: No Precautions: Implantable device outside of the clinic excluding No Patient Has Alerts: No cellular tissue based products placed in the center since last visit: Has Dressing in Place as Prescribed: Yes Has Compression in Place as Prescribed: Yes Pain Present Now: No Electronic Signature(s) Signed: 10/25/2019 7:44:20 AM By: Kela Millin Entered By: Kela Millin on 10/22/2019 10:28:52 -------------------------------------------------------------------------------- Compression Therapy Details Patient Name: Date of Service: Kathryn [name] 10/22/2019 10:15 AM Medical Record [id number] Patient Account Number: [id number] Date of Birth/Sex: Treating RN: 1931-01-18 (86 y.o. Kathryn [name] Primary Care Lauralie Blacksher: Eliezer Lofts Other Clinician: Referring Suman Trivedi: Treating Shari Natt/Extender:Robson, [name], [name] in Treatment: 7 Compression Therapy Performed for Wound Wound #1 Left,Medial Lower  Leg Assessment: Performed By: Clinician Levan Hurst, RN Compression Type: Three Layer Post Procedure Diagnosis Same as Pre-procedure Electronic Signature(s) Signed: 10/22/2019 6:46:58 PM By: Levan Hurst RN, BSN Entered By: Levan Hurst on 10/22/2019 11:31:41 -------------------------------------------------------------------------------- Encounter Discharge Information Details Patient Name: Date of Service: Kathryn [name] 10/22/2019 10:15 AM Medical Record [id number] Patient Account Number: [id number] Date of Birth/Sex: Treating RN: November 18, 1930 (86 y.o. Kathryn [name] Primary Care Annina Piotrowski: Eliezer Lofts Other Clinician: Referring Raevyn Sokol: Treating Sion Reinders/Extender:Robson, [name], [name] in Treatment: 7 Encounter Discharge Information Items Post Procedure Vitals Discharge Condition: Stable Temperature (F): 97.8 Ambulatory Status: Ambulatory Pulse (bpm): 77 Discharge Destination: Home Respiratory Rate (breaths/min): 18 Transportation: Private Auto Blood Pressure (mmHg): 130/87 Accompanied By: self Schedule Follow-up Appointment: Yes Clinical Summary of Care: Electronic Signature(s) Signed: 10/22/2019 5:49:19 PM By: Deon Pilling Entered By: Deon Pilling on 10/22/2019 11:51:47 -------------------------------------------------------------------------------- Lower Extremity Assessment Details Patient Name: Date of Service: Kathryn [name] 10/22/2019 10:15 AM Medical Record [id number] Patient Account Number: [id number] Date of Birth/Sex: Treating RN: 03-Oct-1930 (86 y.o. Kathryn [name] Primary Care Carry Weesner: Eliezer Lofts Other Clinician: Referring Phoenix Riesen: Treating Jeriyah Granlund/Extender:Robson, [name], [name] in Treatment: 7 Edema Assessment Assessed: [Left: No] [Right: No] Edema: [Left: N] [Right: o] Calf Left: Right: Point of Measurement: cm From Medial Instep 30 cm cm Ankle Left: Right: Point of Measurement: cm From  Medial Instep 22.4 cm cm Vascular Assessment Pulses: Dorsalis Pedis Palpable: [Left:Yes] Electronic Signature(s) Signed: 10/25/2019 7:44:20 AM By: Kela Millin Entered By: Kela Millin on 10/22/2019 10:36:14 -------------------------------------------------------------------------------- Multi Wound Chart Details Patient Name: Date of Service: Kathryn [name] 10/22/2019 10:15 AM Medical Record [id number] Patient Account Number: [id number] Date of Birth/Sex: Treating RN: [birth year] (86 y.o. F) Primary Care Kyrra Prada: Eliezer Lofts Other Clinician: Referring Jquan Egelston: Treating Veera Stapleton/Extender:Robson, [name], [name] in Treatment: 7 Vital Signs Height(in): 67 Pulse(bpm): 77 Weight(lbs): 125 Blood Pressure(mmHg): 130/87 Body Mass Index(BMI): 20 Temperature(F): 97.8 Respiratory 18 Rate(breaths/min): Photos: [1:No Photos] [N/A:N/A] Wound Location: [1:Left Lower Leg - Medial] [N/A:N/A] Wounding Event: [1:Trauma] [  N/A:N/A] Primary Etiology: [1:Venous Leg Ulcer] [N/A:N/A] Comorbid History: [1:Cataracts, Osteoarthritis, Confinement Anxiety] [N/A:N/A] Date Acquired: [1:05/29/2019] [N/A:N/A] Weeks of Treatment: [1:7] [N/A:N/A] Wound Status: [1:Open] [N/A:N/A] Measurements L x W x D 5.8x2.3x0.2 [N/A:N/A] (cm) Area (cm) : [1:10.477] [N/A:N/A] Volume (cm) : [1:2.095] [N/A:N/A] % Reduction in Area: [1:-4.90%] [N/A:N/A] % Reduction in Volume: 30.10% [N/A:N/A] Classification: [1:Full Thickness Without Exposed Support Structures] [N/A:N/A] Exudate Amount: [1:Large] [N/A:N/A] Exudate Type: [1:Serosanguineous] [N/A:N/A] Exudate Color: [1:red, brown] [N/A:N/A] Wound Margin: [1:Well defined, not attached N/A] Granulation Amount: [1:Medium (34-66%)] [N/A:N/A] Granulation Quality: [1:Red, Hyper-granulation] [N/A:N/A] Necrotic Amount: [1:Small (1-33%)] [N/A:N/A] Exposed Structures: [1:Fat Layer (Subcutaneous N/A Tissue) Exposed: Yes Fascia: No Tendon: No Muscle:  No Joint: No Bone: No Small (1-33%)] [N/A:N/A] Treatment Notes Electronic Signature(s) Signed: 10/23/2019 5:32:11 PM By: Linton Ham MD Entered By: Linton Ham on 10/22/2019 11:31:02 -------------------------------------------------------------------------------- Multi-Disciplinary Care Plan Details Patient Name: Date of Service: Kathryn [name] 10/22/2019 10:15 AM Medical Record [id number] Patient Account Number: [id number] Date of Birth/Sex: Treating RN: June 30, 1931 (86 y.o. Kathryn [name] Primary Care Tyra Gural: Eliezer Lofts Other Clinician: Referring Cecil Bixby: Treating Lorrin Nawrot/Extender:Robson, [name], [name] in Treatment: 7 Active Inactive Wound/Skin Impairment Nursing Diagnoses: Knowledge deficit related to ulceration/compromised skin integrity Goals: Patient/caregiver will verbalize understanding of skin care regimen Date Initiated: 08/28/2019 Target Resolution Date: 11/02/2019 Goal Status: Active Ulcer/skin breakdown will have a volume reduction of 30% by week 4 Date Initiated: 08/28/2019 Date Inactivated: 10/01/2019 Target Resolution Date: 10/05/2019 Goal Status: Met Ulcer/skin breakdown will have a volume reduction of 50% by week 8 Date Initiated: 10/01/2019 Target Resolution Date: 11/02/2019 Goal Status: Active Interventions: Assess patient/caregiver ability to obtain necessary supplies Assess patient/caregiver ability to perform ulcer/skin care regimen upon admission and as needed Assess ulceration(s) every visit Notes: Electronic Signature(s) Signed: 10/22/2019 6:46:58 PM By: Levan Hurst RN, BSN Entered By: Levan Hurst on 10/22/2019 18:10:46 -------------------------------------------------------------------------------- Pain Assessment Details Patient Name: Date of Service: Kathryn [name] 10/22/2019 10:15 AM Medical Record [id number] Patient Account Number: [id number] Date of Birth/Sex: Treating RN: 04-Jul-1931 (86 y.o. Kathryn [name] Primary Care Eulogia Dismore: Eliezer Lofts Other Clinician: Referring Kevin Mario: Treating Nasier Thumm/Extender:Robson, [name], [name] in Treatment: 7 Active Problems Location of Pain Severity and Description of Pain Patient Has Paino No Site Locations Pain Management and Medication Current Pain Management: Electronic Signature(s) Signed: 10/25/2019 7:44:20 AM By: Kela Millin Entered By: Kela Millin on 10/22/2019 10:35:43 -------------------------------------------------------------------------------- Patient/Caregiver Education Details Patient Name: Date of Service: Kathryn [name] 1/25/2021andnbsp10:15 AM Medical Record [id number] Patient Account Number: [id number] Date of Birth/Gender: [birth year] (86 y.o. F) Treating RN: Levan Hurst Primary Care Physician: Eliezer Lofts Other Clinician: Referring Physician: Treating Physician/Extender:Robson, [name], [name] in Treatment: 7 Education Assessment Education Provided To: Patient Education Topics Provided Wound/Skin Impairment: Methods: Explain/Verbal Responses: State content correctly Electronic Signature(s) Signed: 10/22/2019 6:46:58 PM By: Levan Hurst RN, BSN Entered By: Levan Hurst on 10/22/2019 18:10:55 -------------------------------------------------------------------------------- Wound Assessment Details Patient Name: Date of Service: Kathryn [name] 10/22/2019 10:15 AM Medical Record [id number] Patient Account Number: [id number] Date of Birth/Sex: Treating RN: 08-09-1931 (86 y.o. F) Primary Care Ahmere Hemenway: Eliezer Lofts Other Clinician: Referring Wauneta Silveria: Treating Jamille Fisher/Extender:Robson, [name], [name] in Treatment: 7 Wound Status Wound Number: 1 Primary Venous Leg Ulcer Etiology: Wound Location: Left Lower Leg - Medial Wound Status: Open Wounding Event: Trauma Comorbid Cataracts, Osteoarthritis, Confinement Date Acquired: 05/29/2019 History:  Anxiety Weeks Of Treatment: 7 Clustered Wound: No Photos Wound Measurements Length: (cm) 5.8 % Red Width: (cm) 2.3 % Red Depth: (cm) 0.2 Epith  Area: (cm) 10.477 Tunn Volume: (cm) 2.095 Unde Wound Description Full Thickness Without Exposed Support Classification: Structures Wound Well defined, not attached Margin: Exudate Large Amount: Exudate Serosanguineous Type: Exudate red, brown Color: Wound Bed Granulation Amount: Medium (34-66%) Granulation Quality: Red, Hyper-granulation Necrotic Amount: Small (1-33%) Necrotic Quality: Adherent Slough Foul Odor After Cleansing: No Slough/Fibrino Yes Exposed Structure Fascia Exposed: No Fat Layer (Subcutaneous Tissue) Exposed: Yes Tendon Exposed: No Muscle Exposed: No Joint Exposed: No Bone Exposed: No uction in Area: -4.9% uction in Volume: 30.1% elialization: Small (1-33%) eling: No rmining: No Treatment Notes Wound #1 (Left, Medial Lower Leg) 1. Cleanse With Wound Cleanser Soap and water 2. Periwound Care Moisturizing lotion Skin Prep 3. Primary Dressing Applied Cellular Based Tissue Product Other primary dressing (specifiy in notes) 4. Secondary Dressing ABD Pad Dry Gauze Other secondary dressing (specify in notes) 6. Support Layer Applied 3 layer compression wrap Notes MD applied apligraf secured with adaptic and steri-strips. carboflex as secondary. netting. Electronic Signature(s) Signed: 10/23/2019 5:09:30 PM By: Mikeal Hawthorne EMT/HBOT Entered By: Mikeal Hawthorne on 10/23/2019 08:00:22 -------------------------------------------------------------------------------- Vitals Details Patient Name: Date of Service: Kathryn [name] 10/22/2019 10:15 AM Medical Record [id number] Patient Account Number: [id number] Date of Birth/Sex: Treating RN: Jul 15, 1931 (86 y.o. Kathryn [name] Primary Care Isella Slatten: Eliezer Lofts Other Clinician: Referring Madalyne Husk: Treating Sharada Albornoz/Extender:Robson,  [name], [name] in Treatment: 7 Vital Signs Time Taken: 10:25 Temperature (F): 97.8 Height (in): 67 Pulse (bpm): 77 Weight (lbs): 125 Respiratory Rate (breaths/min): 18 Body Mass Index (BMI): 19.6 Blood Pressure (mmHg): 130/87 Reference Range: 80 - 120 mg / dl Electronic Signature(s) Signed: 10/25/2019 7:44:20 AM By: Kela Millin Entered By: Kela Millin on 10/22/2019 10:29:19

## 2019-10-29 ENCOUNTER — Encounter (HOSPITAL_BASED_OUTPATIENT_CLINIC_OR_DEPARTMENT_OTHER): Payer: Medicare Other | Admitting: Internal Medicine

## 2019-10-29 ENCOUNTER — Other Ambulatory Visit: Payer: Self-pay

## 2019-10-29 DIAGNOSIS — M1990 Unspecified osteoarthritis, unspecified site: Secondary | ICD-10-CM | POA: Insufficient documentation

## 2019-10-29 DIAGNOSIS — M545 Low back pain: Secondary | ICD-10-CM | POA: Insufficient documentation

## 2019-10-29 DIAGNOSIS — M720 Palmar fascial fibromatosis [Dupuytren]: Secondary | ICD-10-CM | POA: Insufficient documentation

## 2019-10-29 DIAGNOSIS — E785 Hyperlipidemia, unspecified: Secondary | ICD-10-CM | POA: Insufficient documentation

## 2019-10-29 DIAGNOSIS — L97222 Non-pressure chronic ulcer of left calf with fat layer exposed: Secondary | ICD-10-CM | POA: Diagnosis not present

## 2019-10-29 DIAGNOSIS — E538 Deficiency of other specified B group vitamins: Secondary | ICD-10-CM | POA: Diagnosis not present

## 2019-10-29 DIAGNOSIS — L03116 Cellulitis of left lower limb: Secondary | ICD-10-CM | POA: Diagnosis not present

## 2019-10-29 DIAGNOSIS — F419 Anxiety disorder, unspecified: Secondary | ICD-10-CM | POA: Insufficient documentation

## 2019-10-29 DIAGNOSIS — K635 Polyp of colon: Secondary | ICD-10-CM | POA: Diagnosis not present

## 2019-10-29 DIAGNOSIS — I10 Essential (primary) hypertension: Secondary | ICD-10-CM | POA: Insufficient documentation

## 2019-10-29 NOTE — Progress Notes (Signed)
ESSIEMAE, MATER ([id number]) Visit Report for 10/29/2019 Arrival Information Details Patient Name: Date of Service: Kathryn [name], Kathryn [name] 10/29/2019 11:15 AM Medical Record [id number] Patient Account Number: [id number] Date of Birth/Sex: Treating RN: October 23, 1930 (86 y.o. [name], Tammi Klippel Primary Care Tawana Pasch: Eliezer Lofts Other Clinician: Referring Horice Carrero: Treating Denica Web/Extender:Robson, [name], [name] in Treatment: 8 Visit Information History Since Last Visit Added or deleted any medications: No Patient Arrived: Ambulatory Any new allergies or adverse reactions: No Arrival Time: 12:07 Had a fall or experienced change in No Accompanied By: self activities of daily living that may affect Transfer Assistance: None risk of falls: Patient Identification Verified: Yes Signs or symptoms of abuse/neglect since last No Secondary Verification Process Yes visito Completed: Hospitalized since last visit: No Patient Requires Transmission-Based No Implantable device outside of the clinic excluding No Precautions: cellular tissue based products placed in the center Patient Has Alerts: No since last visit: Has Dressing in Place as Prescribed: Yes Has Compression in Place as Prescribed: Yes Pain Present Now: No Electronic Signature(s) Signed: 10/29/2019 6:01:21 PM By: Deon Pilling Entered By: Deon Pilling on 10/29/2019 12:09:14 -------------------------------------------------------------------------------- Compression Therapy Details Patient Name: Date of Service: Kathryn [name] 10/29/2019 11:15 AM Medical Record [id number] Patient Account Number: [id number] Date of Birth/Sex: Treating RN: 07-Sep-1931 (86 y.o. [name] Primary Care Natalia Wittmeyer: Eliezer Lofts Other Clinician: Referring Zarek Relph: Treating Amarilis Belflower/Extender:Robson, [name], [name] in Treatment: 8 Compression Therapy Performed for Wound Wound #1 Left,Medial Lower  Leg Assessment: Performed By: Clinician Deon Pilling, RN Compression Type: Three Layer Pre Treatment ABI: 0.9 Electronic Signature(s) Signed: 10/29/2019 6:01:21 PM By: Deon Pilling Entered By: Deon Pilling on 10/29/2019 12:10:07 -------------------------------------------------------------------------------- Encounter Discharge Information Details Patient Name: Date of Service: Kathryn [name] 10/29/2019 11:15 AM Medical Record [id number] Patient Account Number: [id number] Date of Birth/Sex: Treating RN: 1931/07/04 (86 y.o. [name] Primary Care Carrel Leather: Eliezer Lofts Other Clinician: Referring Dalynn Jhaveri: Treating Eldrick Penick/Extender:Robson, [name], [name] in Treatment: 8 Encounter Discharge Information Items Discharge Condition: Stable Ambulatory Status: Ambulatory Discharge Destination: Home Transportation: Private Auto Accompanied By: self Schedule Follow-up Appointment: Yes Clinical Summary of Care: Electronic Signature(s) Signed: 10/29/2019 6:01:21 PM By: Deon Pilling Entered By: Deon Pilling on 10/29/2019 12:12:29 -------------------------------------------------------------------------------- Patient/Caregiver Education Details Patient Name: Date of Service: Kathryn [name] 2/1/2021andnbsp11:15 AM Medical Record [id number] Patient Account Number: [id number] Date of Birth/Gender: 10/23/1930 (86 y.o. F) Treating RN: Deon Pilling Primary Care Physician: Eliezer Lofts Other Clinician: Referring Physician: Treating Physician/Extender:Robson, [name], [name] in Treatment: 8 Education Assessment Education Provided To: Patient Education Topics Provided Wound/Skin Impairment: Handouts: Skin Care Do's and Dont's Methods: Explain/Verbal Responses: Reinforcements needed Electronic Signature(s) Signed: 10/29/2019 6:01:21 PM By: Deon Pilling Entered By: Deon Pilling on 10/29/2019  12:12:20 -------------------------------------------------------------------------------- Wound Assessment Details Patient Name: Date of Service: Kathryn [name], Kathryn [name] 10/29/2019 11:15 AM Medical Record [id number] Patient Account Number: [id number] Date of Birth/Sex: Treating RN: 07-18-1931 (86 y.o. [name] Primary Care Audon Heymann: Eliezer Lofts Other Clinician: Referring Eneida Evers: Treating Danyela Posas/Extender:Robson, [name], [name] in Treatment: 8 Wound Status Wound Number: 1 Primary Etiology: Venous Leg Ulcer Wound Location: Left, Medial Lower Leg Wound Status: Open Wounding Event: Trauma Date Acquired: 05/29/2019 Weeks Of Treatment: 8 Clustered Wound: No Wound Measurements Length: (cm) 5.8 % Reduct Width: (cm) 2.3 % Reduct Depth: (cm) 0.2 Area: (cm) 10.477 Volume: (cm) 2.095 Wound Description Classification: Full Thickness Without Exposed Support Structures ion in Area: -4.9% ion in Volume: 30.1% Treatment Notes Wound #1 (Left, Medial Lower Leg) 2. Periwound  Care Moisturizing lotion 3. Primary Dressing Applied Cellular Based Tissue Product Other primary dressing (specifiy in notes) 4. Secondary Dressing ABD Pad Dry Gauze Other secondary dressing (specify in notes) 6. Support Layer Applied 3 layer compression wrap Notes apligraf, adaptic, and steri-strips left in place. carboflex as secondary. netting. Electronic Signature(s) Signed: 10/29/2019 6:01:21 PM By: Deon Pilling Entered By: Deon Pilling on 10/29/2019 12:09:44 -------------------------------------------------------------------------------- Vitals Details Patient Name: Date of Service: Kathryn [name] 10/29/2019 11:15 AM Medical Record [id number] Patient Account Number: [id number] Date of Birth/Sex: Treating RN: July 15, 1931 (86 y.o. [name] Primary Care Abdulrahman Bracey: Eliezer Lofts Other Clinician: Referring Hayly Litsey: Treating Creedon Danielski/Extender:Robson, [name],  [name] in Treatment: 8 Vital Signs Time Taken: 12:07 Temperature (F): 98.2 Height (in): 67 Pulse (bpm): 74 Weight (lbs): 125 Respiratory Rate (breaths/min): 20 Body Mass Index (BMI): 19.6 Blood Pressure (mmHg): 133/81 Reference Range: 80 - 120 mg / dl Electronic Signature(s) Signed: 10/29/2019 6:01:21 PM By: Deon Pilling Entered By: Deon Pilling on 10/29/2019 12:10:25

## 2019-10-29 NOTE — Progress Notes (Signed)
JONIER, GATEWOOD ([id number]) Visit Report for 10/29/2019 SuperBill Details Patient Name: Date of Service: LIMOR, MAHIN 10/29/2019 Medical Record [id number] Patient Account Number: [id number] Date of Birth/Sex: Treating RN: 11/02/1930 (86 y.o. [name], Tammi Klippel Primary Care Provider: Eliezer Lofts Other Clinician: Referring Provider: Treating Provider/Extender:Youa Deloney, [name], [name] in Treatment: 8 Diagnosis Coding ICD-10 Codes Code Description I87.303 Chronic venous hypertension (idiopathic) without complications of bilateral lower extremity S81.812D Laceration without foreign body, left lower leg, subsequent encounter L97.222 Non-pressure chronic ulcer of left calf with fat layer exposed L03.116 Cellulitis of left lower limb Facility Procedures CPT4 Code Description Modifier Quantity [id number] (Facility Use Only) [phone] - APPLY MULTLAY COMPRS LWR LT LEG 1 Electronic Signature(s) Signed: 10/29/2019 6:01:21 PM By: Deon Pilling Signed: 10/29/2019 6:41:12 PM By: Linton Ham MD Entered By: Deon Pilling on 10/29/2019 12:12:40

## 2019-11-05 ENCOUNTER — Other Ambulatory Visit: Payer: Self-pay

## 2019-11-05 ENCOUNTER — Encounter (HOSPITAL_BASED_OUTPATIENT_CLINIC_OR_DEPARTMENT_OTHER): Payer: Medicare Other | Attending: Internal Medicine | Admitting: Internal Medicine

## 2019-11-05 DIAGNOSIS — M545 Low back pain: Secondary | ICD-10-CM | POA: Diagnosis not present

## 2019-11-05 DIAGNOSIS — F419 Anxiety disorder, unspecified: Secondary | ICD-10-CM | POA: Diagnosis not present

## 2019-11-05 DIAGNOSIS — L97222 Non-pressure chronic ulcer of left calf with fat layer exposed: Secondary | ICD-10-CM | POA: Diagnosis not present

## 2019-11-05 DIAGNOSIS — I87312 Chronic venous hypertension (idiopathic) with ulcer of left lower extremity: Secondary | ICD-10-CM | POA: Diagnosis not present

## 2019-11-05 DIAGNOSIS — L03116 Cellulitis of left lower limb: Secondary | ICD-10-CM | POA: Diagnosis not present

## 2019-11-05 DIAGNOSIS — E785 Hyperlipidemia, unspecified: Secondary | ICD-10-CM | POA: Diagnosis not present

## 2019-11-05 DIAGNOSIS — E538 Deficiency of other specified B group vitamins: Secondary | ICD-10-CM | POA: Diagnosis not present

## 2019-11-06 NOTE — Progress Notes (Signed)
Kathryn [name], Kathryn [name] ([id number]) Visit Report for 11/05/2019 Cellular or Tissue Based Product Details Patient Name: Date of Service: Kathryn [name], Kathryn [name] 11/05/2019 10:00 AM Medical Record [id number] Patient Account Number: [id number] Date of Birth/Sex: Treating RN: [birth year] (86 y.o. F) Primary Care Provider: Eliezer Lofts Other Clinician: Referring Provider: Treating Provider/Extender:Coren Sagan, [name], [name] in Treatment: 9 Cellular or Tissue Based Wound #1 Left,Medial Lower Leg Product Type Applied to: Performed By: Physician Ricard Dillon., MD Cellular or Tissue Based Apligraf Product Type: Level of Consciousness (Pre- Awake and Alert procedure): Pre-procedure Yes - 10:58 Verification/Time Out Taken: Location: trunk / arms / legs Wound Size (sq cm): 9 Product Size (sq cm): 44 Waste Size (sq cm): 10 Waste Reason: wound size Amount of Product Applied (sq cm): 34 Lot #: [id number] Order #: 3 Expiration Date: 11/09/2019 Fenestrated: Yes Instrument: Blade Reconstituted: Yes Solution Type: normal saline Solution Amount: 10 ml Lot #[id number] Solution Expiration Date: 05/27/2021 Secured: Yes Secured With: Steri-Strips Dressing Applied: Yes Primary Dressing: adaptic, gauze, ABD Procedural Pain: 0 Post Procedural Pain: 0 Response to Treatment: Procedure was tolerated well Level of Consciousness Awake and Alert (Post-procedure): Post Procedure Diagnosis Same as Pre-procedure Electronic Signature(s) Signed: 11/05/2019 6:01:37 PM By: Linton Ham MD Entered By: Linton Ham on 11/05/2019 11:06:17 -------------------------------------------------------------------------------- Debridement Details Patient Name: Date of Service: Kathryn [name] 11/05/2019 10:00 AM Medical Record [id number] Patient Account Number: [id number] Date of Birth/Sex: Treating RN: [birth year] (86 y.o. F) Primary Care Provider: Eliezer Lofts Other Clinician: Referring Provider:  Treating Provider/Extender:Olene Godfrey, [name], [name] in Treatment: 9 Debridement Performed for Wound #1 Left,Medial Lower Leg Assessment: Performed By: Physician Ricard Dillon., MD Debridement Type: Debridement Severity of Tissue Pre Fat layer exposed Debridement: Level of Consciousness (Pre- Awake and Alert procedure): Pre-procedure Yes - 10:54 Verification/Time Out Taken: Start Time: 10:54 Pain Control: Other : Benzocaine 20% Total Area Debrided (L x W): 5 (cm) x 1.8 (cm) = 9 (cm) Tissue and other material Viable, Non-Viable, Subcutaneous debrided: Level: Skin/Subcutaneous Tissue Debridement Description: Excisional Instrument: Blade Bleeding: Moderate Hemostasis Achieved: Silver Nitrate End Time: 10:55 Procedural Pain: 3 Post Procedural Pain: 0 Response to Treatment: Procedure was tolerated well Level of Consciousness Awake and Alert (Post-procedure): Post Debridement Measurements of Total Wound Length: (cm) 5 Width: (cm) 1.8 Depth: (cm) 0.1 Volume: (cm) 0.707 Character of Wound/Ulcer Post Improved Debridement: Severity of Tissue Post Debridement: Fat layer exposed Post Procedure Diagnosis Same as Pre-procedure Electronic Signature(s) Signed: 11/05/2019 6:01:37 PM By: Linton Ham MD Entered By: Linton Ham on 11/05/2019 11:06:08 -------------------------------------------------------------------------------- HPI Details Patient Name: Date of Service: Kathryn [name] 11/05/2019 10:00 AM Medical Record [id number] Patient Account Number: [id number] Date of Birth/Sex: Treating RN: Jun 22, 1931 (86 y.o. F) Primary Care Provider: Eliezer Lofts Other Clinician: Referring Provider: Treating Provider/Extender:Domenique Southers, [name], [name] in Treatment: 9 History of Present Illness HPI Description: ADMISSION 08/28/19 This is an 86 year old woman who is still very active still working full-time at [employer] Sometime in late  September her left calf was hit by the harness of her dog who was chasing another person. She was left with a fairly clear skin tear she tried to pull the skin back over the wound but it it did not maintain. She was seen in her primary doctor's office on 07/20/2019 she was given a topical antibiotic and cephalexin. She was reviewed in primary care on 11/3 and it was felt the wound might need debridement and she was sent here. The patient has chronic venous insufficiency however she  does not have a history of recurrent chronic wounds. Past medical history includes cellulitis of the left foot, hyperlipidemia, irregular heart rate, colonic polyps., Low back pain, B12 deficiency, Dupuytren's contractures, recurrent UTIs she has a history of bilateral hip surgery for osteoarthritis ABI in our clinic was 0.94 on the left 12/8; deep laceration wound on the left medial mid tibia. We are using Iodoflex under compression. She is not eligible for home health 12/15; deep laceration wound on the left mid tibia. We are using Iodoflex under compression. Perhaps some minimal improvement in wound surface certainly no improvement in surface area 12/18; the patient called in to report drainage coming out of the wound and increasing pain. She was brought in for a nurse visit. Noted to have serosanguineous drainage and I was asked to look at this. 12/21; 3-day follow-up. Culture I did on 12/18 grew multiple organisms but none predominated. In spite of this the doxycycline really seems to have helped there is much less surrounding erythema. We allowed her to change the dressing over the weekend. The patient still works at [employer] There is edema around the wound because we did not wrap her last time. I wanted her to be able to continue to monitor the degree of erythema 12/28; the patient is not working and telling me she is staying off her foot is much as possible. She was approved for Apligraf but there was still  too much debris over the wound surface today to consider this. Extensive debridement change dressing to Iodoflex 10/01/2019. The patient's wound is in a much better surface this week. We will order the Apligraf but continue Iodoflex for this week 1/11; healthy looking wound surface. Apligraf #1. No evidence of infection 1/25; wound down 1 cm in width and length. Apligraf #2 2/8; the wound is down in length and width. Apligraf #3 Electronic Signature(s) Signed: 11/05/2019 6:01:37 PM By: Linton Ham MD Entered By: Linton Ham on 11/05/2019 11:06:42 -------------------------------------------------------------------------------- Physical Exam Details Patient Name: Date of Service: Kathryn [name] 11/05/2019 10:00 AM Medical Record [id number] Patient Account Number: [id number] Date of Birth/Sex: Treating RN: 12-Dec-1930 (86 y.o. F) Primary Care Provider: Eliezer Lofts Other Clinician: Referring Provider: Treating Provider/Extender:Caddie Randle, [name], [name] in Treatment: 9 Constitutional Patient is hypertensive.. Pulse regular and within target range for patient.Marland Kitchen Respirations regular, non-labored and within target range.. Temperature is normal and within the target range for the patient.Marland Kitchen Appears in no distress. Notes Wound exam; her wound is come in quite a bit although quite a bit of hyper granulation over the entirety of the wound surface. I noted some surrounding epithelialization around the hyper granulation. After some thought I elected to remove the hyper granulation with a #10 blade and then silver nitrate. We then reapplied Apligraf #3 Electronic Signature(s) Signed: 11/05/2019 6:01:37 PM By: Linton Ham MD Entered By: Linton Ham on 11/05/2019 11:09:31 -------------------------------------------------------------------------------- Physician Orders Details Patient Name: Date of Service: Kathryn [name] 11/05/2019 10:00 AM Medical Record [id number]  Patient Account Number: [id number] Date of Birth/Sex: Treating RN: Oct 06, 1930 (86 y.o. [name] Primary Care Provider: Eliezer Lofts Other Clinician: Referring Provider: Treating Provider/Extender:Takeria Marquina, [name], [name] in Treatment: 9 Verbal / Phone Orders: No Diagnosis Coding ICD-10 Coding Code Description I87.303 Chronic venous hypertension (idiopathic) without complications of bilateral lower extremity S81.812D Laceration without foreign body, left lower leg, subsequent encounter L97.222 Non-pressure chronic ulcer of left calf with fat layer exposed L03.116 Cellulitis of left lower limb Follow-up Appointments Return Appointment in 2 weeks. -  MD Nurse Visit: - 1 week for rewrap Dressing Change Frequency Wound #1 Left,Medial Lower Leg Do not change entire dressing for one week. Skin Barriers/Peri-Wound Care Wound #1 Left,Medial Lower Leg Moisturizing lotion - to leg Wound Cleansing Wound #1 Left,Medial Lower Leg May shower with protection. Primary Wound Dressing Wound #1 Left,Medial Lower Leg Skin Substitute Application - Apligraf #3 Secondary Dressing Wound #1 Left,Medial Lower Leg Adaptic Dressing - secure with steri strips ABD pad Carboflex Edema Control 3 Layer Compression System - Left Lower Extremity Avoid standing for long periods of time Elevate legs to the level of the heart or above for 30 minutes daily and/or when sitting, a frequency of: - throughout the day Exercise regularly Electronic Signature(s) Signed: 11/05/2019 6:01:37 PM By: Linton Ham MD Signed: 11/06/2019 5:30:42 PM By: Levan Hurst RN, BSN Entered By: Levan Hurst on 11/05/2019 11:00:58 -------------------------------------------------------------------------------- Problem List Details Patient Name: Date of Service: Kathryn [name] 11/05/2019 10:00 AM Medical Record [id number] Patient Account Number: [id number] Date of Birth/Sex: Treating RN: January 17, 1931 (86  y.o. [name] Primary Care Provider: Eliezer Lofts Other Clinician: Referring Provider: Treating Provider/Extender:Yazmin Locher, [name], [name] in Treatment: 9 Active Problems ICD-10 Evaluated Encounter Code Description Active Date Today Diagnosis I87.303 Chronic venous hypertension (idiopathic) without 99991111 No Yes complications of bilateral lower extremity S81.812D Laceration without foreign body, left lower leg, 08/28/2019 No Yes subsequent encounter L97.222 Non-pressure chronic ulcer of left calf with fat layer 08/28/2019 No Yes exposed L03.116 Cellulitis of left lower limb 09/14/2019 No Yes Inactive Problems Resolved Problems Electronic Signature(s) Signed: 11/05/2019 6:01:37 PM By: Linton Ham MD Entered By: Linton Ham on 11/05/2019 11:05:28 -------------------------------------------------------------------------------- Progress Note Details Patient Name: Date of Service: Kathryn [name] 11/05/2019 10:00 AM Medical Record [id number] Patient Account Number: [id number] Date of Birth/Sex: Treating RN: March 06, 1931 (86 y.o. F) Primary Care Provider: Eliezer Lofts Other Clinician: Referring Provider: Treating Provider/Extender:Casmir Auguste, [name], [name] in Treatment: 9 Subjective History of Present Illness (HPI) ADMISSION 08/28/19 This is an 86 year old woman who is still very active still working full-time at [employer] Sometime in late September her left calf was hit by the harness of her dog who was chasing another person. She was left with a fairly clear skin tear she tried to pull the skin back over the wound but it it did not maintain. She was seen in her primary doctor's office on 07/20/2019 she was given a topical antibiotic and cephalexin. She was reviewed in primary care on 11/3 and it was felt the wound might need debridement and she was sent here. The patient has chronic venous insufficiency however she does not have a history of  recurrent chronic wounds. Past medical history includes cellulitis of the left foot, hyperlipidemia, irregular heart rate, colonic polyps., Low back pain, B12 deficiency, Dupuytren's contractures, recurrent UTIs she has a history of bilateral hip surgery for osteoarthritis ABI in our clinic was 0.94 on the left 12/8; deep laceration wound on the left medial mid tibia. We are using Iodoflex under compression. She is not eligible for home health 12/15; deep laceration wound on the left mid tibia. We are using Iodoflex under compression. Perhaps some minimal improvement in wound surface certainly no improvement in surface area 12/18; the patient called in to report drainage coming out of the wound and increasing pain. She was brought in for a nurse visit. Noted to have serosanguineous drainage and I was asked to look at this. 12/21; 3-day follow-up. Culture I did on 12/18 grew multiple organisms but  none predominated. In spite of this the doxycycline really seems to have helped there is much less surrounding erythema. We allowed her to change the dressing over the weekend. The patient still works at [employer] There is edema around the wound because we did not wrap her last time. I wanted her to be able to continue to monitor the degree of erythema 12/28; the patient is not working and telling me she is staying off her foot is much as possible. She was approved for Apligraf but there was still too much debris over the wound surface today to consider this. Extensive debridement change dressing to Iodoflex 10/01/2019. The patient's wound is in a much better surface this week. We will order the Apligraf but continue Iodoflex for this week 1/11; healthy looking wound surface. Apligraf #1. No evidence of infection 1/25; wound down 1 cm in width and length. Apligraf #2 2/8; the wound is down in length and width. Apligraf #3 Objective Constitutional Patient is hypertensive.. Pulse regular and within  target range for patient.Marland Kitchen Respirations regular, non-labored and within target range.. Temperature is normal and within the target range for the patient.Marland Kitchen Appears in no distress. Vitals Time Taken: 10:12 AM, Height: 67 in, Weight: 125 lbs, BMI: 19.6, Temperature: 98.0 F, Pulse: 80 bpm, Respiratory Rate: 20 breaths/min, Blood Pressure: 163/87 mmHg. General Notes: Wound exam; her wound is come in quite a bit although quite a bit of hyper granulation over the entirety of the wound surface. I noted some surrounding epithelialization around the hyper granulation. After some thought I elected to remove the hyper granulation with a #10 blade and then silver nitrate. We then reapplied Apligraf #3 Integumentary (Hair, Skin) Wound #1 status is Open. Original cause of wound was Trauma. The wound is located on the Left,Medial Lower Leg. The wound measures 5cm length x 1.8cm width x 0.1cm depth; 7.069cm^2 area and 0.707cm^3 volume. There is Fat Layer (Subcutaneous Tissue) Exposed exposed. There is no tunneling or undermining noted. There is a medium amount of serosanguineous drainage noted. The wound margin is flat and intact. There is large (67-100%) pink, pale, hyper - granulation within the wound bed. There is no necrotic tissue within the wound bed. Assessment Active Problems ICD-10 Chronic venous hypertension (idiopathic) without complications of bilateral lower extremity Laceration without foreign body, left lower leg, subsequent encounter Non-pressure chronic ulcer of left calf with fat layer exposed Cellulitis of left lower limb Procedures Wound #1 Pre-procedure diagnosis of Wound #1 is a Venous Leg Ulcer located on the Left,Medial Lower Leg .Severity of Tissue Pre Debridement is: Fat layer exposed. There was a Excisional Skin/Subcutaneous Tissue Debridement with a total area of 9 sq cm performed by Ricard Dillon., MD. With the following instrument(s): Blade to remove Viable and Non-Viable  tissue/material. Material removed includes Subcutaneous Tissue after achieving pain control using Other (Benzocaine 20%). No specimens were taken. A time out was conducted at 10:54, prior to the start of the procedure. A Moderate amount of bleeding was controlled with Silver Nitrate. The procedure was tolerated well with a pain level of 3 throughout and a pain level of 0 following the procedure. Post Debridement Measurements: 5cm length x 1.8cm width x 0.1cm depth; 0.707cm^3 volume. Character of Wound/Ulcer Post Debridement is improved. Severity of Tissue Post Debridement is: Fat layer exposed. Post procedure Diagnosis Wound #1: Same as Pre-Procedure Pre-procedure diagnosis of Wound #1 is a Venous Leg Ulcer located on the Left,Medial Lower Leg. A skin graft procedure using a bioengineered skin substitute/cellular  or tissue based product was performed by Ricard Dillon., MD. Apligraf was applied and secured with Steri-Strips. 34 sq cm of product was utilized and 10 sq cm was wasted due to wound size. Post Application, adaptic, gauze, ABD was applied. A Time Out was conducted at 10:58, prior to the start of the procedure. The procedure was tolerated well with a pain level of 0 throughout and a pain level of 0 following the procedure. Post procedure Diagnosis Wound #1: Same as Pre-Procedure . Pre-procedure diagnosis of Wound #1 is a Venous Leg Ulcer located on the Left,Medial Lower Leg . There was a Three Layer Compression Therapy Procedure by Levan Hurst, RN. Post procedure Diagnosis Wound #1: Same as Pre-Procedure Plan Follow-up Appointments: Return Appointment in 2 weeks. - MD Nurse Visit: - 1 week for rewrap Dressing Change Frequency: Wound #1 Left,Medial Lower Leg: Do not change entire dressing for one week. Skin Barriers/Peri-Wound Care: Wound #1 Left,Medial Lower Leg: Moisturizing lotion - to leg Wound Cleansing: Wound #1 Left,Medial Lower Leg: May shower with  protection. Primary Wound Dressing: Wound #1 Left,Medial Lower Leg: Skin Substitute Application - Apligraf #3 Secondary Dressing: Wound #1 Left,Medial Lower Leg: Adaptic Dressing - secure with steri strips ABD pad Carboflex Edema Control: 3 Layer Compression System - Left Lower Extremity Avoid standing for long periods of time Elevate legs to the level of the heart or above for 30 minutes daily and/or when sitting, a frequency of: - throughout the day Exercise regularly 1. There is now no depth to this wound which is really very helpful and no undermining. 2. After some thoughts I elected to remove the hyper granulation with a #10 blade followed by silver nitrate and then repeat reapply the Apligraf. The dimensions of the wound to come in. Electronic Signature(s) Signed: 11/05/2019 6:01:37 PM By: Linton Ham MD Entered By: Linton Ham on 11/05/2019 11:10:18 -------------------------------------------------------------------------------- SuperBill Details Patient Name: Date of Service: Kathryn [name] 11/05/2019 Medical Record [id number] Patient Account Number: [id number] Date of Birth/Sex: Treating RN: 08-16-1931 (86 y.o. F) Primary Care Provider: Eliezer Lofts Other Clinician: Referring Provider: Treating Provider/Extender:Kaiah Hosea, [name], [name] in Treatment: 9 Diagnosis Coding ICD-10 Codes Code Description I87.303 Chronic venous hypertension (idiopathic) without complications of bilateral lower extremity S81.812D Laceration without foreign body, left lower leg, subsequent encounter L97.222 Non-pressure chronic ulcer of left calf with fat layer exposed L03.116 Cellulitis of left lower limb Facility Procedures CPT4 Code Description: [id number] (Facility Use Only) Apligraf 1 SQ CM Modifier: Quantity: 42 CPT4 Code Description: [id number] - SKIN SUB GRAFT TRNK/ARM/LEG ICD-10 Diagnosis Description I87.303 Chronic venous hypertension (idiopathic) without  complications extremity Modifier: of bilateral lo Quantity: 1 wer Physician Procedures CPT4: Code [id number] Description: 41 - WC PHYS SKIN SUB GRAFT TRNK/ARM/LEG ICD-10 Diagnosis Description ICD-10 Diagnosis Description I87.303 Chronic venous hypertension (idiopathic) without complications of extremity Modifier: bilateral lowe Quantity: 1 r Electronic Signature(s) Signed: 11/05/2019 6:01:37 PM By: Linton Ham MD Signed: 11/06/2019 5:30:42 PM By: Levan Hurst RN, BSN Entered By: Levan Hurst on 11/05/2019 12:58:30

## 2019-11-12 ENCOUNTER — Other Ambulatory Visit: Payer: Self-pay

## 2019-11-12 ENCOUNTER — Encounter (HOSPITAL_BASED_OUTPATIENT_CLINIC_OR_DEPARTMENT_OTHER): Payer: Medicare Other | Admitting: Internal Medicine

## 2019-11-12 DIAGNOSIS — M545 Low back pain: Secondary | ICD-10-CM | POA: Diagnosis not present

## 2019-11-12 DIAGNOSIS — L97222 Non-pressure chronic ulcer of left calf with fat layer exposed: Secondary | ICD-10-CM | POA: Diagnosis not present

## 2019-11-12 DIAGNOSIS — L03116 Cellulitis of left lower limb: Secondary | ICD-10-CM | POA: Diagnosis not present

## 2019-11-12 DIAGNOSIS — E785 Hyperlipidemia, unspecified: Secondary | ICD-10-CM | POA: Diagnosis not present

## 2019-11-12 DIAGNOSIS — F419 Anxiety disorder, unspecified: Secondary | ICD-10-CM | POA: Diagnosis not present

## 2019-11-12 DIAGNOSIS — E538 Deficiency of other specified B group vitamins: Secondary | ICD-10-CM | POA: Diagnosis not present

## 2019-11-14 NOTE — Progress Notes (Signed)
Kathryn [name], Kathryn [name] ([id number]) Visit Report for 11/12/2019 Arrival Information Details Patient Name: Date of Service: Kathryn [name], Kathryn [name] 11/12/2019 9:45 AM Medical Record [id number] Patient Account Number: [id number] Date of Birth/Sex: Treating RN: [birth year] (86 y.o. Orvan Falconer Primary Care Chevella Pearce: Eliezer Lofts Other Clinician: Referring Dagny Fiorentino: Treating Dru Laurel/Extender:Robson, [name], [name] in Treatment: 10 Visit Information History Since Last Visit All ordered tests and consults were completed: No Patient Arrived: Ambulatory Added or deleted any medications: No Arrival Time: 10:31 Any new allergies or adverse reactions: No Accompanied By: self Had a fall or experienced change in No Transfer Assistance: None activities of daily living that may affect Patient Identification Verified: Yes risk of falls: Secondary Verification Process Yes Signs or symptoms of abuse/neglect since last No Completed: visito Patient Requires Transmission-Based No Hospitalized since last visit: No Precautions: Implantable device outside of the clinic excluding No Patient Has Alerts: No cellular tissue based products placed in the center since last visit: Has Dressing in Place as Prescribed: Yes Has Compression in Place as Prescribed: Yes Pain Present Now: No Electronic Signature(s) Signed: 11/14/2019 5:45:19 PM By: Carlene Coria RN Entered By: Carlene Coria on 11/12/2019 10:31:42 -------------------------------------------------------------------------------- Compression Therapy Details Patient Name: Date of Service: Kathryn [name], Kathryn [name] 11/12/2019 9:45 AM Medical Record [id number] Patient Account Number: [id number] Date of Birth/Sex: Treating RN: 1931-08-20 (86 y.o. Orvan Falconer Primary Care Chaos Carlile: Eliezer Lofts Other Clinician: Referring Kevontae Burgoon: Treating Dorrell Mitcheltree/Extender:Robson, [name], [name] in Treatment: 10 Compression Therapy Performed for  Wound Wound #1 Left,Medial Lower Leg Assessment: Performed By: Clinician Carlene Coria, RN Compression Type: Three Layer Electronic Signature(s) Signed: 11/14/2019 5:45:19 PM By: Carlene Coria RN Entered By: Carlene Coria on 11/12/2019 10:45:31 -------------------------------------------------------------------------------- Encounter Discharge Information Details Patient Name: Date of Service: Kathryn [name] 11/12/2019 9:45 AM Medical Record [id number] Patient Account Number: [id number] Date of Birth/Sex: Treating RN: March 02, 1931 (86 y.o. Orvan Falconer Primary Care Arlester Keehan: Eliezer Lofts Other Clinician: Referring Carols Clemence: Treating Usbaldo Pannone/Extender:Robson, [name], [name] in Treatment: 10 Encounter Discharge Information Items Discharge Condition: Stable Ambulatory Status: Ambulatory Discharge Destination: Home Transportation: Private Auto Accompanied By: self Schedule Follow-up Appointment: Yes Clinical Summary of Care: Patient Declined Electronic Signature(s) Signed: 11/14/2019 5:45:19 PM By: Carlene Coria RN Entered By: Carlene Coria on 11/12/2019 10:47:05 -------------------------------------------------------------------------------- Patient/Caregiver Education Details Patient Name: Date of Service: Kathryn [name] 2/15/2021andnbsp9:45 AM Medical Record [id number] Patient Account Number: [id number] Date of Birth/Gender: [birth year] (86 y.o. F) Treating RN: Carlene Coria Primary Care Physician: Eliezer Lofts Other Clinician: Referring Physician: Treating Physician/Extender:Robson, [name], [name] in Treatment: 10 Education Assessment Education Provided To: Patient Education Topics Provided Wound/Skin Impairment: Methods: Explain/Verbal Responses: State content correctly Electronic Signature(s) Signed: 11/14/2019 5:45:19 PM By: Carlene Coria RN Entered By: Carlene Coria on 11/12/2019  10:46:50 -------------------------------------------------------------------------------- Wound Assessment Details Patient Name: Date of Service: Kathryn [name], Kathryn [name] 11/12/2019 9:45 AM Medical Record [id number] Patient Account Number: [id number] Date of Birth/Sex: Treating RN: 09/28/1930 (86 y.o. Orvan Falconer Primary Care Johnika Escareno: Eliezer Lofts Other Clinician: Referring Laurencia Roma: Treating Airika Alkhatib/Extender:Robson, [name], [name] in Treatment: 10 Wound Status Wound Number: 1 Primary Etiology: Venous Leg Ulcer Wound Location: Left, Medial Lower Leg Wound Status: Open Wounding Event: Trauma Date Acquired: 05/29/2019 Weeks Of Treatment: 10 Clustered Wound: No Wound Measurements Length: (cm) 5 Width: (cm) 1.8 Depth: (cm) 0.1 Area: (cm) 7.069 Volume: (cm) 0.707 Wound Description Classification: Full Thickness Without Exposed Suppor Structures % Reduction in Area: 29.2% % Reduction in Volume: 76.4% t Treatment Notes Wound #1 (Left, Medial  Lower Leg) 1. Cleanse With Wound Cleanser Soap and water 4. Secondary Dressing ABD Pad Dry Gauze 6. Support Layer Applied 3 layer compression wrap Notes apligraf, adaptic, and steri-strips, carboflex as secondary. netting. Electronic Signature(s) Signed: 11/14/2019 5:45:19 PM By: Carlene Coria RN Entered By: Carlene Coria on 11/12/2019 10:32:13 -------------------------------------------------------------------------------- Vitals Details Patient Name: Date of Service: Kathryn [name] 11/12/2019 9:45 AM Medical Record [id number] Patient Account Number: [id number] Date of Birth/Sex: Treating RN: Oct 17, 1930 (86 y.o. Orvan Falconer Primary Care Tucker Steedley: Eliezer Lofts Other Clinician: Referring Astraea Gaughran: Treating Luwanda Starr/Extender:Robson, [name], [name] in Treatment: 10 Vital Signs Time Taken: 10:31 Temperature (F): 98.2 Height (in): 67 Pulse (bpm): 75 Weight (lbs): 125 Respiratory Rate  (breaths/min): 18 Body Mass Index (BMI): 19.6 Blood Pressure (mmHg): 148/80 Reference Range: 80 - 120 mg / dl Electronic Signature(s) Signed: 11/14/2019 5:45:19 PM By: Carlene Coria RN Entered By: Carlene Coria on 11/12/2019 10:32:03

## 2019-11-14 NOTE — Progress Notes (Signed)
Kathryn [name], Kathryn [name] ([id number]) Visit Report for 11/12/2019 SuperBill Details Patient Name: Date of Service: Kathryn [name], Kathryn [name] 11/12/2019 Medical Record [id number] Patient Account Number: [id number] Date of Birth/Sex: Treating RN: Dec 28, 1930 (86 y.o. Orvan Falconer Primary Care Provider: Eliezer Lofts Other Clinician: Referring Provider: Treating Provider/Extender:Robson, [name], [name] in Treatment: 10 Diagnosis Coding ICD-10 Codes Code Description I87.303 Chronic venous hypertension (idiopathic) without complications of bilateral lower extremity S81.812D Laceration without foreign body, left lower leg, subsequent encounter L97.222 Non-pressure chronic ulcer of left calf with fat layer exposed L03.116 Cellulitis of left lower limb Facility Procedures CPT4 Code Description Modifier Quantity [id number] (Facility Use Only) [phone] - APPLY MULTLAY COMPRS LWR LT LEG 1 Electronic Signature(s) Signed: 11/12/2019 6:16:05 PM By: Linton Ham MD Signed: 11/14/2019 5:45:19 PM By: Carlene Coria RN Entered By: Carlene Coria on 11/12/2019 10:47:17

## 2019-11-19 ENCOUNTER — Encounter (HOSPITAL_BASED_OUTPATIENT_CLINIC_OR_DEPARTMENT_OTHER): Payer: Medicare Other | Admitting: Internal Medicine

## 2019-11-19 ENCOUNTER — Other Ambulatory Visit: Payer: Self-pay

## 2019-11-19 DIAGNOSIS — E785 Hyperlipidemia, unspecified: Secondary | ICD-10-CM | POA: Diagnosis not present

## 2019-11-19 DIAGNOSIS — L03116 Cellulitis of left lower limb: Secondary | ICD-10-CM | POA: Diagnosis not present

## 2019-11-19 DIAGNOSIS — F419 Anxiety disorder, unspecified: Secondary | ICD-10-CM | POA: Diagnosis not present

## 2019-11-19 DIAGNOSIS — L97222 Non-pressure chronic ulcer of left calf with fat layer exposed: Secondary | ICD-10-CM | POA: Diagnosis not present

## 2019-11-19 DIAGNOSIS — E538 Deficiency of other specified B group vitamins: Secondary | ICD-10-CM | POA: Diagnosis not present

## 2019-11-19 DIAGNOSIS — I87312 Chronic venous hypertension (idiopathic) with ulcer of left lower extremity: Secondary | ICD-10-CM | POA: Diagnosis not present

## 2019-11-19 DIAGNOSIS — M545 Low back pain: Secondary | ICD-10-CM | POA: Diagnosis not present

## 2019-11-22 NOTE — Progress Notes (Signed)
Kathryn [name], Kathryn [name] ([id number]) Visit Report for 11/19/2019 Cellular or Tissue Based Product Details Patient Name: Date of Service: Kathryn, [name] 11/19/2019 10:30 AM Medical Record [id number] Patient Account Number: [id number] Date of Birth/Sex: Treating RN: 11-12-1930 (86 y.o. Kathryn [name] Primary Care Provider: Eliezer Lofts Other Clinician: Referring Provider: Treating Provider/Extender:Korey Arroyo, [name], [name] in Treatment: 11 Cellular or Tissue Based Wound #1 Left,Medial Lower Leg Product Type Applied to: Performed By: Physician Ricard Dillon., MD Cellular or Tissue Based Apligraf Product Type: Level of Consciousness (Pre- Awake and Alert procedure): Pre-procedure Yes - 10:45 Verification/Time Out Taken: Location: trunk / arms / legs Wound Size (sq cm): 8.55 Product Size (sq cm): 44 Waste Size (sq cm): 14 Waste Reason: wound size Amount of Product Applied (sq cm): 30 Lot #: [id number] Order #: 4 Expiration Date: 11/24/2019 Fenestrated: Yes Instrument: Blade Reconstituted: Yes Solution Type: normal saline Solution Amount: 10 ml Lot #[id number] Expiration Date: 05/27/2021 Secured: Yes Secured With: Steri-Strips Dressing Applied: Yes Primary Dressing: abd pad, carboflex, compression wrap Procedural Pain: 0 Post Procedural Pain: 0 Response to Treatment: Procedure was tolerated well Level of Consciousness Awake and Alert (Post-procedure): Post Procedure Diagnosis Same as Pre-procedure Electronic Signature(s) Signed: 11/19/2019 5:55:43 PM By: Linton Ham MD Entered By: Linton Ham on 11/19/2019 10:50:42 -------------------------------------------------------------------------------- HPI Details Patient Name: Date of Service: Kathryn [name] 11/19/2019 10:30 AM Medical Record [id number] Patient Account Number: [id number] Date of Birth/Sex: Treating RN: [birth year] (86 y.o. Kathryn [name] Primary Care Provider:  Eliezer Lofts Other Clinician: Referring Provider: Treating Provider/Extender:Tensley Wery, [name], [name] in Treatment: 11 History of Present Illness HPI Description: ADMISSION 08/28/19 This is an 86 year old woman who is still very active still working full-time at [employer] Sometime in late September her left calf was hit by the harness of her dog who was chasing another person. She was left with a fairly clear skin tear she tried to pull the skin back over the wound but it it did not maintain. She was seen in her primary doctor's office on 07/20/2019 she was given a topical antibiotic and cephalexin. She was reviewed in primary care on 11/3 and it was felt the wound might need debridement and she was sent here. The patient has chronic venous insufficiency however she does not have a history of recurrent chronic wounds. Past medical history includes cellulitis of the left foot, hyperlipidemia, irregular heart rate, colonic polyps., Low back pain, B12 deficiency, Dupuytren's contractures, recurrent UTIs she has a history of bilateral hip surgery for osteoarthritis ABI in our clinic was 0.94 on the left 12/8; deep laceration wound on the left medial mid tibia. We are using Iodoflex under compression. She is not eligible for home health 12/15; deep laceration wound on the left mid tibia. We are using Iodoflex under compression. Perhaps some minimal improvement in wound surface certainly no improvement in surface area 12/18; the patient called in to report drainage coming out of the wound and increasing pain. She was brought in for a nurse visit. Noted to have serosanguineous drainage and I was asked to look at this. 12/21; 3-day follow-up. Culture I did on 12/18 grew multiple organisms but none predominated. In spite of this the doxycycline really seems to have helped there is much less surrounding erythema. We allowed her to change the dressing over the weekend. The patient still  works at [employer] There is edema around the wound because we did not wrap her last time. I wanted her to be able to  continue to monitor the degree of erythema 12/28; the patient is not working and telling me she is staying off her foot is much as possible. She was approved for Apligraf but there was still too much debris over the wound surface today to consider this. Extensive debridement change dressing to Iodoflex 10/01/2019. The patient's wound is in a much better surface this week. We will order the Apligraf but continue Iodoflex for this week 1/11; healthy looking wound surface. Apligraf #1. No evidence of infection 1/25; wound down 1 cm in width and length. Apligraf #2 2/8; the wound is down in length and width. Apligraf #3 2/22; no real change in surface area however the wound certainly looks healthy. Apligraf #4 I will be looking an improvement in surface area next time will switch to an alternative dressing Electronic Signature(s) Signed: 11/19/2019 5:55:43 PM By: Linton Ham MD Entered By: Linton Ham on 11/19/2019 10:51:13 -------------------------------------------------------------------------------- Physical Exam Details Patient Name: Date of Service: Kathryn [name] 11/19/2019 10:30 AM Medical Record [id number] Patient Account Number: [id number] Date of Birth/Sex: Treating RN: 1931/05/13 (86 y.o. Kathryn [name] Primary Care Provider: Eliezer Lofts Other Clinician: Referring Provider: Treating Provider/Extender:Klani Caridi, [name], [name] in Treatment: 11 Notes Wound exam; her edema control is good. A slight amount of granulation after the aggressive removal last time. We will see if we can roll some ABDs to put some pressure on this. Apligraf #4 applied in the standard fashion Electronic Signature(s) Signed: 11/19/2019 5:55:43 PM By: Linton Ham MD Entered By: Linton Ham on 11/19/2019  10:52:12 -------------------------------------------------------------------------------- Physician Orders Details Patient Name: Date of Service: Kathryn [name] 11/19/2019 10:30 AM Medical Record [id number] Patient Account Number: [id number] Date of Birth/Sex: Treating RN: [birth year] (86 y.o. Kathryn [name] Primary Care Provider: Eliezer Lofts Other Clinician: Referring Provider: Treating Provider/Extender:Emaan Gary, [name], [name] in Treatment: 31 Verbal / Phone Orders: No Diagnosis Coding ICD-10 Coding Code Description I87.303 Chronic venous hypertension (idiopathic) without complications of bilateral lower extremity S81.812D Laceration without foreign body, left lower leg, subsequent encounter L97.222 Non-pressure chronic ulcer of left calf with fat layer exposed L03.116 Cellulitis of left lower limb Follow-up Appointments Return Appointment in 2 weeks. - MD Nurse Visit: - 1 week for rewrap Dressing Change Frequency Do not change entire dressing for one week. Skin Barriers/Peri-Wound Care Moisturizing lotion - to leg TCA Cream or Ointment - mixed with lotion Wound Cleansing May shower with protection. Primary Wound Dressing Wound #1 Left,Medial Lower Leg Skin Substitute Application - Apligraf #4 Wound #2 Left,Anterior Lower Leg Calcium Alginate Secondary Dressing Wound #1 Left,Medial Lower Leg Adaptic Dressing - secure with steri strips ABD pad - keep folded in half to provide extra pressure to area Carboflex Wound #2 Left,Anterior Lower Leg Dry Gauze Edema Control 3 Layer Compression System - Left Lower Extremity Avoid standing for long periods of time Elevate legs to the level of the heart or above for 30 minutes daily and/or when sitting, a frequency of: - throughout the day Exercise regularly Electronic Signature(s) Signed: 11/19/2019 5:55:43 PM By: Linton Ham MD Signed: 11/22/2019 8:56:01 AM By: Levan Hurst RN, BSN Entered By: Levan Hurst on 11/19/2019 10:49:27 -------------------------------------------------------------------------------- Problem List Details Patient Name: Date of Service: Kathryn [name] 11/19/2019 10:30 AM Medical Record [id number] Patient Account Number: [id number] Date of Birth/Sex: Treating RN: [birth year] (86 y.o. Kathryn [name] Primary Care Provider: Eliezer Lofts Other Clinician: Referring Provider: Treating Provider/Extender:Pollyann Roa, [name], [name] in Treatment: 11 Active Problems ICD-10 Evaluated Encounter Code Description  Active Date Today Diagnosis I87.303 Chronic venous hypertension (idiopathic) without 99991111 No Yes complications of bilateral lower extremity S81.812D Laceration without foreign body, left lower leg, 08/28/2019 No Yes subsequent encounter L97.222 Non-pressure chronic ulcer of left calf with fat layer 08/28/2019 No Yes exposed L03.116 Cellulitis of left lower limb 09/14/2019 No Yes Inactive Problems Resolved Problems Electronic Signature(s) Signed: 11/19/2019 5:55:43 PM By: Linton Ham MD Entered By: Linton Ham on 11/19/2019 10:50:19 -------------------------------------------------------------------------------- Progress Note Details Patient Name: Date of Service: Kathryn [name] 11/19/2019 10:30 AM Medical Record [id number] Patient Account Number: [id number] Date of Birth/Sex: Treating RN: 1931/01/28 (86 y.o. Kathryn [name] Primary Care Provider: Eliezer Lofts Other Clinician: Referring Provider: Treating Provider/Extender:Roby Donaway, [name], [name] in Treatment: 11 Subjective History of Present Illness (HPI) ADMISSION 08/28/19 This is an 86 year old woman who is still very active still working full-time at [employer] Sometime in late September her left calf was hit by the harness of her dog who was chasing another person. She was left with a fairly clear skin tear she tried to pull the skin back over the  wound but it it did not maintain. She was seen in her primary doctor's office on 07/20/2019 she was given a topical antibiotic and cephalexin. She was reviewed in primary care on 11/3 and it was felt the wound might need debridement and she was sent here. The patient has chronic venous insufficiency however she does not have a history of recurrent chronic wounds. Past medical history includes cellulitis of the left foot, hyperlipidemia, irregular heart rate, colonic polyps., Low back pain, B12 deficiency, Dupuytren's contractures, recurrent UTIs she has a history of bilateral hip surgery for osteoarthritis ABI in our clinic was 0.94 on the left 12/8; deep laceration wound on the left medial mid tibia. We are using Iodoflex under compression. She is not eligible for home health 12/15; deep laceration wound on the left mid tibia. We are using Iodoflex under compression. Perhaps some minimal improvement in wound surface certainly no improvement in surface area 12/18; the patient called in to report drainage coming out of the wound and increasing pain. She was brought in for a nurse visit. Noted to have serosanguineous drainage and I was asked to look at this. 12/21; 3-day follow-up. Culture I did on 12/18 grew multiple organisms but none predominated. In spite of this the doxycycline really seems to have helped there is much less surrounding erythema. We allowed her to change the dressing over the weekend. The patient still works at [employer] There is edema around the wound because we did not wrap her last time. I wanted her to be able to continue to monitor the degree of erythema 12/28; the patient is not working and telling me she is staying off her foot is much as possible. She was approved for Apligraf but there was still too much debris over the wound surface today to consider this. Extensive debridement change dressing to Iodoflex 10/01/2019. The patient's wound is in a much better surface  this week. We will order the Apligraf but continue Iodoflex for this week 1/11; healthy looking wound surface. Apligraf #1. No evidence of infection 1/25; wound down 1 cm in width and length. Apligraf #2 2/8; the wound is down in length and width. Apligraf #3 2/22; no real change in surface area however the wound certainly looks healthy. Apligraf #4 I will be looking an improvement in surface area next time will switch to an alternative dressing Objective Constitutional Vitals Time Taken: 10:30 AM, Height:  67 in, Weight: 125 lbs, BMI: 19.6, Temperature: 98.2 F, Pulse: 82 bpm, Respiratory Rate: 18 breaths/min, Blood Pressure: 145/75 mmHg. Integumentary (Hair, Skin) Wound #1 status is Open. Original cause of wound was Trauma. The wound is located on the Left,Medial Lower Leg. The wound measures 5.7cm length x 1.5cm width x 0.1cm depth; 6.715cm^2 area and 0.672cm^3 volume. There is Fat Layer (Subcutaneous Tissue) Exposed exposed. There is no tunneling or undermining noted. There is a medium amount of serosanguineous drainage noted. The wound margin is distinct with the outline attached to the wound base. There is large (67-100%) red, pink, hyper - granulation within the wound bed. There is a small (1-33%) amount of necrotic tissue within the wound bed including Adherent Slough. Wound #2 status is Open. Original cause of wound was Trauma. The wound is located on the Left,Anterior Lower Leg. The wound measures 0.5cm length x 0.5cm width x 0.1cm depth; 0.196cm^2 area and 0.02cm^3 volume. There is Fat Layer (Subcutaneous Tissue) Exposed exposed. There is no tunneling or undermining noted. There is a small amount of serosanguineous drainage noted. The wound margin is distinct with the outline attached to the wound base. There is large (67-100%) red, pink granulation within the wound bed. There is no necrotic tissue within the wound bed. Assessment Active Problems ICD-10 Chronic venous  hypertension (idiopathic) without complications of bilateral lower extremity Laceration without foreign body, left lower leg, subsequent encounter Non-pressure chronic ulcer of left calf with fat layer exposed Cellulitis of left lower limb Procedures Wound #1 Pre-procedure diagnosis of Wound #1 is a Venous Leg Ulcer located on the Left,Medial Lower Leg. A skin graft procedure using a bioengineered skin substitute/cellular or tissue based product was performed by Ricard Dillon., MD with the following instrument(s): N/A. Apligraf was applied and secured with Steri-Strips. 30 sq cm of product was utilized and 14 sq cm was wasted due to wound size. Post Application, abd pad, carboflex, compression wrap was applied. A Time Out was conducted at 10:45, prior to the start of the procedure. The procedure was tolerated well with a pain level of 0 throughout and a pain level of 0 following the procedure. Post procedure Diagnosis Wound #1: Same as Pre-Procedure . Pre-procedure diagnosis of Wound #1 is a Venous Leg Ulcer located on the Left,Medial Lower Leg . There was a Three Layer Compression Therapy Procedure by Levan Hurst, RN. Post procedure Diagnosis Wound #1: Same as Pre-Procedure Wound #2 Pre-procedure diagnosis of Wound #2 is an Abrasion located on the Left,Anterior Lower Leg . There was a Three Layer Compression Therapy Procedure by Levan Hurst, RN. Post procedure Diagnosis Wound #2: Same as Pre-Procedure Plan Follow-up Appointments: Return Appointment in 2 weeks. - MD Nurse Visit: - 1 week for rewrap Dressing Change Frequency: Do not change entire dressing for one week. Skin Barriers/Peri-Wound Care: Moisturizing lotion - to leg TCA Cream or Ointment - mixed with lotion Wound Cleansing: May shower with protection. Primary Wound Dressing: Wound #1 Left,Medial Lower Leg: Skin Substitute Application - Apligraf #4 Wound #2 Left,Anterior Lower Leg: Calcium Alginate Secondary  Dressing: Wound #1 Left,Medial Lower Leg: Adaptic Dressing - secure with steri strips ABD pad - keep folded in half to provide extra pressure to area Carboflex Wound #2 Left,Anterior Lower Leg: Dry Gauze Edema Control: 3 Layer Compression System - Left Lower Extremity Avoid standing for long periods of time Elevate legs to the level of the heart or above for 30 minutes daily and/or when sitting, a frequency of: - throughout the  day Exercise regularly 1. Apligraf #4 in the standard fashion 2. If the dimensions are not any different next time I am going to change dressings perhaps Hydrofera Blue. This has no depth and there are rims of epithelialization Electronic Signature(s) Signed: 11/19/2019 5:55:43 PM By: Linton Ham MD Entered By: Linton Ham on 11/19/2019 10:54:25 -------------------------------------------------------------------------------- SuperBill Details Patient Name: Date of Service: Kathryn [name] 11/19/2019 Medical Record [id number] Patient Account Number: [id number] Date of Birth/Sex: Treating RN: 1931-08-26 (86 y.o. Kathryn [name] Primary Care Provider: Eliezer Lofts Other Clinician: Referring Provider: Treating Provider/Extender:Vernia Teem, [name], [name] in Treatment: 11 Diagnosis Coding ICD-10 Codes Code Description I87.303 Chronic venous hypertension (idiopathic) without complications of bilateral lower extremity S81.812D Laceration without foreign body, left lower leg, subsequent encounter L97.222 Non-pressure chronic ulcer of left calf with fat layer exposed L03.116 Cellulitis of left lower limb Facility Procedures CPT4 Code Description: [id number] (Facility Use Only) Apligraf 1 SQ CM Modifier: Quantity: 86 CPT4 Code Description: [id number] - SKIN SUB GRAFT TRNK/ARM/LEG ICD-10 Diagnosis Description I87.303 Chronic venous hypertension (idiopathic) without complicati extremity [id number] Non-pressure chronic ulcer of left calf with  fat layer expo Modifier: ons of bilatera sed Quantity: 1 l lower Physician Procedures Electronic Signature(s) Signed: 11/19/2019 5:55:43 PM By: Linton Ham MD Entered By: Linton Ham on 11/19/2019 10:54:46

## 2019-11-22 NOTE — Progress Notes (Signed)
CARIDAD, SILVEIRA ([id number]) Visit Report for 11/19/2019 Arrival Information Details Patient Name: Date of Service: Kathryn [name], Kathryn [name] 11/19/2019 10:30 AM Medical Record [id number] Patient Account Number: [id number] Date of Birth/Sex: Treating RN: 14-Sep-1931 (86 y.o. [name] Primary Care Khaliyah Northrop: Eliezer Lofts Other Clinician: Referring Blia Totman: Treating Shauni Henner/Extender:Robson, [name], [name] in Treatment: 11 Visit Information History Since Last Visit Added or deleted any medications: No Patient Arrived: Ambulatory Any new allergies or adverse reactions: No Arrival Time: 10:29 Had a fall or experienced change in No Accompanied By: friend activities of daily living that may affect Transfer Assistance: Kathryn [name] risk of falls: Patient Identification Verified: Yes Signs or symptoms of abuse/neglect since last No Secondary Verification Process Completed: Yes visito Patient Requires Transmission-Based No Hospitalized since last visit: No Precautions: Implantable device outside of the clinic excluding No Patient Has Alerts: No cellular tissue based products placed in the center since last visit: Has Dressing in Place as Prescribed: Yes Has Compression in Place as Prescribed: Yes Pain Present Now: No Electronic Signature(s) Signed: 11/19/2019 1:40:10 PM By: Kela Millin Entered By: Kela Millin on 11/19/2019 10:30:12 -------------------------------------------------------------------------------- Compression Therapy Details Patient Name: Date of Service: Kathryn [name] 11/19/2019 10:30 AM Medical Record [id number] Patient Account Number: [id number] Date of Birth/Sex: Treating RN: 08-25-1931 (86 y.o. [name] Primary Care Mick Tanguma: Eliezer Lofts Other Clinician: Referring Aksh Swart: Treating Omair Dettmer/Extender:Robson, [name], [name] in Treatment: 11 Compression Therapy Performed for Wound Wound #1 Left,Medial Lower  Leg Assessment: Performed By: Clinician Levan Hurst, RN Compression Type: Three Layer Post Procedure Diagnosis Same as Pre-procedure Electronic Signature(s) Signed: 11/22/2019 8:56:01 AM By: Levan Hurst RN, BSN Entered By: Levan Hurst on 11/19/2019 10:48:39 -------------------------------------------------------------------------------- Compression Therapy Details Patient Name: Date of Service: Kathryn [name] 11/19/2019 10:30 AM Medical Record [id number] Patient Account Number: [id number] Date of Birth/Sex: Treating RN: 09-04-1931 (86 y.o. [name] Primary Care Deneen Slager: Eliezer Lofts Other Clinician: Referring Miloh Alcocer: Treating Sabeen Piechocki/Extender:Robson, [name], [name] in Treatment: 11 Compression Therapy Performed for Wound Wound #2 Left,Anterior Lower Leg Assessment: Performed By: Clinician Levan Hurst, RN Compression Type: Three Layer Post Procedure Diagnosis Same as Pre-procedure Electronic Signature(s) Signed: 11/22/2019 8:56:01 AM By: Levan Hurst RN, BSN Entered By: Levan Hurst on 11/19/2019 10:48:39 -------------------------------------------------------------------------------- Encounter Discharge Information Details Patient Name: Date of Service: Kathryn [name] 11/19/2019 10:30 AM Medical Record [id number] Patient Account Number: [id number] Date of Birth/Sex: Treating RN: 11/16/1930 (86 y.o. [name] Primary Care Quenisha Lovins: Eliezer Lofts Other Clinician: Referring Kelcey Wickstrom: Treating Vada Swift/Extender:Robson, [name], [name] in Treatment: 11 Encounter Discharge Information Items Post Procedure Vitals Discharge Condition: Stable Temperature (F): 98.2 Ambulatory Status: Ambulatory Pulse (bpm): 82 Discharge Destination: Home Respiratory Rate (breaths/min): 18 Transportation: Private Auto Blood Pressure (mmHg): 145/75 Accompanied By: friend Schedule Follow-up Appointment: Yes Clinical Summary of  Care: Electronic Signature(s) Signed: 11/19/2019 5:30:41 PM By: Deon Pilling Entered By: Deon Pilling on 11/19/2019 11:02:33 -------------------------------------------------------------------------------- Lower Extremity Assessment Details Patient Name: Date of Service: Kathryn [name], Kathryn [name] 11/19/2019 10:30 AM Medical Record [id number] Patient Account Number: [id number] Date of Birth/Sex: Treating RN: [birth year] (86 y.o. [name] Primary Care Darol Cush: Eliezer Lofts Other Clinician: Referring Valaria Kohut: Treating Erilyn Pearman/Extender:Robson, [name], [name] in Treatment: 11 Edema Assessment Assessed: [Left: No] [Right: No] Edema: [Left: N] [Right: o] Calf Left: Right: Point of Measurement: cm From Medial Instep 30 cm cm Ankle Left: Right: Point of Measurement: cm From Medial Instep 20.5 cm cm Vascular Assessment Pulses: Dorsalis Pedis Palpable: [Left:Yes] Electronic Signature(s) Signed: 11/19/2019 1:40:10  PM By: Kela Millin Entered By: Kela Millin on 11/19/2019 10:31:22 -------------------------------------------------------------------------------- Multi Wound Chart Details Patient Name: Date of Service: Kathryn [name], Kathryn [name] 11/19/2019 10:30 AM Medical Record [id number] Patient Account Number: [id number] Date of Birth/Sex: Treating RN: [birth year] (86 y.o. [name] Primary Care Mai Longnecker: Eliezer Lofts Other Clinician: Referring Alexya Mcdaris: Treating Hulda Reddix/Extender:Robson, [name], [name] in Treatment: 11 Vital Signs Height(in): 67 Pulse(bpm): 82 Weight(lbs): 125 Blood Pressure(mmHg): 145/75 Body Mass Index(BMI): 20 Temperature(F): 98.2 Respiratory 18 Rate(breaths/min): Photos: [1:No Photos] [2:No Photos] [N/A:N/A] Wound Location: [1:Left, Medial Lower Leg] [2:Left, Anterior Lower Leg] [N/A:N/A] Wounding Event: [1:Trauma] [2:Trauma] [N/A:N/A] Primary Etiology: [1:Venous Leg Ulcer] [2:Abrasion] [N/A:N/A] Comorbid  History: [1:Cataracts, Osteoarthritis, Cataracts, Osteoarthritis, N/A Confinement Anxiety] [2:Confinement Anxiety] Date Acquired: [1:05/29/2019] [2:11/13/2019] [N/A:N/A] Weeks of Treatment: [1:11] [2:0] [N/A:N/A] Wound Status: [1:Open] [2:Open] [N/A:N/A] Measurements L x W x D 5.7x1.5x0.1 [2:0.5x0.5x0.1] [N/A:N/A] (cm) Area (cm) : [1:6.715] [2:0.196] [N/A:N/A] Volume (cm) : [1:0.672] [2:0.02] [N/A:N/A] % Reduction in Area: [1:32.80%] [2:0.00%] [N/A:N/A] % Reduction in Volume: 77.60% [2:0.00%] [N/A:N/A] Classification: [1:Full Thickness Without Exposed Support Structures Exposed Support Structures] [2:Full Thickness Without] [N/A:N/A] Exudate Amount: [1:Medium] [2:Small] [N/A:N/A] Exudate Type: [1:Serosanguineous] [2:Serosanguineous] [N/A:N/A] Exudate Color: [1:red, brown] [2:red, brown] [N/A:N/A] Wound Margin: [1:Distinct, outline attached Distinct, outline attached N/A] Granulation Amount: [1:Large (67-100%)] [2:Large (67-100%)] [N/A:N/A] Granulation Quality: [1:Red, Pink, Hyper- granulation] [2:Red, Pink] [N/A:N/A] Necrotic Amount: [1:Small (1-33%)] [2:Kathryn [name] Present (0%)] [N/A:N/A] Exposed Structures: [1:Fat Layer (Subcutaneous Fat Layer (Subcutaneous N/A Tissue) Exposed: Yes Fascia: No Tendon: No Muscle: No Joint: No Bone: No] [2:Tissue) Exposed: Yes Fascia: No Tendon: No Muscle: No Joint: No Bone: No] Epithelialization: [1:Small (1-33%)] [2:Kathryn [name]] [N/A:N/A] Procedures Performed: Cellular or Tissue Based Compression Therapy [1:Product Compression Therapy] [N/A:N/A] Treatment Notes Electronic Signature(s) Signed: 11/19/2019 5:55:43 PM By: Linton Ham MD Signed: 11/22/2019 8:56:01 AM By: Levan Hurst RN, BSN Entered By: Linton Ham on 11/19/2019 10:50:25 -------------------------------------------------------------------------------- Multi-Disciplinary Care Plan Details Patient Name: Date of Service: Kathryn [name] 11/19/2019 10:30 AM Medical Record [id number] Patient  Account Number: [id number] Date of Birth/Sex: Treating RN: 1931/06/04 (86 y.o. [name] Primary Care Aqib Lough: Eliezer Lofts Other Clinician: Referring Damontre Millea: Treating Aubrey Voong/Extender:Robson, [name], [name] in Treatment: 11 Active Inactive Wound/Skin Impairment Nursing Diagnoses: Knowledge deficit related to ulceration/compromised skin integrity Goals: Patient/caregiver will verbalize understanding of skin care regimen Date Initiated: 08/28/2019 Target Resolution Date: 12/21/2019 Goal Status: Active Ulcer/skin breakdown will have a volume reduction of 30% by week 4 Date Initiated: 08/28/2019 Date Inactivated: 10/01/2019 Target Resolution Date: 10/05/2019 Goal Status: Met Ulcer/skin breakdown will have a volume reduction of 50% by week 8 Date Initiated: 10/01/2019 Date Inactivated: 11/19/2019 Target Resolution Date: 11/02/2019 Goal Status: Met Interventions: Assess patient/caregiver ability to obtain necessary supplies Assess patient/caregiver ability to perform ulcer/skin care regimen upon admission and as needed Assess ulceration(s) every visit Notes: Electronic Signature(s) Signed: 11/22/2019 8:56:01 AM By: Levan Hurst RN, BSN Entered By: Levan Hurst on 11/19/2019 13:55:10 -------------------------------------------------------------------------------- Pain Assessment Details Patient Name: Date of Service: Kathryn [name] 11/19/2019 10:30 AM Medical Record [id number] Patient Account Number: [id number] Date of Birth/Sex: Treating RN: 28-Jan-1931 (86 y.o. [name] Primary Care Binh Doten: Eliezer Lofts Other Clinician: Referring Ireland Chagnon: Treating Assunta Pupo/Extender:Robson, [name], [name] in Treatment: 11 Active Problems Location of Pain Severity and Description of Pain Patient Has Paino No Site Locations Pain Management and Medication Current Pain Management: Electronic Signature(s) Signed: 11/19/2019 1:40:10 PM By:  Kela Millin Entered By: Kela Millin on 11/19/2019 10:30:42 -------------------------------------------------------------------------------- Patient/Caregiver Education Details Patient Name: Date of Service: Kathryn [name]  [place] [facility]. 2/22/2021andnbsp10:30 AM Medical Record [id number] Patient Account Number: [id number] Date of Birth/Gender: 04-24-1931 (86 y.o. F) Treating RN: Levan Hurst Primary Care Physician: Eliezer Lofts Other Clinician: Referring Physician: Treating Physician/Extender:Robson, [name], [name] in Treatment: 11 Education Assessment Education Provided To: Patient Education Topics Provided Wound/Skin Impairment: Methods: Explain/Verbal Responses: State content correctly Electronic Signature(s) Signed: 11/22/2019 8:56:01 AM By: Levan Hurst RN, BSN Entered By: Levan Hurst on 11/19/2019 13:55:21 -------------------------------------------------------------------------------- Wound Assessment Details Patient Name: Date of Service: Kathryn [name] 11/19/2019 10:30 AM Medical Record [id number] Patient Account Number: [id number] Date of Birth/Sex: Treating RN: Jul 17, 1931 (86 y.o. [name] Primary Care Merry Pond: Eliezer Lofts Other Clinician: Referring Larri Brewton: Treating Arnett Duddy/Extender:Robson, [name], [name] in Treatment: 11 Wound Status Wound Number: 1 Primary Venous Leg Ulcer Etiology: Wound Location: Left Lower Leg - Medial Wound Status: Open Wounding Event: Trauma Comorbid Cataracts, Osteoarthritis, Confinement Date Acquired: 05/29/2019 History: Anxiety Weeks Of Treatment: 11 Clustered Wound: No Photos Wound Measurements Length: (cm) 5.7 % Reduct Width: (cm) 1.5 % Reduct Depth: (cm) 0.1 Epitheli Area: (cm) 6.715 Tunneli Volume: (cm) 0.672 Undermi Wound Description Classification: Full Thickness Without Exposed Support Foul Od Structures Slough/ Wound Distinct, outline  attached Margin: Exudate Medium Amount: Exudate Serosanguineous Type: Exudate red, brown Color: Wound Bed Granulation Amount: Large (67-100%) Granulation Quality: Red, Pink, Hyper-granulation Fascia Necrotic Amount: Small (1-33%) Fat Lay Necrotic Quality: Adherent Slough Tendon Muscle Join Bone or After Cleansing: No Fibrino Yes Exposed Structure Exposed: No er (Subcutaneous Tissue) Exposed: Yes Exposed: No Exposed: No t Exposed: No Exposed: No ion in Area: 32.8% ion in Volume: 77.6% alization: Small (1-33%) ng: No ning: No Treatment Notes Wound #1 (Left, Medial Lower Leg) 1. Cleanse With Wound Cleanser Soap and water 2. Periwound Care Moisturizing lotion TCA Ointment 3. Primary Dressing Applied Cellular Based Tissue Product Other primary dressing (specifiy in notes) 4. Secondary Dressing ABD Pad Other secondary dressing (specify in notes) 6. Support Layer Applied 3 layer compression wrap Notes applied by MD apligraf, adaptic, and steri-strips, carboflex as secondary. netting. Electronic Signature(s) Signed: 11/19/2019 4:33:06 PM By: Mikeal Hawthorne EMT/HBOT Signed: 11/19/2019 5:29:42 PM By: Kela Millin Previous Signature: 11/19/2019 1:40:10 PM Version By: Kela Millin Entered By: Mikeal Hawthorne on 11/19/2019 14:29:54 -------------------------------------------------------------------------------- Wound Assessment Details Patient Name: Date of Service: Kathryn [name] 11/19/2019 10:30 AM Medical Record [id number] Patient Account Number: [id number] Date of Birth/Sex: Treating RN: 1930-12-02 (85 y.o. [name] Primary Care Mikael Debell: Eliezer Lofts Other Clinician: Referring Timon Geissinger: Treating Zetta Stoneman/Extender:Robson, [name], [name] in Treatment: 11 Wound Status Wound Number: 2 Primary Abrasion Etiology: Wound Location: Left Lower Leg - Anterior Wound Status: Open Wounding Event: Trauma Comorbid Cataracts,  Osteoarthritis, Confinement Date Acquired: 11/13/2019 History: Anxiety Weeks Of Treatment: 0 Clustered Wound: No Photos Wound Measurements Length: (cm) 0.5 % Reduct Width: (cm) 0.5 % Reduct Depth: (cm) 0.1 Epitheli Area: (cm) 0.196 Tunneli Volume: (cm) 0.02 Undermi Wound Description Classification: Full Thickness Without Exposed Support Foul Odo Structures Slough/F Wound Distinct, outline attached Margin: Exudate Small Amount: Exudate Serosanguineous Type: Exudate red, brown Color: Wound Bed Granulation Amount: Large (67-100%) Granulation Quality: Red, Pink Fascia E Necrotic Amount: Kathryn [name] Present (0%) Fat Laye Tendon E Muscle E Joint Ex Bone Exp r After Cleansing: No ibrino No Exposed Structure xposed: No r (Subcutaneous Tissue) Exposed: Yes xposed: No xposed: No posed: No osed: No ion in Area: 0% ion in Volume: 0% alization: Kathryn [name] ng: No ning: No Treatment Notes Wound #2 (Left, Anterior Lower Leg) 1. Cleanse With  Wound Cleanser Soap and water 2. Periwound Care Moisturizing lotion TCA Ointment 3. Primary Dressing Applied Calcium Alginate 4. Secondary Dressing Dry Gauze 6. Support Layer Applied 3 layer compression wrap Notes netting. Electronic Signature(s) Signed: 11/19/2019 4:33:06 PM By: Mikeal Hawthorne EMT/HBOT Signed: 11/19/2019 5:29:42 PM By: Kela Millin Previous Signature: 11/19/2019 1:40:10 PM Version By: Kela Millin Entered By: Mikeal Hawthorne on 11/19/2019 14:30:36 -------------------------------------------------------------------------------- Vitals Details Patient Name: Date of Service: Kathryn [name] 11/19/2019 10:30 AM Medical Record [id number] Patient Account Number: [id number] Date of Birth/Sex: Treating RN: 01/10/1931 (86 y.o. [name] Primary Care Emoree Sasaki: Eliezer Lofts Other Clinician: Referring Shubham Thackston: Treating Ramsie Ostrander/Extender:Robson, [name], [name] in Treatment: 11 Vital  Signs Time Taken: 10:30 Temperature (F): 98.2 Height (in): 67 Pulse (bpm): 82 Weight (lbs): 125 Respiratory Rate (breaths/min): 18 Body Mass Index (BMI): 19.6 Blood Pressure (mmHg): 145/75 Reference Range: 80 - 120 mg / dl Electronic Signature(s) Signed: 11/19/2019 1:40:10 PM By: Kela Millin Entered By: Kela Millin on 11/19/2019 10:30:35

## 2019-11-26 ENCOUNTER — Other Ambulatory Visit: Payer: Self-pay

## 2019-11-26 ENCOUNTER — Encounter (HOSPITAL_BASED_OUTPATIENT_CLINIC_OR_DEPARTMENT_OTHER): Payer: Medicare Other | Attending: Internal Medicine | Admitting: Internal Medicine

## 2019-11-26 DIAGNOSIS — Z09 Encounter for follow-up examination after completed treatment for conditions other than malignant neoplasm: Secondary | ICD-10-CM | POA: Diagnosis not present

## 2019-11-26 DIAGNOSIS — I872 Venous insufficiency (chronic) (peripheral): Secondary | ICD-10-CM | POA: Insufficient documentation

## 2019-11-27 NOTE — Progress Notes (Signed)
Kathryn [name], Kathryn [name] ([id number]) Visit Report for 11/26/2019 SuperBill Details Patient Name: Date of Service: Kathryn [name], Kathryn [name] 11/26/2019 Medical Record [id number] Patient Account Number: [id number] Date of Birth/Sex: Treating RN: 07-Feb-1931 (86 y.o. Orvan Falconer Primary Care Provider: Eliezer Lofts Other Clinician: Referring Provider: Treating Provider/Extender:Moroni Nester, [name], [name] in Treatment: 12 Diagnosis Coding ICD-10 Codes Code Description I87.303 Chronic venous hypertension (idiopathic) without complications of bilateral lower extremity S81.812D Laceration without foreign body, left lower leg, subsequent encounter L97.222 Non-pressure chronic ulcer of left calf with fat layer exposed L03.116 Cellulitis of left lower limb Facility Procedures CPT4 Code Description Modifier Quantity [id number] (Facility Use Only) [phone] - [facility] Electronic Signature(s) Signed: 11/26/2019 5:45:55 PM By: Linton Ham MD Signed: 11/27/2019 9:14:02 AM By: Carlene Coria RN Entered By: Carlene Coria on 11/26/2019 11:43:19

## 2019-11-27 NOTE — Progress Notes (Signed)
KIERSTA, MECHLING ([id number]) Visit Report for 11/26/2019 Arrival Information Details Patient Name: Date of Service: DEVINEE, TING 11/26/2019 11:15 AM Medical Record [id number] Patient Account Number: [id number] Date of Birth/Sex: Treating RN: 30-Dec-1930 (86 y.o. Orvan Falconer Primary Care Annison Birchard: Eliezer Lofts Other Clinician: Referring Omid Deardorff: Treating Johnatan Baskette/Extender:Robson, [name], [name] in Treatment: 12 Visit Information History Since Last Visit All ordered tests and consults were completed: No Patient Arrived: Ambulatory Added or deleted any medications: No Arrival Time: 11:37 Any new allergies or adverse reactions: No Accompanied By: self Had a fall or experienced change in No Transfer Assistance: None activities of daily living that may affect Patient Identification Verified: Yes risk of falls: Secondary Verification Process Yes Signs or symptoms of abuse/neglect since last No Completed: visito Patient Requires Transmission-Based No Hospitalized since last visit: No Precautions: Implantable device outside of the clinic excluding No Patient Has Alerts: No cellular tissue based products placed in the center since last visit: Has Dressing in Place as Prescribed: Yes Has Compression in Place as Prescribed: Yes Pain Present Now: No Electronic Signature(s) Signed: 11/27/2019 9:14:02 AM By: Carlene Coria RN Entered By: Carlene Coria on 11/26/2019 11:38:32 -------------------------------------------------------------------------------- Compression Therapy Details Patient Name: Date of Service: JEALOUSY, RASHED 11/26/2019 11:15 AM Medical Record [id number] Patient Account Number: [id number] Date of Birth/Sex: Treating RN: April 01, 1931 (86 y.o. Orvan Falconer Primary Care Mitzie Marlar: Eliezer Lofts Other Clinician: Referring Sorayah Schrodt: Treating Kadesha Virrueta/Extender:Robson, [name], [name] in Treatment: 12 Compression Therapy Performed for  Wound Wound #1 Left,Medial Lower Leg Assessment: Performed By: Clinician Carlene Coria, RN Compression Type: Three Layer Electronic Signature(s) Signed: 11/27/2019 9:14:02 AM By: Carlene Coria RN Entered By: Carlene Coria on 11/26/2019 11:40:30 -------------------------------------------------------------------------------- Compression Therapy Details Patient Name: Date of Service: DELANA, MOLOCK 11/26/2019 11:15 AM Medical Record [id number] Patient Account Number: [id number] Date of Birth/Sex: Treating RN: 05-03-1931 (86 y.o. Orvan Falconer Primary Care Krystena Reitter: Eliezer Lofts Other Clinician: Referring Taquila Leys: Treating Marcelyn Ruppe/Extender:Robson, [name], [name] in Treatment: 12 Compression Therapy Performed for Wound Wound #2 Left,Anterior Lower Leg Assessment: Performed By: Clinician Carlene Coria, RN Compression Type: Three Layer Electronic Signature(s) Signed: 11/27/2019 9:14:02 AM By: Carlene Coria RN Entered By: Carlene Coria on 11/26/2019 11:40:31 -------------------------------------------------------------------------------- Encounter Discharge Information Details Patient Name: Date of Service: EVANN, ALDANA 11/26/2019 11:15 AM Medical Record [id number] Patient Account Number: [id number] Date of Birth/Sex: Treating RN: 03-01-1931 (86 y.o. Orvan Falconer Primary Care Climmie Cronce: Eliezer Lofts Other Clinician: Referring Matha Masse: Treating Bertrice Leder/Extender:Robson, [name], [name] in Treatment: 12 Encounter Discharge Information Items Discharge Condition: Stable Ambulatory Status: Ambulatory Discharge Destination: Home Transportation: Private Auto Accompanied By: self Schedule Follow-up Appointment: Yes Clinical Summary of Care: Patient Declined Electronic Signature(s) Signed: 11/27/2019 9:14:02 AM By: Carlene Coria RN Entered By: Carlene Coria on 11/26/2019  11:43:07 -------------------------------------------------------------------------------- Patient/Caregiver Education Details Patient Name: Date of Service: [name] 3/1/2021andnbsp11:15 AM Medical Record [id number] Patient Account Number: [id number] Date of Birth/Gender: 10-Sep-1931 (86 y.o. F) Treating RN: Carlene Coria Primary Care Physician: Eliezer Lofts Other Clinician: Referring Physician: Treating Physician/Extender:Robson, [name], [name] in Treatment: 12 Education Assessment Education Provided To: Patient Education Topics Provided Wound/Skin Impairment: Methods: Explain/Verbal Responses: State content correctly Electronic Signature(s) Signed: 11/27/2019 9:14:02 AM By: Carlene Coria RN Entered By: Carlene Coria on 11/26/2019 11:42:52 -------------------------------------------------------------------------------- Wound Assessment Details Patient Name: Date of Service: MYSTIE, DELHIERRO 11/26/2019 11:15 AM Medical Record [id number] Patient Account Number: [id number] Date of Birth/Sex: Treating RN: 03/13/1931 (86 y.o. Orvan Falconer Primary Care Lorin Gawron: Diona Browner,  Amy Other Clinician: Referring Celso Granja: Treating Yanni Quiroa/Extender:Robson, [name], [name] in Treatment: 12 Wound Status Wound Number: 1 Primary Venous Leg Ulcer Etiology: Wound Location: Left Lower Leg - Medial Wound Status: Open Wounding Event: Trauma Comorbid Cataracts, Osteoarthritis, Confinement Date Acquired: 05/29/2019 History: Anxiety Weeks Of Treatment: 12 Clustered Wound: No Wound Measurements Length: (cm) 5.7 % Reduction Width: (cm) 1.5 % Red Depth: (cm) 0.1 Epith Area: (cm) 6.715 Tunn Volume: (cm) 0.672 Unde Wound Description Full Thickness Without Exposed Support Classification: Structures Wound Distinct, outline attached Margin: Exudate Medium Amount: Exudate Serosanguineous Type: Exudate red, brown Color: Wound Bed Granulation Amount:  Large (67-100%) Granulation Quality: Red, Pink, Hyper-granulation Necrotic Amount: Small (1-33%) Necrotic Quality: Adherent Slough Foul Odor After Cleansing: No Slough/Fibrino Yes Exposed Structure Fascia Exposed: No Fat Layer (Subcutaneous Tissue) Exposed: Yes Tendon Exposed: No Muscle Exposed: No Joint Exposed: No Bone Exposed: No in Area: 32.8% uction in Volume: 77.6% elialization: Small (1-33%) eling: No rmining: No Treatment Notes Wound #1 (Left, Medial Lower Leg) 1. Cleanse With Wound Cleanser Soap and water 2. Periwound Care TCA Cream 3. Primary Dressing Applied Calcium Alginate Ag Dry Gauze 4. Secondary Dressing ABD Pad 6. Support Layer Applied 3 layer compression wrap Notes applied by MD apligraf, adaptic, and steri-strips, carboflex as secondary. netting. Electronic Signature(s) Signed: 11/27/2019 9:14:02 AM By: Carlene Coria RN Entered By: Carlene Coria on 11/26/2019 11:39:33 -------------------------------------------------------------------------------- Wound Assessment Details Patient Name: Date of Service: LUNABELLA, DISALVATORE 11/26/2019 11:15 AM Medical Record [id number] Patient Account Number: [id number] Date of Birth/Sex: Treating RN: [birth year] (86 y.o. Orvan Falconer Primary Care Norvin Ohlin: Other Clinician: Eliezer Lofts Referring Ondrea Dow: Treating Thy Gullikson/Extender:Robson, [name], [name] in Treatment: 12 Wound Status Wound Number: 2 Primary Abrasion Etiology: Wound Location: Left Lower Leg - Anterior Wound Status: Open Wounding Event: Trauma Comorbid Cataracts, Osteoarthritis, Confinement Date Acquired: 11/13/2019 History: Anxiety Weeks Of Treatment: 1 Clustered Wound: No Wound Measurements Length: (cm) 0.5 % Reduct Width: (cm) 0.5 % Reduct Depth: (cm) 0.1 Epitheli Area: (cm) 0.196 Tunneli Volume: (cm) 0.02 Undermi Wound Description Full Thickness Without Exposed Support Foul Od Classification:  Structures Slough/ Wound Distinct, outline attached Margin: Exudate Small Amount: Exudate Serosanguineous Type: Exudate red, brown Color: Wound Bed Granulation Amount: Large (67-100%) Granulation Quality: Red, Pink Fascia E Necrotic Amount: None Present (0%) Fat Laye Tendon E Muscle E Joint Ex Bone Exp or After Cleansing: No Fibrino No Exposed Structure xposed: No r (Subcutaneous Tissue) Exposed: Yes xposed: No xposed: No posed: No osed: No ion in Area: 0% ion in Volume: 0% alization: None ng: No ning: No Electronic Signature(s) Signed: 11/27/2019 9:14:02 AM By: Carlene Coria RN Entered By: Carlene Coria on 11/26/2019 11:39:55 -------------------------------------------------------------------------------- Vitals Details Patient Name: Date of Service: [name] 11/26/2019 11:15 AM Medical Record [id number] Patient Account Number: [id number] Date of Birth/Sex: Treating RN: [birth year] (86 y.o. Orvan Falconer Primary Care Derik Fults: Eliezer Lofts Other Clinician: Referring Prentiss Polio: Treating Majel Giel/Extender:Robson, [name], [name] in Treatment: 12 Vital Signs Time Taken: 11:38 Temperature (F): 98.1 Height (in): 67 Pulse (bpm): 88 Weight (lbs): 125 Respiratory Rate (breaths/min): 18 Body Mass Index (BMI): 19.6 Blood Pressure (mmHg): 154/90 Reference Range: 80 - 120 mg / dl Electronic Signature(s) Signed: 11/27/2019 9:14:02 AM By: Carlene Coria RN Entered By: Carlene Coria on 11/26/2019 11:39:04

## 2019-12-03 ENCOUNTER — Encounter (HOSPITAL_BASED_OUTPATIENT_CLINIC_OR_DEPARTMENT_OTHER): Payer: Medicare Other | Attending: Internal Medicine | Admitting: Internal Medicine

## 2019-12-03 DIAGNOSIS — I872 Venous insufficiency (chronic) (peripheral): Secondary | ICD-10-CM | POA: Diagnosis not present

## 2019-12-03 DIAGNOSIS — W228XXA Striking against or struck by other objects, initial encounter: Secondary | ICD-10-CM | POA: Diagnosis not present

## 2019-12-03 DIAGNOSIS — I87312 Chronic venous hypertension (idiopathic) with ulcer of left lower extremity: Secondary | ICD-10-CM | POA: Diagnosis not present

## 2019-12-03 DIAGNOSIS — L97222 Non-pressure chronic ulcer of left calf with fat layer exposed: Secondary | ICD-10-CM | POA: Diagnosis not present

## 2019-12-03 DIAGNOSIS — I87303 Chronic venous hypertension (idiopathic) without complications of bilateral lower extremity: Secondary | ICD-10-CM | POA: Diagnosis not present

## 2019-12-03 DIAGNOSIS — L97229 Non-pressure chronic ulcer of left calf with unspecified severity: Secondary | ICD-10-CM | POA: Diagnosis not present

## 2019-12-03 DIAGNOSIS — S81812A Laceration without foreign body, left lower leg, initial encounter: Secondary | ICD-10-CM | POA: Diagnosis not present

## 2019-12-03 DIAGNOSIS — L03116 Cellulitis of left lower limb: Secondary | ICD-10-CM | POA: Insufficient documentation

## 2019-12-04 NOTE — Progress Notes (Signed)
Kathryn [name], Kathryn [name] ([id number]) Visit Report for 12/03/2019 HPI Details Patient Name: Date of Service: Kathryn [name], Kathryn [name] 12/03/2019 10:30 AM Medical Record [id number] Patient Account Number: [id number] Date of Birth/Sex: Treating RN: [birth year] (86 y.o. Kathryn [name] Primary Care Provider: Eliezer [name] Other Clinician: Referring Provider: Treating Provider/Extender:Kathryn [name], Kathryn [name] in Treatment: 13 History of Present Illness HPI Description: ADMISSION 08/28/19 This is an 86 year old woman who is still very active still working full-time at [employer] Sometime in late September her left calf was hit by the harness of her dog who was chasing another person. She was left with a fairly clear skin tear she tried to pull the skin back over the wound but it it did not maintain. She was seen in her primary doctor's office on 07/20/2019 she was given a topical antibiotic and cephalexin. She was reviewed in primary care on 11/3 and it was felt the wound might need debridement and she was sent here. The patient has chronic venous insufficiency however she does not have a history of recurrent chronic wounds. Past medical history includes cellulitis of the left foot, hyperlipidemia, irregular heart rate, colonic polyps., Low back pain, B12 deficiency, Dupuytren's contractures, recurrent UTIs she has a history of bilateral hip surgery for osteoarthritis ABI in our clinic was 0.94 on the left 12/8; deep laceration wound on the left medial mid tibia. We are using Iodoflex under compression. She is not eligible for home health 12/15; deep laceration wound on the left mid tibia. We are using Iodoflex under compression. Perhaps some minimal improvement in wound surface certainly no improvement in surface area 12/18; the patient called in to report drainage coming out of the wound and increasing pain. She was brought in for a nurse visit. Noted to have serosanguineous drainage and I was  asked to look at this. 12/21; 3-day follow-up. Culture I did on 12/18 grew multiple organisms but none predominated. In spite of this the doxycycline really seems to have helped there is much less surrounding erythema. We allowed her to change the dressing over the weekend. The patient still works at [employer] There is edema around the wound because we did not wrap her last time. I wanted her to be able to continue to monitor the degree of erythema 12/28; the patient is not working and telling me she is staying off her foot is much as possible. She was approved for Apligraf but there was still too much debris over the wound surface today to consider this. Extensive debridement change dressing to Iodoflex 10/01/2019. The patient's wound is in a much better surface this week. We will order the Apligraf but continue Iodoflex for this week 1/11; healthy looking wound surface. Apligraf #1. No evidence of infection 1/25; wound down 1 cm in width and length. Apligraf #2 2/8; the wound is down in length and width. Apligraf #3 2/22; no real change in surface area however the wound certainly looks healthy. Apligraf #4 I will be looking an improvement in surface area next time will switch to an alternative dressing 3/8; the patient's wound is entirely epithelialized. Somewhat raised area either we epithelialized over hyper granulation or this is scar tissue. Nevertheless the area is fragile. Electronic Signature(s) Signed: 12/03/2019 5:29:15 PM By: Kathryn [name] Entered By: Kathryn [name] on 12/03/2019 12:41:45 -------------------------------------------------------------------------------- Physical Exam Details Patient Name: Date of Service: Kathryn [name] 12/03/2019 10:30 AM Medical Record [id number] Patient Account Number: [id number] Date of Birth/Sex: Treating RN: [birth year] (86 y.o. Kathryn [name] Primary Care  Provider: Eliezer [name] Other Clinician: Referring Provider: Treating  Provider/Extender:Kathryn [name], Kathryn [name] in Treatment: 13 Constitutional Sitting or standing Blood Pressure is within target range for patient.. Pulse regular and within target range for patient.Marland Kitchen Respirations regular, non-labored and within target range.. Temperature is normal and within the target range for the patient.Marland Kitchen Appears in no distress. Respiratory work of breathing is normal. Cardiovascular Needle pulses are palpable. We have good edema control.. Integumentary (Hair, Skin) Changes of chronic venous insufficiency bilaterally in the lower extremities. Notes Wound exam; her edema control is good. She has epithelialized over hyper granulation with some scar tissue. The area is still fragile. Electronic Signature(s) Signed: 12/03/2019 5:29:15 PM By: Kathryn [name] Entered By: Kathryn [name] on 12/03/2019 12:43:28 -------------------------------------------------------------------------------- Physician Orders Details Patient Name: Date of Service: Kathryn [name] 12/03/2019 10:30 AM Medical Record [id number] Patient Account Number: [id number] Date of Birth/Sex: Treating RN: 10/13/1930 (86 y.o. Kathryn [name] Primary Care Provider: Eliezer [name] Other Clinician: Referring Provider: Treating Provider/Extender:Arienna Benegas, Kathryn [name] in Treatment: 76 Verbal / Phone Orders: No Diagnosis Coding ICD-10 Coding Code Description I87.303 Chronic venous hypertension (idiopathic) without complications of bilateral lower extremity S81.812D Laceration without foreign body, left lower leg, subsequent encounter L97.222 Non-pressure chronic ulcer of left calf with fat layer exposed L03.116 Cellulitis of left lower limb Follow-up Appointments Return Appointment in 1 week. Dressing Change Frequency Wound #1 Left,Medial Lower Leg Do not change entire dressing for one week. Skin Barriers/Peri-Wound Care Wound #1 Left,Medial Lower Leg Moisturizing lotion  - to leg TCA Cream or Ointment - mixed with lotion Wound Cleansing Wound #1 Left,Medial Lower Leg May shower with protection. Primary Wound Dressing Wound #1 Left,Medial Lower Leg Calcium Alginate Secondary Dressing Wound #1 Left,Medial Lower Leg ABD pad Edema Control 3 Layer Compression System - Left Lower Extremity Avoid standing for long periods of time Elevate legs to the level of the heart or above for 30 minutes daily and/or when sitting, a frequency of: - throughout the day Exercise regularly Electronic Signature(s) Signed: 12/03/2019 5:29:15 PM By: Kathryn [name] Signed: 12/03/2019 5:59:01 PM By: Levan Hurst RN, BSN Entered By: Levan Hurst on 12/03/2019 11:46:19 -------------------------------------------------------------------------------- Problem List Details Patient Name: Date of Service: Kathryn [name] 12/03/2019 10:30 AM Medical Record [id number] Patient Account Number: [id number] Date of Birth/Sex: Treating RN: 11/27/1930 (86 y.o. Kathryn [name] Primary Care Provider: Eliezer [name] Other Clinician: Referring Provider: Treating Provider/Extender:Adonys Wildes, Kathryn [name] in Treatment: 13 Active Problems ICD-10 Evaluated Encounter Code Description Active Date Today Diagnosis I87.303 Chronic venous hypertension (idiopathic) without 99991111 No Yes complications of bilateral lower extremity S81.812D Laceration without foreign body, left lower leg, 08/28/2019 No Yes subsequent encounter L97.222 Non-pressure chronic ulcer of left calf with fat layer 08/28/2019 No Yes exposed L03.116 Cellulitis of left lower limb 09/14/2019 No Yes Inactive Problems Resolved Problems Electronic Signature(s) Signed: 12/03/2019 5:29:15 PM By: Kathryn [name] Entered By: Kathryn [name] on 12/03/2019 12:40:54 -------------------------------------------------------------------------------- Progress Note Details Patient Name: Date of Service: Kathryn [name] 12/03/2019 10:30 AM Medical Record [id number] Patient Account Number: [id number] Date of Birth/Sex: Treating RN: 1931/06/25 (86 y.o. Kathryn [name] Primary Care Provider: Eliezer [name] Other Clinician: Referring Provider: Treating Provider/Extender:Monika Chestang, Kathryn [name] in Treatment: 13 Subjective History of Present Illness (HPI) ADMISSION 08/28/19 This is an 86 year old woman who is still very active still working full-time at [employer] Sometime in late September her left calf was hit by the harness of her dog who was chasing another person.  She was left with a fairly clear skin tear she tried to pull the skin back over the wound but it it did not maintain. She was seen in her primary doctor's office on 07/20/2019 she was given a topical antibiotic and cephalexin. She was reviewed in primary care on 11/3 and it was felt the wound might need debridement and she was sent here. The patient has chronic venous insufficiency however she does not have a history of recurrent chronic wounds. Past medical history includes cellulitis of the left foot, hyperlipidemia, irregular heart rate, colonic polyps., Low back pain, B12 deficiency, Dupuytren's contractures, recurrent UTIs she has a history of bilateral hip surgery for osteoarthritis ABI in our clinic was 0.94 on the left 12/8; deep laceration wound on the left medial mid tibia. We are using Iodoflex under compression. She is not eligible for home health 12/15; deep laceration wound on the left mid tibia. We are using Iodoflex under compression. Perhaps some minimal improvement in wound surface certainly no improvement in surface area 12/18; the patient called in to report drainage coming out of the wound and increasing pain. She was brought in for a nurse visit. Noted to have serosanguineous drainage and I was asked to look at this. 12/21; 3-day follow-up. Culture I did on 12/18 grew multiple organisms but none  predominated. In spite of this the doxycycline really seems to have helped there is much less surrounding erythema. We allowed her to change the dressing over the weekend. The patient still works at [employer] There is edema around the wound because we did not wrap her last time. I wanted her to be able to continue to monitor the degree of erythema 12/28; the patient is not working and telling me she is staying off her foot is much as possible. She was approved for Apligraf but there was still too much debris over the wound surface today to consider this. Extensive debridement change dressing to Iodoflex 10/01/2019. The patient's wound is in a much better surface this week. We will order the Apligraf but continue Iodoflex for this week 1/11; healthy looking wound surface. Apligraf #1. No evidence of infection 1/25; wound down 1 cm in width and length. Apligraf #2 2/8; the wound is down in length and width. Apligraf #3 2/22; no real change in surface area however the wound certainly looks healthy. Apligraf #4 I will be looking an improvement in surface area next time will switch to an alternative dressing 3/8; the patient's wound is entirely epithelialized. Somewhat raised area either we epithelialized over hyper granulation or this is scar tissue. Nevertheless the area is fragile. Objective Constitutional Sitting or standing Blood Pressure is within target range for patient.. Pulse regular and within target range for patient.Marland Kitchen Respirations regular, non-labored and within target range.. Temperature is normal and within the target range for the patient.Marland Kitchen Appears in no distress. Vitals Time Taken: 11:20 AM, Height: 67 in, Weight: 125 lbs, BMI: 19.6, Temperature: 98.0 F, Pulse: 76 bpm, Respiratory Rate: 18 breaths/min, Blood Pressure: 143/79 mmHg. Respiratory work of breathing is normal. Cardiovascular Needle pulses are palpable. We have good edema control.. General Notes: Wound exam; her  edema control is good. She has epithelialized over hyper granulation with some scar tissue. The area is still fragile. Integumentary (Hair, Skin) Changes of chronic venous insufficiency bilaterally in the lower extremities. Wound #1 status is Open. Original cause of wound was Trauma. The wound is located on the Left,Medial Lower Leg. The wound measures 0cm length x 0cm width  x 0.1cm depth; 0.008cm^2 area and 0.001cm^3 volume. There is no tunneling or undermining noted. There is a none present amount of drainage noted. The wound margin is distinct with the outline attached to the wound base. There is no granulation within the wound bed. There is no necrotic tissue within the wound bed. Wound #2 status is Healed - Epithelialized. Original cause of wound was Trauma. The wound is located on the Left,Anterior Lower Leg. The wound measures 0cm length x 0cm width x 0cm depth; 0cm^2 area and 0cm^3 volume. Assessment Active Problems ICD-10 Chronic venous hypertension (idiopathic) without complications of bilateral lower extremity Laceration without foreign body, left lower leg, subsequent encounter Non-pressure chronic ulcer of left calf with fat layer exposed Cellulitis of left lower limb Procedures Wound #1 Pre-procedure diagnosis of Wound #1 is a Venous Leg Ulcer located on the Left,Medial Lower Leg . There was a Three Layer Compression Therapy Procedure by Levan Hurst, RN. Post procedure Diagnosis Wound #1: Same as Pre-Procedure Plan Follow-up Appointments: Return Appointment in 1 week. Dressing Change Frequency: Wound #1 Left,Medial Lower Leg: Do not change entire dressing for one week. Skin Barriers/Peri-Wound Care: Wound #1 Left,Medial Lower Leg: Moisturizing lotion - to leg TCA Cream or Ointment - mixed with lotion Wound Cleansing: Wound #1 Left,Medial Lower Leg: May shower with protection. Primary Wound Dressing: Wound #1 Left,Medial Lower Leg: Calcium Alginate Secondary  Dressing: Wound #1 Left,Medial Lower Leg: ABD pad Edema Control: 3 Layer Compression System - Left Lower Extremity Avoid standing for long periods of time Elevate legs to the level of the heart or above for 30 minutes daily and/or when sitting, a frequency of: - throughout the day Exercise regularly 1. We covered this with calcium alginate and put her back in 3 layer compression 2. The epithelialization requires some time to mature 3. We may be able to discharge her next week Electronic Signature(s) Signed: 12/03/2019 5:59:01 PM By: Levan Hurst RN, BSN Signed: 12/04/2019 5:48:31 PM By: Kathryn [name] Previous Signature: 12/03/2019 5:29:15 PM Version By: Kathryn [name] Entered By: Levan Hurst on 12/03/2019 17:32:03 -------------------------------------------------------------------------------- SuperBill Details Patient Name: Date of Service: Kathryn [name] 12/03/2019 Medical Record [id number] Patient Account Number: [id number] Date of Birth/Sex: Treating RN: 1930-11-27 (86 y.o. Kathryn [name] Primary Care Provider: Eliezer [name] Other Clinician: Referring Provider: Treating Provider/Extender:Raiyah Speakman, Kathryn [name] in Treatment: 13 Diagnosis Coding ICD-10 Codes Code Description I87.303 Chronic venous hypertension (idiopathic) without complications of bilateral lower extremity S81.812D Laceration without foreign body, left lower leg, subsequent encounter L97.222 Non-pressure chronic ulcer of left calf with fat layer exposed L03.116 Cellulitis of left lower limb Facility Procedures CPT4 Code Description: [id number] (Facility Use Only) [id number] - [place] LWR LT LEG Modifier: Quantity: 1 Physician Procedures Electronic Signature(s) Signed: 12/03/2019 5:59:01 PM By: Levan Hurst RN, BSN Signed: 12/04/2019 5:48:31 PM By: Kathryn [name] Previous Signature: 12/03/2019 5:29:15 PM Version By: Kathryn [name] Entered By: Levan Hurst on  12/03/2019 17:32:13

## 2019-12-10 ENCOUNTER — Encounter (HOSPITAL_BASED_OUTPATIENT_CLINIC_OR_DEPARTMENT_OTHER): Payer: Medicare Other | Admitting: Internal Medicine

## 2019-12-10 ENCOUNTER — Other Ambulatory Visit: Payer: Self-pay

## 2019-12-10 DIAGNOSIS — I87303 Chronic venous hypertension (idiopathic) without complications of bilateral lower extremity: Secondary | ICD-10-CM | POA: Diagnosis not present

## 2019-12-10 DIAGNOSIS — L03116 Cellulitis of left lower limb: Secondary | ICD-10-CM | POA: Diagnosis not present

## 2019-12-10 DIAGNOSIS — I87312 Chronic venous hypertension (idiopathic) with ulcer of left lower extremity: Secondary | ICD-10-CM | POA: Diagnosis not present

## 2019-12-10 DIAGNOSIS — L97822 Non-pressure chronic ulcer of other part of left lower leg with fat layer exposed: Secondary | ICD-10-CM | POA: Diagnosis not present

## 2019-12-10 DIAGNOSIS — I872 Venous insufficiency (chronic) (peripheral): Secondary | ICD-10-CM | POA: Diagnosis not present

## 2019-12-10 DIAGNOSIS — S81812A Laceration without foreign body, left lower leg, initial encounter: Secondary | ICD-10-CM | POA: Diagnosis not present

## 2019-12-10 DIAGNOSIS — L97222 Non-pressure chronic ulcer of left calf with fat layer exposed: Secondary | ICD-10-CM | POA: Diagnosis not present

## 2019-12-11 NOTE — Progress Notes (Addendum)
Kathryn [name], Kathryn [name] ([id number]) Visit Report for 12/10/2019 Arrival Information Details Patient Name: Date of Service: Kathryn [name], Kathryn [name] 12/10/2019 11:00 AM Medical Record [id number] Patient Account Number: [id number] Date of Birth/Sex: Treating RN: 1931/05/05 (86 y.o. [name] Primary Care Deborha Moseley: Eliezer Lofts Other Clinician: Referring Jessi Pitstick: Treating Parker Sawatzky/Extender:Robson, [name], [name] in Treatment: 14 Visit Information History Since Last Visit Added or deleted any medications: No Patient Arrived: Ambulatory Any new allergies or adverse reactions: No Arrival Time: 11:16 Had a fall or experienced change in No Accompanied By: self activities of daily living that may affect Transfer Assistance: None risk of falls: Patient Identification Verified: Yes Signs or symptoms of abuse/neglect since last No Secondary Verification Process Completed: Yes visito Patient Requires Transmission-Based No Hospitalized since last visit: No Precautions: Implantable device outside of the clinic excluding No Patient Has Alerts: No cellular tissue based products placed in the center since last visit: Has Dressing in Place as Prescribed: Yes Has Compression in Place as Prescribed: Yes Pain Present Now: No Electronic Signature(s) Signed: 12/10/2019 5:25:45 PM By: Kela Millin Entered By: Kela Millin on 12/10/2019 11:17:04 -------------------------------------------------------------------------------- Compression Therapy Details Patient Name: Date of Service: Kathryn [name] 12/10/2019 11:00 AM Medical Record [id number] Patient Account Number: [id number] Date of Birth/Sex: Treating RN: 1931/01/23 (86 y.o. Kathryn [name] Primary Care Kresha Abelson: Eliezer Lofts Other Clinician: Referring Grayson White: Treating Alena Blankenbeckler/Extender:Robson, [name], [name] in Treatment: 14 Compression Therapy Performed for Wound Wound #1 Left,Medial Lower  Leg Assessment: Performed By: Clinician Levan Hurst, RN Compression Type: Three Layer Post Procedure Diagnosis Same as Pre-procedure Electronic Signature(s) Signed: 12/10/2019 5:44:18 PM By: Levan Hurst RN, BSN Entered By: Levan Hurst on 12/10/2019 11:41:44 -------------------------------------------------------------------------------- Compression Therapy Details Patient Name: Date of Service: Kathryn [name] 12/10/2019 11:00 AM Medical Record [id number] Patient Account Number: [id number] Date of Birth/Sex: Treating RN: [birth year] (86 y.o. Kathryn [name] Primary Care Nhyira Leano: Eliezer Lofts Other Clinician: Referring Cherri Yera: Treating Carrie Usery/Extender:Robson, [name], [name] in Treatment: 14 Compression Therapy Performed for Wound Wound #3 Left,Distal,Medial Lower Leg Assessment: Performed By: Clinician Levan Hurst, RN Compression Type: Three Layer Post Procedure Diagnosis Same as Pre-procedure Electronic Signature(s) Signed: 12/10/2019 5:44:18 PM By: Levan Hurst RN, BSN Entered By: Levan Hurst on 12/10/2019 11:41:44 -------------------------------------------------------------------------------- Lower Extremity Assessment Details Patient Name: Date of Service: Kathryn [name] 12/10/2019 11:00 AM Medical Record [id number] Patient Account Number: [id number] Date of Birth/Sex: Treating RN: 02/03/1931 (86 y.o. [name] Primary Care Elton Heid: Eliezer Lofts Other Clinician: Referring Halton Neas: Treating Aladdin Kollmann/Extender:Robson, [name], [name] in Treatment: 14 Edema Assessment Assessed: [Left: No] [Right: No] Edema: [Left: N] [Right: o] Calf Left: Right: Point of Measurement: cm From Medial Instep 29 cm cm Ankle Left: Right: Point of Measurement: cm From Medial Instep 20.3 cm cm Vascular Assessment Pulses: Dorsalis Pedis Palpable: [Left:Yes] Electronic Signature(s) Signed: 12/10/2019 5:25:45 PM By:  Kela Millin Entered By: Kela Millin on 12/10/2019 11:23:35 -------------------------------------------------------------------------------- Multi Wound Chart Details Patient Name: Date of Service: Kathryn [name] 12/10/2019 11:00 AM Medical Record [id number] Patient Account Number: [id number] Date of Birth/Sex: Treating RN: 18-Oct-1930 (86 y.o. Kathryn [name] Primary Care Hilery Wintle: Eliezer Lofts Other Clinician: Referring Mayzee Reichenbach: Treating Neilson Oehlert/Extender:Robson, [name], [name] in Treatment: 14 Vital Signs Height(in): 67 Pulse(bpm): 74 Weight(lbs): 125 Blood Pressure(mmHg): 139/77 Body Mass Index(BMI): 20 Temperature(F): 98.3 Respiratory 18 Rate(breaths/min): Photos: [1:No Photos] [3:No Photos] [N/A:N/A] Wound Location: [1:Left Lower Leg - Medial] [3:Left Lower Leg - Medial, Distal] [N/A:N/A] Wounding Event: [1:Trauma] [3:Gradually Appeared] [N/A:N/A] Primary Etiology: [1:Venous  Leg Ulcer] [3:Trauma, Other] [N/A:N/A] Comorbid History: [1:Cataracts, Osteoarthritis, Confinement Anxiety] [3:Cataracts, Osteoarthritis, Confinement Anxiety] [N/A:N/A] Date Acquired: [1:05/29/2019] [3:12/10/2019] [N/A:N/A] Weeks of Treatment: [1:14] [3:0] [N/A:N/A] Wound Status: [1:Open] [3:Open] [N/A:N/A] Measurements L x W x D 0.2x0.2x0.1 [3:1.2x0.9x0.1] [N/A:N/A] (cm) Area (cm) : [1:0.031] [3:0.848] [N/A:N/A] Volume (cm) : [1:0.003] [3:0.085] [N/A:N/A] % Reduction in Area: [1:99.70%] [3:N/A] [N/A:N/A] % Reduction in Volume: 99.90% [3:N/A] [N/A:N/A] Classification: [1:Full Thickness Without Exposed Support Structures Exposed Support Structures] [3:Full Thickness Without] [N/A:N/A] Exudate Amount: [1:Small] [3:Small] [N/A:N/A] Exudate Type: [1:Serous] [3:Serous] [N/A:N/A] Exudate Color: [1:amber] [3:amber] [N/A:N/A] Wound Margin: [1:Distinct, outline attached] [3:Distinct, outline attached] [N/A:N/A] Granulation Amount: [1:Large (67-100%)] [3:Large (67-100%)]  [N/A:N/A] Granulation Quality: [1:Pink] [3:Red, Pink] [N/A:N/A] Necrotic Amount: [1:None Present (0%)] [3:None Present (0%)] [N/A:N/A] Exposed Structures: [1:Fat Layer (Subcutaneous Tissue) Exposed: Yes Fascia: No Tendon: No Muscle: No Joint: No Bone: No] [3:Fascia: No Fat Layer (Subcutaneous Tissue) Exposed: No Tendon: No Muscle: No Joint: No Bone: No] [N/A:N/A] Epithelialization: [1:Large (67-100%) Compression Therapy] [3:None Compression Therapy] [N/A:N/A N/A] Treatment Notes Electronic Signature(s) Signed: 12/10/2019 5:44:18 PM By: Levan Hurst RN, BSN Signed: 12/11/2019 10:32:20 AM By: Linton Ham MD Entered By: Linton Ham on 12/10/2019 12:11:54 -------------------------------------------------------------------------------- Multi-Disciplinary Care Plan Details Patient Name: Date of Service: Kathryn Laine B. 12/10/2019 11:00 AM Medical Record [id number] Patient Account Number: [id number] Date of Birth/Sex: Treating RN: [birth year] (86 y.o. Kathryn [name] Primary Care Rhealyn Cullen: Eliezer Lofts Other Clinician: Referring Harvey Matlack: Treating Thomas Rhude/Extender:Robson, [name], [name] in Treatment: 14 Active Inactive Wound/Skin Impairment Nursing Diagnoses: Knowledge deficit related to ulceration/compromised skin integrity Goals: Patient/caregiver will verbalize understanding of skin care regimen Date Initiated: 08/28/2019 Target Resolution Date: 12/21/2019 Goal Status: Active Ulcer/skin breakdown will have a volume reduction of 30% by week 4 Date Initiated: 08/28/2019 Date Inactivated: 10/01/2019 Target Resolution Date: 10/05/2019 Goal Status: Met Ulcer/skin breakdown will have a volume reduction of 50% by week 8 Date Initiated: 10/01/2019 Date Inactivated: 11/19/2019 Target Resolution Date: 11/02/2019 Goal Status: Met Interventions: Assess patient/caregiver ability to obtain necessary supplies Assess patient/caregiver ability to perform ulcer/skin care regimen  upon admission and as needed Assess ulceration(s) every visit Notes: Electronic Signature(s) Signed: 12/10/2019 5:44:18 PM By: Levan Hurst RN, BSN Entered By: Levan Hurst on 12/10/2019 11:34:25 -------------------------------------------------------------------------------- Pain Assessment Details Patient Name: Date of Service: Kathryn [name] 12/10/2019 11:00 AM Medical Record [id number] Patient Account Number: [id number] Date of Birth/Sex: Treating RN: May 20, 1931 (86 y.o. [name] Primary Care Shirely Toren: Eliezer Lofts Other Clinician: Referring Alonia Dibuono: Treating Kavion Mancinas/Extender:Robson, [name], [name] in Treatment: 14 Active Problems Location of Pain Severity and Description of Pain Patient Has Paino No Site Locations Pain Management and Medication Current Pain Management: Electronic Signature(s) Signed: 12/10/2019 5:25:45 PM By: Kela Millin Entered By: Kela Millin on 12/10/2019 11:23:05 -------------------------------------------------------------------------------- Patient/Caregiver Education Details Patient Name: Date of Service: [name], Kathryn B. 3/15/2021andnbsp11:00 AM Medical Record [id number] Patient Account Number: [id number] Date of Birth/Gender: [birth year] (86 y.o. F) Treating RN: Levan Hurst Primary Care Physician: Eliezer Lofts Other Clinician: Referring Physician: Treating Physician/Extender:Robson, [name], [name] in Treatment: 14 Education Assessment Education Provided To: Patient Education Topics Provided Wound/Skin Impairment: Methods: Explain/Verbal Responses: State content correctly Electronic Signature(s) Signed: 12/10/2019 5:44:18 PM By: Levan Hurst RN, BSN Entered By: Levan Hurst on 12/10/2019 11:34:46 -------------------------------------------------------------------------------- Wound Assessment Details Patient Name: Date of Service: Kathryn [name] 12/10/2019 11:00  AM Medical Record [id number] Patient Account Number: [id number] Date of Birth/Sex: Treating RN: 04/08/1931 (86 y.o. Kathryn [name] Primary Care Kimberlea Schlag: Eliezer Lofts Other Clinician: Referring Chase Knebel: Treating  Duayne Brideau/Extender:Robson, [name], [name] in Treatment: 14 Wound Status Wound Number: 1 Primary Venous Leg Ulcer Etiology: Wound Location: Left Lower Leg - Medial Wound Status: Open Wounding Event: Trauma Comorbid Cataracts, Osteoarthritis, Confinement Date Acquired: 05/29/2019 History: Anxiety Weeks Of Treatment: 14 Clustered Wound: No Photos Wound Measurements Length: (cm) 0.2 % Reduct Width: (cm) 0.2 % Reduct Depth: (cm) 0.1 Epitheli Area: (cm) 0.031 Tunneli Volume: (cm) 0.003 Undermi Wound Description Full Thickness Without Exposed Support Classification: Structures Wound Distinct, outline attached Margin: Exudate Small Amount: Exudate Serous Type: Exudate amber Color: Wound Bed Granulation Amount: Large (67-100%) Granulation Quality: Pink Necrotic Amount: None Present (0%) Foul Odor After Cleansing: No Slough/Fibrino No Exposed Structure Fascia Exposed: No Fat Layer (Subcutaneous Tissue) Exposed: Yes Tendon Exposed: No Muscle Exposed: No Joint Exposed: No Bone Exposed: No ion in Area: 99.7% ion in Volume: 99.9% alization: Large (67-100%) ng: No ning: No Electronic Signature(s) Signed: 12/11/2019 4:45:30 PM By: Mikeal Hawthorne EMT/HBOT Signed: 01/09/2020 9:03:51 AM By: Levan Hurst RN, BSN Previous Signature: 12/10/2019 5:25:45 PM Version By: Kela Millin Entered By: Mikeal Hawthorne on 12/11/2019 11:04:35 -------------------------------------------------------------------------------- Wound Assessment Details Patient Name: Date of Service: Kathryn [name] 12/10/2019 11:00 AM Medical Record [id number] Patient Account Number: [id number] Date of Birth/Sex: Treating RN: August 28, 1931 (86 y.o. Kathryn [name] Primary Care Kaija Kovacevic: Eliezer Lofts Other Clinician: Referring Jahsir Rama: Treating Zymarion Favorite/Extender:Robson, [name], [name] in Treatment: 14 Wound Status Wound Number: 3 Primary Trauma, Other Etiology: Wound Location: Left Lower Leg - Medial, Distal Wound Status: Open Wounding Event: Gradually Appeared Comorbid Cataracts, Osteoarthritis, Confinement Date Acquired: 12/10/2019 History: Anxiety Weeks Of Treatment: 0 Clustered Wound: No Photos Wound Measurements Length: (cm) 1.2 % Reduct Width: (cm) 0.9 % Reduct Depth: (cm) 0.1 Epitheli Area: (cm) 0.848 Tunneli Volume: (cm) 0.085 Undermi Wound Description Full Thickness Without Exposed Support Foul Od Classification: Structures Slough/ Wound Distinct, outline attached Margin: Exudate Small Amount: Exudate Serous Type: Exudate amber Color: Wound Bed Granulation Amount: Large (67-100%) Granulation Quality: Red, Pink Fascia Necrotic Amount: None Present (0%) Fat Lay Tendon Muscle Joint E Bone Ex or After Cleansing: No Fibrino No Exposed Structure Exposed: No er (Subcutaneous Tissue) Exposed: No Exposed: No Exposed: No xposed: No posed: No ion in Area: 0% ion in Volume: 0% alization: None ng: No ning: No Electronic Signature(s) Signed: 12/11/2019 4:45:30 PM By: Mikeal Hawthorne EMT/HBOT Signed: 01/09/2020 9:03:51 AM By: Levan Hurst RN, BSN Previous Signature: 12/10/2019 5:25:45 PM Version By: Kela Millin Entered By: Mikeal Hawthorne on 12/11/2019 11:05:03 -------------------------------------------------------------------------------- Vitals Details Patient Name: Date of Service: Kathryn Laine B. 12/10/2019 11:00 AM Medical Record [id number] Patient Account Number: [id number] Date of Birth/Sex: Treating RN: 02-28-1931 (86 y.o. [name] Primary Care Alexy Heldt: Eliezer Lofts Other Clinician: Referring Shila Kruczek: Treating Cortland Crehan/Extender:Robson, [name],  [name] in Treatment: 14 Vital Signs Time Taken: 11:15 Temperature (F): 98.3 Height (in): 67 Pulse (bpm): 74 Weight (lbs): 125 Respiratory Rate (breaths/min): 18 Body Mass Index (BMI): 19.6 Blood Pressure (mmHg): 139/77 Reference Range: 80 - 120 mg / dl Electronic Signature(s) Signed: 12/10/2019 5:25:45 PM By: Kela Millin Entered By: Kela Millin on 12/10/2019 11:18:22

## 2019-12-11 NOTE — Progress Notes (Signed)
Kathryn [name], Kathryn [name] ([id number]) Visit Report for 12/10/2019 HPI Details Patient Name: Date of Service: Kathryn, [name] 12/10/2019 11:00 AM Medical Record [id number] Patient Account Number: [id number] Date of Birth/Sex: Treating RN: [birth year] (86 y.o. [name] Primary Care Provider: Eliezer Lofts Other Clinician: Referring Provider: Treating Provider/Extender:Vanya Carberry, [name], [name] in Treatment: 14 History of Present Illness HPI Description: ADMISSION 08/28/19 This is an 86 year old woman who is still very active still working full-time at [employer] Sometime in late September her left calf was hit by the harness of her dog who was chasing another person. She was left with a fairly clear skin tear she tried to pull the skin back over the wound but it it did not maintain. She was seen in her primary doctor's office on 07/20/2019 she was given a topical antibiotic and cephalexin. She was reviewed in primary care on 11/3 and it was felt the wound might need debridement and she was sent here. The patient has chronic venous insufficiency however she does not have a history of recurrent chronic wounds. Past medical history includes cellulitis of the left foot, hyperlipidemia, irregular heart rate, colonic polyps., Low back pain, B12 deficiency, Dupuytren's contractures, recurrent UTIs she has a history of bilateral hip surgery for osteoarthritis ABI in our clinic was 0.94 on the left 12/8; deep laceration wound on the left medial mid tibia. We are using Iodoflex under compression. She is not eligible for home health 12/15; deep laceration wound on the left mid tibia. We are using Iodoflex under compression. Perhaps some minimal improvement in wound surface certainly no improvement in surface area 12/18; the patient called in to report drainage coming out of the wound and increasing pain. She was brought in for a nurse visit. Noted to have serosanguineous drainage and I was  asked to look at this. 12/21; 3-day follow-up. Culture I did on 12/18 grew multiple organisms but none predominated. In spite of this the doxycycline really seems to have helped there is much less surrounding erythema. We allowed her to change the dressing over the weekend. The patient still works at [employer] There is edema around the wound because we did not wrap her last time. I wanted her to be able to continue to monitor the degree of erythema 12/28; the patient is not working and telling me she is staying off her foot is much as possible. She was approved for Apligraf but there was still too much debris over the wound surface today to consider this. Extensive debridement change dressing to Iodoflex 10/01/2019. The patient's wound is in a much better surface this week. We will order the Apligraf but continue Iodoflex for this week 1/11; healthy looking wound surface. Apligraf #1. No evidence of infection 1/25; wound down 1 cm in width and length. Apligraf #2 2/8; the wound is down in length and width. Apligraf #3 2/22; no real change in surface area however the wound certainly looks healthy. Apligraf #4 I will be looking an improvement in surface area next time will switch to an alternative dressing 3/8; the patient's wound is entirely epithelialized. Somewhat raised area either we epithelialized over hyper granulation or this is scar tissue. Nevertheless the area is fragile. 3/15; the patient arrived with a small wound just underneath the original wound which almost look like a wrap injury or perhaps skin tear while the wrap was being removed. She also has a frail area in the oval-shaped original wound at roughly 4:00. This may be epithelialized is difficult to be  certain. Very small Electronic Signature(s) Signed: 12/11/2019 10:32:20 AM By: Linton Ham MD Entered By: Linton Ham on 12/10/2019  12:12:59 -------------------------------------------------------------------------------- Physical Exam Details Patient Name: Date of Service: Kathryn [name] 12/10/2019 11:00 AM Medical Record [id number] Patient Account Number: [id number] Date of Birth/Sex: Treating RN: 15-Nov-1930 (86 y.o. [name] Primary Care Provider: Eliezer Lofts Other Clinician: Referring Provider: Treating Provider/Extender:Satsuki Zillmer, [name], [name] in Treatment: 14 Constitutional Patient is hypertensive.. Pulse regular and within target range for patient.Marland Kitchen Respirations regular, non-labored and within target range.. Temperature is normal and within the target range for the patient.Marland Kitchen Appears in no distress. Cardiovascular Pedal pulses palpable. Notes Wound exam; edema control is good. She is epithelialized however there is a small vulnerable area at roughly 4:00 in this long oval-shaped wound. This looks somewhat fragile. Beneath the wound may be 2.5 cm away posteriorly is what looks to be a fresh skin tear Electronic Signature(s) Signed: 12/11/2019 10:32:20 AM By: Linton Ham MD Entered By: Linton Ham on 12/10/2019 12:16:26 -------------------------------------------------------------------------------- Physician Orders Details Patient Name: Date of Service: Kathryn [name]. 12/10/2019 11:00 AM Medical Record [id number] Patient Account Number: [id number] Date of Birth/Sex: Treating RN: 06/28/1931 (86 y.o. [name] Primary Care Provider: Eliezer Lofts Other Clinician: Referring Provider: Treating Provider/Extender:Lew Prout, [name], [name] in Treatment: 52 Verbal / Phone Orders: No Diagnosis Coding ICD-10 Coding Code Description I87.303 Chronic venous hypertension (idiopathic) without complications of bilateral lower extremity S81.812D Laceration without foreign body, left lower leg, subsequent encounter L97.222 Non-pressure chronic ulcer of left  calf with fat layer exposed L03.116 Cellulitis of left lower limb Follow-up Appointments Return Appointment in 1 week. Dressing Change Frequency Do not change entire dressing for one week. Skin Barriers/Peri-Wound Care Moisturizing lotion - to leg TCA Cream or Ointment - mixed with lotion Wound Cleansing May shower with protection. Primary Wound Dressing Wound #1 Left,Medial Lower Leg Calcium Alginate Wound #3 Left,Distal,Medial Lower Leg Calcium Alginate Secondary Dressing Wound #1 Left,Medial Lower Leg ABD pad Wound #3 Left,Distal,Medial Lower Leg ABD pad Edema Control 3 Layer Compression System - Left Lower Extremity Avoid standing for long periods of time Elevate legs to the level of the heart or above for 30 minutes daily and/or when sitting, a frequency of: - throughout the day Exercise regularly Electronic Signature(s) Signed: 12/10/2019 5:44:18 PM By: Levan Hurst RN, BSN Signed: 12/11/2019 10:32:20 AM By: Linton Ham MD Entered By: Levan Hurst on 12/10/2019 11:41:02 -------------------------------------------------------------------------------- Problem List Details Patient Name: Date of Service: Kathryn [name] 12/10/2019 11:00 AM Medical Record [id number] Patient Account Number: [id number] Date of Birth/Sex: Treating RN: 1930-11-11 (86 y.o. [name] Primary Care Provider: Eliezer Lofts Other Clinician: Referring Provider: Treating Provider/Extender:[name], [name], [name] in Treatment: 14 Active Problems ICD-10 Evaluated Encounter Code Description Active Date Today Diagnosis I87.303 Chronic venous hypertension (idiopathic) without 99991111 No Yes complications of bilateral lower extremity S81.812D Laceration without foreign body, left lower leg, 08/28/2019 No Yes subsequent encounter L97.222 Non-pressure chronic ulcer of left calf with fat layer 08/28/2019 No Yes exposed L03.116 Cellulitis of left lower limb 09/14/2019 No  Yes Inactive Problems Resolved Problems Electronic Signature(s) Signed: 12/11/2019 10:32:20 AM By: Linton Ham MD Entered By: Linton Ham on 12/10/2019 12:09:08 -------------------------------------------------------------------------------- Progress Note Details Patient Name: Date of Service: Kathryn [name] 12/10/2019 11:00 AM Medical Record [id number] Patient Account Number: [id number] Date of Birth/Sex: Treating RN: 1931/01/28 (86 y.o. [name] Primary Care Provider: Eliezer Lofts Other Clinician: Referring Provider: Treating Provider/Extender:Kamrie Fanton, [name], [name] in  Treatment: 14 Subjective History of Present Illness (HPI) ADMISSION 08/28/19 This is an 86 year old woman who is still very active still working full-time at [employer] Sometime in late September her left calf was hit by the harness of her dog who was chasing another person. She was left with a fairly clear skin tear she tried to pull the skin back over the wound but it it did not maintain. She was seen in her primary doctor's office on 07/20/2019 she was given a topical antibiotic and cephalexin. She was reviewed in primary care on 11/3 and it was felt the wound might need debridement and she was sent here. The patient has chronic venous insufficiency however she does not have a history of recurrent chronic wounds. Past medical history includes cellulitis of the left foot, hyperlipidemia, irregular heart rate, colonic polyps., Low back pain, B12 deficiency, Dupuytren's contractures, recurrent UTIs she has a history of bilateral hip surgery for osteoarthritis ABI in our clinic was 0.94 on the left 12/8; deep laceration wound on the left medial mid tibia. We are using Iodoflex under compression. She is not eligible for home health 12/15; deep laceration wound on the left mid tibia. We are using Iodoflex under compression. Perhaps some minimal improvement in wound surface certainly no  improvement in surface area 12/18; the patient called in to report drainage coming out of the wound and increasing pain. She was brought in for a nurse visit. Noted to have serosanguineous drainage and I was asked to look at this. 12/21; 3-day follow-up. Culture I did on 12/18 grew multiple organisms but none predominated. In spite of this the doxycycline really seems to have helped there is much less surrounding erythema. We allowed her to change the dressing over the weekend. The patient still works at [employer] There is edema around the wound because we did not wrap her last time. I wanted her to be able to continue to monitor the degree of erythema 12/28; the patient is not working and telling me she is staying off her foot is much as possible. She was approved for Apligraf but there was still too much debris over the wound surface today to consider this. Extensive debridement change dressing to Iodoflex 10/01/2019. The patient's wound is in a much better surface this week. We will order the Apligraf but continue Iodoflex for this week 1/11; healthy looking wound surface. Apligraf #1. No evidence of infection 1/25; wound down 1 cm in width and length. Apligraf #2 2/8; the wound is down in length and width. Apligraf #3 2/22; no real change in surface area however the wound certainly looks healthy. Apligraf #4 I will be looking an improvement in surface area next time will switch to an alternative dressing 3/8; the patient's wound is entirely epithelialized. Somewhat raised area either we epithelialized over hyper granulation or this is scar tissue. Nevertheless the area is fragile. 3/15; the patient arrived with a small wound just underneath the original wound which almost look like a wrap injury or perhaps skin tear while the wrap was being removed. She also has a frail area in the oval-shaped original wound at roughly 4:00. This may be epithelialized is difficult to be certain. Very  small Objective Constitutional Patient is hypertensive.. Pulse regular and within target range for patient.Marland Kitchen Respirations regular, non-labored and within target range.. Temperature is normal and within the target range for the patient.Marland Kitchen Appears in no distress. Vitals Time Taken: 11:15 AM, Height: 67 in, Weight: 125 lbs, BMI: 19.6, Temperature: 98.3 F,  Pulse: 74 bpm, Respiratory Rate: 18 breaths/min, Blood Pressure: 139/77 mmHg. Cardiovascular Pedal pulses palpable. General Notes: Wound exam; edema control is good. She is epithelialized however there is a small vulnerable area at roughly 4:00 in this long oval-shaped wound. This looks somewhat fragile. Beneath the wound may be 2.5 cm away posteriorly is what looks to be a fresh skin tear Integumentary (Hair, Skin) Wound #1 status is Open. Original cause of wound was Trauma. The wound is located on the Left,Medial Lower Leg. The wound measures 0.2cm length x 0.2cm width x 0.1cm depth; 0.031cm^2 area and 0.003cm^3 volume. There is Fat Layer (Subcutaneous Tissue) Exposed exposed. There is no tunneling or undermining noted. There is a small amount of serous drainage noted. The wound margin is distinct with the outline attached to the wound base. There is large (67-100%) pink granulation within the wound bed. There is no necrotic tissue within the wound bed. Wound #3 status is Open. Original cause of wound was Gradually Appeared. The wound is located on the Left,Distal,Medial Lower Leg. The wound measures 1.2cm length x 0.9cm width x 0.1cm depth; 0.848cm^2 area and 0.085cm^3 volume. There is no tunneling or undermining noted. There is a small amount of serous drainage noted. The wound margin is distinct with the outline attached to the wound base. There is large (67-100%) red, pink granulation within the wound bed. There is no necrotic tissue within the wound bed. Assessment Active Problems ICD-10 Chronic venous hypertension (idiopathic)  without complications of bilateral lower extremity Laceration without foreign body, left lower leg, subsequent encounter Non-pressure chronic ulcer of left calf with fat layer exposed Cellulitis of left lower limb Procedures Wound #1 Pre-procedure diagnosis of Wound #1 is a Venous Leg Ulcer located on the Left,Medial Lower Leg . There was a Three Layer Compression Therapy Procedure by Levan Hurst, RN. Post procedure Diagnosis Wound #1: Same as Pre-Procedure Wound #3 Pre-procedure diagnosis of Wound #3 is a Trauma, Other located on the Left,Distal,Medial Lower Leg . There was a Three Layer Compression Therapy Procedure by Levan Hurst, RN. Post procedure Diagnosis Wound #3: Same as Pre-Procedure Plan Follow-up Appointments: Return Appointment in 1 week. Dressing Change Frequency: Do not change entire dressing for one week. Skin Barriers/Peri-Wound Care: Moisturizing lotion - to leg TCA Cream or Ointment - mixed with lotion Wound Cleansing: May shower with protection. Primary Wound Dressing: Wound #1 Left,Medial Lower Leg: Calcium Alginate Wound #3 Left,Distal,Medial Lower Leg: Calcium Alginate Secondary Dressing: Wound #1 Left,Medial Lower Leg: ABD pad Wound #3 Left,Distal,Medial Lower Leg: ABD pad Edema Control: 3 Layer Compression System - Left Lower Extremity Avoid standing for long periods of time Elevate legs to the level of the heart or above for 30 minutes daily and/or when sitting, a frequency of: - throughout the day Exercise regularly #1 continue with silver alginate to both wound areas ABDs and back in 3 layer compression. 2. I think the new wound is probably a wrap injury or perhaps a skin tear taking off her wrap. This is superficial even under illumination does not look ominous 3. No evidence of infection Electronic Signature(s) Signed: 12/11/2019 10:32:20 AM By: Linton Ham MD Entered By: Linton Ham on 12/10/2019  12:17:49 -------------------------------------------------------------------------------- SuperBill Details Patient Name: Date of Service: Kathryn [name] 12/10/2019 Medical Record [id number] Patient Account Number: [id number] Date of Birth/Sex: Treating RN: 04/05/1931 (86 y.o. [name] Primary Care Provider: Eliezer Lofts Other Clinician: Referring Provider: Treating Provider/Extender:Raylea Adcox, [name], [name] in Treatment: 14 Diagnosis Coding ICD-10 Codes Code  Description I87.303 Chronic venous hypertension (idiopathic) without complications of bilateral lower extremity S81.812D Laceration without foreign body, left lower leg, subsequent encounter L97.222 Non-pressure chronic ulcer of left calf with fat layer exposed L03.116 Cellulitis of left lower limb Facility Procedures CPT4 Code Description: [id number] (Facility Use Only) [phone] - APPLY MULTLAY COMPRS LWR RT LEG Modifier: Quantity: 1 Physician Procedures CPT4 Code Description: [id number] [place] - WC PHYS LEVEL 3 - EST PT ICD-10 Diagnosis Description L97.222 Non-pressure chronic ulcer of left calf with fat layer exposed I87.303 Chronic venous hypertension (idiopathic) without complications extremity Modifier: of bilateral low Quantity: 1 er Engineer, maintenance) Signed: 12/10/2019 5:44:18 PM By: Levan Hurst RN, BSN Signed: 12/11/2019 10:32:20 AM By: Linton Ham MD Entered By: Levan Hurst on 12/10/2019 13:55:28

## 2019-12-17 ENCOUNTER — Other Ambulatory Visit: Payer: Self-pay

## 2019-12-17 ENCOUNTER — Encounter (HOSPITAL_BASED_OUTPATIENT_CLINIC_OR_DEPARTMENT_OTHER): Payer: Medicare Other | Admitting: Internal Medicine

## 2019-12-17 DIAGNOSIS — I87303 Chronic venous hypertension (idiopathic) without complications of bilateral lower extremity: Secondary | ICD-10-CM | POA: Diagnosis not present

## 2019-12-17 DIAGNOSIS — L97222 Non-pressure chronic ulcer of left calf with fat layer exposed: Secondary | ICD-10-CM | POA: Diagnosis not present

## 2019-12-17 DIAGNOSIS — I872 Venous insufficiency (chronic) (peripheral): Secondary | ICD-10-CM | POA: Diagnosis not present

## 2019-12-17 DIAGNOSIS — L03116 Cellulitis of left lower limb: Secondary | ICD-10-CM | POA: Diagnosis not present

## 2019-12-17 DIAGNOSIS — S81812A Laceration without foreign body, left lower leg, initial encounter: Secondary | ICD-10-CM | POA: Diagnosis not present

## 2019-12-17 NOTE — Progress Notes (Signed)
Kathryn, [name] ([id number]) Visit Report for 12/17/2019 HPI Details Patient Name: Date of Service: Kathryn [name], Kathryn [name] 12/17/2019 1:15 PM Medical Record [id number] Patient Account Number: [id number] Date of Birth/Sex: Treating RN: [birth year] (86 y.o. Kathryn [name] Primary Care Provider: Eliezer Lofts Other Clinician: Referring Provider: Treating Provider/Extender:Jhoel Stieg, [name], [name] in Treatment: 15 History of Present Illness HPI Description: ADMISSION 08/28/19 This is an 86 year old woman who is still very active still working full-time at [employer] Sometime in late September her left calf was hit by the harness of her dog who was chasing another person. She was left with a fairly clear skin tear she tried to pull the skin back over the wound but it it did not maintain. She was seen in her primary doctor's office on 07/20/2019 she was given a topical antibiotic and cephalexin. She was reviewed in primary care on 11/3 and it was felt the wound might need debridement and she was sent here. The patient has chronic venous insufficiency however she does not have a history of recurrent chronic wounds. Past medical history includes cellulitis of the left foot, hyperlipidemia, irregular heart rate, colonic polyps., Low back pain, B12 deficiency, Dupuytren's contractures, recurrent UTIs she has a history of bilateral hip surgery for osteoarthritis ABI in our clinic was 0.94 on the left 12/8; deep laceration wound on the left medial mid tibia. We are using Iodoflex under compression. She is not eligible for home health 12/15; deep laceration wound on the left mid tibia. We are using Iodoflex under compression. Perhaps some minimal improvement in wound surface certainly no improvement in surface area 12/18; the patient called in to report drainage coming out of the wound and increasing pain. She was brought in for a nurse visit. Noted to have serosanguineous drainage and I was  asked to look at this. 12/21; 3-day follow-up. Culture I did on 12/18 grew multiple organisms but none predominated. In spite of this the doxycycline really seems to have helped there is much less surrounding erythema. We allowed her to change the dressing over the weekend. The patient still works at [employer] There is edema around the wound because we did not wrap her last time. I wanted her to be able to continue to monitor the degree of erythema 12/28; the patient is not working and telling me she is staying off her foot is much as possible. She was approved for Apligraf but there was still too much debris over the wound surface today to consider this. Extensive debridement change dressing to Iodoflex 10/01/2019. The patient's wound is in a much better surface this week. We will order the Apligraf but continue Iodoflex for this week 1/11; healthy looking wound surface. Apligraf #1. No evidence of infection 1/25; wound down 1 cm in width and length. Apligraf #2 2/8; the wound is down in length and width. Apligraf #3 2/22; no real change in surface area however the wound certainly looks healthy. Apligraf #4 I will be looking an improvement in surface area next time will switch to an alternative dressing 3/8; the patient's wound is entirely epithelialized. Somewhat raised area either we epithelialized over hyper granulation or this is scar tissue. Nevertheless the area is fragile. 3/15; the patient arrived with a small wound just underneath the original wound which almost look like a wrap injury or perhaps skin tear while the wrap was being removed. She also has a frail area in the oval-shaped original wound at roughly 4:00. This may be epithelialized is difficult to be  certain. Very small 3/22; the patient arrives with both wounds identified last week including the lower part of her original wound in theo Wrap injury healed however she has a new wound superiorly on the same side of the same  leg. She tells me she went out to walk a mile but did not notice anything else different. Electronic Signature(s) Signed: 12/17/2019 5:34:39 PM By: Linton Ham MD Entered By: Linton Ham on 12/17/2019 15:06:32 -------------------------------------------------------------------------------- Physical Exam Details Patient Name: Date of Service: Kathryn, [name] 12/17/2019 1:15 PM Medical Record [id number] Patient Account Number: [id number] Date of Birth/Sex: Treating RN: June 21, 1931 (86 y.o. Kathryn [name] Primary Care Provider: Eliezer Lofts Other Clinician: Referring Provider: Treating Provider/Extender:Avery Eustice, [name], [name] in Treatment: 15 Constitutional Sitting or standing Blood Pressure is within target range for patient.. Pulse regular and within target range for patient.Marland Kitchen Respirations regular, non-labored and within target range.. Temperature is normal and within the target range for the patient.Marland Kitchen Appears in no distress. Cardiovascular Fetal pulses are palpable. Edema control is good chronic venous changes. Notes Wound exam; her edema control is good. She has changes of chronic venous insufficiency. Her original wound is totally healed. The satellite area below the original wound from last week is also healed. HOWEVER she has a new wound above her original wound. This does look like a wrap injury. Electronic Signature(s) Signed: 12/17/2019 5:34:39 PM By: Linton Ham MD Entered By: Linton Ham on 12/17/2019 15:07:38 -------------------------------------------------------------------------------- Physician Orders Details Patient Name: Date of Service: KEVIONNA, POSTHUMUS 12/17/2019 1:15 PM Medical Record [id number] Patient Account Number: [id number] Date of Birth/Sex: Treating RN: October 15, 1930 (86 y.o. Kathryn [name] Primary Care Provider: Eliezer Lofts Other Clinician: Referring Provider: Treating Provider/Extender:Berline Semrad, [name],  [name] in Treatment: 15 Verbal / Phone Orders: No Diagnosis Coding ICD-10 Coding Code Description I87.303 Chronic venous hypertension (idiopathic) without complications of bilateral lower extremity S81.812D Laceration without foreign body, left lower leg, subsequent encounter L97.222 Non-pressure chronic ulcer of left calf with fat layer exposed L03.116 Cellulitis of left lower limb Follow-up Appointments Return Appointment in 1 week. Dressing Change Frequency Wound #4 Left,Proximal,Medial Lower Leg Do not change entire dressing for one week. Skin Barriers/Peri-Wound Care Moisturizing lotion - to leg TCA Cream or Ointment - mixed with lotion Wound Cleansing May shower with protection. Primary Wound Dressing Wound #4 Left,Proximal,Medial Lower Leg Calcium Alginate Secondary Dressing Wound #4 Left,Proximal,Medial Lower Leg ABD pad Edema Control 3 Layer Compression System - Left Lower Extremity Avoid standing for long periods of time Elevate legs to the level of the heart or above for 30 minutes daily and/or when sitting, a frequency of: - throughout the day Exercise regularly Electronic Signature(s) Signed: 12/17/2019 5:18:21 PM By: Levan Hurst RN, BSN Signed: 12/17/2019 5:34:39 PM By: Linton Ham MD Entered By: Levan Hurst on 12/17/2019 14:33:34 -------------------------------------------------------------------------------- Problem List Details Patient Name: Date of Service: [name] 12/17/2019 1:15 PM Medical Record [id number] Patient Account Number: [id number] Date of Birth/Sex: Treating RN: 01-25-1931 (86 y.o. Kathryn [name] Primary Care Provider: Eliezer Lofts Other Clinician: Referring Provider: Treating Provider/Extender:Lucill Mauck, [name], [name] in Treatment: 15 Active Problems ICD-10 Evaluated Encounter Code Description Active Date Today Diagnosis I87.303 Chronic venous hypertension (idiopathic) without 99991111 No  Yes complications of bilateral lower extremity S81.812D Laceration without foreign body, left lower leg, 08/28/2019 No Yes subsequent encounter L97.222 Non-pressure chronic ulcer of left calf with fat layer 08/28/2019 No Yes exposed L03.116 Cellulitis of left lower limb 09/14/2019 No Yes Inactive Problems Resolved Problems  Electronic Signature(s) Signed: 12/17/2019 5:34:39 PM By: Linton Ham MD Entered By: Linton Ham on 12/17/2019 15:05:45 -------------------------------------------------------------------------------- Progress Note Details Patient Name: Date of Service: [name] 12/17/2019 1:15 PM Medical Record [id number] Patient Account Number: [id number] Date of Birth/Sex: Treating RN: October 06, 1930 (86 y.o. Kathryn [name] Primary Care Provider: Eliezer Lofts Other Clinician: Referring Provider: Treating Provider/Extender:Everton Bertha, [name], [name] in Treatment: 15 Subjective History of Present Illness (HPI) ADMISSION 08/28/19 This is an 86 year old woman who is still very active still working full-time at [employer] Sometime in late September her left calf was hit by the harness of her dog who was chasing another person. She was left with a fairly clear skin tear she tried to pull the skin back over the wound but it it did not maintain. She was seen in her primary doctor's office on 07/20/2019 she was given a topical antibiotic and cephalexin. She was reviewed in primary care on 11/3 and it was felt the wound might need debridement and she was sent here. The patient has chronic venous insufficiency however she does not have a history of recurrent chronic wounds. Past medical history includes cellulitis of the left foot, hyperlipidemia, irregular heart rate, colonic polyps., Low back pain, B12 deficiency, Dupuytren's contractures, recurrent UTIs she has a history of bilateral hip surgery for osteoarthritis ABI in our clinic was 0.94 on the left 12/8;  deep laceration wound on the left medial mid tibia. We are using Iodoflex under compression. She is not eligible for home health 12/15; deep laceration wound on the left mid tibia. We are using Iodoflex under compression. Perhaps some minimal improvement in wound surface certainly no improvement in surface area 12/18; the patient called in to report drainage coming out of the wound and increasing pain. She was brought in for a nurse visit. Noted to have serosanguineous drainage and I was asked to look at this. 12/21; 3-day follow-up. Culture I did on 12/18 grew multiple organisms but none predominated. In spite of this the doxycycline really seems to have helped there is much less surrounding erythema. We allowed her to change the dressing over the weekend. The patient still works at [employer] There is edema around the wound because we did not wrap her last time. I wanted her to be able to continue to monitor the degree of erythema 12/28; the patient is not working and telling me she is staying off her foot is much as possible. She was approved for Apligraf but there was still too much debris over the wound surface today to consider this. Extensive debridement change dressing to Iodoflex 10/01/2019. The patient's wound is in a much better surface this week. We will order the Apligraf but continue Iodoflex for this week 1/11; healthy looking wound surface. Apligraf #1. No evidence of infection 1/25; wound down 1 cm in width and length. Apligraf #2 2/8; the wound is down in length and width. Apligraf #3 2/22; no real change in surface area however the wound certainly looks healthy. Apligraf #4 I will be looking an improvement in surface area next time will switch to an alternative dressing 3/8; the patient's wound is entirely epithelialized. Somewhat raised area either we epithelialized over hyper granulation or this is scar tissue. Nevertheless the area is fragile. 3/15; the patient arrived with  a small wound just underneath the original wound which almost look like a wrap injury or perhaps skin tear while the wrap was being removed. She also has a frail area in the oval-shaped original  wound at roughly 4:00. This may be epithelialized is difficult to be certain. Very small 3/22; the patient arrives with both wounds identified last week including the lower part of her original wound in theo Wrap injury healed however she has a new wound superiorly on the same side of the same leg. She tells me she went out to walk a mile but did not notice anything else different. Objective Constitutional Sitting or standing Blood Pressure is within target range for patient.. Pulse regular and within target range for patient.Marland Kitchen Respirations regular, non-labored and within target range.. Temperature is normal and within the target range for the patient.Marland Kitchen Appears in no distress. Vitals Time Taken: 1:38 PM, Height: 67 in, Weight: 125 lbs, BMI: 19.6, Temperature: 98 F, Pulse: 88 bpm, Respiratory Rate: 18 breaths/min, Blood Pressure: 115/81 mmHg. Cardiovascular Fetal pulses are palpable. Edema control is good chronic venous changes. General Notes: Wound exam; her edema control is good. She has changes of chronic venous insufficiency. Her original wound is totally healed. The satellite area below the original wound from last week is also healed. HOWEVER she has a new wound above her original wound. This does look like a wrap injury. Integumentary (Hair, Skin) Wound #1 status is Healed - Epithelialized. Original cause of wound was Trauma. The wound is located on the Left,Medial Lower Leg. The wound measures 0cm length x 0cm width x 0cm depth; 0cm^2 area and 0cm^3 volume. There is no tunneling or undermining noted. There is a none present amount of drainage noted. The wound margin is distinct with the outline attached to the wound base. There is no granulation within the wound bed. There is no necrotic  tissue within the wound bed. Wound #3 status is Healed - Epithelialized. Original cause of wound was Gradually Appeared. The wound is located on the Left,Distal,Medial Lower Leg. The wound measures 0cm length x 0cm width x 0cm depth; 0cm^2 area and 0cm^3 volume. There is no tunneling or undermining noted. There is a none present amount of drainage noted. The wound margin is distinct with the outline attached to the wound base. There is no granulation within the wound bed. There is no necrotic tissue within the wound bed. Wound #4 status is Open. Original cause of wound was Trauma. The wound is located on the Left,Proximal,Medial Lower Leg. The wound measures 0.8cm length x 0.7cm width x 0.1cm depth; 0.44cm^2 area and 0.044cm^3 volume. There is Fat Layer (Subcutaneous Tissue) Exposed exposed. There is no tunneling or undermining noted. There is a small amount of serosanguineous drainage noted. The wound margin is distinct with the outline attached to the wound base. There is large (67-100%) red, pink granulation within the wound bed. There is no necrotic tissue within the wound bed. Assessment Active Problems ICD-10 Chronic venous hypertension (idiopathic) without complications of bilateral lower extremity Laceration without foreign body, left lower leg, subsequent encounter Non-pressure chronic ulcer of left calf with fat layer exposed Cellulitis of left lower limb Procedures Wound #4 Pre-procedure diagnosis of Wound #4 is a Trauma, Other located on the Left,Proximal,Medial Lower Leg . There was a Three Layer Compression Therapy Procedure by Levan Hurst, RN. Post procedure Diagnosis Wound #4: Same as Pre-Procedure Plan Follow-up Appointments: Return Appointment in 1 week. Dressing Change Frequency: Wound #4 Left,Proximal,Medial Lower Leg: Do not change entire dressing for one week. Skin Barriers/Peri-Wound Care: Moisturizing lotion - to leg TCA Cream or Ointment - mixed with  lotion Wound Cleansing: May shower with protection. Primary Wound Dressing: Wound #4 Left,Proximal,Medial  Lower Leg: Calcium Alginate Secondary Dressing: Wound #4 Left,Proximal,Medial Lower Leg: ABD pad Edema Control: 3 Layer Compression System - Left Lower Extremity Avoid standing for long periods of time Elevate legs to the level of the heart or above for 30 minutes daily and/or when sitting, a frequency of: - throughout the day Exercise regularly 1. We put calcium alginate ABD and put her back in 3 layer compression 2. I have asked her to be relatively inactive this week until we can get this to close Electronic Signature(s) Signed: 12/17/2019 5:34:39 PM By: Linton Ham MD Entered By: Linton Ham on 12/17/2019 15:08:17 -------------------------------------------------------------------------------- SuperBill Details Patient Name: Date of Service: [name] 12/17/2019 Medical Record [id number] Patient Account Number: [id number] Date of Birth/Sex: Treating RN: 01/23/1931 (86 y.o. Kathryn [name] Primary Care Provider: Eliezer Lofts Other Clinician: Referring Provider: Treating Provider/Extender:Antero Derosia, [name], [name] in Treatment: 15 Diagnosis Coding ICD-10 Codes Code Description I87.303 Chronic venous hypertension (idiopathic) without complications of bilateral lower extremity S81.812D Laceration without foreign body, left lower leg, subsequent encounter L97.222 Non-pressure chronic ulcer of left calf with fat layer exposed L03.116 Cellulitis of left lower limb Facility Procedures CPT4 Code Description: [id number] (Facility Use Only) [phone] - [place] [id number] LWR LT LEG Modifier: Quantity: 1 Physician Procedures CPT4 Code Description: [id number] - WC PHYS LEVEL 3 - EST PT ICD-10 Diagnosis Description I87.303 Chronic venous hypertension (idiopathic) without complication 0000000 Chronic venous hypertension (idiopathic) without  complications extremity [id number]  Laceration without foreign body, left lower leg, subsequent en L97.222 Non-pressure chronic ulcer of left calf with fat layer exposed Modifier: s of bilateral l of bilateral lo counter Quantity: 1 ower wer Electronic Signature(s) Signed: 12/17/2019 5:18:21 PM By: Levan Hurst RN, BSN Signed: 12/17/2019 5:34:39 PM By: Linton Ham MD Entered By: Levan Hurst on 12/17/2019 17:06:30

## 2019-12-17 NOTE — Progress Notes (Signed)
Kathryn, [name] ([id number]) Visit Report for 12/17/2019 Arrival Information Details Patient Name: Date of Service: Kathryn [name], Kathryn [name] 12/17/2019 1:15 PM Medical Record [id number] Patient Account Number: [id number] Date of Birth/Sex: Treating RN: 07-Aug-1931 (86 y.o. [name] Primary Care Liadan Guizar: Eliezer Lofts Other Clinician: Referring Clovis Warwick: Treating Maebell Lyvers/Extender:Robson, [name], [name] in Treatment: 15 Visit Information History Since Last Visit Added or deleted any medications: No Patient Arrived: Ambulatory Any new allergies or adverse reactions: No Arrival Time: 13:37 Had a fall or experienced change in No Accompanied By: family activities of daily living that may affect friend risk of falls: Transfer Assistance: None Signs or symptoms of abuse/neglect since last No Patient Identification Verified: Yes visito Secondary Verification Process Completed: Yes Hospitalized since last visit: No Patient Requires Transmission-Based No Implantable device outside of the clinic excluding No Precautions: cellular tissue based products placed in the center Patient Has Alerts: No since last visit: Has Dressing in Place as Prescribed: Yes Has Compression in Place as Prescribed: Yes Pain Present Now: No Electronic Signature(s) Signed: 12/17/2019 5:21:34 PM By: Kela Millin Entered By: Kela Millin on 12/17/2019 13:38:33 -------------------------------------------------------------------------------- Compression Therapy Details Patient Name: Date of Service: Kathryn, [name] 12/17/2019 1:15 PM Medical Record [id number] Patient Account Number: [id number] Date of Birth/Sex: Treating RN: [birth year] (86 y.o. [name] Primary Care Mallory Schaad: Eliezer Lofts Other Clinician: Referring Dyneisha Murchison: Treating Avalynne Diver/Extender:Robson, [name], [name] in Treatment: 15 Compression Therapy Performed for Wound Wound #4  Left,Proximal,Medial Lower Leg Assessment: Performed By: Clinician Levan Hurst, RN Compression Type: Three Layer Post Procedure Diagnosis Same as Pre-procedure Electronic Signature(s) Signed: 12/17/2019 5:18:21 PM By: Levan Hurst RN, BSN Entered By: Levan Hurst on 12/17/2019 14:35:14 -------------------------------------------------------------------------------- Encounter Discharge Information Details Patient Name: Date of Service: Kathryn, [name] 12/17/2019 1:15 PM Medical Record [id number] Patient Account Number: [id number] Date of Birth/Sex: Treating RN: 02/21/1931 (86 y.o. [name] Primary Care Moneisha Vosler: Eliezer Lofts Other Clinician: Referring Haide Klinker: Treating Matther Labell/Extender:Robson, [name], [name] in Treatment: 15 Encounter Discharge Information Items Discharge Condition: Stable Ambulatory Status: Ambulatory Discharge Destination: Home Transportation: Private Auto Accompanied By: friend Schedule Follow-up Appointment: Yes Clinical Summary of Care: Electronic Signature(s) Signed: 12/17/2019 5:27:21 PM By: Deon Pilling Entered By: Deon Pilling on 12/17/2019 14:46:41 -------------------------------------------------------------------------------- Lower Extremity Assessment Details Patient Name: Date of Service: Kathryn, [name] 12/17/2019 1:15 PM Medical Record [id number] Patient Account Number: [id number] Date of Birth/Sex: Treating RN: April 25, 1931 (86 y.o. [name] Primary Care Barkley Kratochvil: Eliezer Lofts Other Clinician: Referring Amenah Tucci: Treating Corwin Kuiken/Extender:Robson, [name], [name] in Treatment: 15 Edema Assessment Assessed: [Left: No] [Right: No] Edema: [Left: N] [Right: o] Calf Left: Right: Point of Measurement: cm From Medial Instep 29 cm cm Ankle Left: Right: Point of Measurement: cm From Medial Instep 20.3 cm cm Vascular Assessment Pulses: Dorsalis Pedis Palpable: [Left:Yes] Electronic  Signature(s) Signed: 12/17/2019 5:21:34 PM By: Kela Millin Entered By: Kela Millin on 12/17/2019 13:42:50 -------------------------------------------------------------------------------- Multi Wound Chart Details Patient Name: Date of Service: Kathryn [name] 12/17/2019 1:15 PM Medical Record [id number] Patient Account Number: [id number] Date of Birth/Sex: Treating RN: 1931-02-26 (86 y.o. [name] Primary Care Kasee Hantz: Eliezer Lofts Other Clinician: Referring Amijah Timothy: Treating Celestino Ackerman/Extender:Robson, [name], [name] in Treatment: 15 Vital Signs Height(in): 67 Pulse(bpm): 88 Weight(lbs): 125 Blood Pressure(mmHg): 115/81 Body Mass Index(BMI): 20 Temperature(F): 98 Respiratory 18 Rate(breaths/min): Photos: [1:No Photos] [3:No Photos] [4:No Photos] Wound Location: [1:Left, Medial Lower Leg] [3:Left, Distal, Medial Lower Leg] [4:Left, Proximal, Medial Lower Leg] Wounding Event: [1:Trauma] [3:Gradually Appeared] [4:Trauma]  Primary Etiology: [1:Venous Leg Ulcer] [3:Trauma, Other] [4:Trauma, Other] Comorbid History: [1:Cataracts, Osteoarthritis, Confinement Anxiety] [3:Cataracts, Osteoarthritis, Confinement Anxiety] [4:Cataracts, Osteoarthritis, Confinement Anxiety] Date Acquired: [1:05/29/2019] [3:12/10/2019] [4:12/17/2019] Weeks of Treatment: [1:15] [3:1] [4:0] Wound Status: [1:Healed - Epithelialized] [3:Healed - Epithelialized] [4:Open] Measurements L x W x D 0x0x0 [3:0x0x0] [4:0.8x0.7x0.1] (cm) Area (cm) : [1:0] [3:0] [4:0.44] Volume (cm) : [1:0] [3:0] [4:0.044] % Reduction in Area: [1:100.00%] [3:100.00%] [4:0.00%] % Reduction in Volume: 100.00% [3:100.00%] [4:0.00%] Classification: [1:Full Thickness Without Exposed Support Structures Exposed Support Structures Exposed Support Structures] [3:Full Thickness Without] [4:Full Thickness Without] Exudate Amount: [1:None Present] [3:None Present] [4:Small] Exudate Type: [1:N/A] [3:N/A]  [4:Serosanguineous] Exudate Color: [1:N/A] [3:N/A] [4:red, brown] Wound Margin: [1:Distinct, outline attached Distinct, outline attached Distinct, outline attached] Granulation Amount: [1:None Present (0%)] [3:None Present (0%)] [4:Large (67-100%)] Granulation Quality: [1:N/A] [3:N/A] [4:Red, Pink] Necrotic Amount: [1:None Present (0%)] [3:None Present (0%)] [4:None Present (0%)] Exposed Structures: [1:Fascia: No Fat Layer (Subcutaneous Fat Layer (Subcutaneous Tissue) Exposed: Yes Tissue) Exposed: No Tendon: No Muscle: No Joint: No Bone: No] [3:Fascia: No Tissue) Exposed: No Tendon: No Muscle: No Joint: No Bone: No] [4:Fat Layer (Subcutaneous  Fascia: No Tendon: No Muscle: No Joint: No Bone: No] Epithelialization: [1:Large (67-100%) N/A] [3:None N/A] [4:None Compression Therapy] Treatment Notes Wound #4 (Left, Proximal, Medial Lower Leg) 1. Cleanse With Wound Cleanser Soap and water 2. Periwound Care Moisturizing lotion TCA Cream 3. Primary Dressing Applied Calcium Alginate 4. Secondary Dressing Dry Gauze 6. Support Layer Applied 3 layer compression wrap Notes netting. Electronic Signature(s) Signed: 12/17/2019 5:18:21 PM By: Levan Hurst RN, BSN Signed: 12/17/2019 5:34:39 PM By: Linton Ham MD Entered By: Linton Ham on 12/17/2019 15:05:52 -------------------------------------------------------------------------------- Multi-Disciplinary Care Plan Details Patient Name: Date of Service: LAPARIS, DURRETT 12/17/2019 1:15 PM Medical Record [id number] Patient Account Number: [id number] Date of Birth/Sex: Treating RN: 1931-01-22 (86 y.o. [name] Primary Care Margean Korell: Eliezer Lofts Other Clinician: Referring Kathie Posa: Treating Mikhael Hendriks/Extender:Robson, [name], [name] in Treatment: 15 Active Inactive Wound/Skin Impairment Nursing Diagnoses: Knowledge deficit related to ulceration/compromised skin integrity Goals: Patient/caregiver will  verbalize understanding of skin care regimen Date Initiated: 08/28/2019 Target Resolution Date: 01/18/2020 Goal Status: Active Ulcer/skin breakdown will have a volume reduction of 30% by week 4 Date Initiated: 08/28/2019 Date Inactivated: 10/01/2019 Target Resolution Date: 10/05/2019 Goal Status: Met Ulcer/skin breakdown will have a volume reduction of 50% by week 8 Date Initiated: 10/01/2019 Date Inactivated: 11/19/2019 Target Resolution Date: 11/02/2019 Goal Status: Met Interventions: Assess patient/caregiver ability to obtain necessary supplies Assess patient/caregiver ability to perform ulcer/skin care regimen upon admission and as needed Assess ulceration(s) every visit Notes: Electronic Signature(s) Signed: 12/17/2019 5:18:21 PM By: Levan Hurst RN, BSN Entered By: Levan Hurst on 12/17/2019 13:59:49 -------------------------------------------------------------------------------- Pain Assessment Details Patient Name: Date of Service: Kathryn [name] 12/17/2019 1:15 PM Medical Record [id number] Patient Account Number: [id number] Date of Birth/Sex: Treating RN: 13-Sep-1931 (86 y.o. [name] Primary Care Minas Bonser: Eliezer Lofts Other Clinician: Referring Elba Dendinger: Treating Justine Cossin/Extender:Robson, [name], [name] in Treatment: 15 Active Problems Location of Pain Severity and Description of Pain Patient Has Paino No Site Locations Pain Management and Medication Current Pain Management: Electronic Signature(s) Signed: 12/17/2019 5:21:34 PM By: Kela Millin Entered By: Kela Millin on 12/17/2019 13:40:24 -------------------------------------------------------------------------------- Patient/Caregiver Education Details Patient Name: Date of Service: Kathryn [name] 3/22/2021andnbsp1:15 PM Medical Record [id number] Patient Account Number: [id number] Date of Birth/Gender: Sep 13, 1931 (86 y.o. F) Treating RN: Levan Hurst Primary Care  Physician: Eliezer Lofts Other Clinician: Referring Physician: Treating Physician/Extender:Robson, Legrand Como  Diona Browner [name] in Treatment: 15 Education Assessment Education Provided To: Patient Education Topics Provided Wound/Skin Impairment: Methods: Explain/Verbal Responses: State content correctly [employer]) Signed: 12/17/2019 5:18:21 PM By: Levan Hurst RN, BSN Entered By: Levan Hurst on 12/17/2019 14:00:06 -------------------------------------------------------------------------------- Wound Assessment Details Patient Name: Date of Service: ANNALIESA, BLANN 12/17/2019 1:15 PM Medical Record [id number] Patient Account Number: [id number] Date of Birth/Sex: Treating RN: 1930-11-22 (86 y.o. [name] Primary Care Provider: Eliezer Lofts Other Clinician: Referring Provider: Treating Provider/Extender:Robson, [name], [name] in Treatment: 15 Wound Status Wound Number: 1 Primary Venous Leg Ulcer Etiology: Wound Location: Left, Medial Lower Leg Wound Status: Healed - Epithelialized Wounding Event: Trauma Comorbid Cataracts, Osteoarthritis, Confinement Date Acquired: 05/29/2019 History: Anxiety Weeks Of Treatment: 15 Clustered Wound: No Wound Measurements Length: (cm) 0 % Reduct Width: (cm) 0 % Reduct Depth: (cm) 0 Epitheli Area: (cm) 0 Tunneli Volume: (cm) 0 Undermi Wound Description Full Thickness Without Exposed Support Foul Odo Classification: Structures Slough/F Wound Distinct, outline attached Margin: Exudate None Present Amount: Wound Bed Granulation Amount: None Present (0%) Necrotic Amount: None Present (0%) Fascia E Fat Laye Tendon E Muscle E Joint Ex Bone Exp r After Cleansing: No ibrino No Exposed Structure xposed: No r (Subcutaneous Tissue) Exposed: No xposed: No xposed: No posed: No osed: No ion in Area: 100% ion in Volume: 100% alization: Large (67-100%) ng: No ning: No Electronic  Signature(s) Signed: 12/17/2019 5:18:21 PM By: Levan Hurst RN, BSN Entered By: Levan Hurst on 12/17/2019 14:32:52 -------------------------------------------------------------------------------- Wound Assessment Details Patient Name: Date of Service: Kathryn [name] 12/17/2019 1:15 PM Medical Record [id number] Patient Account Number: [id number] Date of Birth/Sex: Treating RN: [birth year] (86 y.o. [name] Primary Care Provider: Eliezer Lofts Other Clinician: Referring Provider: Treating Provider/Extender:Robson, [name], [name] in Treatment: 15 Wound Status Wound Number: 3 Primary Trauma, Other Etiology: Wound Location: Left, Distal, Medial Lower Leg Wound Status: Healed - Epithelialized Wounding Event: Gradually Appeared Comorbid Cataracts, Osteoarthritis, Confinement Date Acquired: 12/10/2019 History: Anxiety Weeks Of Treatment: 1 Clustered Wound: No Wound Measurements Length: (cm) 0 % Reduct Width: (cm) 0 % Reduct Depth: (cm) 0 Epitheli Area: (cm) 0 Tunneli Volume: (cm) 0 Undermi Wound Description Full Thickness Without Exposed Support Foul Odo Classification: Structures Slough/F Wound Distinct, outline attached Margin: Exudate None Present Amount: Wound Bed Granulation Amount: None Present (0%) Necrotic Amount: None Present (0%) Fascia E Fat Laye Tendon E Muscle E Joint Ex Bone Exp r After Cleansing: No ibrino No Exposed Structure xposed: No r (Subcutaneous Tissue) Exposed: No xposed: No xposed: No posed: No osed: No ion in Area: 100% ion in Volume: 100% alization: None ng: No ning: No Electronic Signature(s) Signed: 12/17/2019 5:18:21 PM By: Levan Hurst RN, BSN Entered By: Levan Hurst on 12/17/2019 14:32:53 -------------------------------------------------------------------------------- Wound Assessment Details Patient Name: Date of Service: Kathryn [name] 12/17/2019 1:15 PM Medical Record [id number]  Patient Account Number: [id number] Date of Birth/Sex: Treating RN: Nov 30, 1930 (86 y.o. [name] Primary Care Provider: Eliezer Lofts Other Clinician: Referring Provider: Treating Provider/Extender:Robson, [name], [name] in Treatment: 15 Wound Status Wound Number: 4 Primary Trauma, Other Etiology: Wound Location: Left, Proximal, Medial Lower Leg Wound Status: Open Wounding Event: Trauma Comorbid Cataracts, Osteoarthritis, Confinement Date Acquired: 12/17/2019 History: Anxiety Weeks Of Treatment: 0 Clustered Wound: No Wound Measurements Length: (cm) 0.8 % Reduct Width: (cm) 0.7 % Reduct Depth: (cm) 0.1 Epitheli Area: (cm) 0.44 Tunneli Volume: (cm) 0.044 Undermi Wound Description Full Thickness Without Exposed Support Foul Odo Classification: Structures Slough/F  Wound Distinct, outline attached Margin: Exudate Small Amount: Exudate Serosanguineous Type: Exudate red, brown Color: Wound Bed Granulation Amount: Large (67-100%) Granulation Quality: Red, Pink Fascia E Necrotic Amount: None Present (0%) Fat Laye Tendon E Muscle E Joint Ex Bone Exp r After Cleansing: No ibrino No Exposed Structure xposed: No r (Subcutaneous Tissue) Exposed: Yes xposed: No xposed: No posed: No osed: No ion in Area: 0% ion in Volume: 0% alization: None ng: No ning: No Treatment Notes Wound #4 (Left, Proximal, Medial Lower Leg) 1. Cleanse With Wound Cleanser Soap and water 2. Periwound Care Moisturizing lotion TCA Cream 3. Primary Dressing Applied Calcium Alginate 4. Secondary Dressing Dry Gauze 6. Support Layer Applied 3 layer compression wrap Notes netting. Electronic Signature(s) Signed: 12/17/2019 5:18:21 PM By: Levan Hurst RN, BSN Entered By: Levan Hurst on 12/17/2019 14:32:53 -------------------------------------------------------------------------------- [place] Details Patient Name: Date of Service: Kathryn [name] 12/17/2019 1:15  PM Medical Record [id number] Patient Account Number: [id number] Date of Birth/Sex: Treating RN: 09-15-1931 (86 y.o. [name] Primary Care Provider: Eliezer Lofts Other Clinician: Referring Provider: Treating Provider/Extender:Robson, [name], [name] in Treatment: 15 Vital Signs Time Taken: 13:38 Temperature (F): 98 Height (in): 67 Pulse (bpm): 88 Weight (lbs): 125 Respiratory Rate (breaths/min): 18 Body Mass Index (BMI): 19.6 Blood Pressure (mmHg): 115/81 Reference Range: 80 - 120 mg / dl Electronic Signature(s) Signed: 12/17/2019 5:21:34 PM By: Kela Millin Entered By: Kela Millin on 12/17/2019 13:40:18

## 2019-12-24 ENCOUNTER — Other Ambulatory Visit: Payer: Self-pay

## 2019-12-24 ENCOUNTER — Encounter (HOSPITAL_BASED_OUTPATIENT_CLINIC_OR_DEPARTMENT_OTHER): Payer: Medicare Other | Admitting: Internal Medicine

## 2019-12-24 DIAGNOSIS — L97822 Non-pressure chronic ulcer of other part of left lower leg with fat layer exposed: Secondary | ICD-10-CM | POA: Diagnosis not present

## 2019-12-24 DIAGNOSIS — S81812A Laceration without foreign body, left lower leg, initial encounter: Secondary | ICD-10-CM | POA: Diagnosis not present

## 2019-12-24 DIAGNOSIS — L03116 Cellulitis of left lower limb: Secondary | ICD-10-CM | POA: Diagnosis not present

## 2019-12-24 DIAGNOSIS — I872 Venous insufficiency (chronic) (peripheral): Secondary | ICD-10-CM | POA: Diagnosis not present

## 2019-12-24 DIAGNOSIS — Z09 Encounter for follow-up examination after completed treatment for conditions other than malignant neoplasm: Secondary | ICD-10-CM | POA: Diagnosis not present

## 2019-12-24 DIAGNOSIS — I87303 Chronic venous hypertension (idiopathic) without complications of bilateral lower extremity: Secondary | ICD-10-CM | POA: Diagnosis not present

## 2019-12-24 NOTE — Progress Notes (Signed)
Kathryn [name], Kathryn [name] ([id number]) Visit Report for 12/24/2019 HPI Details Patient Name: Date of Service: Kathryn [name], Kathryn [name] 12/24/2019 1:30 PM Medical Record [id number] Patient Account Number: [id number] Date of Birth/Sex: Treating RN: [birth year] (86 y.o. Kathryn [name] Primary Care Provider: Eliezer [name] Other Clinician: Referring Provider: Treating Provider/Extender:Kathryn [name], Kathryn [name], Kathryn [name] in Treatment: 16 History of Present Illness HPI Description: ADMISSION 08/28/19 This is an 86 year old woman who is still very active still working full-time at [employer] Sometime in late September her left calf was hit by the harness of her dog who was chasing another person. She was left with a fairly clear skin tear she tried to pull the skin back over the wound but it it did not maintain. She was seen in her primary doctor's office on 07/20/2019 she was given a topical antibiotic and cephalexin. She was reviewed in primary care on 11/3 and it was felt the wound might need debridement and she was sent here. The patient has chronic venous insufficiency however she does not have a history of recurrent chronic wounds. Past medical history includes cellulitis of the left foot, hyperlipidemia, irregular heart rate, colonic polyps., Low back pain, B12 deficiency, Dupuytren's contractures, recurrent UTIs she has a history of bilateral hip surgery for osteoarthritis ABI in our clinic was 0.94 on the left 12/8; deep laceration wound on the left medial mid tibia. We are using Iodoflex under compression. She is not eligible for home health 12/15; deep laceration wound on the left mid tibia. We are using Iodoflex under compression. Perhaps some minimal improvement in wound surface certainly no improvement in surface area 12/18; the patient called in to report drainage coming out of the wound and increasing pain. She was brought in for a nurse visit. Noted to have serosanguineous drainage and I was  asked to look at this. 12/21; 3-day follow-up. Culture I did on 12/18 grew multiple organisms but none predominated. In spite of this the doxycycline really seems to have helped there is much less surrounding erythema. We allowed her to change the dressing over the weekend. The patient still works at [employer] There is edema around the wound because we did not wrap her last time. I wanted her to be able to continue to monitor the degree of erythema 12/28; the patient is not working and telling me she is staying off her foot is much as possible. She was approved for Apligraf but there was still too much debris over the wound surface today to consider this. Extensive debridement change dressing to Iodoflex 10/01/2019. The patient's wound is in a much better surface this week. We will order the Apligraf but continue Iodoflex for this week 1/11; healthy looking wound surface. Apligraf #1. No evidence of infection 1/25; wound down 1 cm in width and length. Apligraf #2 2/8; the wound is down in length and width. Apligraf #3 2/22; no real change in surface area however the wound certainly looks healthy. Apligraf #4 I will be looking an improvement in surface area next time will switch to an alternative dressing 3/8; the patient's wound is entirely epithelialized. Somewhat raised area either we epithelialized over hyper granulation or this is scar tissue. Nevertheless the area is fragile. 3/15; the patient arrived with a small wound just underneath the original wound which almost look like a wrap injury or perhaps skin tear while the wrap was being removed. She also has a frail area in the oval-shaped original wound at roughly 4:00. This may be epithelialized is difficult to be  certain. Very small 3/22; the patient arrives with both wounds identified last week including the lower part of her original wound in theo Wrap injury healed however she has a new wound superiorly on the same side of the same  leg. She tells me she went out to walk a mile but did not notice anything else different. 3/29; the patient arrives today with everything completely closed. This includes the original wound and the surrounding trauma injuries. She has significant chronic venous insufficiency Electronic Signature(s) Signed: 12/24/2019 5:31:36 PM By: Kathryn Ham MD Entered By: Kathryn [name] on 12/24/2019 15:17:26 -------------------------------------------------------------------------------- Physical Exam Details Patient Name: Date of Service: Kathryn [name] 12/24/2019 1:30 PM Medical Record [id number] Patient Account Number: [id number] Date of Birth/Sex: Treating RN: Nov 26, 1930 (86 y.o. Kathryn [name] Primary Care Provider: Eliezer [name] Other Clinician: Referring Provider: Treating Provider/Extender:Kathryn [name], Kathryn [name], Kathryn [name] in Treatment: 16 Constitutional Patient is hypertensive.. Pulse regular and within target range for patient.Marland Kitchen Respirations regular, non-labored and within target range.. Temperature is normal and within the target range for the patient.Marland Kitchen Appears in no distress. Cardiovascular Pedal pulses palpable. Chronic venous insufficiency changes clear changes of venous hypertension. Notes Wound exam; her edema control is good however she has changes of chronic venous insufficiency. All of her wounds are closed including the original wound on the right lateral mid calf, the satellite lesion below this number 2 [name] ago and the new wound above the area last time. This was likely a wrap injury. Electronic Signature(s) Signed: 12/24/2019 5:31:36 PM By: Kathryn Ham MD Entered By: Kathryn [name] on 12/24/2019 15:18:37 -------------------------------------------------------------------------------- Physician Orders Details Patient Name: Date of Service: Kathryn [name] 12/24/2019 1:30 PM Medical Record [id number] Patient Account Number: [id number] Date of  Birth/Sex: Treating RN: 10/08/1930 (86 y.o. Kathryn [name], Kathryn [name] Primary Care Provider: Eliezer [name] Other Clinician: Referring Provider: Treating Provider/Extender:Eloni Darius, Kathryn [name], Kathryn [name] in Treatment: 43 Verbal / Phone Orders: No Diagnosis Coding ICD-10 Coding Code Description I87.303 Chronic venous hypertension (idiopathic) without complications of bilateral lower extremity S81.812D Laceration without foreign body, left lower leg, subsequent encounter L97.222 Non-pressure chronic ulcer of left calf with fat layer exposed L03.116 Cellulitis of left lower limb Discharge From [facility] Services Discharge from [place] lotion - to legs daily Edema Control Avoid standing for long periods of time Elevate legs to the level of the heart or above for 30 minutes daily and/or when sitting, a frequency of: - throughout the day Exercise regularly Support Garment 10-20 mm/Hg pressure to: - support hose both legs daily Electronic Signature(s) Signed: 12/24/2019 5:31:36 PM By: Kathryn Ham MD Signed: 12/24/2019 5:31:53 PM By: Baruch Gouty RN, BSN Entered By: Baruch Gouty on 12/24/2019 14:45:18 -------------------------------------------------------------------------------- Problem List Details Patient Name: Date of Service: Kathryn [name] 12/24/2019 1:30 PM Medical Record [id number] Patient Account Number: [id number] Date of Birth/Sex: Treating RN: 1931-02-28 (86 y.o. Kathryn [name], Kathryn [name] Primary Care Provider: Eliezer [name] Other Clinician: Referring Provider: Treating Provider/Extender:Cicilia Clinger, Kathryn [name], Kathryn [name] in Treatment: 16 Active Problems ICD-10 Evaluated Encounter Code Description Active Date Today Diagnosis I87.303 Chronic venous hypertension (idiopathic) without 99991111 No Yes complications of bilateral lower extremity S81.812D Laceration without foreign body, left lower leg, 08/28/2019 No  Yes subsequent encounter L97.222 Non-pressure chronic ulcer of left calf with fat layer 08/28/2019 No Yes exposed L03.116 Cellulitis of left lower limb 09/14/2019 No Yes Inactive Problems Resolved Problems Electronic Signature(s) Signed: 12/24/2019 5:31:36 PM By: Kathryn Ham MD Entered By: Kathryn [name] on 12/24/2019 15:16:37 -------------------------------------------------------------------------------- Progress  Note Details Patient Name: Date of Service: Kathryn [name], Kathryn [name] 12/24/2019 1:30 PM Medical Record [id number] Patient Account Number: [id number] Date of Birth/Sex: Treating RN: [birth year] (86 y.o. Kathryn [name] Primary Care Provider: Eliezer [name] Other Clinician: Referring Provider: Treating Provider/Extender:Arayna Illescas, Kathryn [name], Kathryn [name] in Treatment: 16 Subjective History of Present Illness (HPI) ADMISSION 08/28/19 This is an 86 year old woman who is still very active still working full-time at [employer] Sometime in late September her left calf was hit by the harness of her dog who was chasing another person. She was left with a fairly clear skin tear she tried to pull the skin back over the wound but it it did not maintain. She was seen in her primary doctor's office on 07/20/2019 she was given a topical antibiotic and cephalexin. She was reviewed in primary care on 11/3 and it was felt the wound might need debridement and she was sent here. The patient has chronic venous insufficiency however she does not have a history of recurrent chronic wounds. Past medical history includes cellulitis of the left foot, hyperlipidemia, irregular heart rate, colonic polyps., Low back pain, B12 deficiency, Dupuytren's contractures, recurrent UTIs she has a history of bilateral hip surgery for osteoarthritis ABI in our clinic was 0.94 on the left 12/8; deep laceration wound on the left medial mid tibia. We are using Iodoflex under compression. She is not eligible for  home health 12/15; deep laceration wound on the left mid tibia. We are using Iodoflex under compression. Perhaps some minimal improvement in wound surface certainly no improvement in surface area 12/18; the patient called in to report drainage coming out of the wound and increasing pain. She was brought in for a nurse visit. Noted to have serosanguineous drainage and I was asked to look at this. 12/21; 3-day follow-up. Culture I did on 12/18 grew multiple organisms but none predominated. In spite of this the doxycycline really seems to have helped there is much less surrounding erythema. We allowed her to change the dressing over the weekend. The patient still works at [employer] There is edema around the wound because we did not wrap her last time. I wanted her to be able to continue to monitor the degree of erythema 12/28; the patient is not working and telling me she is staying off her foot is much as possible. She was approved for Apligraf but there was still too much debris over the wound surface today to consider this. Extensive debridement change dressing to Iodoflex 10/01/2019. The patient's wound is in a much better surface this week. We will order the Apligraf but continue Iodoflex for this week 1/11; healthy looking wound surface. Apligraf #1. No evidence of infection 1/25; wound down 1 cm in width and length. Apligraf #2 2/8; the wound is down in length and width. Apligraf #3 2/22; no real change in surface area however the wound certainly looks healthy. Apligraf #4 I will be looking an improvement in surface area next time will switch to an alternative dressing 3/8; the patient's wound is entirely epithelialized. Somewhat raised area either we epithelialized over hyper granulation or this is scar tissue. Nevertheless the area is fragile. 3/15; the patient arrived with a small wound just underneath the original wound which almost look like a wrap injury or perhaps skin tear while the  wrap was being removed. She also has a frail area in the oval-shaped original wound at roughly 4:00. This may be epithelialized is difficult to be certain. Very small 3/22; the patient arrives  with both wounds identified last week including the lower part of her original wound in theo Wrap injury healed however she has a new wound superiorly on the same side of the same leg. She tells me she went out to walk a mile but did not notice anything else different. 3/29; the patient arrives today with everything completely closed. This includes the original wound and the surrounding trauma injuries. She has significant chronic venous insufficiency Objective Constitutional Patient is hypertensive.. Pulse regular and within target range for patient.Marland Kitchen Respirations regular, non-labored and within target range.. Temperature is normal and within the target range for the patient.Marland Kitchen Appears in no distress. Vitals Time Taken: 2:00 PM, Height: 67 in, Weight: 125 lbs, BMI: 19.6, Temperature: 98 F, Pulse: 72 bpm, Respiratory Rate: 18 breaths/min, Blood Pressure: 140/74 mmHg. Cardiovascular Pedal pulses palpable. Chronic venous insufficiency changes clear changes of venous hypertension. General Notes: Wound exam; her edema control is good however she has changes of chronic venous insufficiency. All of her wounds are closed including the original wound on the right lateral mid calf, the satellite lesion below this number 2 [name] ago and the new wound above the area last time. This was likely a wrap injury. Integumentary (Hair, Skin) Wound #4 status is Healed - Epithelialized. Original cause of wound was Trauma. The wound is located on the Left,Proximal,Medial Lower Leg. The wound measures 0cm length x 0cm width x 0cm depth; 0cm^2 area and 0cm^3 volume. There is no tunneling or undermining noted. There is a none present amount of drainage noted. The wound margin is distinct with the outline attached to the wound  base. There is no granulation within the wound bed. There is no necrotic tissue within the wound bed. Assessment Active Problems ICD-10 Chronic venous hypertension (idiopathic) without complications of bilateral lower extremity Laceration without foreign body, left lower leg, subsequent encounter Non-pressure chronic ulcer of left calf with fat layer exposed Cellulitis of left lower limb Plan Discharge From [facility] Services: Discharge from [place] Skin Barriers/Peri-Wound Care: Moisturizing lotion - to legs daily Edema Control: Avoid standing for long periods of time Elevate legs to the level of the heart or above for 30 minutes daily and/or when sitting, a frequency of: - throughout the day Exercise regularly Support Garment 10-20 mm/Hg pressure to: - support hose both legs daily 1. The patient can be discharged from the wound care center. 2. I did talk to her about stockings. She would not agree to it. I finally suggested at least support hose however she would not agree to any of this. Electronic Signature(s) Signed: 12/24/2019 5:31:36 PM By: Kathryn Ham MD Entered By: Kathryn [name] on 12/24/2019 15:19:37 -------------------------------------------------------------------------------- SuperBill Details Patient Name: Date of Service: Kathryn [name] 12/24/2019 Medical Record [id number] Patient Account Number: [id number] Date of Birth/Sex: Treating RN: 1931-05-16 (86 y.o. Kathryn [name], Kathryn [name] Primary Care Provider: Eliezer [name] Other Clinician: Referring Provider: Treating Provider/Extender:Christeena Krogh, Kathryn [name], Kathryn [name] in Treatment: 16 Diagnosis Coding ICD-10 Codes Code Description I87.303 Chronic venous hypertension (idiopathic) without complications of bilateral lower extremity S81.812D Laceration without foreign body, left lower leg, subsequent encounter L97.222 Non-pressure chronic ulcer of left calf with fat layer exposed L03.116 Cellulitis of  left lower limb Facility Procedures CPT4 Code: [id number] Description: [phone] - WOUND CARE VISIT-LEV 2 EST PT Modifier: Quantity: 1 Physician Procedures CPT4 Code Description: [id number] - WC PHYS LEVEL 2 - EST PT ICD-10 Diagnosis Description I87.303 Chronic venous hypertension (idiopathic) without complic extremity [id number] Laceration without foreign body, left  lower leg, subsequ L97.222 Non-pressure  chronic ulcer of left calf with fat layer e Modifier: ations of bilat ent encounter xposed Quantity: 1 eral lower Electronic Signature(s) Signed: 12/24/2019 5:31:36 PM By: Kathryn Ham MD Entered By: Kathryn [name] on 12/24/2019 15:19:52

## 2019-12-28 NOTE — Progress Notes (Signed)
Kathryn [name], Kathryn [name] ([id number]) Visit Report for 12/24/2019 Arrival Information Details Patient Name: Date of Service: Kathryn [name], Kathryn [name] 12/24/2019 1:30 PM Medical Record [id number] Patient Account Number: [id number] Date of Birth/Sex: Treating RN: 1931-02-09 (86 y.o. Kathryn [name] Primary Care Quavion Boule: Eliezer Lofts Other Clinician: Referring Deanndra Kirley: Treating Xai Frerking/Extender:Robson, [name], [name] in Treatment: 16 Visit Information History Since Last Visit Added or deleted any medications: No Patient Arrived: Ambulatory Any new allergies or adverse reactions: No Arrival Time: 14:13 Had a fall or experienced change in No Accompanied By: self activities of daily living that may affect Transfer Assistance: None risk of falls: Patient Identification Verified: Yes Signs or symptoms of abuse/neglect since last No Secondary Verification Process Completed: Yes visito Patient Requires Transmission-Based No Hospitalized since last visit: No Precautions: Implantable device outside of the clinic excluding No Patient Has Alerts: No cellular tissue based products placed in the center since last visit: Has Dressing in Place as Prescribed: Yes Has Compression in Place as Prescribed: Yes Pain Present Now: No Electronic Signature(s) Signed: 12/24/2019 5:06:29 PM By: Kela Millin Entered By: Kela Millin on 12/24/2019 14:13:59 -------------------------------------------------------------------------------- Clinic Level of Care Assessment Details Patient Name: Date of Service: Kathryn [name] 12/24/2019 1:30 PM Medical Record [id number] Patient Account Number: [id number] Date of Birth/Sex: Treating RN: December 06, 1930 (86 y.o. Kathryn [name] Primary Care Louis Gaw: Eliezer Lofts Other Clinician: Referring Kaedon Fanelli: Treating Chevette Fee/Extender:Robson, [name], [name] in Treatment: 16 Clinic Level of Care Assessment Items TOOL 4 Quantity  Score []  - Use when only an EandM is performed on FOLLOW-UP visit 0 ASSESSMENTS - Nursing Assessment / Reassessment X - Reassessment of Co-morbidities (includes updates in patient status) 1 10 X - Reassessment of Adherence to Treatment Plan 1 5 ASSESSMENTS - Wound and Skin Assessment / Reassessment X - Simple Wound Assessment / Reassessment - one wound 1 5 []  - Complex Wound Assessment / Reassessment - multiple wounds 0 []  - Dermatologic / Skin Assessment (not related to wound area) 0 ASSESSMENTS - Focused Assessment []  - Circumferential Edema Measurements - multi extremities 0 []  - Nutritional Assessment / Counseling / Intervention 0 X - Lower Extremity Assessment (monofilament, tuning fork, pulses) 1 5 []  - Peripheral Arterial Disease Assessment (using hand held doppler) 0 ASSESSMENTS - Ostomy and/or Continence Assessment and Care []  - Incontinence Assessment and Management 0 []  - Ostomy Care Assessment and Management (repouching, etc.) 0 PROCESS - Coordination of Care X - Simple Patient / Family Education for ongoing care 1 15 []  - Complex (extensive) Patient / Family Education for ongoing care 0 X - Staff obtains Programmer, systems, Records, Test Results / Process Orders 1 10 []  - Staff telephones HHA, Nursing Homes / Clarify orders / etc 0 []  - Routine Transfer to another Facility (non-emergent condition) 0 []  - Routine Hospital Admission (non-emergent condition) 0 []  - New Admissions / Biomedical engineer / Ordering NPWT, Apligraf, etc. 0 []  - Emergency Hospital Admission (emergent condition) 0 X - Simple Discharge Coordination 1 10 []  - Complex (extensive) Discharge Coordination 0 PROCESS - Special Needs []  - Pediatric / Minor Patient Management 0 []  - Isolation Patient Management 0 []  - Hearing / Language / Visual special needs 0 []  - Assessment of Community assistance (transportation, D/C planning, etc.) 0 []  - Additional assistance / Altered mentation 0 []  - Support Surface(s)  Assessment (bed, cushion, seat, etc.) 0 INTERVENTIONS - Wound Cleansing / Measurement X - Simple Wound Cleansing - one wound 1 5 []  - Complex Wound Cleansing - multiple wounds  0 X - Wound Imaging (photographs - any number of wounds) 1 5 []  - Wound Tracing (instead of photographs) 0 []  - Simple Wound Measurement - one wound 0 []  - Complex Wound Measurement - multiple wounds 0 INTERVENTIONS - Wound Dressings []  - Small Wound Dressing one or multiple wounds 0 []  - Medium Wound Dressing one or multiple wounds 0 []  - Large Wound Dressing one or multiple wounds 0 []  - Application of Medications - topical 0 []  - Application of Medications - injection 0 INTERVENTIONS - Miscellaneous []  - External ear exam 0 []  - Specimen Collection (cultures, biopsies, blood, body fluids, etc.) 0 []  - Specimen(s) / Culture(s) sent or taken to Lab for analysis 0 []  - Patient Transfer (multiple staff / Civil Service fast streamer / Similar devices) 0 []  - Simple Staple / Suture removal (25 or less) 0 []  - Complex Staple / Suture removal (26 or more) 0 []  - Hypo / Hyperglycemic Management (close monitor of Blood Glucose) 0 []  - Ankle / Brachial Index (ABI) - do not check if billed separately 0 []  - Vital Signs 0 Has the patient been seen at the hospital within the last three years: Yes Total Score: 70 Level Of Care: New/Established - Level 2 Electronic Signature(s) Signed: 12/24/2019 5:31:53 PM By: Baruch Gouty RN, BSN Entered By: Baruch Gouty on 12/24/2019 14:45:49 -------------------------------------------------------------------------------- Encounter Discharge Information Details Patient Name: Date of Service: Kathryn [name] 12/24/2019 1:30 PM Medical Record [id number] Patient Account Number: [id number] Date of Birth/Sex: Treating RN: 1930-11-02 (86 y.o. Kathryn [name] Primary Care Judianne Seiple: Eliezer Lofts Other Clinician: Referring Kobe Jansma: Treating Charmine Bockrath/Extender:Robson, [name],  [name] in Treatment: 16 Encounter Discharge Information Items Discharge Condition: Stable Ambulatory Status: Ambulatory Discharge Destination: Home Transportation: Private Auto Accompanied By: self Schedule Follow-up Appointment: Yes Clinical Summary of Care: Patient Declined Electronic Signature(s) Signed: 12/24/2019 5:31:53 PM By: Baruch Gouty RN, BSN Entered By: Baruch Gouty on 12/24/2019 14:47:11 -------------------------------------------------------------------------------- Lower Extremity Assessment Details Patient Name: Date of Service: Kathryn [name], Kathryn [name] 12/24/2019 1:30 PM Medical Record [id number] Patient Account Number: [id number] Date of Birth/Sex: Treating RN: 1930-12-01 (86 y.o. Kathryn [name] Primary Care Naftali Carchi: Eliezer Lofts Other Clinician: Referring Vaniya Augspurger: Treating Wess Baney/Extender:Robson, [name], [name] in Treatment: 16 Edema Assessment Assessed: [Left: No] [Right: No] Edema: [Left: N] [Right: o] Calf Left: Right: Point of Measurement: cm From Medial Instep 29 cm cm Ankle Left: Right: Point of Measurement: cm From Medial Instep 20.3 cm cm Vascular Assessment Pulses: Dorsalis Pedis Palpable: [Left:Yes] Electronic Signature(s) Signed: 12/24/2019 5:06:29 PM By: Kela Millin Entered By: Kela Millin on 12/24/2019 14:14:43 -------------------------------------------------------------------------------- Multi Wound Chart Details Patient Name: Date of Service: Kathryn [name] 12/24/2019 1:30 PM Medical Record [id number] Patient Account Number: [id number] Date of Birth/Sex: Treating RN: October 29, 1930 (86 y.o. [name] Primary Care Rubby Barbary: Eliezer Lofts Other Clinician: Referring Janyla Biscoe: Treating Zerina Hallinan/Extender:Robson, [name], [name] in Treatment: 16 Vital Signs Height(in): 67 Pulse(bpm): 72 Weight(lbs): 125 Blood Pressure(mmHg): 140/74 Body Mass Index(BMI): 20 Temperature(F):  98 Respiratory 18 Rate(breaths/min): Photos: [4:No Photos] [N/A:N/A] Wound Location: [4:Left, Proximal, Medial Lower N/A Leg] Wounding Event: [4:Trauma] [N/A:N/A] Primary Etiology: [4:Trauma, Other] [N/A:N/A] Comorbid History: [4:Cataracts, Osteoarthritis, N/A Confinement Anxiety] Date Acquired: [4:12/17/2019] [N/A:N/A] Weeks of Treatment: [4:1] [N/A:N/A] Wound Status: [4:Healed - Epithelialized] [N/A:N/A] Measurements L x W x D 0x0x0 [N/A:N/A] (cm) Area (cm) : [4:0] [N/A:N/A] Volume (cm) : [4:0] [N/A:N/A] % Reduction in Area: [4:100.00%] [N/A:N/A] % Reduction in Volume: 100.00% [N/A:N/A] Classification: [4:Full Thickness Without Exposed Support Structures] [N/A:N/A] Exudate  Amount: [4:None Present] [N/A:N/A] Wound Margin: [4:Distinct, outline attached N/A] Granulation Amount: [4:None Present (0%)] [N/A:N/A] Necrotic Amount: [4:None Present (0%)] [N/A:N/A] Exposed Structures: [4:Fascia: No Fat Layer (Subcutaneous Tissue) Exposed: No Tendon: No Muscle: No Joint: No Bone: No Large (67-100%)] [N/A:N/A N/A] Treatment Notes Electronic Signature(s) Signed: 12/24/2019 5:31:36 PM By: Linton Ham MD Signed: 12/28/2019 5:28:43 PM By: Levan Hurst RN, BSN Entered By: Linton Ham on 12/24/2019 15:16:44 -------------------------------------------------------------------------------- Multi-Disciplinary Care Plan Details Patient Name: Date of Service: Kathryn [name] 12/24/2019 1:30 PM Medical Record [id number] Patient Account Number: [id number] Date of Birth/Sex: Treating RN: [birth year] (86 y.o. Kathryn [name] Primary Care Azlyn Wingler: Eliezer Lofts Other Clinician: Referring Nisa Decaire: Treating Glenn Gullickson/Extender:Robson, [name], [name] in Treatment: 16 Active Inactive Electronic Signature(s) Signed: 12/24/2019 5:31:53 PM By: Baruch Gouty RN, BSN Entered By: Baruch Gouty on 12/24/2019  14:42:22 -------------------------------------------------------------------------------- Pain Assessment Details Patient Name: Date of Service: Kathryn [name] 12/24/2019 1:30 PM Medical Record [id number] Patient Account Number: [id number] Date of Birth/Sex: Treating RN: [birth year] (86 y.o. Kathryn [name] Primary Care Anahita Cua: Eliezer Lofts Other Clinician: Referring Cartez Mogle: Treating Ashawna Hanback/Extender:Robson, [name], [name] in Treatment: 16 Active Problems Location of Pain Severity and Description of Pain Patient Has Paino No Site Locations Pain Management and Medication Current Pain Management: Electronic Signature(s) Signed: 12/24/2019 5:06:29 PM By: Kela Millin Entered By: Kela Millin on 12/24/2019 14:14:30 -------------------------------------------------------------------------------- Patient/Caregiver Education Details Patient Name: Date of Service: Kathryn [name] 3/29/2021andnbsp1:30 PM Medical Record [id number] Patient Account Number: [id number] Date of Birth/Gender: 1930-10-13 (86 y.o. F) Treating RN: Baruch Gouty Primary Care Physician: Eliezer Lofts Other Clinician: Referring Physician: Treating Physician/Extender:Robson, [name], [name] in Treatment: 16 Education Assessment Education Provided To: Patient Education Topics Provided Tissue Oxygenation: Methods: Explain/Verbal Responses: Reinforcements needed, State content correctly Wound/Skin Impairment: Methods: Explain/Verbal Responses: Reinforcements needed, State content correctly Electronic Signature(s) Signed: 12/24/2019 5:31:53 PM By: Baruch Gouty RN, BSN Entered By: Baruch Gouty on 12/24/2019 14:32:31 -------------------------------------------------------------------------------- Wound Assessment Details Patient Name: Date of Service: Kathryn [name] 12/24/2019 1:30 PM Medical Record [id number] Patient Account Number:  [id number] Date of Birth/Sex: Treating RN: 11/22/1930 (86 y.o. Kathryn [name] Primary Care Roderick Sweezy: Eliezer Lofts Other Clinician: Referring Jarvis Sawa: Treating Teja Judice/Extender:Robson, [name], [name] in Treatment: 16 Wound Status Wound Number: 4 Primary Trauma, Other Etiology: Wound Location: Left, Proximal, Medial Lower Leg Wound Status: Healed - Epithelialized Wounding Event: Trauma Comorbid Cataracts, Osteoarthritis, Confinement Date Acquired: 12/17/2019 History: Anxiety Weeks Of Treatment: 1 Clustered Wound: No Photos Photo Uploaded By: Mikeal Hawthorne on 12/28/2019 11:42:27 Wound Measurements Length: (cm) 0 % Reduct Width: (cm) 0 % Reduct Depth: (cm) 0 Epitheli Area: (cm) 0 Tunneli Volume: (cm) 0 Undermi Wound Description Full Thickness Without Exposed Support Foul Odo Classification: Structures Slough/F Wound Distinct, outline attached Margin: Exudate None Present Amount: Wound Bed Granulation Amount: None Present (0%) Necrotic Amount: None Present (0%) Fascia E Fat Laye Tendon E Muscle E Joint Ex Bone Exp r After Cleansing: No ibrino No Exposed Structure xposed: No r (Subcutaneous Tissue) Exposed: No xposed: No xposed: No posed: No osed: No ion in Area: 100% ion in Volume: 100% alization: Large (67-100%) ng: No ning: No Electronic Signature(s) Signed: 12/24/2019 5:31:53 PM By: Baruch Gouty RN, BSN Entered By: Baruch Gouty on 12/24/2019 14:44:03 -------------------------------------------------------------------------------- Vitals Details Patient Name: Date of Service: Kathryn [name] 12/24/2019 1:30 PM Medical Record [id number] Patient Account Number: [id number] Date of Birth/Sex: Treating RN: 08-25-1931 (86 y.o. Kathryn [name] Primary Care Dareion Kneece: Eliezer Lofts Other Clinician: Referring Alichia Alridge: Treating  Walter Grima/Extender:Robson, [name], [name] in Treatment: 16 Vital Signs Time Taken:  14:00 Temperature (F): 98 Height (in): 67 Pulse (bpm): 72 Weight (lbs): 125 Respiratory Rate (breaths/min): 18 Body Mass Index (BMI): 19.6 Blood Pressure (mmHg): 140/74 Reference Range: 80 - 120 mg / dl Electronic Signature(s) Signed: 12/24/2019 5:06:29 PM By: Kela Millin Entered By: Kela Millin on 12/24/2019 14:14:20

## 2020-01-09 NOTE — Progress Notes (Signed)
Kathryn, [name] ([id number]) Visit Report for 12/03/2019 Arrival Information Details Patient Name: Date of Service: Kathryn, [name] 12/03/2019 10:30 AM Medical Record [id number] Patient Account Number: [id number] Date of Birth/Sex: Treating RN: 07/14/1931 (86 y.o. [name] Primary Care Vi Biddinger: Eliezer Lofts Other Clinician: Referring Kaprice Kage: Treating Alyzza Andringa/Extender:Robson, [name], [name] in Treatment: 13 Visit Information History Since Last Visit Added or deleted any medications: No Patient Arrived: Ambulatory Any new allergies or adverse reactions: No Arrival Time: 11:19 Had a fall or experienced change in No Accompanied By: friend activities of daily living that may affect Transfer Assistance: None risk of falls: Patient Identification Verified: Yes Signs or symptoms of abuse/neglect since last No Secondary Verification Process Completed: Yes visito Patient Requires Transmission-Based No Hospitalized since last visit: No Precautions: Implantable device outside of the clinic excluding No Patient Has Alerts: No cellular tissue based products placed in the center since last visit: Has Dressing in Place as Prescribed: Yes Pain Present Now: No Electronic Signature(s) Signed: 01/09/2020 9:21:08 AM By: Sandre Kitty Entered By: Sandre Kitty on 12/03/2019 11:20:02 -------------------------------------------------------------------------------- Compression Therapy Details Patient Name: Date of Service: Kathryn [name] 12/03/2019 10:30 AM Medical Record [id number] Patient Account Number: [id number] Date of Birth/Sex: Treating RN: Nov 08, 1930 (86 y.o. [name] Primary Care Cassity Christian: Eliezer Lofts Other Clinician: Referring Josyah Achor: Treating Damonica Chopra/Extender:Robson, [name], [name] in Treatment: 13 Compression Therapy Performed for Wound Wound #1 Left,Medial Lower Leg Assessment: Performed By: Clinician Levan Hurst, RN Compression Type: Three Layer Post Procedure Diagnosis Same as Pre-procedure Electronic Signature(s) Signed: 12/03/2019 5:59:01 PM By: Levan Hurst RN, BSN Entered By: Levan Hurst on 12/03/2019 17:31:52 -------------------------------------------------------------------------------- Encounter Discharge Information Details Patient Name: Date of Service: Kathryn [name] 12/03/2019 10:30 AM Medical Record [id number] Patient Account Number: [id number] Date of Birth/Sex: Treating RN: 1931-04-03 (86 y.o. [name] Primary Care Selma Mink: Eliezer Lofts Other Clinician: Referring Landyn Buckalew: Treating Kaidence Sant/Extender:Robson, [name], [name] in Treatment: 13 Encounter Discharge Information Items Discharge Condition: Stable Ambulatory Status: Ambulatory Discharge Destination: Home Transportation: Private Auto Accompanied By: friend Schedule Follow-up Appointment: Yes Clinical Summary of Care: Electronic Signature(s) Signed: 12/03/2019 1:02:56 PM By: Deon Pilling Entered By: Deon Pilling on 12/03/2019 11:57:59 -------------------------------------------------------------------------------- Lower Extremity Assessment Details Patient Name: Date of Service: Kathryn, [name] 12/03/2019 10:30 AM Medical Record [id number] Patient Account Number: [id number] Date of Birth/Sex: Treating RN: 1931-04-13 (86 y.o. [name] Primary Care Emilea Goga: Eliezer Lofts Other Clinician: Referring Rubi Tooley: Treating Talmage Teaster/Extender:Robson, [name], [name] in Treatment: 13 Edema Assessment Assessed: [Left: No] [Right: No] Edema: [Left: N] [Right: o] Calf Left: Right: Point of Measurement: cm From Medial Instep 29 cm cm Ankle Left: Right: Point of Measurement: cm From Medial Instep 19.5 cm cm Vascular Assessment Pulses: Dorsalis Pedis Palpable: [Left:Yes] Electronic Signature(s) Signed: 12/03/2019 5:59:01 PM By: Levan Hurst RN, BSN Signed:  01/09/2020 9:21:08 AM By: Sandre Kitty Entered By: Sandre Kitty on 12/03/2019 11:33:21 -------------------------------------------------------------------------------- Multi Wound Chart Details Patient Name: Date of Service: Kathryn [name] 12/03/2019 10:30 AM Medical Record [id number] Patient Account Number: [id number] Date of Birth/Sex: Treating RN: 10-10-1930 (86 y.o. [name] Primary Care Shani Fitch: Eliezer Lofts Other Clinician: Referring Deisi Salonga: Treating Yarelin Reichardt/Extender:Robson, [name], [name] in Treatment: 13 Vital Signs Height(in): 67 Pulse(bpm): 76 Weight(lbs): 125 Blood Pressure(mmHg): 143/79 Body Mass Index(BMI): 20 Temperature(F): 98.0 Respiratory 18 Rate(breaths/min): Photos: [1:No Photos] [2:No Photos] [N/A:N/A] Wound Location: [1:Left Lower Leg - Medial] [2:Left, Anterior Lower Leg] [N/A:N/A] Wounding Event: [1:Trauma] [2:Trauma] [N/A:N/A] Primary Etiology: [1:Venous Leg Ulcer] [  2:Abrasion] [N/A:N/A] Comorbid History: [1:Cataracts, Osteoarthritis, Confinement Anxiety] [2:N/A] [N/A:N/A] Date Acquired: [1:05/29/2019] [2:11/13/2019] [N/A:N/A] Weeks of Treatment: [1:13] [2:2] [N/A:N/A] Wound Status: [1:Open] [2:Healed - Epithelialized] [N/A:N/A] Measurements L x W x D 0x0x0.1 [2:0x0x0] [N/A:N/A] (cm) Area (cm) : [1:0.008] [2:0] [N/A:N/A] Volume (cm) : [1:0.001] [2:0] [N/A:N/A] % Reduction in Area: [1:99.90%] [2:100.00%] [N/A:N/A] % Reduction in Volume: 100.00% [2:100.00%] [N/A:N/A] Classification: [1:Full Thickness Without Exposed Support StructuresExposed Support Structures] [2:Full Thickness Without] [N/A:N/A] Exudate Amount: [1:None Present] [2:N/A] [N/A:N/A] Wound Margin: [1:Distinct, outline attached N/A] [N/A:N/A] Granulation Amount: [1:None Present (0%)] [2:N/A] [N/A:N/A] Necrotic Amount: [1:None Present (0%)] [2:N/A] [N/A:N/A] Exposed Structures: [1:Fascia: No Fat Layer (Subcutaneous Tissue) Exposed: No Tendon: No  Muscle: No Joint: No Bone: No Large (67-100%)] [2:N/A N/A] [N/A:N/A N/A] Treatment Notes Wound #1 (Left, Medial Lower Leg) 1. Cleanse With Wound Cleanser Soap and water 2. Periwound Care Moisturizing lotion TCA Cream 3. Primary Dressing Applied Calcium Alginate 4. Secondary Dressing Dry Gauze 6. Support Layer Applied 3 layer compression wrap Notes netting. Electronic Signature(s) Signed: 12/03/2019 5:29:15 PM By: Linton Ham MD Signed: 12/03/2019 5:59:01 PM By: Levan Hurst RN, BSN Entered By: Linton Ham on 12/03/2019 12:40:59 -------------------------------------------------------------------------------- Multi-Disciplinary Care Plan Details Patient Name: Date of Service: Kathryn [name] 12/03/2019 10:30 AM Medical Record [id number] Patient Account Number: [id number] Date of Birth/Sex: Treating RN: 04/09/1931 (86 y.o. [name] Primary Care Keyoni Lapinski: Eliezer Lofts Other Clinician: Referring Ketty Bitton: Treating Adryanna Friedt/Extender:Robson, [name], [name] in Treatment: 13 Active Inactive Wound/Skin Impairment Nursing Diagnoses: Knowledge deficit related to ulceration/compromised skin integrity Goals: Patient/caregiver will verbalize understanding of skin care regimen Date Initiated: 08/28/2019 Target Resolution Date: 12/21/2019 Goal Status: Active Ulcer/skin breakdown will have a volume reduction of 30% by week 4 Date Initiated: 08/28/2019 Date Inactivated: 10/01/2019 Target Resolution Date: 10/05/2019 Goal Status: Met Ulcer/skin breakdown will have a volume reduction of 50% by week 8 Date Initiated: 10/01/2019 Date Inactivated: 11/19/2019 Target Resolution Date: 11/02/2019 Goal Status: Met Interventions: Assess patient/caregiver ability to obtain necessary supplies Assess patient/caregiver ability to perform ulcer/skin care regimen upon admission and as needed Assess ulceration(s) every visit Notes: Electronic Signature(s) Signed: 12/03/2019 5:59:01  PM By: Levan Hurst RN, BSN Entered By: Levan Hurst on 12/03/2019 16:46:50 -------------------------------------------------------------------------------- Pain Assessment Details Patient Name: Date of Service: Kathryn [name] 12/03/2019 10:30 AM Medical Record [id number] Patient Account Number: [id number] Date of Birth/Sex: Treating RN: 01/20/1931 (86 y.o. [name] Primary Care Malyia Moro: Eliezer Lofts Other Clinician: Referring Emarie Paul: Treating Olayinka Gathers/Extender:Robson, [name], [name] in Treatment: 13 Active Problems Location of Pain Severity and Description of Pain Patient Has Paino No Site Locations Pain Management and Medication Current Pain Management: Electronic Signature(s) Signed: 12/03/2019 5:59:01 PM By: Levan Hurst RN, BSN Signed: 01/09/2020 9:21:08 AM By: Sandre Kitty Entered By: Sandre Kitty on 12/03/2019 11:20:42 -------------------------------------------------------------------------------- Patient/Caregiver Education Details Patient Name: Date of Service: Kathryn [name] 3/8/2021andnbsp10:30 AM Medical Record [id number] Patient Account Number: [id number] Date of Birth/Gender: 1931/07/28 (86 y.o. F) Treating RN: Levan Hurst Primary Care Physician: Eliezer Lofts Other Clinician: Referring Physician: Treating Physician/Extender:Robson, [name], [name] in Treatment: 13 Education Assessment Education Provided To: Patient Education Topics Provided Wound/Skin Impairment: Methods: Explain/Verbal Responses: State content correctly Electronic Signature(s) Signed: 12/03/2019 5:59:01 PM By: Levan Hurst RN, BSN Entered By: Levan Hurst on 12/03/2019 16:48:05 -------------------------------------------------------------------------------- Wound Assessment Details Patient Name: Date of Service: Kathryn [name] 12/03/2019 10:30 AM Medical Record [id number] Patient Account Number: [id number] Date of  Birth/Sex: Treating RN: 29-Jul-1931 (86 y.o. [name] Primary Care Hilberto Burzynski: Diona Browner,  Amy Other Clinician: Referring Butler Vegh: Treating Chelcea Zahn/Extender:Robson, [name], [name] in Treatment: 13 Wound Status Wound Number: 1 Primary Venous Leg Ulcer Etiology: Etiology: Wound Location: Left Lower Leg - Medial Wound Status: Open Wounding Event: Trauma Comorbid Cataracts, Osteoarthritis, Confinement Date Acquired: 05/29/2019 History: Anxiety Weeks Of Treatment: 13 Clustered Wound: No Photos Wound Measurements Length: (cm) 0 % Reduct Width: (cm) 0 % Reduct Depth: (cm) 0.1 Epitheli Area: (cm) 0.008 Tunneli Volume: (cm) 0.001 Undermi Wound Description Full Thickness Without Exposed Support Foul Odo Classification: Structures Slough/F Wound Distinct, outline attached Margin: Exudate None Present Amount: Wound Bed Granulation Amount: None Present (0%) Necrotic Amount: None Present (0%) Fascia E Fat Laye Tendon E Muscle E Joint Ex Bone Exp r After Cleansing: No ibrino No Exposed Structure xposed: No r (Subcutaneous Tissue) Exposed: No xposed: No xposed: No posed: No osed: No ion in Area: 99.9% ion in Volume: 100% alization: Large (67-100%) ng: No ning: No Electronic Signature(s) Signed: 12/04/2019 3:29:05 PM By: Mikeal Hawthorne EMT/HBOT Signed: 01/09/2020 9:04:28 AM By: Levan Hurst RN, BSN Previous Signature: 12/03/2019 5:31:36 PM Version By: Kela Millin Entered By: Mikeal Hawthorne on 12/04/2019 14:39:39 -------------------------------------------------------------------------------- Wound Assessment Details Patient Name: Date of Service: Kathryn [name] 12/03/2019 10:30 AM Medical Record [id number] Patient Account Number: [id number] Date of Birth/Sex: Treating RN: Sep 25, 1931 (86 y.o. [name] Primary Care Marquest Gunkel: Eliezer Lofts Other Clinician: Referring Wiliam Cauthorn: Treating Argyle Gustafson/Extender:Robson, [name],  [name] in Treatment: 13 Wound Status Wound Number: 2 Primary Abrasion Etiology: Wound Location: Left Lower Leg - Anterior Wound Status: Healed - Epithelialized Wounding Event: Trauma Comorbid Cataracts, Osteoarthritis, Confinement Date Acquired: 11/13/2019 History: Anxiety Weeks Of Treatment: 2 Clustered Wound: No Photos Wound Measurements Length: (cm) 0 % Reduct Width: (cm) 0 % Reduct Depth: (cm) 0 Epitheli Area: (cm) 0 Volume: (cm) 0 Wound Description Classification: Full Thickness Without Exposed Support Foul Od Structures Slough/ Wound Distinct, outline attached Margin: Exudate Small Amount: Exudate Serosanguineous Type: Exudate red, brown Color: Wound Bed Granulation Amount: Large (67-100%) Granulation Quality: Red, Pink Fascia E Necrotic Amount: None Present (0%) Fat Laye Tendon E Muscle E Joint Ex Bone Exp or After Cleansing: No Fibrino No Exposed Structure xposed: No r (Subcutaneous Tissue) Exposed: Yes xposed: No xposed: No posed: No osed: No ion in Area: 100% ion in Volume: 100% alization: None Electronic Signature(s) Signed: 12/04/2019 3:29:05 PM By: Mikeal Hawthorne EMT/HBOT Signed: 12/04/2019 5:46:17 PM By: Kela Millin Previous Signature: 12/03/2019 5:31:36 PM Version By: Kela Millin Entered By: Mikeal Hawthorne on 12/04/2019 14:40:05 -------------------------------------------------------------------------------- Vitals Details Patient Name: Date of Service: Kathryn Laine B. 12/03/2019 10:30 AM Medical Record [id number] Patient Account Number: [id number] Date of Birth/Sex: Treating RN: [birth year] (86 y.o. [name] Primary Care Novah Goza: Eliezer Lofts Other Clinician: Referring Madden Garron: Treating Dion Parrow/Extender:Robson, [name], [name] in Treatment: 13 Vital Signs Time Taken: 11:20 Temperature (F): 98.0 Height (in): 67 Pulse (bpm): 76 Weight (lbs): 125 Respiratory Rate (breaths/min): 18 Body Mass  Index (BMI): 19.6 Blood Pressure (mmHg): 143/79 Reference Range: 80 - 120 mg / dl Electronic Signature(s) Signed: 01/09/2020 9:21:08 AM By: Sandre Kitty Entered By: Sandre Kitty on 12/03/2019 11:20:35

## 2020-01-09 NOTE — Progress Notes (Signed)
VIRGLE, GAUDET ([id number]) Visit Report for 11/05/2019 Arrival Information Details Patient Name: Date of Service: Kathryn [name], Kathryn [name] 11/05/2019 10:00 AM Medical Record [id number] Patient Account Number: [id number] Date of Birth/Sex: Treating RN: 1931/04/15 (86 y.o. F) Primary Care Mardelle Pandolfi: Eliezer Lofts Other Clinician: Referring Aviya Jarvie: Treating Kali Deadwyler/Extender:Robson, [name], [name] in Treatment: 9 Visit Information History Since Last Visit Added or deleted any medications: No Patient Arrived: Ambulatory Any new allergies or adverse reactions: No Arrival Time: 10:09 Had a fall or experienced change in No Accompanied By: self activities of daily living that may affect Transfer Assistance: None risk of falls: Patient Identification Verified: Yes Signs or symptoms of abuse/neglect since last No Secondary Verification Process Completed: Yes visito Patient Requires Transmission-Based No Hospitalized since last visit: No Precautions: Implantable device outside of the clinic excluding No Patient Has Alerts: No cellular tissue based products placed in the center since last visit: Has Dressing in Place as Prescribed: Yes Pain Present Now: No Electronic Signature(s) Signed: 01/09/2020 9:23:26 AM By: Sandre Kitty Entered By: Sandre Kitty on 11/05/2019 10:12:36 -------------------------------------------------------------------------------- Compression Therapy Details Patient Name: Date of Service: Kathryn [name] 11/05/2019 10:00 AM Medical Record [id number] Patient Account Number: [id number] Date of Birth/Sex: Treating RN: [birth year] (86 y.o. [name] Primary Care Jaylen Claude: Eliezer Lofts Other Clinician: Referring Earle Burson: Treating Derold Dorsch/Extender:Robson, [name], [name] in Treatment: 9 Compression Therapy Performed for Wound Wound #1 Left,Medial Lower Leg Assessment: Performed By: Clinician Levan Hurst, RN Compression  Type: Three Layer Post Procedure Diagnosis Same as Pre-procedure Electronic Signature(s) Signed: 11/06/2019 5:30:42 PM By: Levan Hurst RN, BSN Entered By: Levan Hurst on 11/05/2019 11:00:21 -------------------------------------------------------------------------------- Encounter Discharge Information Details Patient Name: Date of Service: Kathryn [name] 11/05/2019 10:00 AM Medical Record [id number] Patient Account Number: [id number] Date of Birth/Sex: Treating RN: January 21, 1931 (86 y.o. [name] Primary Care Caid Radin: Eliezer Lofts Other Clinician: Referring Elfie Costanza: Treating [name]/Extender:Robson, [name], [name] in Treatment: 9 Encounter Discharge Information Items Post Procedure Vitals Discharge Condition: Stable Temperature (F): 98 Ambulatory Status: Ambulatory Pulse (bpm): 80 Discharge Destination: Home Respiratory Rate (breaths/min): 20 Transportation: Private Auto Blood Pressure (mmHg): 163/87 Accompanied By: self Schedule Follow-up Appointment: Yes Clinical Summary of Care: Patient Declined Electronic Signature(s) Signed: 11/05/2019 5:38:07 PM By: Kela Millin Entered By: Kela Millin on 11/05/2019 11:07:30 -------------------------------------------------------------------------------- Lower Extremity Assessment Details Patient Name: Date of Service: Kathryn [name], Kathryn [name] 11/05/2019 10:00 AM Medical Record [id number] Patient Account Number: [id number] Date of Birth/Sex: Treating RN: 09/22/1931 (86 y.o. Orvan Falconer Primary Care Diamonte Stavely: Eliezer Lofts Other Clinician: Referring Flois Mctague: Treating Rayelynn Loyal/Extender:Robson, [name], [name] in Treatment: 9 Edema Assessment Assessed: [Left: No] [Right: No] Edema: [Left: N] [Right: o] Calf Left: Right: Point of Measurement: cm From Medial Instep 29 cm cm Ankle Left: Right: Point of Measurement: cm From Medial Instep 21 cm cm Electronic Signature(s) Signed:  11/05/2019 5:29:28 PM By: Carlene Coria RN Entered By: Carlene Coria on 11/05/2019 10:27:03 -------------------------------------------------------------------------------- Multi Wound Chart Details Patient Name: Date of Service: Kathryn [name] 11/05/2019 10:00 AM Medical Record [id number] Patient Account Number: [id number] Date of Birth/Sex: Treating RN: 02/09/1931 (86 y.o. F) Primary Care Lisle Skillman: Eliezer Lofts Other Clinician: Referring Eldred Sooy: Treating Ege Muckey/Extender:Robson, [name], [name] in Treatment: 9 Vital Signs Height(in): 67 Pulse(bpm): 80 Weight(lbs): 125 Blood Pressure(mmHg): 163/87 Body Mass Index(BMI): 20 Temperature(F): 98.0 Respiratory 20 Rate(breaths/min): Photos: [1:No Photos] [N/A:N/A] Wound Location: [1:Left Lower Leg - Medial] [N/A:N/A] Wounding Event: [1:Trauma] [N/A:N/A] Primary Etiology: [1:Venous Leg Ulcer] [N/A:N/A] Comorbid History: [1:Cataracts, Osteoarthritis, N/A Confinement  Anxiety] Date Acquired: [1:05/29/2019] [N/A:N/A] Weeks of Treatment: [1:9] [N/A:N/A] Wound Status: [1:Open] [N/A:N/A] Measurements L x W x D 5x1.8x0.1 [N/A:N/A] (cm) Area (cm) : [1:7.069] [N/A:N/A] Volume (cm) : [id number] [N/A:N/A] % Reduction in Area: [1:29.20%] [N/A:N/A] % Reduction in Volume: 76.40% [N/A:N/A] Classification: [1:Full Thickness Without Exposed Support Structures] [N/A:N/A] Exudate Amount: [1:Medium] [N/A:N/A] Exudate Type: [1:Serosanguineous] [N/A:N/A] Exudate Color: [1:red, brown] [N/A:N/A] Wound Margin: [1:Flat and Intact] [N/A:N/A] Granulation Amount: [1:Large (67-100%)] [N/A:N/A] Granulation Quality: [1:Pink, Pale, Hyper- granulation] [N/A:N/A] Necrotic Amount: [1:None Present (0%)] [N/A:N/A] Exposed Structures: [1:Fat Layer (Subcutaneous Tissue) Exposed: Yes Fascia: No Tendon: No Muscle: No Joint: No Bone: No] [N/A:N/A] Epithelialization: [1:Medium (34-66%)] [N/A:N/A] Debridement: [1:Debridement - Excisional]  [N/A:N/A] Pre-procedure [1:10:54] [N/A:N/A] Verification/Time Out Taken: Pain Control: [1:Other] [N/A:N/A] Tissue Debrided: [1:Subcutaneous] [N/A:N/A] Level: [1:Skin/Subcutaneous Tissue] [N/A:N/A] Debridement Area (sq cm):9 [N/A:N/A] Instrument: [1:Blade] [N/A:N/A] Bleeding: [1:Moderate] [N/A:N/A] Hemostasis Achieved: [1:Silver Nitrate] [N/A:N/A] Procedural Pain: [1:3] [N/A:N/A] Post Procedural Pain: [1:0] [N/A:N/A] Debridement Treatment Procedure was tolerated [N/A:N/A] Response: [1:well] Post Debridement [1:5x1.8x0.1] [N/A:N/A] Measurements L x W x D (cm) Post Debridement [id number] [N/A:N/A] Volume: (cm) Procedures Performed: Cellular or Tissue Based [1:Product Compression Therapy Debridement] [N/A:N/A] Treatment Notes Electronic Signature(s) Signed: 11/05/2019 6:01:37 PM By: Linton Ham MD Entered By: Linton Ham on 11/05/2019 11:05:35 -------------------------------------------------------------------------------- Pain Assessment Details Patient Name: Date of Service: Kathryn [name] 11/05/2019 10:00 AM Medical Record [id number] Patient Account Number: [id number] Date of Birth/Sex: Treating RN: [birth year] (86 y.o. F) Primary Care Kaeley Vinje: Eliezer Lofts Other Clinician: Referring Tadao Emig: Treating Kadajah Kjos/Extender:Robson, [name], [name] in Treatment: 9 Active Problems Location of Pain Severity and Description of Pain Patient Has Paino No Patient Has Paino No Site Locations Pain Management and Medication Current Pain Management: Electronic Signature(s) Signed: 01/09/2020 9:23:26 AM By: Sandre Kitty Entered By: Sandre Kitty on 11/05/2019 10:13:04 -------------------------------------------------------------------------------- Patient/Caregiver Education Details Patient Name: Date of Service: Kathryn [name], Kathryn B. 2/8/2021andnbsp10:00 AM Medical Record [id number] Patient Account Number: [id number] Date of Birth/Gender: [birth year] (86  y.o. F) Treating RN: Levan Hurst Primary Care Physician: Eliezer Lofts Other Clinician: Referring Physician: Treating Physician/Extender:Robson, [name], [name] in Treatment: 9 Education Assessment Education Provided To: Patient Education Topics Provided Wound/Skin Impairment: Methods: Explain/Verbal Responses: State content correctly Electronic Signature(s) Signed: 11/06/2019 5:30:42 PM By: Levan Hurst RN, BSN Entered By: Levan Hurst on 11/05/2019 10:45:59 -------------------------------------------------------------------------------- Wound Assessment Details Patient Name: Date of Service: Kathryn [name] 11/05/2019 10:00 AM Medical Record [id number] Patient Account Number: [id number] Date of Birth/Sex: Treating RN: [birth year] (86 y.o. F) Primary Care Dalan Cowger: Eliezer Lofts Other Clinician: Referring Hisao Doo: Treating Taijah Macrae/Extender:Robson, [name], [name] in Treatment: 9 Wound Status Wound Number: 1 Primary Venous Leg Ulcer Etiology: Wound Location: Left Lower Leg - Medial Wound Status: Open Wounding Event: Trauma Comorbid Cataracts, Osteoarthritis, Confinement Date Acquired: 05/29/2019 History: Anxiety Weeks Of Treatment: 9 Clustered Wound: No Photos Wound Measurements Length: (cm) 5 % Reduct Width: (cm) 1.8 % Reduct Depth: (cm) 0.1 Epitheli Area: (cm) 7.069 Tunneli Volume: (cm) 0.707 Undermi Wound Description Classification: Full Thickness Without Exposed Support Foul Od Structures Slough/ Wound Flat and Intact Margin: Exudate Medium Amount: Exudate Serosanguineous Type: Exudate red, brown Color: Wound Bed Granulation Amount: Large (67-100%) Granulation Quality: Pink, Pale, Hyper-granulation Fascia E Necrotic Amount: None Present (0%) Fat Laye Tendon E Muscle E Joint Ex Bone Exp Electronic Signature(s) Signed: 11/06/2019 4:28:10 PM By: Mikeal Hawthorne EMT/HBOT Entered By: Mikeal Hawthorne on 02/09/ or After  Cleansing: No Fibrino No Exposed Structure xposed: No r (Subcutaneous Tissue) Exposed: Yes xposed: No xposed: No posed:  No osed: No 2021 11:21:40 ion in Area: 29.2% ion in Volume: 76.4% alization: Medium (34-66%) ng: No ning: No -------------------------------------------------------------------------------- Vitals Details Patient Name: Date of ServiceJELLISA, Kathryn [name] 11/05/2019 10:00 AM Medical Record [id number] Patient Account Number: [id number] Date of Birth/Sex: Treating RN: 10-Jul-1931 (86 y.o. F) Primary Care [address]: Eliezer Lofts Other Clinician: Referring Trica Usery: Treating Angelena Sand/Extender:Robson, [name], [name] in Treatment: 9 Vital Signs Time Taken: 10:12 Temperature (F): 98.0 Height (in): 67 Pulse (bpm): 80 Weight (lbs): 125 Respiratory Rate (breaths/min): 20 Body Mass Index (BMI): 19.6 Blood Pressure (mmHg): 163/87 Reference Range: 80 - 120 mg / dl Electronic Signature(s) Signed: 01/09/2020 9:23:26 AM By: Sandre Kitty Entered By: Sandre Kitty on 11/05/2019 10:12:55

## 2020-01-15 ENCOUNTER — Other Ambulatory Visit: Payer: Self-pay | Admitting: Family Medicine

## 2020-01-15 NOTE — Telephone Encounter (Signed)
Left message for Mrs. Harr to return my call.

## 2020-01-15 NOTE — Telephone Encounter (Signed)
This is not a great long term med for someone 53. Use diclofenac topical OTC instead. Tylenol 500 mg  1-2 three times a day for joint pain.

## 2020-01-15 NOTE — Telephone Encounter (Signed)
Last office visit 10/23/2019 for CPE.  Not on current medication list.  Spoke with patient and she states she is still taking it for her arthritis.  She states she has tried to come off of it but she is still take one tablet daily.  Ok to refill?

## 2020-01-16 NOTE — Telephone Encounter (Signed)
Kathryn [name] notified as instructed by telephone.  Patient states understanding.  Refill request denied.

## 2020-01-22 ENCOUNTER — Other Ambulatory Visit: Payer: Self-pay | Admitting: Family Medicine

## 2020-02-19 DIAGNOSIS — Z0279 Encounter for issue of other medical certificate: Secondary | ICD-10-CM

## 2020-03-04 ENCOUNTER — Ambulatory Visit (INDEPENDENT_AMBULATORY_CARE_PROVIDER_SITE_OTHER): Payer: Medicare Other | Admitting: Family Medicine

## 2020-03-04 ENCOUNTER — Other Ambulatory Visit: Payer: Self-pay

## 2020-03-04 ENCOUNTER — Encounter: Payer: Self-pay | Admitting: Family Medicine

## 2020-03-04 VITALS — BP 130/90 | HR 77 | Temp 97.8°F | Ht 65.0 in | Wt 122.2 lb

## 2020-03-04 DIAGNOSIS — R431 Parosmia: Secondary | ICD-10-CM | POA: Insufficient documentation

## 2020-03-04 DIAGNOSIS — J3089 Other allergic rhinitis: Secondary | ICD-10-CM

## 2020-03-04 NOTE — Patient Instructions (Signed)
The ENT office will call to set up an appointment for you.  Start flonase once daily... 2 sprays per nostril.  Also use nasal saline 2-3 times daily ( do not wash out flonase with the saline.. use saline instead before AM flonase)

## 2020-03-04 NOTE — Assessment & Plan Note (Signed)
?   Due to allergies Treat with nasal saline and nasal steroid.  No clear S/S of CVA. Refer to ENT.. may need CT sinuses to eval for chronic infection/polyps.

## 2020-03-04 NOTE — Progress Notes (Signed)
Chief Complaint  Patient presents with  . nose issue    pt states that her nose smells bad     History of Present Illness: HPI  86 year old female presents with complaints of unusual smell in nose.  She reports over the last 10 or more years she has noted  a rotten food smell.  This has gradually worsened in the last few months... she now smells it constantly.  She has rhinitis, clear mucus,  No post nasal drip. No sinus pain or pressure. No fever.  Some sneezing.  She uses zyrtec for allergies... does not help much.  Has never had this issue evaluated by ENT before.  Many years ago had deviated septum on left.. surgically repaired.   No new meds.  No loss of smell or taste.    This visit occurred during the SARS-CoV-2 public health emergency.  Safety protocols were in place, including screening questions prior to the visit, additional usage of staff PPE, and extensive cleaning of exam room while observing appropriate contact time as indicated for disinfecting solutions.   COVID 19 screen:  No recent travel or known exposure to COVID19 The patient denies respiratory symptoms of COVID 19 at this time. The importance of social distancing was discussed today.     Review of Systems  Constitutional: Negative for chills and fever.  HENT: Negative for congestion and ear pain.   Eyes: Negative for pain and redness.  Respiratory: Negative for cough and shortness of breath.   Cardiovascular: Negative for chest pain, palpitations and leg swelling.  Gastrointestinal: Negative for abdominal pain, blood in stool, constipation, diarrhea, nausea and vomiting.  Genitourinary: Negative for dysuria.  Musculoskeletal: Negative for falls and myalgias.  Skin: Negative for rash.  Neurological: Negative for dizziness.  Psychiatric/Behavioral: Negative for depression. The patient is not nervous/anxious.       Past Medical History:  Diagnosis Date  . Allergy   . Arthritis   . Colon polyps    . History of blood transfusion    with previous surgeries  . Hyperlipidemia   . Irregular heart rate   . PONV (postoperative nausea and vomiting)    hx of pt. states she thinks was from pain med.    reports that she has never smoked. She has never used smokeless tobacco. She reports current alcohol use of about 3.0 standard drinks of alcohol per week. She reports that she does not use drugs.   Current Outpatient Medications:  .  Calcium Carbonate-Vit D-Min (CALCIUM 1200 PO), Take 2 tablets by mouth daily., Disp: , Rfl:  .  cetirizine (ZYRTEC) 10 MG tablet, TAKE 1 TABLET DAILY, Disp: 90 tablet, Rfl: 3 .  Cyanocobalamin (VITAMIN B-12 PO), Take 1 tablet by mouth daily., Disp: , Rfl:    Observations/Objective: Blood pressure 130/90, pulse 77, temperature 97.8 F (36.6 C), temperature source Temporal, height 5\' 5"  (1.651 m), weight 122 lb 4 oz (55.5 kg), SpO2 97 %.  Physical Exam Constitutional:      General: She is not in acute distress.    Appearance: Normal appearance. She is well-developed. She is not ill-appearing or toxic-appearing.     Comments: elderly  HENT:     Head: Normocephalic.     Right Ear: Hearing, tympanic membrane, ear canal and external ear normal. Tympanic membrane is not erythematous, retracted or bulging.     Left Ear: Hearing, tympanic membrane, ear canal and external ear normal. Tympanic membrane is not erythematous, retracted or bulging.  Nose: No mucosal edema or rhinorrhea.     Right Sinus: No maxillary sinus tenderness or frontal sinus tenderness.     Left Sinus: No maxillary sinus tenderness or frontal sinus tenderness.     Mouth/Throat:     Pharynx: Uvula midline.  Eyes:     General: Lids are normal. Lids are everted, no foreign bodies appreciated.     Conjunctiva/sclera: Conjunctivae normal.     Pupils: Pupils are equal, round, and reactive to light.  Neck:     Thyroid: No thyroid mass or thyromegaly.     Vascular: No carotid bruit.     Trachea:  Trachea normal.  Cardiovascular:     Rate and Rhythm: Normal rate and regular rhythm.     Pulses: Normal pulses.     Heart sounds: Normal heart sounds, S1 normal and S2 normal. No murmur. No friction rub. No gallop.   Pulmonary:     Effort: Pulmonary effort is normal. No tachypnea or respiratory distress.     Breath sounds: Normal breath sounds. No decreased breath sounds, wheezing, rhonchi or rales.  Abdominal:     General: Bowel sounds are normal.     Palpations: Abdomen is soft.     Tenderness: There is no abdominal tenderness.  Musculoskeletal:     Cervical back: Normal range of motion and neck supple.  Skin:    General: Skin is warm and dry.     Findings: No rash.  Neurological:     Mental Status: She is alert.  Psychiatric:        Mood and Affect: Mood is not anxious or depressed.        Speech: Speech normal.        Behavior: Behavior normal. Behavior is cooperative.        Thought Content: Thought content normal.        Judgment: Judgment normal.      Assessment and Plan   Unusual smell in nose ? Due to allergies Treat with nasal saline and nasal steroid.  No clear S/S of CVA. Refer to ENT.. may need CT sinuses to eval for chronic infection/polyps.    Eliezer Lofts, MD

## 2020-07-10 DIAGNOSIS — M25562 Pain in left knee: Secondary | ICD-10-CM | POA: Diagnosis not present

## 2020-07-10 DIAGNOSIS — M1712 Unilateral primary osteoarthritis, left knee: Secondary | ICD-10-CM | POA: Diagnosis not present

## 2020-08-04 DIAGNOSIS — M1712 Unilateral primary osteoarthritis, left knee: Secondary | ICD-10-CM | POA: Insufficient documentation

## 2020-08-04 DIAGNOSIS — M179 Osteoarthritis of knee, unspecified: Secondary | ICD-10-CM | POA: Insufficient documentation

## 2020-08-04 DIAGNOSIS — M1710 Unilateral primary osteoarthritis, unspecified knee: Secondary | ICD-10-CM | POA: Insufficient documentation

## 2020-09-03 ENCOUNTER — Ambulatory Visit: Payer: Medicare Other

## 2020-09-04 ENCOUNTER — Other Ambulatory Visit: Payer: Self-pay

## 2020-09-04 ENCOUNTER — Ambulatory Visit (INDEPENDENT_AMBULATORY_CARE_PROVIDER_SITE_OTHER): Payer: Medicare Other

## 2020-09-04 DIAGNOSIS — Z23 Encounter for immunization: Secondary | ICD-10-CM

## 2020-10-02 ENCOUNTER — Ambulatory Visit (INDEPENDENT_AMBULATORY_CARE_PROVIDER_SITE_OTHER): Payer: Medicare Other | Admitting: Family Medicine

## 2020-10-02 ENCOUNTER — Other Ambulatory Visit: Payer: Self-pay

## 2020-10-02 ENCOUNTER — Encounter: Payer: Self-pay | Admitting: Family Medicine

## 2020-10-02 ENCOUNTER — Telehealth: Payer: Self-pay | Admitting: Cardiology

## 2020-10-02 VITALS — BP 118/80 | HR 94 | Temp 97.6°F | Ht 65.0 in | Wt 127.5 lb

## 2020-10-02 DIAGNOSIS — I4819 Other persistent atrial fibrillation: Secondary | ICD-10-CM

## 2020-10-02 DIAGNOSIS — Z01818 Encounter for other preprocedural examination: Secondary | ICD-10-CM | POA: Diagnosis not present

## 2020-10-02 NOTE — Progress Notes (Incomplete)
Patient ID: Kathryn [name], female    DOB: 11/21/1930, 87 y.o.   MRN: [id number]  This visit was conducted in person.  BP 118/80   Pulse 94   Temp 97.6 F (36.4 C) (Temporal)   Ht 5\' 5"  (1.651 m)   Wt 127 lb 8 oz (57.8 kg)   SpO2 95%   BMI 21.22 kg/m    CC: preop exam Subjective:   HPI: Kathryn [name] is a 87 y.o. female presenting on 10/02/2020 for Pre-op Exam  She reports she is planning left total knee arthroplasty by Dr. 11/30/2020 on 11/24/20.   She is feeling well, no Chest pain, no SOB, no fatigue.  walking daily 25 mile  Past surgical complications: none Past cardiac history :  Stress test 2017  intermediate risk study 2019 ECHO EF 50-55%, stage 1 diastolic dysfunction ascending aortic aneurysm present but stable Type of surgery: ORTHO intermediate risk  Mets of activity able to perform:  4-10 mets   EKG in office today is worrisome for aflutter or afib, rate  Controlled at 73, new in comparison to 2017   Body mass index is 21.22 kg/m.  Needs cbc, CMET, PT/INR, A1C and urinalysis  Relevant past medical, surgical, family and social history reviewed and updated as indicated. Interim medical history since our last visit reviewed. Allergies and medications reviewed and updated. Outpatient Medications Prior to Visit  Medication Sig Dispense Refill  . Calcium Carbonate-Vit D-Min (CALCIUM 1200 PO) Take 2 tablets by mouth daily.    . cetirizine (ZYRTEC) 10 MG tablet TAKE 1 TABLET DAILY 90 tablet 3  . Cyanocobalamin (VITAMIN B-12 PO) Take 1 tablet by mouth daily.     No facility-administered medications prior to visit.     Per HPI unless specifically indicated in ROS section below Review of Systems Objective:  BP 118/80   Pulse 94   Temp 97.6 F (36.4 C) (Temporal)   Ht 5\' 5"  (1.651 m)   Wt 127 lb 8 oz (57.8 kg)   SpO2 95%   BMI 21.22 kg/m   Wt Readings from Last 3 Encounters:  10/02/20 127 lb 8 oz (57.8 kg)  03/04/20 122 lb 4 oz (55.5 kg)  10/23/19 119 lb 8 oz  (54.2 kg)      Physical Exam    Results for orders placed or performed in visit on 10/16/19  IBC + Ferritin  Result Value Ref Range   Iron 108 42 - 145 ug/dL   Transferrin 10/25/19 10/18/19 - 360.0 mg/dL   Saturation Ratios 629.4 20.0 - 50.0 %   Ferritin 25.8 10.0 - 291.0 ng/mL  Comprehensive metabolic panel  Result Value Ref Range   Sodium 139 135 - 145 mEq/L   Potassium 4.7 3.5 - 5.1 mEq/L   Chloride 106 96 - 112 mEq/L   CO2 27 19 - 32 mEq/L   Glucose, Bld 82 70 - 99 mg/dL   BUN 28 (H) 6 - 23 mg/dL   Creatinine, Ser 765.4 0.40 - 1.20 mg/dL   Total Bilirubin 0.8 0.2 - 1.2 mg/dL   Alkaline Phosphatase 66 39 - 117 U/L   AST 26 0 - 37 U/L   ALT 20 0 - 35 U/L   Total Protein 7.0 6.0 - 8.3 g/dL   Albumin 4.1 3.5 - 5.2 g/dL   GFR 65.0 3.54 mL/min   Calcium 9.7 8.4 - 10.5 mg/dL  Lipid panel  Result Value Ref Range   Cholesterol 160 0 - 200 mg/dL  Triglycerides 76.0 0.0 - 149.0 mg/dL   HDL 56.50 >39.00 mg/dL   VLDL 15.2 0.0 - 40.0 mg/dL   LDL Cholesterol 89 0 - 99 mg/dL   Total CHOL/HDL Ratio 3    NonHDL 103.90   Vitamin B12  Result Value Ref Range   Vitamin B-12 >1500 (H) 211 - 911 pg/mL  CBC with Differential/Platelet  Result Value Ref Range   WBC 5.4 4.0 - 10.5 K/uL   RBC 3.57 (L) 3.87 - 5.11 Mil/uL   Hemoglobin 11.8 (L) 12.0 - 15.0 g/dL   HCT 35.5 (L) 36.0 - 46.0 %   MCV 99.5 78.0 - 100.0 fl   MCHC 33.2 30.0 - 36.0 g/dL   RDW 15.4 11.5 - 15.5 %   Platelets 227.0 150.0 - 400.0 K/uL   Neutrophils Relative % 71.6 43.0 - 77.0 %   Lymphocytes Relative 14.7 12.0 - 46.0 %   Monocytes Relative 7.8 3.0 - 12.0 %   Eosinophils Relative 4.7 0.0 - 5.0 %   Basophils Relative 1.2 0.0 - 3.0 %   Neutro Abs 3.9 1.4 - 7.7 K/uL   Lymphs Abs 0.8 0.7 - 4.0 K/uL   Monocytes Absolute 0.4 0.1 - 1.0 K/uL   Eosinophils Absolute 0.3 0.0 - 0.7 K/uL   Basophils Absolute 0.1 0.0 - 0.1 K/uL    This visit occurred during the SARS-CoV-2 public health emergency.  Safety protocols were in place,  including screening questions prior to the visit, additional usage of staff PPE, and extensive cleaning of exam room while observing appropriate contact time as indicated for disinfecting solutions.   COVID 19 screen:  No recent travel or known exposure to COVID19 The patient denies respiratory symptoms of COVID 19 at this time. The importance of social distancing was discussed today.   Assessment and Plan     Eliezer Lofts, MD

## 2020-10-02 NOTE — Patient Instructions (Addendum)
Start baby aspirin daily until cardiology seen.  Please stop at the lab to have labs drawn.  You will  be set up with cardiology referral for irregular heart rate.. possible atrial fibrillation as well as   Go to ER if chest pain pr shortness of breath

## 2020-10-02 NOTE — Telephone Encounter (Signed)
Scheduled patient with Dr. Cristal Deer for  10/03/20 at 3:40 pm.

## 2020-10-03 ENCOUNTER — Other Ambulatory Visit: Payer: Self-pay

## 2020-10-03 ENCOUNTER — Ambulatory Visit (INDEPENDENT_AMBULATORY_CARE_PROVIDER_SITE_OTHER): Payer: Medicare Other | Admitting: Cardiology

## 2020-10-03 ENCOUNTER — Encounter: Payer: Self-pay | Admitting: Cardiology

## 2020-10-03 VITALS — BP 168/60 | HR 45 | Ht 65.0 in | Wt 127.0 lb

## 2020-10-03 DIAGNOSIS — R030 Elevated blood-pressure reading, without diagnosis of hypertension: Secondary | ICD-10-CM | POA: Diagnosis not present

## 2020-10-03 DIAGNOSIS — I491 Atrial premature depolarization: Secondary | ICD-10-CM | POA: Diagnosis not present

## 2020-10-03 DIAGNOSIS — Z01810 Encounter for preprocedural cardiovascular examination: Secondary | ICD-10-CM | POA: Diagnosis not present

## 2020-10-03 DIAGNOSIS — Z7189 Other specified counseling: Secondary | ICD-10-CM | POA: Diagnosis not present

## 2020-10-03 DIAGNOSIS — I499 Cardiac arrhythmia, unspecified: Secondary | ICD-10-CM | POA: Diagnosis not present

## 2020-10-03 LAB — CBC WITH DIFFERENTIAL/PLATELET
Basophils Absolute: 0.1 10*3/uL (ref 0.0–0.1)
Basophils Relative: 1.1 % (ref 0.0–3.0)
Eosinophils Absolute: 0.4 10*3/uL (ref 0.0–0.7)
Eosinophils Relative: 5.2 % — ABNORMAL HIGH (ref 0.0–5.0)
HCT: 35.7 % — ABNORMAL LOW (ref 36.0–46.0)
Hemoglobin: 12.3 g/dL (ref 12.0–15.0)
Lymphocytes Relative: 12.4 % (ref 12.0–46.0)
Lymphs Abs: 0.9 10*3/uL (ref 0.7–4.0)
MCHC: 34.3 g/dL (ref 30.0–36.0)
MCV: 100.6 fl — ABNORMAL HIGH (ref 78.0–100.0)
Monocytes Absolute: 0.7 10*3/uL (ref 0.1–1.0)
Monocytes Relative: 10.4 % (ref 3.0–12.0)
Neutro Abs: 5 10*3/uL (ref 1.4–7.7)
Neutrophils Relative %: 70.9 % (ref 43.0–77.0)
Platelets: 255 10*3/uL (ref 150.0–400.0)
RBC: 3.55 Mil/uL — ABNORMAL LOW (ref 3.87–5.11)
RDW: 14.4 % (ref 11.5–15.5)
WBC: 7.1 10*3/uL (ref 4.0–10.5)

## 2020-10-03 LAB — COMPREHENSIVE METABOLIC PANEL
ALT: 16 U/L (ref 0–35)
AST: 26 U/L (ref 0–37)
Albumin: 4.4 g/dL (ref 3.5–5.2)
Alkaline Phosphatase: 71 U/L (ref 39–117)
BUN: 33 mg/dL — ABNORMAL HIGH (ref 6–23)
CO2: 24 mEq/L (ref 19–32)
Calcium: 9.7 mg/dL (ref 8.4–10.5)
Chloride: 104 mEq/L (ref 96–112)
Creatinine, Ser: 0.9 mg/dL (ref 0.40–1.20)
GFR: 57.62 mL/min — ABNORMAL LOW (ref >60.00)
Glucose, Bld: 80 mg/dL (ref 70–99)
Potassium: 4.5 mEq/L (ref 3.5–5.1)
Sodium: 137 mEq/L (ref 135–145)
Total Bilirubin: 0.5 mg/dL (ref 0.2–1.2)
Total Protein: 7.4 g/dL (ref 6.0–8.3)

## 2020-10-03 LAB — PROTIME-INR
INR: 1 ratio (ref 0.8–1.0)
Prothrombin Time: 11.1 s (ref 9.6–13.1)

## 2020-10-03 NOTE — Patient Instructions (Signed)

## 2020-10-03 NOTE — Progress Notes (Signed)
Cardiology Office Note:    Date:  10/03/2020   ID:  Kathryn [name], DOB 06-06-1931, MRN [id number]  PCP:  Jinny Sanders, MD  Cardiologist:  No primary care provider on file.  Referring MD: Jinny Sanders, MD   No chief complaint on file.   History of Present Illness:    Kathryn [name] is a 87 y.o. female with a hx of arthritis who is seen as a new consult at the request of Bedsole, Amy E, MD for the evaluation and management of preoperative cardiovascular risk assessment. She was previously seen by Dr. Oval Linsey but has not been seen since 2018.  Note from yesterday reviewed from Dr. Diona Browner. Noted to have irregular heart rate, possible atrial fibrillation. Referred urgently to cardiology given plans for surgery. I reviewed the ECG from 10/02/20. It is sinus rhythm with PACs. The automated read is atrial flutter-fibrillation.    Planned surgery: total knee arthroplasty, 11/24/20, Dr. Maureen Ralphs  Pertinent past cardiac history: denies history Prior cardiac workup: echo 2019, stress 2017 History of valve disease: denies History of CAD/PAD/CVA/TIA: denies History of heart failure: denies History of arrhythmia: Was told years ago she had an irregular heart beat many years ago, told it was not an issue. On anticoagulation: none chronically History of hypertension: denies, usually low. She has been very active today and is a bit nervous. History of diabetes: denies History of CKD: denies History of OSA: denies History of anesthesia complications: none, has had two hips and a knee done already. Current symptoms: Denies chest pain, shortness of breath at rest or with normal exertion. No PND, orthopnea, LE edema or unexpected weight gain. No syncope or palpitations. Functional capacity: walks 1 mile daily, cleans her entire house. No stairs in her house, but climbed up two flights recently at her friend's house without issue.  Past Medical History:  Diagnosis Date  . Allergy   . Arthritis   .  Colon polyps   . History of blood transfusion    with previous surgeries  . Hyperlipidemia   . Irregular heart rate   . PONV (postoperative nausea and vomiting)    hx of pt. states she thinks was from pain med.    Past Surgical History:  Procedure Laterality Date  . CARDIAC CATHETERIZATION    . HAND SURGERY Left   . TOTAL HIP ARTHROPLASTY  2007   LEFT  . TOTAL HIP ARTHROPLASTY Right 10/13/2016   Procedure: RIGHT TOTAL HIP ARTHROPLASTY ANTERIOR APPROACH;  Surgeon: Gaynelle Arabian, MD;  Location: [facility];  Service: Orthopedics;  Laterality: Right;  . TOTAL KNEE ARTHROPLASTY     Right   . TUBAL LIGATION      Current Medications: Current Outpatient Medications on File Prior to Visit  Medication Sig  . Calcium Carbonate-Vit D-Min (CALCIUM 1200 PO) Take 2 tablets by mouth daily.  . cetirizine (ZYRTEC) 10 MG tablet TAKE 1 TABLET DAILY  . Cyanocobalamin (VITAMIN B-12 PO) Take 1 tablet by mouth daily.  . Zinc Oxide-Vitamin C (ZINC PLUS VITAMIN C PO) Take by mouth.   No current facility-administered medications on file prior to visit.     Allergies:   Macrobid [nitrofurantoin monohyd macro]   Social History   Tobacco Use  . Smoking status: Never Smoker  . Smokeless tobacco: Never Used  Vaping Use  . Vaping Use: Never used  Substance Use Topics  . Alcohol use: Yes    Alcohol/week: 3.0 standard drinks    Types: 3 Standard drinks or  equivalent per week    Comment: Occassionally  . Drug use: No    Family History: family history includes Bone cancer in her sister; Breast cancer in her sister; Heart attack in her brother; Heart disease in her brother, brother, and sister; Hypertension in her brother and sister; Pancreatic cancer in her mother. There is no history of Colon cancer, Colon polyps, Esophageal cancer, Diabetes, Kidney disease, or Gallbladder disease.  ROS:   Please see the history of present illness.  Additional pertinent ROS: Constitutional: Negative for chills, fever,  night sweats, unintentional weight loss  HENT: Negative for ear pain and hearing loss.   Eyes: Negative for loss of vision and eye pain.  Respiratory: Negative for cough, sputum, wheezing.   Cardiovascular: See HPI. Gastrointestinal: Negative for abdominal pain, melena, and hematochezia.  Genitourinary: Negative for dysuria and hematuria.  Musculoskeletal: Negative for falls and myalgias.  Skin: Negative for itching and rash.  Neurological: Negative for focal weakness, focal sensory changes and loss of consciousness.  Endo/Heme/Allergies: Does not bruise/bleed easily.     EKGs/Labs/Other Studies Reviewed:    The following studies were reviewed today: Echo 12/27/2017 - Left ventricle: The cavity size was normal. Systolic function was  normal. The estimated ejection fraction was in the range of 50%  to 55%. Wall motion was normal; there were no regional wall  motion abnormalities. Doppler parameters are consistent with  abnormal left ventricular relaxation (grade 1 diastolic  dysfunction).  - Aortic valve: There was mild regurgitation.  - Aorta: Ascending aortic diameter: 39 mm (S).  - Ascending aorta: The ascending aorta was mildly dilated.  - Mitral valve: There was mild regurgitation.  - Left atrium: The atrium was mildly dilated.  - Right ventricle: The cavity size was normal. Wall thickness was  normal. Systolic function was normal.  - Atrial septum: No defect or patent foramen ovale was identified.  - Tricuspid valve: There was trivial regurgitation.  - Pulmonary arteries: Systolic pressure was within the normal  range. PA peak pressure: 30 mm Hg (S).   CT coronary 10/06/2016 Aortic root is dilated measuring 43 mm (left cusp) x 40 mm (right cusp) x 40 mm (non-coronary cusp).  Aortic Valve:  Trileaflet.  No calcifications.  Coronary Arteries:  Normal coronary origin.  Right dominance.  RCA is a large dominant artery that gives rise to PDA and PLVB. There  is no plaque.  Left main is a very large artery that gives rise to LAD and LCX arteries.  LAD is a large tortuous vessel that gives rise to two diagonal arteries. LAD has a minimal calcified plaque in the proximal segment with 0-25% stenosis.  D1 is rather small and has no obvious plaque.  D2 is a large tortuous vessel that has no plaque.  LCX is a large tortuous non-dominant artery that gives rise to one large OM1 branch. There is no plaque.  Other findings:  Normal pulmonary vein drainage into the left atrium.  Normal let atrial appendage without a thrombus.  Normal size of the pulmonary artery.  IMPRESSION: 1. Coronary calcium score of 2. This was 5 percentile for age and sex matched control.  2. Normal coronary origin with right dominance.  3. Minimal non-obstructive CAD.  Stress test 08/03/2016  Nuclear stress EF: 56%. Apical septal wall hypokinesis  Defect 1: There is a medium defect of severe severity present in the basal inferolateral, mid anteroseptal, mid inferolateral and apical septal location. Defect is fixed consistent with infarct.  Findings consistent with  prior myocardial infarction.  There was no ST segment deviation noted during stress.  This is an intermediate risk study. Infarct pattern as described above. No ischemia.  EKG:  EKG is personally reviewed.  The ekg ordered yesterday demonstrates sinus rhythm with frequent PACs  Recent Labs: 10/02/2020: ALT 16; BUN 33; Creatinine, Ser 0.90; Hemoglobin 12.3; Platelets 255.0; Potassium 4.5; Sodium 137  Recent Lipid Panel    Component Value Date/Time   CHOL 160 10/16/2019 1000   TRIG 76.0 10/16/2019 1000   HDL 56.50 10/16/2019 1000   CHOLHDL 3 10/16/2019 1000   VLDL 15.2 10/16/2019 1000   LDLCALC 89 10/16/2019 1000   LDLDIRECT 116.9 09/25/2008 1148    Physical Exam:    VS:  BP (!) 168/60   Pulse (!) 45   Ht 5\' 5"  (1.651 m)   Wt 127 lb (57.6 kg)   SpO2 96%   BMI 21.13 kg/m      Wt Readings from Last 3 Encounters:  10/03/20 127 lb (57.6 kg)  10/02/20 127 lb 8 oz (57.8 kg)  03/04/20 122 lb 4 oz (55.5 kg)    GEN: Well nourished, well developed in no acute distress HEENT: Normal, moist mucous membranes NECK: No JVD CARDIAC: irregular rhythm, normal S1 and S2, no rubs or gallops. 2/6 systolic murmur. VASCULAR: Radial and DP pulses 2+ bilaterally. No carotid bruits RESPIRATORY:  Clear to auscultation without rales, wheezing or rhonchi  ABDOMEN: Soft, non-tender, non-distended MUSCULOSKELETAL:  Ambulates independently SKIN: Warm and dry, no edema NEUROLOGIC:  Alert and oriented x 3. No focal neuro deficits noted. PSYCHIATRIC:  Normal affect    ASSESSMENT:    1. Preop cardiovascular exam   2. PAC (premature atrial contraction)   3. Irregular heart rate   4. Elevated blood pressure reading   5. Cardiac risk counseling   6. Counseling on health promotion and disease prevention    PLAN:    Preoperative cardiovascular risk: Based on available date, patient's RCRI score = 0, which carries a 3.9% 30-day risk of death, MI, or cardiac arrest.  The patient is not currently having active cardiac symptoms, and they can achieve >4 METs of activity.  According to ACC/AHA Guidelines, no further testing is needed.  Proceed with surgery at acceptable risk.  Our service is available as needed in the peri-operative period.     Irregular heart rhythm, concern for atrial fibrillation -ECG reviewed from yesterday. It is sinus rhythm with PACs. She reports a longstanding history of irregular heart beat. This is not bothersome to her, and she has no high risk features -no indication for anticoagulation -if becomes symptomatic, would consider Zio monitor  Single elevated blood pressure reading -denies history of hypertension, on no meds.  -well controlled at visit yesterday -monitor  Cardiac risk counseling and prevention recommendations: -recommend heart  healthy/Mediterranean diet, with whole grains, fruits, vegetable, fish, lean meats, nuts, and olive oil. Limit salt. -recommend moderate walking, 3-5 times/week for 30-50 minutes each session. Aim for at least 150 minutes.week. Goal should be pace of 3 miles/hours, or walking 1.5 miles in 30 minutes -recommend avoidance of tobacco products. Avoid excess alcohol.  Plan for follow up: as needed  Buford Dresser, MD, PhD, [facility]    Medication Adjustments/Labs and Tests Ordered: Current medicines are reviewed at length with the patient today.  Concerns regarding medicines are outlined above.  No orders of the defined types were placed in this encounter.  No orders of the defined types were  placed in this encounter.   Patient Instructions  Medication Instructions:  Your Physician recommend you continue on your current medication as directed.    *If you need a refill on your cardiac medications before your next appointment, please call your pharmacy*   Lab Work: None   Testing/Procedures: None   Follow-Up: At [facility], you and your health needs are our priority.  As part of our continuing mission to provide you with exceptional heart care, we have created designated Provider Care Teams.  These Care Teams include your primary Cardiologist (physician) and Advanced Practice Providers (APPs -  Physician Assistants and Nurse Practitioners) who all work together to provide you with the care you need, when you need it.  We recommend signing up for the patient portal called "MyChart".  Sign up information is provided on this After Visit Summary.  MyChart is used to connect with patients for Virtual Visits (Telemedicine).  Patients are able to view lab/test results, encounter notes, upcoming appointments, etc.  Non-urgent messages can be sent to your provider as well.   To learn more about what you can do with MyChart, go to [email or web address].     Your next appointment:   As needed  The format for your next appointment:   In Person  Provider:   Buford Dresser, MD      Signed, Buford Dresser, MD PhD 10/03/2020 6:02 PM    [place]

## 2020-10-25 ENCOUNTER — Telehealth: Payer: Self-pay | Admitting: Family Medicine

## 2020-10-25 DIAGNOSIS — D649 Anemia, unspecified: Secondary | ICD-10-CM

## 2020-10-25 DIAGNOSIS — E538 Deficiency of other specified B group vitamins: Secondary | ICD-10-CM

## 2020-10-25 DIAGNOSIS — E7800 Pure hypercholesterolemia, unspecified: Secondary | ICD-10-CM

## 2020-10-25 NOTE — Telephone Encounter (Signed)
-----   Message from [name] sent at 10/14/2020  2:48 PM EST ----- Regarding: Lab orders for Tuesday, 2.1.22 Patient is scheduled for CPX labs, please order future labs, Thanks , [name]

## 2020-10-28 ENCOUNTER — Ambulatory Visit (INDEPENDENT_AMBULATORY_CARE_PROVIDER_SITE_OTHER): Payer: Medicare Other

## 2020-10-28 ENCOUNTER — Other Ambulatory Visit: Payer: Self-pay

## 2020-10-28 ENCOUNTER — Other Ambulatory Visit (INDEPENDENT_AMBULATORY_CARE_PROVIDER_SITE_OTHER): Payer: Medicare Other

## 2020-10-28 DIAGNOSIS — D649 Anemia, unspecified: Secondary | ICD-10-CM

## 2020-10-28 DIAGNOSIS — E7800 Pure hypercholesterolemia, unspecified: Secondary | ICD-10-CM

## 2020-10-28 DIAGNOSIS — E538 Deficiency of other specified B group vitamins: Secondary | ICD-10-CM

## 2020-10-28 DIAGNOSIS — Z0000 Encounter for general adult medical examination without abnormal findings: Secondary | ICD-10-CM | POA: Diagnosis not present

## 2020-10-28 LAB — CBC WITH DIFFERENTIAL/PLATELET
Basophils Absolute: 0.1 10*3/uL (ref 0.0–0.1)
Basophils Relative: 1 % (ref 0.0–3.0)
Eosinophils Absolute: 0.6 10*3/uL (ref 0.0–0.7)
Eosinophils Relative: 10.6 % — ABNORMAL HIGH (ref 0.0–5.0)
HCT: 36.7 % (ref 36.0–46.0)
Hemoglobin: 12.3 g/dL (ref 12.0–15.0)
Lymphocytes Relative: 14 % (ref 12.0–46.0)
Lymphs Abs: 0.8 10*3/uL (ref 0.7–4.0)
MCHC: 33.5 g/dL (ref 30.0–36.0)
MCV: 100.1 fl — ABNORMAL HIGH (ref 78.0–100.0)
Monocytes Absolute: 0.6 10*3/uL (ref 0.1–1.0)
Monocytes Relative: 11 % (ref 3.0–12.0)
Neutro Abs: 3.4 10*3/uL (ref 1.4–7.7)
Neutrophils Relative %: 63.4 % (ref 43.0–77.0)
Platelets: 254 10*3/uL (ref 150.0–400.0)
RBC: 3.67 Mil/uL — ABNORMAL LOW (ref 3.87–5.11)
RDW: 13.8 % (ref 11.5–15.5)
WBC: 5.4 10*3/uL (ref 4.0–10.5)

## 2020-10-28 LAB — COMPREHENSIVE METABOLIC PANEL
ALT: 15 U/L (ref 0–35)
AST: 24 U/L (ref 0–37)
Albumin: 4 g/dL (ref 3.5–5.2)
Alkaline Phosphatase: 70 U/L (ref 39–117)
BUN: 30 mg/dL — ABNORMAL HIGH (ref 6–23)
CO2: 26 mEq/L (ref 19–32)
Calcium: 9.9 mg/dL (ref 8.4–10.5)
Chloride: 103 mEq/L (ref 96–112)
Creatinine, Ser: 0.79 mg/dL (ref 0.40–1.20)
GFR: 67.34 mL/min (ref >60.00)
Glucose, Bld: 87 mg/dL (ref 70–99)
Potassium: 4.5 mEq/L (ref 3.5–5.1)
Sodium: 141 mEq/L (ref 135–145)
Total Bilirubin: 0.6 mg/dL (ref 0.2–1.2)
Total Protein: 7.2 g/dL (ref 6.0–8.3)

## 2020-10-28 LAB — LIPID PANEL
Cholesterol: 202 mg/dL — ABNORMAL HIGH (ref 0–200)
HDL: 52.9 mg/dL (ref >39.00)
LDL Cholesterol: 130 mg/dL — ABNORMAL HIGH (ref 0–99)
NonHDL: 149.53
Total CHOL/HDL Ratio: 4
Triglycerides: 96 mg/dL (ref 0.0–149.0)
VLDL: 19.2 mg/dL (ref 0.0–40.0)

## 2020-10-28 LAB — VITAMIN B12: Vitamin B-12: >1526 pg/mL — ABNORMAL HIGH (ref 211–911)

## 2020-10-28 NOTE — Progress Notes (Signed)
PCP notes:  Health Maintenance: Tdap- insurance Covid- declined Dexa- declined   Abnormal Screenings: none   Patient concerns: Bilateral foot pain    Nurse concerns: none   Next PCP appt.: 11/04/2020 @ 11:20 am

## 2020-10-28 NOTE — Progress Notes (Signed)
Subjective:   Kathryn [name] is a 87 y.o. female who presents for Medicare Annual (Subsequent) preventive examination.  Review of Systems: N/A      I connected with the patient today by telephone and verified that I am speaking with the correct person using two identifiers. Location patient: home Location nurse: work Persons participating in the telephone visit: patient, nurse.   I discussed the limitations, risks, security and privacy concerns of performing an evaluation and management service by telephone and the availability of in person appointments. I also discussed with the patient that there may be a patient responsible charge related to this service. The patient expressed understanding and verbally consented to this telephonic visit.        Cardiac Risk Factors include: advanced age (>46men, >53 women)     Objective:    Today's Vitals   10/28/20 1020  PainSc: 7    There is no height or weight on file to calculate BMI.  Advanced Directives 10/28/2020 10/16/2019 10/09/2018 10/04/2017 10/13/2016 10/04/2016 07/13/2016  Does Patient Have a Medical Advance Directive? Yes Yes Yes Yes Yes Yes Yes  Type of Paramedic of [place];Living will [place];Living will Living will;Healthcare Power of [place];Living will [place];Living will [place];Living will [place];Living will  Does patient want to make changes to medical advance directive? - - - - No - Patient declined - No - Patient declined  Copy of [place] in Chart? No - copy requested No - copy requested No - copy requested No - copy requested No - copy requested No - copy requested No - copy requested    Current Medications (verified) Outpatient Encounter Medications as of 10/28/2020  Medication Sig  . Calcium Carbonate-Vit D-Min (CALCIUM 1200 PO) Take 2 tablets by mouth daily.  .  cetirizine (ZYRTEC) 10 MG tablet TAKE 1 TABLET DAILY  . Cyanocobalamin (VITAMIN B-12 PO) Take 1 tablet by mouth daily.  . Zinc Oxide-Vitamin C (ZINC PLUS VITAMIN C PO) Take by mouth.   No facility-administered encounter medications on file as of 10/28/2020.    Allergies (verified) Macrobid [nitrofurantoin monohyd macro]   History: Past Medical History:  Diagnosis Date  . Allergy   . Arthritis   . Colon polyps   . History of blood transfusion    with previous surgeries  . Hyperlipidemia   . Irregular heart rate   . PONV (postoperative nausea and vomiting)    hx of pt. states she thinks was from pain med.   Past Surgical History:  Procedure Laterality Date  . CARDIAC CATHETERIZATION    . HAND SURGERY Left   . TOTAL HIP ARTHROPLASTY  2007   LEFT  . TOTAL HIP ARTHROPLASTY Right 10/13/2016   Procedure: RIGHT TOTAL HIP ARTHROPLASTY ANTERIOR APPROACH;  Surgeon: Gaynelle Arabian, MD;  Location: [facility];  Service: Orthopedics;  Laterality: Right;  . TOTAL KNEE ARTHROPLASTY     Right   . TUBAL LIGATION     Family History  Problem Relation Age of Onset  . Pancreatic cancer Mother   . Hypertension Sister   . Heart disease Sister   . Breast cancer Sister        Age 34's  . Heart attack Brother   . Heart disease Brother   . Hypertension Brother   . Bone cancer Sister   . Heart disease Brother   . Colon cancer Neg Hx   .  Colon polyps Neg Hx   . Esophageal cancer Neg Hx   . Diabetes Neg Hx   . Kidney disease Neg Hx   . Gallbladder disease Neg Hx    Social History   Socioeconomic History  . Marital status: Married    Spouse name: Not on file  . Number of children: 1  . Years of education: Not on file  . Highest education level: Not on file  Occupational History  . Occupation: Chartered loss adjuster    Comment: Works at [employer]  . Smoking status: Never Smoker  . Smokeless tobacco: Never Used  Vaping Use  . Vaping Use: Never used  Substance and Sexual Activity  .  Alcohol use: Yes    Alcohol/week: 3.0 standard drinks    Types: 3 Standard drinks or equivalent per week    Comment: Occassionally  . Drug use: No  . Sexual activity: Never    Birth control/protection: Post-menopausal, Surgical  Other Topics Concern  . Not on file  Social History Narrative   Regular exercise-- yes, 3-4 times a week      Diet: limited water, some fruit and veggies      Full code, has living will, HCPOA, son Kathryn [name] ( reviewed 2015)   Social Determinants of Health   Financial Resource Strain: Low Risk   . Difficulty of Paying Living Expenses: Not hard at all  Food Insecurity: No Food Insecurity  . Worried About Charity fundraiser in the Last Year: Never true  . Ran Out of Food in the Last Year: Never true  Transportation Needs: No Transportation Needs  . Lack of Transportation (Medical): No  . Lack of Transportation (Non-Medical): No  Physical Activity: Sufficiently Active  . Days of Exercise per Week: 7 days  . Minutes of Exercise per Session: 30 min  Stress: No Stress Concern Present  . Feeling of Stress : Not at all  Social Connections: Not on file    Tobacco Counseling Counseling given: Not Answered   Clinical Intake:  Pre-visit preparation completed: Yes  Pain : 0-10 Pain Score: 7  Pain Type: Chronic pain Pain Location: Foot Pain Orientation: Left,Right Pain Descriptors / Indicators: Aching Pain Onset: More than a month ago Pain Frequency: Intermittent     Nutritional Risks: None Diabetes: No  How often do you need to have someone help you when you read instructions, pamphlets, or other written materials from your doctor or pharmacy?: 1 - Never What is the last grade level you completed in school?: 12th  Diabetic: No Nutrition Risk Assessment:  Has the patient had any N/V/D within the last 2 months?  No  Does the patient have any non-healing wounds?  No  Has the patient had any unintentional weight loss or weight gain?  No    Diabetes:  Is the patient diabetic?  No  If diabetic, was a CBG obtained today?  N/A Did the patient bring in their glucometer from home?  N/A How often do you monitor your CBG's? N/A.   Financial Strains and Diabetes Management:  Are you having any financial strains with the device, your supplies or your medication? N/A.  Does the patient want to be seen by Chronic Care Management for management of their diabetes?  N/A Would the patient like to be referred to a Nutritionist or for Diabetic Management?  N/A   Interpreter Needed?: No  Information entered by :: CJohnson, LPN   Activities of Daily Living In your present state  of health, do you have any difficulty performing the following activities: 10/28/2020  Hearing? N  Vision? N  Difficulty concentrating or making decisions? N  Walking or climbing stairs? N  Dressing or bathing? N  Doing errands, shopping? N  Preparing Food and eating ? N  Using the Toilet? N  In the past six months, have you accidently leaked urine? N  Do you have problems with loss of bowel control? N  Managing your Medications? N  Managing your Finances? N  Housekeeping or managing your Housekeeping? N  Some recent data might be hidden    Patient Care Team: Jinny Sanders, MD as PCP - General Marica Otter, OD as Referring Physician (Optometry)  Indicate any recent Medical Services you may have received from other than [facility] providers in the past year (date may be approximate).     Assessment:   This is a routine wellness examination for [place].  Hearing/Vision screen  Hearing Screening   125Hz  250Hz  500Hz  1000Hz  2000Hz  3000Hz  4000Hz  6000Hz  8000Hz   Right ear:           Left ear:           Vision Screening Comments: Patient gets annual eye exams  Dietary issues and exercise activities discussed: Current Exercise Habits: Home exercise routine, Type of exercise: walking, Time (Minutes): 30, Frequency (Times/Week): 7, Weekly Exercise  (Minutes/Week): 210, Intensity: Moderate, Exercise limited by: None identified  Goals    . Increase physical activity     Starting 10/09/2018, I will continue to walk for 20 minutes twice weekly and to continue walking on my job at [employer]     . Patient Stated     10/16/2019, I will maintain and continue medications as prescribed.     . Patient Stated     10/28/2020, I will continue to walk everyday for about 1 mile.       Depression Screen PHQ 2/9 Scores 10/28/2020 10/16/2019 10/09/2018 10/04/2017 07/13/2016 07/08/2015 04/09/2014  PHQ - 2 Score 0 0 0 0 0 0 0  PHQ- 9 Score 0 0 0 0 - - -    Fall Risk Fall Risk  10/28/2020 10/16/2019 10/09/2018 10/04/2017 07/13/2016  Falls in the past year? 1 1 1  No No  Comment - tripped and fell at grocery store fell at work; injury to back head; treatment in ER - -  Number falls in past yr: 0 0 0 - -  Injury with Fall? 0 0 1 - -  Risk for fall due to : Impaired balance/gait - - - -  Follow up Falls evaluation completed;Falls prevention discussed Falls evaluation completed;Falls prevention discussed - - -    FALL RISK PREVENTION PERTAINING TO THE HOME:  Any stairs in or around the home? Yes  If so, are there any without handrails? No  Home free of loose throw rugs in walkways, pet beds, electrical cords, etc? Yes  Adequate lighting in your home to reduce risk of falls? Yes   ASSISTIVE DEVICES UTILIZED TO PREVENT FALLS:  Life alert? No  Use of a cane, walker or w/c? No  Grab bars in the bathroom? No  Shower chair or bench in shower? No  Elevated toilet seat or a handicapped toilet? No   TIMED UP AND GO:  Was the test performed? N/A telephone visit.    Cognitive Function: MMSE - Mini Mental State Exam 10/28/2020 10/16/2019 10/09/2018 10/04/2017  Orientation to time 5 5 5 5   Orientation to Place 5 5 5  5  Registration 3 3 3 3   Attention/ Calculation 5 5 0 0  Recall 3 3 3 3   Language- name 2 objects - - 0 0  Language- repeat 1 1 1 1   Language- follow  3 step command - - 3 3  Language- read & follow direction - - 0 0  Write a sentence - - 0 0  Copy design - - 0 0  Total score - - 20 20  Mini Cog  Mini-Cog screen was completed. Maximum score is 22. A value of 0 denotes this part of the MMSE was not completed or the patient failed this part of the Mini-Cog screening.       Immunizations Immunization History  Administered Date(s) Administered  . Fluad Quad(high Dose 65+) 07/12/2019, 09/04/2020  . Influenza Split 08/29/2012  . Influenza Whole 06/27/2006, 07/14/2007, 06/18/2008, 08/05/2009, 06/23/2010  . Influenza,inj,Quad PF,6+ Mos 08/14/2013, 07/18/2014, 07/08/2015, 07/13/2016, 08/05/2017, 08/08/2018  . Influenza-Unspecified 07/13/2018  . Pneumococcal Conjugate-13 04/09/2014  . Pneumococcal Polysaccharide-23 09/27/2005  . Td 09/27/2004  . Zoster 06/18/2013    TDAP status: Due, Education has been provided regarding the importance of this vaccine. Advised may receive this vaccine at local pharmacy or Health Dept. Aware to provide a copy of the vaccination record if obtained from local pharmacy or Health Dept. Verbalized acceptance and understanding.  Flu Vaccine status: Up to date  Pneumococcal vaccine status: Up to date  Covid-19 vaccine status: Declined, Education has been provided regarding the importance of this vaccine but patient still declined. Advised may receive this vaccine at local pharmacy or Health Dept.or vaccine clinic. Aware to provide a copy of the vaccination record if obtained from local pharmacy or Health Dept. Verbalized acceptance and understanding.  Qualifies for Shingles Vaccine? Yes   Zostavax completed Yes   Shingrix Completed?: No.    Education has been provided regarding the importance of this vaccine. Patient has been advised to call insurance company to determine out of pocket expense if they have not yet received this vaccine. Advised may also receive vaccine at local pharmacy or Health Dept. Verbalized  acceptance and understanding.  Screening Tests Health Maintenance  Topic Date Due  . DEXA SCAN  10/28/2021 (Originally 08/15/2020)  . COVID-19 Vaccine (1) 11/13/2021 (Originally 08/01/1936)  . TETANUS/TDAP  10/29/2023 (Originally 09/27/2014)  . MAMMOGRAM  10/06/2021  . INFLUENZA VACCINE  Completed  . PNA vac Low Risk Adult  Completed    Health Maintenance  There are no preventive care reminders to display for this patient.  Colorectal cancer screening: No longer required.   Mammogram status: Completed 10/06/2020. Repeat every year  Bone Density status: declined  Lung Cancer Screening: (Low Dose CT Chest recommended if Age 87-80 years, 30 pack-year currently smoking OR have quit w/in 15 years.) does not qualify.    Additional Screening:  Hepatitis C Screening: does not qualify; Completed N/A  Vision Screening: Recommended annual ophthalmology exams for early detection of glaucoma and other disorders of the eye. Is the patient up to date with their annual eye exam?  Yes  Who is the provider or what is the name of the office in which the patient attends annual eye exams? [facility] If pt is not established with a provider, would they like to be referred to a provider to establish care? No .   Dental Screening: Recommended annual dental exams for proper oral hygiene  Community Resource Referral / Chronic Care Management: CRR required this visit?  No   CCM required this  visit?  No      Plan:     I have personally reviewed and noted the following in the patient's chart:   . Medical and social history . Use of alcohol, tobacco or illicit drugs  . Current medications and supplements . Functional ability and status . Nutritional status . Physical activity . Advanced directives . List of other physicians . Hospitalizations, surgeries, and ER visits in previous 12 months . Vitals . Screenings to include cognitive, depression, and falls . Referrals and  appointments  In addition, I have reviewed and discussed with patient certain preventive protocols, quality metrics, and best practice recommendations. A written personalized care plan for preventive services as well as general preventive health recommendations were provided to patient.   Due to this being a telephonic visit, the after visit summary with patients personalized plan was offered to patient via office or my-chart. Patient preferred to pick up at office at next visit or via mychart.   Andrez Grime, LPN   1/0/2725

## 2020-10-28 NOTE — Progress Notes (Signed)
No critical labs need to be addressed urgently. We will discuss labs in detail at upcoming office visit.   

## 2020-10-28 NOTE — Patient Instructions (Signed)
Kathryn [name] , Thank you for taking time to come for your Medicare Wellness Visit. I appreciate your ongoing commitment to your health goals. Please review the following plan we discussed and let me know if I can assist you in the future.   Screening recommendations/referrals: Colonoscopy: no longer required  Mammogram: Up to date, completed 10/06/2020, due 09/2021 Bone Density: declined Recommended yearly ophthalmology/optometry visit for glaucoma screening and checkup Recommended yearly dental visit for hygiene and checkup  Vaccinations: Influenza vaccine: Up to date, completed 09/04/2020, due 04/2021 Pneumococcal vaccine: Completed series Tdap vaccine: decline-insurance Shingles vaccine: due, check with your insurance regarding coverage if interested    Covid-19:declined  Advanced directives: Please bring a copy of your POA (Power of Attorney) and/or Living Will to your next appointment.   Conditions/risks identified: none  Next appointment: Follow up in one year for your annual wellness visit    Preventive Care 65 Years and Older, Female Preventive care refers to lifestyle choices and visits with your health care provider that can promote health and wellness. What does preventive care include?  A yearly physical exam. This is also called an annual well check.  Dental exams once or twice a year.  Routine eye exams. Ask your health care provider how often you should have your eyes checked.  Personal lifestyle choices, including:  Daily care of your teeth and gums.  Regular physical activity.  Eating a healthy diet.  Avoiding tobacco and drug use.  Limiting alcohol use.  Practicing safe sex.  Taking low-dose aspirin every day.  Taking vitamin and mineral supplements as recommended by your health care provider. What happens during an annual well check? The services and screenings done by your health care provider during your annual well check will depend on your age,  overall health, lifestyle risk factors, and family history of disease. Counseling  Your health care provider may ask you questions about your:  Alcohol use.  Tobacco use.  Drug use.  Emotional well-being.  Home and relationship well-being.  Sexual activity.  Eating habits.  History of falls.  Memory and ability to understand (cognition).  Work and work Statistician.  Reproductive health. Screening  You may have the following tests or measurements:  Height, weight, and BMI.  Blood pressure.  Lipid and cholesterol levels. These may be checked every 5 years, or more frequently if you are over 24 years old.  Skin check.  Lung cancer screening. You may have this screening every year starting at age 27 if you have a 30-pack-year history of smoking and currently smoke or have quit within the past 15 years.  Fecal occult blood test (FOBT) of the stool. You may have this test every year starting at age 28.  Flexible sigmoidoscopy or colonoscopy. You may have a sigmoidoscopy every 5 years or a colonoscopy every 10 years starting at age 59.  Hepatitis C blood test.  Hepatitis B blood test.  Sexually transmitted disease (STD) testing.  Diabetes screening. This is done by checking your blood sugar (glucose) after you have not eaten for a while (fasting). You may have this done every 1-3 years.  Bone density scan. This is done to screen for osteoporosis. You may have this done starting at age 63.  Mammogram. This may be done every 1-2 years. Talk to your health care provider about how often you should have regular mammograms. Talk with your health care provider about your test results, treatment options, and if necessary, the need for more tests. Vaccines  Your health care provider may recommend certain vaccines, such as:  Influenza vaccine. This is recommended every year.  Tetanus, diphtheria, and acellular pertussis (Tdap, Td) vaccine. You may need a Td booster every 10  years.  Zoster vaccine. You may need this after age 65.  Pneumococcal 13-valent conjugate (PCV13) vaccine. One dose is recommended after age 14.  Pneumococcal polysaccharide (PPSV23) vaccine. One dose is recommended after age 45. Talk to your health care provider about which screenings and vaccines you need and how often you need them. This information is not intended to replace advice given to you by your health care provider. Make sure you discuss any questions you have with your health care provider. Document Released: 10/10/2015 Document Revised: 06/02/2016 Document Reviewed: 07/15/2015 Elsevier Interactive Patient Education  2017 [place] Prevention in the Home Falls can cause injuries. They can happen to people of all ages. There are many things you can do to make your home safe and to help prevent falls. What can I do on the outside of my home?  Regularly fix the edges of walkways and driveways and fix any cracks.  Remove anything that might make you trip as you walk through a door, such as a raised step or threshold.  Trim any bushes or trees on the path to your home.  Use bright outdoor lighting.  Clear any walking paths of anything that might make someone trip, such as rocks or tools.  Regularly check to see if handrails are loose or broken. Make sure that both sides of any steps have handrails.  Any raised decks and porches should have guardrails on the edges.  Have any leaves, snow, or ice cleared regularly.  Use sand or salt on walking paths during winter.  Clean up any spills in your garage right away. This includes oil or grease spills. What can I do in the bathroom?  Use night lights.  Install grab bars by the toilet and in the tub and shower. Do not use towel bars as grab bars.  Use non-skid mats or decals in the tub or shower.  If you need to sit down in the shower, use a plastic, non-slip stool.  Keep the floor dry. Clean up any water that  spills on the floor as soon as it happens.  Remove soap buildup in the tub or shower regularly.  Attach bath mats securely with double-sided non-slip rug tape.  Do not have throw rugs and other things on the floor that can make you trip. What can I do in the bedroom?  Use night lights.  Make sure that you have a light by your bed that is easy to reach.  Do not use any sheets or blankets that are too big for your bed. They should not hang down onto the floor.  Have a firm chair that has side arms. You can use this for support while you get dressed.  Do not have throw rugs and other things on the floor that can make you trip. What can I do in the kitchen?  Clean up any spills right away.  Avoid walking on wet floors.  Keep items that you use a lot in easy-to-reach places.  If you need to reach something above you, use a strong step stool that has a grab bar.  Keep electrical cords out of the way.  Do not use floor polish or wax that makes floors slippery. If you must use wax, use non-skid floor wax.  Do  not have throw rugs and other things on the floor that can make you trip. What can I do with my stairs?  Do not leave any items on the stairs.  Make sure that there are handrails on both sides of the stairs and use them. Fix handrails that are broken or loose. Make sure that handrails are as long as the stairways.  Check any carpeting to make sure that it is firmly attached to the stairs. Fix any carpet that is loose or worn.  Avoid having throw rugs at the top or bottom of the stairs. If you do have throw rugs, attach them to the floor with carpet tape.  Make sure that you have a light switch at the top of the stairs and the bottom of the stairs. If you do not have them, ask someone to add them for you. What else can I do to help prevent falls?  Wear shoes that:  Do not have high heels.  Have rubber bottoms.  Are comfortable and fit you well.  Are closed at the  toe. Do not wear sandals.  If you use a stepladder:  Make sure that it is fully opened. Do not climb a closed stepladder.  Make sure that both sides of the stepladder are locked into place.  Ask someone to hold it for you, if possible.  Clearly mark and make sure that you can see:  Any grab bars or handrails.  First and last steps.  Where the edge of each step is.  Use tools that help you move around (mobility aids) if they are needed. These include:  Canes.  Walkers.  Scooters.  Crutches.  Turn on the lights when you go into a dark area. Replace any light bulbs as soon as they burn out.  Set up your furniture so you have a clear path. Avoid moving your furniture around.  If any of your floors are uneven, fix them.  If there are any pets around you, be aware of where they are.  Review your medicines with your doctor. Some medicines can make you feel dizzy. This can increase your chance of falling. Ask your doctor what other things that you can do to help prevent falls. This information is not intended to replace advice given to you by your health care provider. Make sure you discuss any questions you have with your health care provider. Document Released: 07/10/2009 Document Revised: 02/19/2016 Document Reviewed: 10/18/2014 Elsevier Interactive Patient Education  2017 [employer].

## 2020-11-04 ENCOUNTER — Other Ambulatory Visit: Payer: Self-pay

## 2020-11-04 ENCOUNTER — Encounter: Payer: Self-pay | Admitting: Family Medicine

## 2020-11-04 ENCOUNTER — Ambulatory Visit (INDEPENDENT_AMBULATORY_CARE_PROVIDER_SITE_OTHER): Payer: Medicare Other | Admitting: Family Medicine

## 2020-11-04 DIAGNOSIS — E7800 Pure hypercholesterolemia, unspecified: Secondary | ICD-10-CM | POA: Diagnosis not present

## 2020-11-04 DIAGNOSIS — E538 Deficiency of other specified B group vitamins: Secondary | ICD-10-CM

## 2020-11-04 DIAGNOSIS — M170 Bilateral primary osteoarthritis of knee: Secondary | ICD-10-CM | POA: Diagnosis not present

## 2020-11-04 DIAGNOSIS — D649 Anemia, unspecified: Secondary | ICD-10-CM

## 2020-11-04 NOTE — Patient Instructions (Addendum)
Work on lowering animal fats like cheese in diet. Increase activity as tolerated after surgery.  Can use diclofenac TOPICAL cream four times daily.

## 2020-11-04 NOTE — Progress Notes (Signed)
Patient ID: Kathryn [name], female    DOB: 02-02-1931, 87 y.o.   MRN: [id number]  This visit was conducted in person.  BP 127/77   Pulse 72   Temp 97.8 F (36.6 C) (Temporal)   Ht 5' 4.75" (1.645 m)   Wt 125 lb 8 oz (56.9 kg)   SpO2 96%   BMI 21.05 kg/m    CC:  Chief Complaint  Patient presents with  . Annual Exam    Part 2    Subjective:   HPI: Kathryn [name] is a 88 y.o. female presenting on 11/04/2020 for Annual Exam (Part 2)  The patient saw a LPN or RN for medicare wellness visit.  Prevention and wellness was reviewed in detail. Note reviewed and important notes copied below.  Health Maintenance: Tdap- insurance Covid- declined Dexa- declined   Abnormal Screenings: none   Elevated Cholesterol: Worsened control.. eating more cheese. Using medications without problems: Muscle aches:  Diet compliance: Changed Exercise: very active for age, walking Other complaints:        Relevant past medical, surgical, family and social history reviewed and updated as indicated. Interim medical history since our last visit reviewed. Allergies and medications reviewed and updated. Outpatient Medications Prior to Visit  Medication Sig Dispense Refill  . Calcium Carbonate-Vit D-Min (CALCIUM 1200 PO) Take 2 tablets by mouth daily.    . cetirizine (ZYRTEC) 10 MG tablet TAKE 1 TABLET DAILY 90 tablet 3  . Cyanocobalamin (VITAMIN B-12 PO) Take 1 tablet by mouth daily.    . Zinc Oxide-Vitamin C (ZINC PLUS VITAMIN C PO) Take by mouth.     No facility-administered medications prior to visit.     Per HPI unless specifically indicated in ROS section below Review of Systems  Constitutional: Negative for fatigue and fever.  HENT: Negative for congestion.   Eyes: Negative for pain.  Respiratory: Negative for cough and shortness of breath.   Cardiovascular: Negative for chest pain, palpitations and leg swelling.  Gastrointestinal: Negative for abdominal pain.   Genitourinary: Negative for dysuria and vaginal bleeding.  Musculoskeletal: Negative for back pain.  Neurological: Negative for syncope, light-headedness and headaches.  Psychiatric/Behavioral: Negative for dysphoric mood.   Objective:  BP 127/77   Pulse 72   Temp 97.8 F (36.6 C) (Temporal)   Ht 5' 4.75" (1.645 m)   Wt 125 lb 8 oz (56.9 kg)   SpO2 96%   BMI 21.05 kg/m   Wt Readings from Last 3 Encounters:  11/04/20 125 lb 8 oz (56.9 kg)  10/03/20 127 lb (57.6 kg)  10/02/20 127 lb 8 oz (57.8 kg)      Physical Exam Constitutional:      General: She is not in acute distress.    Appearance: Normal appearance. She is well-developed. She is not ill-appearing or toxic-appearing.  HENT:     Head: Normocephalic.     Right Ear: Hearing, tympanic membrane, ear canal and external ear normal.     Left Ear: Hearing, tympanic membrane, ear canal and external ear normal.     Nose: Nose normal.  Eyes:     General: Lids are normal. Lids are everted, no foreign bodies appreciated.     Conjunctiva/sclera: Conjunctivae normal.     Pupils: Pupils are equal, round, and reactive to light.  Neck:     Thyroid: No thyroid mass or thyromegaly.     Vascular: No carotid bruit.     Trachea: Trachea normal.  Cardiovascular:  Rate and Rhythm: Normal rate and regular rhythm.     Heart sounds: Normal heart sounds, S1 normal and S2 normal. No murmur heard. No gallop.   Pulmonary:     Effort: Pulmonary effort is normal. No respiratory distress.     Breath sounds: Normal breath sounds. No wheezing, rhonchi or rales.  Abdominal:     General: Bowel sounds are normal. There is no distension or abdominal bruit.     Palpations: Abdomen is soft. There is no fluid wave or mass.     Tenderness: There is no abdominal tenderness. There is no guarding or rebound.     Hernia: No hernia is present.  Musculoskeletal:     Cervical back: Normal range of motion and neck supple.     Comments: Joint deformity of  BIlateral knees and ankles  Lymphadenopathy:     Cervical: No cervical adenopathy.  Skin:    General: Skin is warm and dry.     Findings: No rash.  Neurological:     Mental Status: She is alert.     Cranial Nerves: No cranial nerve deficit.     Sensory: No sensory deficit.     Gait: Gait abnormal.  Psychiatric:        Mood and Affect: Mood is not anxious or depressed.        Speech: Speech normal.        Behavior: Behavior normal. Behavior is cooperative.        Judgment: Judgment normal.       Results for orders placed or performed in visit on 10/28/20  Vitamin B12  Result Value Ref Range   Vitamin B-12 >1526 (H) 211 - 911 pg/mL  Lipid panel  Result Value Ref Range   Cholesterol 202 (H) 0 - 200 mg/dL   Triglycerides 96.0 0.0 - 149.0 mg/dL   HDL 52.90 >39.00 mg/dL   VLDL 19.2 0.0 - 40.0 mg/dL   LDL Cholesterol 130 (H) 0 - 99 mg/dL   Total CHOL/HDL Ratio 4    NonHDL 149.53   Comprehensive metabolic panel  Result Value Ref Range   Sodium 141 135 - 145 mEq/L   Potassium 4.5 3.5 - 5.1 mEq/L   Chloride 103 96 - 112 mEq/L   CO2 26 19 - 32 mEq/L   Glucose, Bld 87 70 - 99 mg/dL   BUN 30 (H) 6 - 23 mg/dL   Creatinine, Ser 0.79 0.40 - 1.20 mg/dL   Total Bilirubin 0.6 0.2 - 1.2 mg/dL   Alkaline Phosphatase 70 39 - 117 U/L   AST 24 0 - 37 U/L   ALT 15 0 - 35 U/L   Total Protein 7.2 6.0 - 8.3 g/dL   Albumin 4.0 3.5 - 5.2 g/dL   GFR 67.34 >60.00 mL/min   Calcium 9.9 8.4 - 10.5 mg/dL  CBC with Differential/Platelet  Result Value Ref Range   WBC 5.4 4.0 - 10.5 K/uL   RBC 3.67 (L) 3.87 - 5.11 Mil/uL   Hemoglobin 12.3 12.0 - 15.0 g/dL   HCT 36.7 36.0 - 46.0 %   MCV 100.1 (H) 78.0 - 100.0 fl   MCHC 33.5 30.0 - 36.0 g/dL   RDW 13.8 11.5 - 15.5 %   Platelets 254.0 150.0 - 400.0 K/uL   Neutrophils Relative % 63.4 43.0 - 77.0 %   Lymphocytes Relative 14.0 12.0 - 46.0 %   Monocytes Relative 11.0 3.0 - 12.0 %   Eosinophils Relative 10.6 (H) 0.0 - 5.0 %  Basophils Relative 1.0  0.0 - 3.0 %   Neutro Abs 3.4 1.4 - 7.7 K/uL   Lymphs Abs 0.8 0.7 - 4.0 K/uL   Monocytes Absolute 0.6 0.1 - 1.0 K/uL   Eosinophils Absolute 0.6 0.0 - 0.7 K/uL   Basophils Absolute 0.1 0.0 - 0.1 K/uL    This visit occurred during the SARS-CoV-2 public health emergency.  Safety protocols were in place, including screening questions prior to the visit, additional usage of staff PPE, and extensive cleaning of exam room while observing appropriate contact time as indicated for disinfecting solutions.   COVID 19 screen:  No recent travel or known exposure to COVID19 The patient denies respiratory symptoms of COVID 19 at this time. The importance of social distancing was discussed today.   Assessment and Plan The patient's preventative maintenance and recommended screening tests for an annual wellness exam were reviewed in full today. Brought up to date unless services declined.  Counselled on the importance of diet, exercise, and its role in overall health and mortality. The patient's FH and SH was reviewed, including their home life, tobacco status, and drug and alcohol status.   Nonsmoker  Vaccines Uptodateflu, PNA, refused td, COVID Colon: Dr. Henrene Pastor, 2 polyps 09/2008,no further indicated due to age. PZWC:[id number] in spine, osteoporosis in forearm.. Pt refuses medication to treat.  Refuses further eval. Mammogram:1/2022nml, no further indicated,butshe request to continue given healthy. DVE/PAP: no indication due to age   Problem List Items Addressed This Visit    Anemia    Resolved.      B12 deficiency    Resolved on supplement      Elevated cholesterol (Chronic)    Work on lowering animal fats like cheese in diet. Increase activity as tolerated after surgery.       Osteoarthritis of both knees    Use diclofenac topical. prn knee pain.         Eliezer Lofts, MD

## 2020-11-11 DIAGNOSIS — I4819 Other persistent atrial fibrillation: Secondary | ICD-10-CM | POA: Insufficient documentation

## 2020-11-11 NOTE — Assessment & Plan Note (Signed)
EKG in office today is worrisome for aflutter or afib, rate  Controlled at 73, new in comparison to 2017  Refer to cardiology for further eval and cardiac pre-op clearance.

## 2020-11-11 NOTE — Assessment & Plan Note (Signed)
She is feeling well, no Chest pain, no SOB, no fatigue.  walking daily 25 mile  Past surgical complications: none Past cardiac history :  Stress test 2017  intermediate risk study 2019 ECHO EF 14-48%, stage 1 diastolic dysfunction ascending aortic aneurysm present but stable Type of surgery: ORTHO intermediate risk  Mets of activity able to perform:  4-10 mets  Needs cardiac eval given abnormal EKG today. Otherwise cleared for surgery once labs return.

## 2020-11-11 NOTE — Progress Notes (Signed)
DUE TO COVID-19 ONLY ONE VISITOR IS ALLOWED TO COME WITH YOU AND STAY IN THE WAITING ROOM ONLY DURING PRE OP AND PROCEDURE DAY OF SURGERY. THE 1 VISITOR  MAY VISIT WITH YOU AFTER SURGERY IN YOUR PRIVATE ROOM DURING VISITING HOURS ONLY!  YOU NEED TO HAVE A COVID 19 TEST ON__2/24/2022 _____ @_______ , THIS TEST MUST BE DONE BEFORE SURGERY,  COVID TESTING SITE [address] [place] [address], IT IS ON THE RIGHT GOING OUT [address] APPROXIMATELY  2 MINUTES PAST [employer] ON THE RIGHT. ONCE YOUR COVID TEST IS COMPLETED,  PLEASE BEGIN THE QUARANTINE INSTRUCTIONS AS OUTLINED IN YOUR HANDOUT.                Kathryn [name]  11/11/2020   Your procedure is scheduled on: 11/24/2020    Report to [facility] Main  Entrance   Report to admitting at     1115 AM     Call this number if you have problems the morning of surgery [phone]    REMEMBER: NO  SOLID FOOD CANDY OR GUM AFTER MIDNIGHT. CLEAR LIQUIDS UNTIL  1045am        . NOTHING BY MOUTH EXCEPT CLEAR LIQUIDS UNTIL    . PLEASE FINISH ENSURE DRINK PER SURGEON ORDER  WHICH NEEDS TO BE COMPLETED AT 1045am    .      CLEAR LIQUID DIET   Foods Allowed                                                                    Coffee and tea, regular and decaf                            Fruit ices (not with fruit pulp)                                      Iced Popsicles                                    Carbonated beverages, regular and diet                                    Cranberry, grape and apple juices Sports drinks like Gatorade Lightly seasoned clear broth or consume(fat free) Sugar, honey syrup ___________________________________________________________________      BRUSH YOUR TEETH MORNING OF SURGERY AND RINSE YOUR MOUTH OUT, NO CHEWING GUM CANDY OR MINTS.     Take these medicines the morning of surgery with A SIP OF WATER: zyrtec, flonase   DO NOT TAKE ANY DIABETIC MEDICATIONS DAY OF YOUR SURGERY                                You may not have any metal on your body including hair pins and              piercings  Do not  wear jewelry, make-up, lotions, powders or perfumes, deodorant             Do not wear nail polish on your fingernails.  Do not shave  48 hours prior to surgery.              Men may shave face and neck.   Do not bring valuables to the hospital. [place].  Contacts, dentures or bridgework may not be worn into surgery.  Leave suitcase in the car. After surgery it may be brought to your room.     Patients discharged the day of surgery will not be allowed to drive home. IF YOU ARE HAVING SURGERY AND GOING HOME THE SAME DAY, YOU MUST HAVE AN ADULT TO DRIVE YOU HOME AND BE WITH YOU FOR 24 HOURS. YOU MAY GO HOME BY TAXI OR UBER OR ORTHERWISE, BUT AN ADULT MUST ACCOMPANY YOU HOME AND STAY WITH YOU FOR 24 HOURS.  Name and phone number of your driver:  Special Instructions: N/A              Please read over the following fact sheets you were given: _____________________________________________________________________  [facility] - Preparing for Surgery Before surgery, you can play an important role.  Because skin is not sterile, your skin needs to be as free of germs as possible.  You can reduce the number of germs on your skin by washing with CHG (chlorahexidine gluconate) soap before surgery.  CHG is an antiseptic cleaner which kills germs and bonds with the skin to continue killing germs even after washing. Please DO NOT use if you have an allergy to CHG or antibacterial soaps.  If your skin becomes reddened/irritated stop using the CHG and inform your nurse when you arrive at Short Stay. Do not shave (including legs and underarms) for at least 48 hours prior to the first CHG shower.  You may shave your face/neck. Please follow these instructions carefully:  1.  Shower with CHG Soap the night before surgery and the  morning of  Surgery.  2.  If you choose to wash your hair, wash your hair first as usual with your  normal  shampoo.  3.  After you shampoo, rinse your hair and body thoroughly to remove the  shampoo.                           4.  Use CHG as you would any other liquid soap.  You can apply chg directly  to the skin and wash                       Gently with a scrungie or clean washcloth.  5.  Apply the CHG Soap to your body ONLY FROM THE NECK DOWN.   Do not use on face/ open                           Wound or open sores. Avoid contact with eyes, ears mouth and genitals (private parts).                       Wash face,  Genitals (private parts) with your normal soap.             6.  Wash thoroughly, paying  special attention to the area where your surgery  will be performed.  7.  Thoroughly rinse your body with warm water from the neck down.  8.  DO NOT shower/wash with your normal soap after using and rinsing off  the CHG Soap.                9.  Pat yourself dry with a clean towel.            10.  Wear clean pajamas.            11.  Place clean sheets on your bed the night of your first shower and do not  sleep with pets. Day of Surgery : Do not apply any lotions/deodorants the morning of surgery.  Please wear clean clothes to the hospital/surgery center.  FAILURE TO FOLLOW THESE INSTRUCTIONS MAY RESULT IN THE CANCELLATION OF YOUR SURGERY PATIENT SIGNATURE_________________________________  NURSE SIGNATURE__________________________________  ________________________________________________________________________

## 2020-11-11 NOTE — Progress Notes (Signed)
Subjective   Patient ID: Kathryn [name], female    DOB: 03/20/1931, 87 y.o.   MRN: [id number]  This visit was conducted in person.  BP 118/80   Pulse 94   Temp 97.6 F (36.4 C) (Temporal)   Ht 5\' 5"  (1.651 m)   Wt 127 lb 8 oz (57.8 kg)   SpO2 95%   BMI 21.22 kg/m    CC: preop exam Subjective:   HPI: Kathryn [name] is a 87 y.o. female presenting on 10/02/2020 for Pre-op Exam  She reports she is planning left total knee arthroplasty by Dr. Maureen Ralphs on 11/24/20.   She is feeling well, no Chest pain, no SOB, no fatigue.  walking daily 25 mile  Past surgical complications: none Past cardiac history :  Stress test 2017  intermediate risk study 2019 ECHO EF 46-96%, stage 1 diastolic dysfunction ascending aortic aneurysm present but stable Type of surgery: ORTHO intermediate risk  Mets of activity able to perform:  4-10 mets   EKG in office today is worrisome for aflutter or afib, rate  Controlled at 73, new in comparison to 2017   Body mass index is 21.22 kg/m.  Needs cbc, CMET, PT/INR, A1C and urinalysis  Relevant past medical, surgical, family and social history reviewed and updated as indicated. Interim medical history since our last visit reviewed. Allergies and medications reviewed and updated.       Outpatient Medications Prior to Visit  Medication Sig Dispense Refill  . Calcium Carbonate-Vit D-Min (CALCIUM 1200 PO) Take 2 tablets by mouth daily.    . cetirizine (ZYRTEC) 10 MG tablet TAKE 1 TABLET DAILY 90 tablet 3  . Cyanocobalamin (VITAMIN B-12 PO) Take 1 tablet by mouth daily.     No facility-administered medications prior to visit.     Per HPI unless specifically indicated in ROS section below Review of Systems Objective:  BP 118/80   Pulse 94   Temp 97.6 F (36.4 C) (Temporal)   Ht 5\' 5"  (1.651 m)   Wt 127 lb 8 oz (57.8 kg)   SpO2 95%   BMI 21.22 kg/m      Wt Readings from Last 3 Encounters:  10/02/20 127 lb 8 oz (57.8 kg)  03/04/20  122 lb 4 oz (55.5 kg)  10/23/19 119 lb 8 oz (54.2 kg)     Objective   Physical Exam Constitutional:      Appearance: She is normal weight.  HENT:     Head: Normocephalic and atraumatic.     Right Ear: Tympanic membrane normal.     Left Ear: Tympanic membrane normal.     Nose: Nose normal.     Mouth/Throat:     Mouth: Mucous membranes are moist.     Pharynx: Oropharynx is clear.  Eyes:     Conjunctiva/sclera: Conjunctivae normal.     Pupils: Pupils are equal, round, and reactive to light.  Neck:     Vascular: No carotid bruit.  Cardiovascular:     Rate and Rhythm: Tachycardia present. Rhythm irregular.     Pulses: Normal pulses.     Heart sounds: Normal heart sounds.  Pulmonary:     Effort: Pulmonary effort is normal.     Breath sounds: Normal breath sounds.  Abdominal:     General: Abdomen is flat. Bowel sounds are normal.     Palpations: Abdomen is soft.  Musculoskeletal:        General: Tenderness and deformity present.     Cervical back: Normal  range of motion.  Skin:    General: Skin is warm.     Capillary Refill: Capillary refill takes 2 to 3 seconds.  Neurological:     General: No focal deficit present.     Mental Status: She is alert and oriented to person, place, and time. Mental status is at baseline.  Psychiatric:        Mood and Affect: Mood normal.        Behavior: Behavior normal.        Thought Content: Thought content normal.        Judgment: Judgment normal.            Results for orders placed or performed in visit on 10/16/19  IBC + Ferritin  Result Value Ref Range   Iron 108 42 - 145 ug/dL   Transferrin 248.0 212.0 - 360.0 mg/dL   Saturation Ratios 31.1 20.0 - 50.0 %   Ferritin 25.8 10.0 - 291.0 ng/mL  Comprehensive metabolic panel  Result Value Ref Range   Sodium 139 135 - 145 mEq/L   Potassium 4.7 3.5 - 5.1 mEq/L   Chloride 106 96 - 112 mEq/L   CO2 27 19 - 32 mEq/L   Glucose, Bld 82 70 - 99 mg/dL   BUN 28 (H) 6 - 23 mg/dL    Creatinine, Ser 0.74 0.40 - 1.20 mg/dL   Total Bilirubin 0.8 0.2 - 1.2 mg/dL   Alkaline Phosphatase 66 39 - 117 U/L   AST 26 0 - 37 U/L   ALT 20 0 - 35 U/L   Total Protein 7.0 6.0 - 8.3 g/dL   Albumin 4.1 3.5 - 5.2 g/dL   GFR 74.38 >60.00 mL/min   Calcium 9.7 8.4 - 10.5 mg/dL  Lipid panel  Result Value Ref Range   Cholesterol 160 0 - 200 mg/dL   Triglycerides 76.0 0.0 - 149.0 mg/dL   HDL 56.50 >39.00 mg/dL   VLDL 15.2 0.0 - 40.0 mg/dL   LDL Cholesterol 89 0 - 99 mg/dL   Total CHOL/HDL Ratio 3    NonHDL 103.90   Vitamin B12  Result Value Ref Range   Vitamin B-12 >1500 (H) 211 - 911 pg/mL  CBC with Differential/Platelet  Result Value Ref Range   WBC 5.4 4.0 - 10.5 K/uL   RBC 3.57 (L) 3.87 - 5.11 Mil/uL   Hemoglobin 11.8 (L) 12.0 - 15.0 g/dL   HCT 35.5 (L) 36.0 - 46.0 %   MCV 99.5 78.0 - 100.0 fl   MCHC 33.2 30.0 - 36.0 g/dL   RDW 15.4 11.5 - 15.5 %   Platelets 227.0 150.0 - 400.0 K/uL   Neutrophils Relative % 71.6 43.0 - 77.0 %   Lymphocytes Relative 14.7 12.0 - 46.0 %   Monocytes Relative 7.8 3.0 - 12.0 %   Eosinophils Relative 4.7 0.0 - 5.0 %   Basophils Relative 1.2 0.0 - 3.0 %   Neutro Abs 3.9 1.4 - 7.7 K/uL   Lymphs Abs 0.8 0.7 - 4.0 K/uL   Monocytes Absolute 0.4 0.1 - 1.0 K/uL   Eosinophils Absolute 0.3 0.0 - 0.7 K/uL   Basophils Absolute 0.1 0.0 - 0.1 K/uL    This visit occurred during the SARS-CoV-2 public health emergency. Safety protocols were in place, including screening questions prior to the visit, additional usage of staff PPE, and extensive cleaning of exam room while observing appropriate contact time as indicated for disinfecting solutions.   COVID 19 screen:  No recent travel or known  exposure to COVID19 The patient denies respiratory symptoms of COVID 19 at this time. The importance of social distancing was discussed today.   Assessment and Plan  Problem List Items Addressed This Visit    Persistent  atrial fibrillation ([place])    EKG in office today is worrisome for aflutter or afib, rate  Controlled at 73, new in comparison to 2017  Refer to cardiology for further eval and cardiac pre-op clearance.      Relevant Orders   Protime-INR (Completed)   Ambulatory referral to Cardiology   Pre-op evaluation - Primary    She is feeling well, no Chest pain, no SOB, no fatigue.  walking daily 25 mile  Past surgical complications: none Past cardiac history :  Stress test 2017  intermediate risk study 2019 ECHO EF 03-12%, stage 1 diastolic dysfunction ascending aortic aneurysm present but stable Type of surgery: ORTHO intermediate risk  Mets of activity able to perform:  4-10 mets  Needs cardiac eval given abnormal EKG today. Otherwise cleared for surgery once labs return.      Relevant Orders   EKG 12-Lead (Completed)   Comprehensive metabolic panel (Completed)   CBC with Differential/Platelet (Completed)   Ambulatory referral to Cardiology        Eliezer Lofts, MD

## 2020-11-14 ENCOUNTER — Other Ambulatory Visit: Payer: Self-pay

## 2020-11-14 ENCOUNTER — Encounter (HOSPITAL_COMMUNITY)
Admission: RE | Admit: 2020-11-14 | Discharge: 2020-11-14 | Disposition: A | Discharge status: Home / Self Care | Payer: Medicare Other | Source: Ambulatory Visit | Attending: Orthopedic Surgery | Admitting: Orthopedic Surgery

## 2020-11-14 ENCOUNTER — Encounter (HOSPITAL_COMMUNITY): Payer: Self-pay

## 2020-11-14 DIAGNOSIS — Z01812 Encounter for preprocedural laboratory examination: Secondary | ICD-10-CM | POA: Diagnosis not present

## 2020-11-14 DIAGNOSIS — M1712 Unilateral primary osteoarthritis, left knee: Secondary | ICD-10-CM | POA: Diagnosis not present

## 2020-11-14 LAB — COMPREHENSIVE METABOLIC PANEL
ALT: 19 U/L (ref 0–44)
AST: 28 U/L (ref 15–41)
Albumin: 4.1 g/dL (ref 3.5–5.0)
Alkaline Phosphatase: 66 U/L (ref 38–126)
Anion gap: 12 (ref 5–15)
BUN: 29 mg/dL — ABNORMAL HIGH (ref 8–23)
CO2: 23 mmol/L (ref 22–32)
Calcium: 9.6 mg/dL (ref 8.9–10.3)
Chloride: 105 mmol/L (ref 98–111)
Creatinine, Ser: 0.82 mg/dL (ref 0.44–1.00)
GFR, Estimated: >60 mL/min (ref >60)
Glucose, Bld: 89 mg/dL (ref 70–99)
Potassium: 4.5 mmol/L (ref 3.5–5.1)
Sodium: 140 mmol/L (ref 135–145)
Total Bilirubin: 0.9 mg/dL (ref 0.3–1.2)
Total Protein: 7.1 g/dL (ref 6.5–8.1)

## 2020-11-14 LAB — PROTIME-INR
INR: 0.9 (ref 0.8–1.2)
Prothrombin Time: 12 seconds (ref 11.4–15.2)

## 2020-11-14 LAB — CBC
HCT: 36.2 % (ref 36.0–46.0)
Hemoglobin: 11.8 g/dL — ABNORMAL LOW (ref 12.0–15.0)
MCH: 33.5 pg (ref 26.0–34.0)
MCHC: 32.6 g/dL (ref 30.0–36.0)
MCV: 102.8 fL — ABNORMAL HIGH (ref 80.0–100.0)
Platelets: 249 10*3/uL (ref 150–400)
RBC: 3.52 MIL/uL — ABNORMAL LOW (ref 3.87–5.11)
RDW: 13.4 % (ref 11.5–15.5)
WBC: 5.8 10*3/uL (ref 4.0–10.5)
nRBC: 0 % (ref 0.0–0.2)

## 2020-11-14 LAB — SURGICAL PCR SCREEN
MRSA, PCR: NEGATIVE
Staphylococcus aureus: NEGATIVE

## 2020-11-14 LAB — APTT: aPTT: 32 seconds (ref 24–36)

## 2020-11-14 NOTE — Progress Notes (Addendum)
  PCP- DR Eliezer Lofts- 10/02/20- LOV on chart  Clearance dated 10/10/20 on chart  Cardiologist: Dr Buford Dresser- 10/03/20- Clearance - office visit 10/03/20  Chest x-ray : EKG :10/02/20 on chart  Echo : 2019  Stress test: 2017  Cardiac Cath :  Activity level: can do a flight of stairs without difficulty  Sleep Study/ CPAP :no  Fasting Blood Sugar :      / Checks Blood Sugar -- times a day:   Blood Thinner/ Instructions /Last Dose: ASA / Instructions/ Last Dose :

## 2020-11-17 NOTE — Progress Notes (Signed)
Anesthesia Chart Review:   Case: [id number] Date/Time: 11/24/20 1330   Procedure: TOTAL KNEE ARTHROPLASTY (Left Knee) - 65min   Anesthesia type: Choice   Pre-op diagnosis: Left knee osteoarthritis   Location: [facility] ROOM 09 / [facility]   Surgeons: Gaynelle Arabian, MD      DISCUSSION:  Pt is an 87 year old with hx irregular heart rate (PACs on EKG)  VS: BP 129/81   Pulse 76   Temp 37 C (Oral)   Resp 16   Ht 5\' 4"  (1.626 m)   Wt 56.9 kg   SpO2 99%   BMI 21.54 kg/m    PROVIDERS: - PCP is Jinny Sanders, MD. Last office visit 11/04/20 - Cardiologist is Buford Dresser, MD who cleared pt for surgery at initial office visit 10/03/20. F/u prn recommended  LABS: Labs reviewed: Acceptable for surgery. (all labs ordered are listed, but only abnormal results are displayed)  Labs Reviewed  CBC - Abnormal; Notable for the following components:      Result Value   RBC 3.52 (*)    Hemoglobin 11.8 (*)    MCV 102.8 (*)    All other components within normal limits  COMPREHENSIVE METABOLIC PANEL - Abnormal; Notable for the following components:   BUN 29 (*)    All other components within normal limits  SURGICAL PCR SCREEN  PROTIME-INR  APTT  TYPE AND SCREEN    EKG 10/02/20: sinus rhythm with PACs   CV: Echo 12/27/2017 - Left ventricle: The cavity size was normal. Systolic function was normal. The estimated ejection fraction was in the range of 50% to 55%. Wall motion was normal; there were no regional wall motion abnormalities. Doppler parameters are consistent with abnormal left ventricular relaxation (grade 1 diastolic dysfunction).  - Aortic valve: There was mild regurgitation.  - Aorta: Ascending aortic diameter: 39 mm (S).  - Ascending aorta: The ascending aorta was mildly dilated.  - Mitral valve: There was mild regurgitation.  - Left atrium: The atrium was mildly dilated.  - Right ventricle: The cavity size was normal. Wall thickness was normal. Systolic function was normal.  -  Atrial septum: No defect or patent foramen ovale was identified.  - Tricuspid valve: There was trivial regurgitation.  - Pulmonary arteries: Systolic pressure was within the normal range. PA peak pressure: 30 mm Hg (S).    CT coronary morphology 10/06/16:  1. Coronary calcium score of 2. This was 5 percentile for age and sex matched control. 2. Normal coronary origin with right dominance. 3. Minimal non-obstructive CAD.  Stress test 08/03/2016  Nuclear stress EF: 56%. Apical septal wall hypokinesis  Defect 1: There is a medium defect of severe severity present in the basal inferolateral, mid anteroseptal, mid inferolateral and apical septal location. Defect is fixed consistent with infarct.  Findings consistent with prior myocardial infarction.  There was no ST segment deviation noted during stress.  This is an intermediate risk study. Infarct pattern as described above. No ischemia    Past Medical History:  Diagnosis Date  . Allergy   . Arthritis   . Colon polyps   . History of blood transfusion    with previous surgeries  . Hyperlipidemia   . Irregular heart rate   . PONV (postoperative nausea and vomiting)    hx of pt. states she thinks was from pain med.    Past Surgical History:  Procedure Laterality Date  . CARDIAC CATHETERIZATION    . HAND SURGERY Left   .  TOTAL HIP ARTHROPLASTY  2007   LEFT  . TOTAL HIP ARTHROPLASTY Right 10/13/2016   Procedure: RIGHT TOTAL HIP ARTHROPLASTY ANTERIOR APPROACH;  Surgeon: Gaynelle Arabian, MD;  Location: [facility];  Service: Orthopedics;  Laterality: Right;  . TOTAL KNEE ARTHROPLASTY     Right   . TUBAL LIGATION      MEDICATIONS: . Calcium Carbonate-Vit D-Min (CALCIUM 1200 PO)  . cetirizine (ZYRTEC) 10 MG tablet  . Cyanocobalamin (VITAMIN B-12 PO)  . fluticasone (FLONASE) 50 MCG/ACT nasal spray  . vitamin C (ASCORBIC ACID) 500 MG tablet  . Zinc Oxide-Vitamin C (ZINC PLUS VITAMIN C PO)   No current facility-administered  medications for this encounter.    If no changes, I anticipate pt can proceed with surgery as scheduled.   Willeen Cass, PhD, FNP-BC [facility]/Anesthesiology Phone: [phone] 11/17/2020 1:01 PM

## 2020-11-17 NOTE — Anesthesia Preprocedure Evaluation (Addendum)
Anesthesia Evaluation  Patient identified by MRN, date of birth, ID band Patient awake    Reviewed: Allergy & Precautions, NPO status , Patient's Chart, lab work & pertinent test results  History of Anesthesia Complications (+) PONV and history of anesthetic complications (? possibly from pain meds)  Airway Mallampati: II  TM Distance: >3 FB Neck ROM: Full    Dental no notable dental hx. (+) Teeth Intact, Dental Advisory Given   Pulmonary neg pulmonary ROS,    Pulmonary exam normal breath sounds clear to auscultation       Cardiovascular +CHF (grade 1 diastolic dysfunction)  Normal cardiovascular exam+ dysrhythmias (1st degree avb) + Valvular Problems/Murmurs (mild AI, mild MR) MR and AI  Rhythm:Regular Rate:Normal   CV: Echo 12/27/2017 - Left ventricle: The cavity size was normal. Systolic function was normal. The estimated ejection fraction was in the range of 50% to 55%. Wall motion was normal; there were no regional wall motion abnormalities. Doppler parameters are consistent with abnormal left ventricular relaxation (grade 1 diastolic dysfunction).  - Aortic valve: There was mild regurgitation.  - Aorta: Ascending aortic diameter: 39 mm (S).  - Ascending aorta: The ascending aorta was mildly dilated.  - Mitral valve: There was mild regurgitation.  - Left atrium: The atrium was mildly dilated.  - Right ventricle: The cavity size was normal. Wall thickness was normal. Systolic function was normal.  - Atrial septum: No defect or patent foramen ovale was identified.  - Tricuspid valve: There was trivial regurgitation.  - Pulmonary arteries: Systolic pressure was within the normal range. PA peak pressure: 30 mm Hg (S).   CT coronary morphology 10/06/16:  1. Coronary calcium score of 2. This was 5 percentile for age and sex matched control. 2. Normal coronary origin with right dominance. 3. Minimal non-obstructive  CAD.  Stress test 08/03/2016  Nuclear stress EF: 56%. Apical septal wall hypokinesis  Defect 1: There is a medium defect of severe severity present in the basal inferolateral, mid anteroseptal, mid inferolateral and apical septal location. Defect is fixed consistent with infarct.  Findings consistent with prior myocardial infarction.  There was no ST segment deviation noted during stress.  This is an intermediate risk study. Infarct pattern as described above. No ischemia     Neuro/Psych negative neurological ROS  negative psych ROS   GI/Hepatic negative GI ROS, Neg liver ROS,   Endo/Other  negative endocrine ROS  Renal/GU negative Renal ROS  negative genitourinary   Musculoskeletal  (+) Arthritis , Osteoarthritis,  L knee OA    Abdominal   Peds  Hematology  (+) Blood dyscrasia, anemia , hct 36.2, plt 249   Anesthesia Other Findings   Reproductive/Obstetrics negative OB ROS                            Anesthesia Physical Anesthesia Plan  ASA: II  Anesthesia Plan: MAC, Regional and Spinal   Post-op Pain Management:  Regional for Post-op pain   Induction:   PONV Risk Score and Plan: 2 and Propofol infusion and TIVA  Airway Management Planned: Natural Airway and Simple Face Mask  Additional Equipment: None  Intra-op Plan:   Post-operative Plan:   Informed Consent: I have reviewed the patients History and Physical, chart, labs and discussed the procedure including the risks, benefits and alternatives for the proposed anesthesia with the patient or authorized representative who has indicated his/her understanding and acceptance.       Plan  Discussed with: CRNA  Anesthesia Plan Comments:       Anesthesia Quick Evaluation

## 2020-11-19 NOTE — H&P (Signed)
TOTAL KNEE ADMISSION H&P  Patient is being admitted for left total knee arthroplasty.  Subjective:  Chief Complaint:left knee pain.  HPI: Kathryn [name], 87 y.o. female, has a history of pain and functional disability in the left knee due to arthritis and has failed non-surgical conservative treatments for greater than 12 weeks to includecorticosteriod injections and activity modification.  Onset of symptoms was gradual, starting 3 years ago with gradually worsening course since that time. The patient noted no past surgery on the left knee(s).  Patient currently rates pain in the left knee(s) at 7 out of 10 with activity. Patient has worsening of pain with activity and weight bearing and pain that interferes with activities of daily living.  Patient has evidence of joint space narrowing by imaging studies.  There is no active infection.  Patient Active Problem List   Diagnosis Date Noted  . Persistent atrial fibrillation ([place]) 11/11/2020  . Osteoarthritis of left knee 08/04/2020  . Unusual smell in nose 03/04/2020  . Anemia 10/17/2018  . First degree AV block 07/13/2016  . Counseling regarding end of life decision making 07/08/2015  . Irregular heart rate 04/09/2014  . Elevated cholesterol   . Pre-op evaluation 02/02/2011  . B12 deficiency 09/26/2008  . LEG CRAMPS, NOCTURNAL 09/25/2008  . URINARY TRACT INFECTION, RECURRENT 07/11/2008  . Osteoporosis 06/28/2007  . ALLERGIC RHINITIS 05/31/2007  . Osteoarthritis, hand 05/31/2007   Past Medical History:  Diagnosis Date  . Allergy   . Arthritis   . Colon polyps   . History of blood transfusion    with previous surgeries  . Hyperlipidemia   . Irregular heart rate   . PONV (postoperative nausea and vomiting)    hx of pt. states she thinks was from pain med.    Past Surgical History:  Procedure Laterality Date  . CARDIAC CATHETERIZATION    . HAND SURGERY Left   . TOTAL HIP ARTHROPLASTY  2007   LEFT  . TOTAL HIP ARTHROPLASTY Right  10/13/2016   Procedure: RIGHT TOTAL HIP ARTHROPLASTY ANTERIOR APPROACH;  Surgeon: Gaynelle Arabian, MD;  Location[facility];  Service: Orthopedics;  Laterality: Right;  . TOTAL KNEE ARTHROPLASTY     Right   . TUBAL LIGATION      No current facility-administered medications for this encounter.   Current Outpatient Medications  Medication Sig Dispense Refill Last Dose  . Calcium Carbonate-Vit D-Min (CALCIUM 1200 PO) Take 2 tablets by mouth daily.     . cetirizine (ZYRTEC) 10 MG tablet TAKE 1 TABLET DAILY (Patient taking differently: Take 10 mg by mouth daily.) 90 tablet 3   . Cyanocobalamin (VITAMIN B-12 PO) Take 1 tablet by mouth daily.     . fluticasone (FLONASE) 50 MCG/ACT nasal spray Place 1-2 sprays into both nostrils daily.     . vitamin C (ASCORBIC ACID) 500 MG tablet Take 500 mg by mouth daily.     . Zinc Oxide-Vitamin C (ZINC PLUS VITAMIN C PO) Take 1 tablet by mouth daily.      Allergies  Allergen Reactions  . Macrobid [Nitrofurantoin Monohyd Macro]     Made pt sick to per stomach and shaky    Social History   Tobacco Use  . Smoking status: Never Smoker  . Smokeless tobacco: Never Used  Substance Use Topics  . Alcohol use: Yes    Alcohol/week: 3.0 standard drinks    Types: 3 Standard drinks or equivalent per week    Comment: Occassionally    Family History  Problem Relation Age of Onset  . Pancreatic cancer Mother   . Hypertension Sister   . Heart disease Sister   . Breast cancer Sister        Age 18's  . Heart attack Brother   . Heart disease Brother   . Hypertension Brother   . Bone cancer Sister   . Heart disease Brother   . Colon cancer Neg Hx   . Colon polyps Neg Hx   . Esophageal cancer Neg Hx   . Diabetes Neg Hx   . Kidney disease Neg Hx   . Gallbladder disease Neg Hx      Review of Systems  Constitutional: Negative for chills and fever.  Respiratory: Negative for cough and shortness of breath.   Cardiovascular: Negative for chest pain.   Gastrointestinal: Negative for nausea and vomiting.  Musculoskeletal: Positive for arthralgias.    Objective:  Physical Exam Patient is an 87 year old female.  Well nourished and well developed. General: Alert and oriented x3, cooperative and pleasant, no acute distress. Head: normocephalic, atraumatic, neck supple. Eyes: EOMI. Respiratory: breath sounds clear in all fields, no wheezing, rales, or rhonchi. Cardiovascular: Regular rate and rhythm, no murmurs, gallops or rubs.  Musculoskeletal:  Left Knee Exam: No effusion present. No swelling present. Valgus deformity. The range of motion is: 5 to 125 degrees. No crepitus on range of motion of the knee. Positive medial joint line tenderness. Positive lateral joint line tenderness. The knee is stable.  Calves soft and nontender. Motor function intact in LE. Strength 5/5 LE bilaterally. Neuro: Distal pulses 2+. Sensation to light touch intact in LE. Vital signs in last 24 hours:    Labs:   Estimated body mass index is 21.54 kg/m as calculated from the following:   Height as of 11/14/20: 5\' 4"  (1.626 m).   Weight as of 11/14/20: 56.9 kg.   Imaging Review Plain radiographs demonstrate severe degenerative joint disease of the left knee(s). The overall alignment isneutral. The bone quality appears to be adequate for age and reported activity level.      Assessment/Plan:  End stage arthritis, left knee   The patient history, physical examination, clinical judgment of the provider and imaging studies are consistent with end stage degenerative joint disease of the left knee(s) and total knee arthroplasty is deemed medically necessary. The treatment options including medical management, injection therapy arthroscopy and arthroplasty were discussed at length. The risks and benefits of total knee arthroplasty were presented and reviewed. The risks due to aseptic loosening, infection, stiffness, patella tracking problems,  thromboembolic complications and other imponderables were discussed. The patient acknowledged the explanation, agreed to proceed with the plan and consent was signed. Patient is being admitted for inpatient treatment for surgery, pain control, PT, OT, prophylactic antibiotics, VTE prophylaxis, progressive ambulation and ADL's and discharge planning. The patient is planning to be discharged home.   Therapy Plans: outpatient therapy at [facility] Disposition: Home with husband and son Planned DVT Prophylaxis: aspirin 325mg  BID DME needed: none PCP: Dr. Diona Browner Cardiologist: Dr. Shawna Orleans, clearance received (hx of PACs) TXA: IV Allergies: NKDA Anesthesia Concerns: none BMI: 20.6 Not diabetic.  Other: - Does well with oxycodone but says she needed something stronger with the right TKA  Patient's anticipated LOS is less than 2 midnights, meeting these requirements: - Lives within 1 hour of care - Has a competent adult at home to recover with post-op recover - NO history of  - Chronic pain requiring opiods  - Diabetes  -  Coronary Artery Disease  - Heart failure  - Heart attack  - Stroke  - DVT/VTE  - Cardiac arrhythmia  - Respiratory Failure/COPD  - Renal failure  - Anemia  - Advanced Liver disease  - Patient was instructed on what medications to stop prior to surgery. - Follow-up visit in 2 weeks with Dr. Wynelle Link - Begin physical therapy following surgery - Pre-operative lab work as pre-surgical testing - Prescriptions will be provided in hospital at time of discharge  Griffith Citron, PA-C Orthopedic Surgery [facility] [place] [phone]

## 2020-11-20 ENCOUNTER — Other Ambulatory Visit (HOSPITAL_COMMUNITY)
Admission: RE | Admit: 2020-11-20 | Discharge: 2020-11-20 | Disposition: A | Discharge status: Home / Self Care | Payer: Medicare Other | Source: Ambulatory Visit | Attending: Orthopedic Surgery | Admitting: Orthopedic Surgery

## 2020-11-20 DIAGNOSIS — Z20822 Contact with and (suspected) exposure to covid-19: Secondary | ICD-10-CM | POA: Insufficient documentation

## 2020-11-20 DIAGNOSIS — Z01812 Encounter for preprocedural laboratory examination: Secondary | ICD-10-CM | POA: Insufficient documentation

## 2020-11-21 LAB — SARS CORONAVIRUS 2 (TAT 6-24 HRS): SARS Coronavirus 2: NEGATIVE

## 2020-11-23 MED ORDER — BUPIVACAINE LIPOSOME 1.3 % IJ SUSP
20.0000 mL | Freq: Once | INTRAMUSCULAR | Status: DC
  Filled 2020-11-23: qty 20

## 2020-11-24 ENCOUNTER — Observation Stay (HOSPITAL_COMMUNITY)
Admission: RE | Admit: 2020-11-24 | Discharge: 2020-11-25 | Disposition: A | Discharge status: Home / Self Care | Payer: Medicare Other | Attending: Orthopedic Surgery | Admitting: Orthopedic Surgery

## 2020-11-24 ENCOUNTER — Ambulatory Visit (HOSPITAL_COMMUNITY): Payer: Medicare Other | Admitting: Certified Registered"

## 2020-11-24 ENCOUNTER — Ambulatory Visit (HOSPITAL_COMMUNITY): Payer: Medicare Other | Admitting: Emergency Medicine

## 2020-11-24 ENCOUNTER — Encounter (HOSPITAL_COMMUNITY): Payer: Self-pay | Admitting: Orthopedic Surgery

## 2020-11-24 ENCOUNTER — Other Ambulatory Visit: Payer: Self-pay

## 2020-11-24 ENCOUNTER — Encounter (HOSPITAL_COMMUNITY)
Admission: RE | Disposition: A | Discharge status: Home / Self Care | Payer: Self-pay | Source: Home / Self Care | Attending: Orthopedic Surgery

## 2020-11-24 DIAGNOSIS — Z8679 Personal history of other diseases of the circulatory system: Secondary | ICD-10-CM | POA: Insufficient documentation

## 2020-11-24 DIAGNOSIS — M1712 Unilateral primary osteoarthritis, left knee: Principal | ICD-10-CM | POA: Insufficient documentation

## 2020-11-24 DIAGNOSIS — I4819 Other persistent atrial fibrillation: Secondary | ICD-10-CM | POA: Insufficient documentation

## 2020-11-24 DIAGNOSIS — D649 Anemia, unspecified: Secondary | ICD-10-CM | POA: Diagnosis not present

## 2020-11-24 DIAGNOSIS — E785 Hyperlipidemia, unspecified: Secondary | ICD-10-CM | POA: Diagnosis not present

## 2020-11-24 DIAGNOSIS — G8918 Other acute postprocedural pain: Secondary | ICD-10-CM | POA: Diagnosis not present

## 2020-11-24 DIAGNOSIS — M170 Bilateral primary osteoarthritis of knee: Secondary | ICD-10-CM | POA: Diagnosis present

## 2020-11-24 DIAGNOSIS — M179 Osteoarthritis of knee, unspecified: Secondary | ICD-10-CM | POA: Diagnosis present

## 2020-11-24 DIAGNOSIS — M1710 Unilateral primary osteoarthritis, unspecified knee: Secondary | ICD-10-CM | POA: Diagnosis present

## 2020-11-24 DIAGNOSIS — Z92241 Personal history of systemic steroid therapy: Secondary | ICD-10-CM | POA: Insufficient documentation

## 2020-11-24 HISTORY — PX: TOTAL KNEE ARTHROPLASTY: SHX125

## 2020-11-24 LAB — TYPE AND SCREEN
ABO/RH(D): O POS
Antibody Screen: NEGATIVE

## 2020-11-24 SURGERY — ARTHROPLASTY, KNEE, TOTAL
Anesthesia: Monitor Anesthesia Care | Site: Knee | Laterality: Left

## 2020-11-24 MED ORDER — LIDOCAINE HCL (PF) 2 % IJ SOLN
INTRAMUSCULAR | Status: AC
  Filled 2020-11-24: qty 5

## 2020-11-24 MED ORDER — ROPIVACAINE HCL 5 MG/ML IJ SOLN
INTRAMUSCULAR | Status: DC | PRN
  Administered 2020-11-24: 30 mL via PERINEURAL

## 2020-11-24 MED ORDER — SODIUM CHLORIDE 0.9 % IR SOLN
Status: DC | PRN
  Administered 2020-11-24: 1000 mL

## 2020-11-24 MED ORDER — ACETAMINOPHEN 10 MG/ML IV SOLN
1000.0000 mg | Freq: Four times a day (QID) | INTRAVENOUS | Status: DC
  Administered 2020-11-24: 1000 mg via INTRAVENOUS
  Filled 2020-11-24: qty 100

## 2020-11-24 MED ORDER — PROMETHAZINE HCL 25 MG/ML IJ SOLN: 6.2500 mg | INTRAMUSCULAR | Status: DC | PRN

## 2020-11-24 MED ORDER — SODIUM CHLORIDE (PF) 0.9 % IJ SOLN
INTRAMUSCULAR | Status: DC | PRN
  Administered 2020-11-24: 60 mL

## 2020-11-24 MED ORDER — TRANEXAMIC ACID-NACL 1000-0.7 MG/100ML-% IV SOLN
1000.0000 mg | INTRAVENOUS | Status: AC
  Administered 2020-11-24: 1000 mg via INTRAVENOUS
  Filled 2020-11-24: qty 100

## 2020-11-24 MED ORDER — BISACODYL 10 MG RE SUPP: 10.0000 mg | Freq: Every day | RECTAL | Status: DC | PRN

## 2020-11-24 MED ORDER — SODIUM CHLORIDE 0.9 % IV SOLN: INTRAVENOUS | Status: DC

## 2020-11-24 MED ORDER — PHENOL 1.4 % MT LIQD: 1.0000 | OROMUCOSAL | Status: DC | PRN

## 2020-11-24 MED ORDER — DEXAMETHASONE SODIUM PHOSPHATE 10 MG/ML IJ SOLN
INTRAMUSCULAR | Status: DC | PRN
  Administered 2020-11-24: 10 mg

## 2020-11-24 MED ORDER — PROPOFOL 1000 MG/100ML IV EMUL
INTRAVENOUS | Status: AC
  Filled 2020-11-24: qty 100

## 2020-11-24 MED ORDER — METOCLOPRAMIDE HCL 5 MG/ML IJ SOLN: 5.0000 mg | Freq: Three times a day (TID) | INTRAMUSCULAR | Status: DC | PRN

## 2020-11-24 MED ORDER — MORPHINE SULFATE (PF) 2 MG/ML IV SOLN: 0.5000 mg | INTRAVENOUS | Status: DC | PRN

## 2020-11-24 MED ORDER — CEFAZOLIN SODIUM-DEXTROSE 2-4 GM/100ML-% IV SOLN
2.0000 g | INTRAVENOUS | Status: AC
  Administered 2020-11-24: 2 g via INTRAVENOUS
  Filled 2020-11-24: qty 100

## 2020-11-24 MED ORDER — ACETAMINOPHEN 500 MG PO TABS
1000.0000 mg | ORAL_TABLET | Freq: Four times a day (QID) | ORAL | Status: DC
  Administered 2020-11-24 – 2020-11-25 (×3): 1000 mg via ORAL
  Filled 2020-11-24 (×3): qty 2

## 2020-11-24 MED ORDER — METHOCARBAMOL 500 MG PO TABS
500.0000 mg | ORAL_TABLET | Freq: Four times a day (QID) | ORAL | Status: DC | PRN
  Administered 2020-11-24: 500 mg via ORAL
  Filled 2020-11-24: qty 1

## 2020-11-24 MED ORDER — CEFAZOLIN SODIUM-DEXTROSE 2-4 GM/100ML-% IV SOLN
2.0000 g | Freq: Four times a day (QID) | INTRAVENOUS | Status: AC
Start: 2020-11-24 — End: 2020-11-25
  Administered 2020-11-24 – 2020-11-25 (×2): 2 g via INTRAVENOUS
  Filled 2020-11-24 (×2): qty 100

## 2020-11-24 MED ORDER — DOCUSATE SODIUM 100 MG PO CAPS
100.0000 mg | ORAL_CAPSULE | Freq: Two times a day (BID) | ORAL | Status: DC
  Administered 2020-11-24 – 2020-11-25 (×2): 100 mg via ORAL
  Filled 2020-11-24 (×2): qty 1

## 2020-11-24 MED ORDER — DEXAMETHASONE SODIUM PHOSPHATE 10 MG/ML IJ SOLN
10.0000 mg | Freq: Once | INTRAMUSCULAR | Status: AC
  Administered 2020-11-25: 10 mg via INTRAVENOUS
  Filled 2020-11-24: qty 1

## 2020-11-24 MED ORDER — PROPOFOL 500 MG/50ML IV EMUL
INTRAVENOUS | Status: DC | PRN
  Administered 2020-11-24: 40 ug/kg/min via INTRAVENOUS

## 2020-11-24 MED ORDER — LIDOCAINE 2% (20 MG/ML) 5 ML SYRINGE
INTRAMUSCULAR | Status: DC | PRN
  Administered 2020-11-24: 20 mg via INTRAVENOUS

## 2020-11-24 MED ORDER — ASPIRIN EC 325 MG PO TBEC
325.0000 mg | DELAYED_RELEASE_TABLET | Freq: Two times a day (BID) | ORAL | Status: DC
  Administered 2020-11-25: 325 mg via ORAL
  Filled 2020-11-24: qty 1

## 2020-11-24 MED ORDER — FENTANYL CITRATE (PF) 100 MCG/2ML IJ SOLN
50.0000 ug | INTRAMUSCULAR | Status: DC
  Administered 2020-11-24: 100 ug via INTRAVENOUS
  Filled 2020-11-24: qty 2

## 2020-11-24 MED ORDER — ONDANSETRON HCL 4 MG/2ML IJ SOLN
INTRAMUSCULAR | Status: AC
  Filled 2020-11-24: qty 2

## 2020-11-24 MED ORDER — MENTHOL 3 MG MT LOZG: 1.0000 | LOZENGE | OROMUCOSAL | Status: DC | PRN

## 2020-11-24 MED ORDER — BUPIVACAINE IN DEXTROSE 0.75-8.25 % IT SOLN
INTRATHECAL | Status: DC | PRN
  Administered 2020-11-24: 1.6 mL via INTRATHECAL

## 2020-11-24 MED ORDER — ONDANSETRON HCL 4 MG/2ML IJ SOLN: 4.0000 mg | Freq: Four times a day (QID) | INTRAMUSCULAR | Status: DC | PRN

## 2020-11-24 MED ORDER — PROPOFOL 10 MG/ML IV BOLUS
INTRAVENOUS | Status: DC | PRN
  Administered 2020-11-24 (×2): 20 mg via INTRAVENOUS

## 2020-11-24 MED ORDER — FLUTICASONE PROPIONATE 50 MCG/ACT NA SUSP
1.0000 | Freq: Every day | NASAL | Status: DC
  Administered 2020-11-25: 2 via NASAL
  Filled 2020-11-24: qty 16

## 2020-11-24 MED ORDER — METHOCARBAMOL 500 MG IVPB - SIMPLE MED
500.0000 mg | Freq: Four times a day (QID) | INTRAVENOUS | Status: DC | PRN
  Filled 2020-11-24: qty 50

## 2020-11-24 MED ORDER — LORATADINE 10 MG PO TABS
10.0000 mg | ORAL_TABLET | Freq: Every day | ORAL | Status: DC
  Administered 2020-11-24 – 2020-11-25 (×2): 10 mg via ORAL
  Filled 2020-11-24 (×2): qty 1

## 2020-11-24 MED ORDER — ONDANSETRON HCL 4 MG PO TABS: 4.0000 mg | ORAL_TABLET | Freq: Four times a day (QID) | ORAL | Status: DC | PRN

## 2020-11-24 MED ORDER — POLYETHYLENE GLYCOL 3350 17 G PO PACK: 17.0000 g | PACK | Freq: Every day | ORAL | Status: DC | PRN

## 2020-11-24 MED ORDER — ONDANSETRON HCL 4 MG/2ML IJ SOLN
INTRAMUSCULAR | Status: DC | PRN
  Administered 2020-11-24: 4 mg via INTRAVENOUS

## 2020-11-24 MED ORDER — TRAMADOL HCL 50 MG PO TABS
50.0000 mg | ORAL_TABLET | Freq: Four times a day (QID) | ORAL | Status: DC | PRN
  Administered 2020-11-24 – 2020-11-25 (×2): 50 mg via ORAL
  Filled 2020-11-24: qty 2
  Filled 2020-11-24 (×2): qty 1

## 2020-11-24 MED ORDER — OXYCODONE HCL 5 MG/5ML PO SOLN
5.0000 mg | Freq: Once | ORAL | Status: DC | PRN
Start: 2020-11-24 — End: 2020-11-24

## 2020-11-24 MED ORDER — POVIDONE-IODINE 10 % EX SWAB
2.0000 "application " | Freq: Once | CUTANEOUS | Status: AC
  Administered 2020-11-24: 2 via TOPICAL

## 2020-11-24 MED ORDER — LACTATED RINGERS IV SOLN: INTRAVENOUS | Status: DC

## 2020-11-24 MED ORDER — OXYCODONE HCL 5 MG PO TABS
5.0000 mg | ORAL_TABLET | ORAL | Status: DC | PRN
  Administered 2020-11-25 (×4): 10 mg via ORAL
  Filled 2020-11-24 (×4): qty 2

## 2020-11-24 MED ORDER — PHENYLEPHRINE 40 MCG/ML (10ML) SYRINGE FOR IV PUSH (FOR BLOOD PRESSURE SUPPORT)
PREFILLED_SYRINGE | INTRAVENOUS | Status: DC | PRN
  Administered 2020-11-24: 80 ug via INTRAVENOUS

## 2020-11-24 MED ORDER — DEXAMETHASONE SODIUM PHOSPHATE 10 MG/ML IJ SOLN
INTRAMUSCULAR | Status: AC
  Filled 2020-11-24: qty 1

## 2020-11-24 MED ORDER — DIPHENHYDRAMINE HCL 12.5 MG/5ML PO ELIX: 12.5000 mg | ORAL_SOLUTION | ORAL | Status: DC | PRN

## 2020-11-24 MED ORDER — METOCLOPRAMIDE HCL 5 MG PO TABS: 5.0000 mg | ORAL_TABLET | Freq: Three times a day (TID) | ORAL | Status: DC | PRN

## 2020-11-24 MED ORDER — OXYCODONE HCL 5 MG PO TABS
5.0000 mg | ORAL_TABLET | Freq: Once | ORAL | Status: DC | PRN
Start: 2020-11-24 — End: 2020-11-24

## 2020-11-24 MED ORDER — FENTANYL CITRATE (PF) 100 MCG/2ML IJ SOLN: 25.0000 ug | INTRAMUSCULAR | Status: DC | PRN

## 2020-11-24 MED ORDER — DEXAMETHASONE SODIUM PHOSPHATE 10 MG/ML IJ SOLN
8.0000 mg | Freq: Once | INTRAMUSCULAR | Status: AC
  Administered 2020-11-24: 8 mg via INTRAVENOUS

## 2020-11-24 MED ORDER — BUPIVACAINE LIPOSOME 1.3 % IJ SUSP
INTRAMUSCULAR | Status: DC | PRN
  Administered 2020-11-24: 20 mL

## 2020-11-24 MED ORDER — FLEET ENEMA 7-19 GM/118ML RE ENEM: 1.0000 | ENEMA | Freq: Once | RECTAL | Status: DC | PRN

## 2020-11-24 MED ORDER — PROPOFOL 10 MG/ML IV BOLUS
INTRAVENOUS | Status: AC
  Filled 2020-11-24: qty 20

## 2020-11-24 SURGICAL SUPPLY — 59 items
ATTUNE MED DOME PAT 41 KNEE (Knees) ×1 IMPLANT
ATTUNE PS FEM LT SZ 8 CEM KNEE (Femur) ×1 IMPLANT
ATTUNE PSRP INSR SZ8 8 KNEE (Insert) ×1 IMPLANT
BAG SPEC THK2 15X12 ZIP CLS (MISCELLANEOUS) ×1
BAG ZIPLOCK 12X15 (MISCELLANEOUS) ×2 IMPLANT
BASE TIBIAL ROT PLAT SZ 8 KNEE (Knees) IMPLANT
BLADE SAG 18X100X1.27 (BLADE) ×2 IMPLANT
BLADE SAW SGTL 11.0X1.19X90.0M (BLADE) ×2 IMPLANT
BNDG CMPR MED 10X6 ELC LF (GAUZE/BANDAGES/DRESSINGS) ×1
BNDG ELASTIC 6X10 VLCR STRL LF (GAUZE/BANDAGES/DRESSINGS) ×1 IMPLANT
BNDG ELASTIC 6X5.8 VLCR STR LF (GAUZE/BANDAGES/DRESSINGS) ×2 IMPLANT
BOWL SMART MIX CTS (DISPOSABLE) ×2 IMPLANT
BSPLAT TIB 8 CMNT ROT PLAT STR (Knees) ×1 IMPLANT
CEMENT HV SMART SET (Cement) ×4 IMPLANT
CLSR STERI-STRIP ANTIMIC 1/2X4 (GAUZE/BANDAGES/DRESSINGS) ×1 IMPLANT
COVER SURGICAL LIGHT HANDLE (MISCELLANEOUS) ×2 IMPLANT
COVER WAND RF STERILE (DRAPES) IMPLANT
CUFF TOURN SGL QUICK 34 (TOURNIQUET CUFF) ×2
CUFF TRNQT CYL 34X4.125X (TOURNIQUET CUFF) ×1 IMPLANT
DECANTER SPIKE VIAL GLASS SM (MISCELLANEOUS) ×2 IMPLANT
DRAPE U-SHAPE 47X51 STRL (DRAPES) ×2 IMPLANT
DRESSING AQUACEL AG SP 3.5X10 (GAUZE/BANDAGES/DRESSINGS) IMPLANT
DRSG AQUACEL AG ADV 3.5X10 (GAUZE/BANDAGES/DRESSINGS) ×2 IMPLANT
DRSG AQUACEL AG SP 3.5X10 (GAUZE/BANDAGES/DRESSINGS) ×2
DURAPREP 26ML APPLICATOR (WOUND CARE) ×2 IMPLANT
ELECT REM PT RETURN 15FT ADLT (MISCELLANEOUS) ×2 IMPLANT
GLOVE SRG 8 PF TXTR STRL LF DI (GLOVE) ×1 IMPLANT
GLOVE SURG ENC MOIS LTX SZ6 (GLOVE) IMPLANT
GLOVE SURG ENC MOIS LTX SZ7 (GLOVE) ×2 IMPLANT
GLOVE SURG ENC MOIS LTX SZ8 (GLOVE) ×2 IMPLANT
GLOVE SURG UNDER POLY LF SZ6.5 (GLOVE) ×2 IMPLANT
GLOVE SURG UNDER POLY LF SZ8 (GLOVE) ×2
GLOVE SURG UNDER POLY LF SZ8.5 (GLOVE) ×2 IMPLANT
GOWN STRL REUS W/TWL LRG LVL3 (GOWN DISPOSABLE) ×4 IMPLANT
GOWN STRL REUS W/TWL XL LVL3 (GOWN DISPOSABLE) ×2 IMPLANT
HANDPIECE INTERPULSE COAX TIP (DISPOSABLE) ×2
HOLDER FOLEY CATH W/STRAP (MISCELLANEOUS) IMPLANT
IMMOBILIZER KNEE 20 (SOFTGOODS) ×2
IMMOBILIZER KNEE 20 THIGH 36 (SOFTGOODS) ×1 IMPLANT
KIT TURNOVER KIT A (KITS) ×2 IMPLANT
MANIFOLD NEPTUNE II (INSTRUMENTS) ×2 IMPLANT
NS IRRIG 1000ML POUR BTL (IV SOLUTION) ×2 IMPLANT
PACK TOTAL KNEE CUSTOM (KITS) ×2 IMPLANT
PADDING CAST COTTON 6X4 STRL (CAST SUPPLIES) ×3 IMPLANT
PENCIL SMOKE EVACUATOR (MISCELLANEOUS) ×2 IMPLANT
PIN DRILL FIX HALF THREAD (BIT) ×1 IMPLANT
PIN STEINMAN FIXATION KNEE (PIN) ×1 IMPLANT
PROTECTOR NERVE ULNAR (MISCELLANEOUS) ×2 IMPLANT
SET HNDPC FAN SPRY TIP SCT (DISPOSABLE) ×1 IMPLANT
STRIP CLOSURE SKIN 1/2X4 (GAUZE/BANDAGES/DRESSINGS) ×4 IMPLANT
SUT MNCRL AB 4-0 PS2 18 (SUTURE) ×2 IMPLANT
SUT STRATAFIX 0 PDS 27 VIOLET (SUTURE) ×2
SUT VIC AB 2-0 CT1 27 (SUTURE) ×6
SUT VIC AB 2-0 CT1 TAPERPNT 27 (SUTURE) ×3 IMPLANT
SUTURE STRATFX 0 PDS 27 VIOLET (SUTURE) ×1 IMPLANT
TIBIAL BASE ROT PLAT SZ 8 KNEE (Knees) ×2 IMPLANT
TRAY FOLEY MTR SLVR 16FR STAT (SET/KITS/TRAYS/PACK) ×2 IMPLANT
WATER STERILE IRR 1000ML POUR (IV SOLUTION) ×4 IMPLANT
WRAP KNEE MAXI GEL POST OP (GAUZE/BANDAGES/DRESSINGS) ×2 IMPLANT

## 2020-11-24 NOTE — Care Plan (Signed)
Ortho Bundle Case Management Note  Patient Details  Name: Kathryn [name] MRN: [id number] Date of Birth: [birth year]                  L TKA on 11/24/20. DCP: Home with husband & son. 1 story home with 1 step. DME: No needs. Has RW & 3in1. PT: EO 3/3   DME Arranged:  N/A DME Agency:     HH Arranged:    HH Agency:     Additional Comments: Please contact me with any questions of if this plan should need to change.  Marianne Sofia, RN,CCM [facility]  [phone] 11/24/2020, 1:55 PM

## 2020-11-24 NOTE — Progress Notes (Signed)
Orthopedic Tech Progress Note Patient Details:  Kathryn [name] Jul 01, 1931 [id number]  Ortho Devices Type of Ortho Device: CPM padding Ortho Device/Splint Location: off cpm Ortho Device/Splint Interventions: Ordered,Application   Post Interventions Patient Tolerated: Well Instructions Provided: Adjustment of device,Care of device   Braulio Bosch 11/24/2020, 7:29 PM

## 2020-11-24 NOTE — Anesthesia Procedure Notes (Signed)
Anesthesia Regional Block: Adductor canal block   Pre-Anesthetic Checklist: ,, timeout performed, Correct Patient, Correct Site, Correct Laterality, Correct Procedure, Correct Position, site marked, Risks and benefits discussed,  Surgical consent,  Pre-op evaluation,  At surgeon's request and post-op pain management  Laterality: Left  Prep: Maximum Sterile Barrier Precautions used, chloraprep       Needles:  Injection technique: Single-shot  Needle Type: Echogenic Stimulator Needle     Needle Length: 9cm  Needle Gauge: 22     Additional Needles:   Procedures:,,,, ultrasound used (permanent image in chart),,,,  Narrative:  Start time: 11/24/2020 1:20 PM End time: 11/24/2020 1:26 PM Injection made incrementally with aspirations every 5 mL.  Performed by: Personally  Anesthesiologist: Pervis Hocking, DO  Additional Notes: Monitors applied. No increased pain on injection. No increased resistance to injection. Injection made in 5cc increments. Good needle visualization. Patient tolerated procedure well.

## 2020-11-24 NOTE — Anesthesia Postprocedure Evaluation (Signed)
Anesthesia Post Note  Patient: Kathryn [name]  Procedure(s) Performed: TOTAL KNEE ARTHROPLASTY (Left Knee)     Patient location during evaluation: PACU Anesthesia Type: Regional, MAC and Spinal Level of consciousness: awake and alert and oriented Pain management: pain level controlled Vital Signs Assessment: post-procedure vital signs reviewed and stable Respiratory status: spontaneous breathing, nonlabored ventilation and respiratory function stable Cardiovascular status: blood pressure returned to baseline and stable Postop Assessment: no headache, no backache, spinal receding and no apparent nausea or vomiting Anesthetic complications: no   No complications documented.  Last Vitals:  Vitals:   11/24/20 1324 11/24/20 1329  BP: (!) 178/89   Pulse: 66 68  Resp: 17 14  Temp:    SpO2: 100% 100%    Last Pain:  Vitals:   11/24/20 1136  TempSrc:   PainSc: 0-No pain                 Pervis Hocking

## 2020-11-24 NOTE — Interval H&P Note (Signed)
History and Physical Interval Note:  11/24/2020 12:48 PM  Kathryn [name]  has presented today for surgery, with the diagnosis of Left knee osteoarthritis.  The various methods of treatment have been discussed with the patient and family. After consideration of risks, benefits and other options for treatment, the patient has consented to  Procedure(s) with comments: TOTAL KNEE ARTHROPLASTY (Left) - 43min as a surgical intervention.  The patient's history has been reviewed, patient examined, no change in status, stable for surgery.  I have reviewed the patient's chart and labs.  Questions were answered to the patient's satisfaction.     Pilar Plate D'Arcy Abraha

## 2020-11-24 NOTE — Discharge Instructions (Signed)
 Frank Aluisio, MD Total Joint Specialist [facility] [address] [place], [place] [address] [phone]  TOTAL KNEE REPLACEMENT POSTOPERATIVE DIRECTIONS    Knee Rehabilitation, Guidelines Following Surgery  Results after knee surgery are often greatly improved when you follow the exercise, range of motion and muscle strengthening exercises prescribed by your doctor. Safety measures are also important to protect the knee from further injury. If any of these exercises cause you to have increased pain or swelling in your knee joint, decrease the amount until you are comfortable again and slowly increase them. If you have problems or questions, call your caregiver or physical therapist for advice.   BLOOD CLOT PREVENTION . Take a 325 mg Aspirin two times a day for three weeks following surgery. Then take an 81 mg Aspirin once a day for three weeks. Then discontinue Aspirin. . You may resume your vitamins/supplements upon discharge from the hospital. . Do not take any NSAIDs (Advil, Aleve, Ibuprofen, Meloxicam, etc.) until you have discontinued the 325 mg Aspirin.  HOME CARE INSTRUCTIONS  . Remove items at home which could result in a fall. This includes throw rugs or furniture in walking pathways.  . ICE to the affected knee as much as tolerated. Icing helps control swelling. If the swelling is well controlled you will be more comfortable and rehab easier. Continue to use ice on the knee for pain and swelling from surgery. You may notice swelling that will progress down to the foot and ankle. This is normal after surgery. Elevate the leg when you are not up walking on it.    . Continue to use the breathing machine which will help keep your temperature down. It is common for your temperature to cycle up and down following surgery, especially at night when you are not up moving around and exerting yourself. The breathing machine keeps your lungs expanded and your  temperature down. . Do not place pillow under the operative knee, focus on keeping the knee straight while resting  DIET You may resume your previous home diet once you are discharged from the hospital.  DRESSING / WOUND CARE / SHOWERING . Keep your bulky bandage on for 2 days. On the third post-operative day you may remove the Ace bandage and gauze. There is a waterproof adhesive bandage on your skin which will stay in place until your first follow-up appointment. Once you remove this you will not need to place another bandage . You may begin showering 3 days following surgery, but do not submerge the incision under water.  ACTIVITY For the first 5 days, the key is rest and control of pain and swelling . Do your home exercises twice a day starting on post-operative day 3. On the days you go to physical therapy, just do the home exercises once that day. . You should rest, ice and elevate the leg for 50 minutes out of every hour. Get up and walk/stretch for 10 minutes per hour. After 5 days you can increase your activity slowly as tolerated. . Walk with your walker as instructed. Use the walker until you are comfortable transitioning to a cane. Walk with the cane in the opposite hand of the operative leg. You may discontinue the cane once you are comfortable and walking steadily. . Avoid periods of inactivity such as sitting longer than an hour when not asleep. This helps prevent blood clots.  . You may discontinue the knee immobilizer once you are able to perform a straight   leg raise while lying down. . You may resume a sexual relationship in one month or when given the OK by your doctor.  . You may return to work once you are cleared by your doctor.  . Do not drive a car for 6 weeks or until released by your surgeon.  . Do not drive while taking narcotics.  TED HOSE STOCKINGS Wear the elastic stockings on both legs for three weeks following surgery during the day. You may remove them at night  for sleeping.  WEIGHT BEARING Weight bearing as tolerated with assist device (walker, cane, etc) as directed, use it as long as suggested by your surgeon or therapist, typically at least 4-6 weeks.  POSTOPERATIVE CONSTIPATION PROTOCOL Constipation - defined medically as fewer than three stools per week and severe constipation as less than one stool per week.  One of the most common issues patients have following surgery is constipation.  Even if you have a regular bowel pattern at home, your normal regimen is likely to be disrupted due to multiple reasons following surgery.  Combination of anesthesia, postoperative narcotics, change in appetite and fluid intake all can affect your bowels.  In order to avoid complications following surgery, here are some recommendations in order to help you during your recovery period.  . Colace (docusate) - Pick up an over-the-counter form of Colace or another stool softener and take twice a day as long as you are requiring postoperative pain medications.  Take with a full glass of water daily.  If you experience loose stools or diarrhea, hold the colace until you stool forms back up. If your symptoms do not get better within 1 week or if they get worse, check with your doctor. . Dulcolax (bisacodyl) - Pick up over-the-counter and take as directed by the product packaging as needed to assist with the movement of your bowels.  Take with a full glass of water.  Use this product as needed if not relieved by Colace only.  . MiraLax (polyethylene glycol) - Pick up over-the-counter to have on hand. MiraLax is a solution that will increase the amount of water in your bowels to assist with bowel movements.  Take as directed and can mix with a glass of water, juice, soda, coffee, or tea. Take if you go more than two days without a movement. Do not use MiraLax more than once per day. Call your doctor if you are still constipated or irregular after using this medication for 7 days  in a row.  If you continue to have problems with postoperative constipation, please contact the office for further assistance and recommendations.  If you experience "the worst abdominal pain ever" or develop nausea or vomiting, please contact the office immediatly for further recommendations for treatment.  ITCHING If you experience itching with your medications, try taking only a single pain pill, or even half a pain pill at a time.  You can also use Benadryl over the counter for itching or also to help with sleep.   MEDICATIONS See your medication summary on the "After Visit Summary" that the nursing staff will review with you prior to discharge.  You may have some home medications which will be placed on hold until you complete the course of blood thinner medication.  It is important for you to complete the blood thinner medication as prescribed by your surgeon.  Continue your approved medications as instructed at time of discharge.  PRECAUTIONS . If you experience chest pain or shortness of   breath - call 911 immediately for transfer to the hospital emergency department.  . If you develop a fever greater that 101 F, purulent drainage from wound, increased redness or drainage from wound, foul odor from the wound/dressing, or calf pain - CONTACT YOUR SURGEON.                                                   FOLLOW-UP APPOINTMENTS Make sure you keep all of your appointments after your operation with your surgeon and caregivers. You should call the office at the above phone number and make an appointment for approximately two weeks after the date of your surgery or on the date instructed by your surgeon outlined in the "After Visit Summary".  RANGE OF MOTION AND STRENGTHENING EXERCISES  Rehabilitation of the knee is important following a knee injury or an operation. After just a few days of immobilization, the muscles of the thigh which control the knee become weakened and shrink (atrophy). Knee  exercises are designed to build up the tone and strength of the thigh muscles and to improve knee motion. Often times heat used for twenty to thirty minutes before working out will loosen up your tissues and help with improving the range of motion but do not use heat for the first two weeks following surgery. These exercises can be done on a training (exercise) mat, on the floor, on a table or on a bed. Use what ever works the best and is most comfortable for you Knee exercises include:  . Leg Lifts - While your knee is still immobilized in a splint or cast, you can do straight leg raises. Lift the leg to 60 degrees, hold for 3 sec, and slowly lower the leg. Repeat 10-20 times 2-3 times daily. Perform this exercise against resistance later as your knee gets better.  . Quad and Hamstring Sets - Tighten up the muscle on the front of the thigh (Quad) and hold for 5-10 sec. Repeat this 10-20 times hourly. Hamstring sets are done by pushing the foot backward against an object and holding for 5-10 sec. Repeat as with quad sets.   Leg Slides: Lying on your back, slowly slide your foot toward your buttocks, bending your knee up off the floor (only go as far as is comfortable). Then slowly slide your foot back down until your leg is flat on the floor again.  Angel Wings: Lying on your back spread your legs to the side as far apart as you can without causing discomfort.  A rehabilitation program following serious knee injuries can speed recovery and prevent re-injury in the future due to weakened muscles. Contact your doctor or a physical therapist for more information on knee rehabilitation.   IF YOU ARE TRANSFERRED TO A SKILLED REHAB FACILITY If the patient is transferred to a skilled rehab facility following release from the hospital, a list of the current medications will be sent to the facility for the patient to continue.  When discharged from the skilled rehab facility, please have the facility set up the  patient's Home Health Physical Therapy prior to being released. Also, the skilled facility will be responsible for providing the patient with their medications at time of release from the facility to include their pain medication, the muscle relaxants, and their blood thinner medication. If the patient is still at the   rehab facility at time of the two week follow up appointment, the skilled rehab facility will also need to assist the patient in arranging follow up appointment in our office and any transportation needs.  MAKE SURE YOU:  . Understand these instructions.  . Get help right away if you are not doing well or get worse.   DENTAL ANTIBIOTICS:  In most cases prophylactic antibiotics for Dental procdeures after total joint surgery are not necessary.  Exceptions are as follows:  1. History of prior total joint infection  2. Severely immunocompromised (Organ Transplant, cancer chemotherapy, Rheumatoid biologic meds such as Humera)  3. Poorly controlled diabetes (A1C &gt; 8.0, blood glucose over 200)  If you have one of these conditions, contact your surgeon for an antibiotic prescription, prior to your dental procedure.    Pick up stool softner and laxative for home use following surgery while on pain medications. Do not submerge incision under water. Please use good hand washing techniques while changing dressing each day. May shower starting three days after surgery. Please use a clean towel to pat the incision dry following showers. Continue to use ice for pain and swelling after surgery. Do not use any lotions or creams on the incision until instructed by your surgeon.  

## 2020-11-24 NOTE — Anesthesia Procedure Notes (Signed)
Spinal  Patient location during procedure: OR End time: 11/24/2020 2:29 PM Staffing Performed: anesthesiologist and resident/CRNA  Resident/CRNA: Rush Salce, Sandrika D, CRNA Preanesthetic Checklist Completed: patient identified, IV checked, site marked, risks and benefits discussed, surgical consent, monitors and equipment checked, pre-op evaluation and timeout performed Spinal Block Patient position: sitting Prep: DuraPrep Patient monitoring: heart rate, continuous pulse ox and blood pressure Approach: midline Location: L3-4 Injection technique: single-shot Needle Needle type: Sprotte  Needle gauge: 24 G Needle length: 9 cm Assessment Sensory level: T6 Additional Notes Expiration date of kit checked and confirmed. Patient tolerated procedure well, without complications.

## 2020-11-24 NOTE — Progress Notes (Signed)
Assisted Dr. Beth Finucane with left, ultrasound guided, adductor canal block. Side rails up, monitors on throughout procedure. See vital signs in flow sheet. Tolerated Procedure well. ° °

## 2020-11-24 NOTE — Transfer of Care (Signed)
Immediate Anesthesia Transfer of Care Note  Patient: Kathryn [name]  Procedure(s) Performed: TOTAL KNEE ARTHROPLASTY (Left Knee)  Patient Location: PACU  Anesthesia Type:Regional and Spinal  Level of Consciousness: awake, alert  and oriented  Airway & Oxygen Therapy: Patient Spontanous Breathing and Patient connected to face mask oxygen  Post-op Assessment: Report given to RN and Post -op Vital signs reviewed and stable  Post vital signs: Reviewed and stable  Last Vitals:  Vitals Value Taken Time  BP 126/74 11/24/20 1604  Temp    Pulse 62 11/24/20 1606  Resp    SpO2 100 % 11/24/20 1606  Vitals shown include unvalidated device data.  Last Pain:  Vitals:   11/24/20 1136  TempSrc:   PainSc: 0-No pain      Patients Stated Pain Goal: 2 (40/76/80 8811)  Complications: No complications documented.

## 2020-11-24 NOTE — Op Note (Signed)
OPERATIVE REPORT-TOTAL KNEE ARTHROPLASTY   Pre-operative diagnosis- Osteoarthritis  Left knee(s)  Post-operative diagnosis- Osteoarthritis Left knee(s)  Procedure-  Left  Total Knee Arthroplasty  Surgeon- Dione Plover. Aluisio, MD  Assistant- Theresa Duty, PA-C   Anesthesia-  Adductor canal block and spinal  EBL- 25 ml   Drains None  Tourniquet time-  Total Tourniquet Time Documented: Thigh (Left) - 34 minutes Total: Thigh (Left) - 34 minutes     Complications- None  Condition-PACU - hemodynamically stable.   Brief Clinical Note  Kathryn [name] is a 87 y.o. year old female with end stage OA of her left knee with progressively worsening pain and dysfunction. She has constant pain, with activity and at rest and significant functional deficits with difficulties even with ADLs. She has had extensive non-op management including analgesics, injections of cortisone and viscosupplements, and home exercise program, but remains in significant pain with significant dysfunction. Radiographs show bone on bone arthritis lateral and patellofemoral with valgus deformity. She presents now for left Total Knee Arthroplasty.    Procedure in detail---   The patient is brought into the operating room and positioned supine on the operating table. After successful administration of  Adductor canal block and spinal,   a tourniquet is placed high on the  Left thigh(s) and the lower extremity is prepped and draped in the usual sterile fashion. Time out is performed by the operating team and then the  Left lower extremity is wrapped in Esmarch, knee flexed and the tourniquet inflated to 300 mmHg.       A midline incision is made with a ten blade through the subcutaneous tissue to the level of the extensor mechanism. A fresh blade is used to make a medial parapatellar arthrotomy. Soft tissue over the proximal medial tibia is subperiosteally elevated to the joint line with a knife and into the semimembranosus  bursa with a Cobb elevator. Soft tissue over the proximal lateral tibia is elevated with attention being paid to avoiding the patellar tendon on the tibial tubercle. The patella is everted, knee flexed 90 degrees and the ACL and PCL are removed. Findings are bone on bone lateral and patellofemoral with massive global osteophytes.        The drill is used to create a starting hole in the distal femur and the canal is thoroughly irrigated with sterile saline to remove the fatty contents. The 5 degree Left  valgus alignment guide is placed into the femoral canal and the distal femoral cutting block is pinned to remove 9 mm off the distal femur. Resection is made with an oscillating saw.      The tibia is subluxed forward and the menisci are removed. The extramedullary alignment guide is placed referencing proximally at the medial aspect of the tibial tubercle and distally along the second metatarsal axis and tibial crest. The block is pinned to remove 49mm off the more deficient lateral  side. Resection is made with an oscillating saw. Size 8is the most appropriate size for the tibia and the proximal tibia is prepared with the modular drill and keel punch for that size.      The femoral sizing guide is placed and size 8 is most appropriate. Rotation is marked off the epicondylar axis and confirmed by creating a rectangular flexion gap at 90 degrees. The size 8 cutting block is pinned in this rotation and the anterior, posterior and chamfer cuts are made with the oscillating saw. The intercondylar block is then placed and that  cut is made.      Trial size 8 tibial component, trial size 8 posterior stabilized femur and a 8  mm posterior stabilized rotating platform insert trial is placed. Full extension is achieved with excellent varus/valgus and anterior/posterior balance throughout full range of motion. The patella is everted and thickness measured to be 27  mm. Free hand resection is taken to 15 mm, a 41 template  is placed, lug holes are drilled, trial patella is placed, and it tracks normally. Osteophytes are removed off the posterior femur with the trial in place. All trials are removed and the cut bone surfaces prepared with pulsatile lavage. Cement is mixed and once ready for implantation, the size 8 tibial implant, size  8 posterior stabilized femoral component, and the size 41 patella are cemented in place and the patella is held with the clamp. The trial insert is placed and the knee held in full extension. The Exparel (20 ml mixed with 60 ml saline) is injected into the extensor mechanism, posterior capsule, medial and lateral gutters and subcutaneous tissues.  All extruded cement is removed and once the cement is hard the permanent 8 mm posterior stabilized rotating platform insert is placed into the tibial tray.      The wound is copiously irrigated with saline solution and the extensor mechanism closed with # 0 Stratofix suture. The tourniquet is released for a total tourniquet time of 34  minutes. Flexion against gravity is 130 degrees and the patella tracks normally. Subcutaneous tissue is closed with 2.0 vicryl and subcuticular with running 4.0 Monocryl. The incision is cleaned and dried and steri-strips and a bulky sterile dressing are applied. The limb is placed into a knee immobilizer and the patient is awakened and transported to recovery in stable condition.      Please note that a surgical assistant was a medical necessity for this procedure in order to perform it in a safe and expeditious manner. Surgical assistant was necessary to retract the ligaments and vital neurovascular structures to prevent injury to them and also necessary for proper positioning of the limb to allow for anatomic placement of the prosthesis.   Dione Plover Aluisio, MD    11/24/2020, 3:35 PM

## 2020-11-24 NOTE — Progress Notes (Signed)
Orthopedic Tech Progress Note Patient Details:  Kathryn [name] [birth year] [id number]  Patient ID: [name], female   DOB: [birth year], 87 y.o.   MRN: [id number]   [name] 11/24/2020, 4:22 PM cpm placed on pt @1620  in pacu

## 2020-11-25 ENCOUNTER — Encounter (HOSPITAL_COMMUNITY): Payer: Self-pay | Admitting: Orthopedic Surgery

## 2020-11-25 DIAGNOSIS — I4819 Other persistent atrial fibrillation: Secondary | ICD-10-CM | POA: Diagnosis not present

## 2020-11-25 DIAGNOSIS — Z92241 Personal history of systemic steroid therapy: Secondary | ICD-10-CM | POA: Diagnosis not present

## 2020-11-25 DIAGNOSIS — M1712 Unilateral primary osteoarthritis, left knee: Secondary | ICD-10-CM | POA: Diagnosis not present

## 2020-11-25 DIAGNOSIS — D649 Anemia, unspecified: Secondary | ICD-10-CM | POA: Diagnosis not present

## 2020-11-25 DIAGNOSIS — Z8679 Personal history of other diseases of the circulatory system: Secondary | ICD-10-CM | POA: Diagnosis not present

## 2020-11-25 LAB — BASIC METABOLIC PANEL
Anion gap: 7 (ref 5–15)
BUN: 30 mg/dL — ABNORMAL HIGH (ref 8–23)
CO2: 25 mmol/L (ref 22–32)
Calcium: 8.7 mg/dL — ABNORMAL LOW (ref 8.9–10.3)
Chloride: 105 mmol/L (ref 98–111)
Creatinine, Ser: 0.79 mg/dL (ref 0.44–1.00)
GFR, Estimated: >60 mL/min (ref >60)
Glucose, Bld: 183 mg/dL — ABNORMAL HIGH (ref 70–99)
Potassium: 4 mmol/L (ref 3.5–5.1)
Sodium: 137 mmol/L (ref 135–145)

## 2020-11-25 LAB — CBC
HCT: 31.8 % — ABNORMAL LOW (ref 36.0–46.0)
Hemoglobin: 10.5 g/dL — ABNORMAL LOW (ref 12.0–15.0)
MCH: 33.5 pg (ref 26.0–34.0)
MCHC: 33 g/dL (ref 30.0–36.0)
MCV: 101.6 fL — ABNORMAL HIGH (ref 80.0–100.0)
Platelets: 202 10*3/uL (ref 150–400)
RBC: 3.13 MIL/uL — ABNORMAL LOW (ref 3.87–5.11)
RDW: 13 % (ref 11.5–15.5)
WBC: 10.4 10*3/uL (ref 4.0–10.5)
nRBC: 0 % (ref 0.0–0.2)

## 2020-11-25 MED ORDER — METHOCARBAMOL 500 MG PO TABS: 500.0000 mg | ORAL_TABLET | Freq: Four times a day (QID) | ORAL | 0 refills | Status: DC | PRN

## 2020-11-25 MED ORDER — ASPIRIN 325 MG PO TBEC: 325.0000 mg | DELAYED_RELEASE_TABLET | Freq: Two times a day (BID) | ORAL | 0 refills | Status: AC

## 2020-11-25 MED ORDER — TRAMADOL HCL 50 MG PO TABS: 50.0000 mg | ORAL_TABLET | Freq: Four times a day (QID) | ORAL | 0 refills | Status: DC | PRN

## 2020-11-25 MED ORDER — OXYCODONE HCL 5 MG PO TABS: 5.0000 mg | ORAL_TABLET | Freq: Four times a day (QID) | ORAL | 0 refills | Status: DC | PRN

## 2020-11-25 NOTE — Evaluation (Signed)
Physical Therapy Evaluation Patient Details Name: Kathryn [name] MRN: [id number] DOB: 1931/03/22 Today's Date: 11/25/2020   History of Present Illness  Pt is an 87 year old female s/p left TKA  Clinical Impression  Pt is s/p TKA resulting in the deficits listed below (see PT Problem List).  Pt will benefit from skilled PT to increase their independence and safety with mobility to allow discharge to the venue listed below.  Pt reports ambulating over a mile every day at baseline.  Pt ambulated in hallway and performed a few LE exercises.  Pt plans to f/u with OPPT upon d/c.  Anticipate d/c home today after afternoon session.       Follow Up Recommendations Follow surgeon's recommendation for DC plan and follow-up therapies    Equipment Recommendations  None recommended by PT    Recommendations for Other Services       Precautions / Restrictions Precautions Precautions: Fall;Knee Precaution Comments: able to perform SLR Restrictions Weight Bearing Restrictions: No      Mobility  Bed Mobility Overal bed mobility: Needs Assistance Bed Mobility: Supine to Sit     Supine to sit: Supervision;HOB elevated          Transfers Overall transfer level: Needs assistance Equipment used: Rolling walker (2 wheeled) Transfers: Sit to/from Stand Sit to Stand: Min guard         General transfer comment: verbal cues for UE and LE positioning  Ambulation/Gait Ambulation/Gait assistance: Min guard Gait Distance (Feet): 100 Feet Assistive device: Rolling walker (2 wheeled) Gait Pattern/deviations: Step-to pattern;Step-through pattern;Antalgic;Decreased stance time - left     General Gait Details: verbal cues for sequence, step length, posture  Stairs            Wheelchair Mobility    Modified Rankin (Stroke Patients Only)       Balance                                             Pertinent Vitals/Pain Pain Assessment: 0-10 Pain Score: 7  Pain  Location: left knee Pain Descriptors / Indicators: Aching;Sore Pain Intervention(s): Repositioned;Monitored during session    Yell expects to be discharged to:: Private residence Living Arrangements: Spouse/significant other Available Help at Discharge: Family Type of Home: House Home Access: Stairs to enter Entrance Stairs-Rails: None Entrance Stairs-Number of Steps: 1 Home Layout: One level Home Equipment: Environmental consultant - 2 wheels;Bedside commode      Prior Function Level of Independence: Independent               Hand Dominance        Extremity/Trunk Assessment        Lower Extremity Assessment Lower Extremity Assessment: LLE deficits/detail LLE Deficits / Details: able to perform SLR, foot with increased inversion which pt reports as baseline; AAROM left knee -2-90*       Communication   Communication: No difficulties  Cognition Arousal/Alertness: Awake/alert Behavior During Therapy: WFL for tasks assessed/performed Overall Cognitive Status: Within Functional Limits for tasks assessed                                        General Comments      Exercises Total Joint Exercises Ankle Circles/Pumps: Both;AROM;20 reps Quad Sets: AROM;Left;10 reps Straight Leg Raises:  AROM;Left;10 reps Long Arc Quad: AROM;Left;10 reps;Seated Knee Flexion: Seated;AAROM;Left;10 reps   Assessment/Plan    PT Assessment Patient needs continued PT services  PT Problem List Decreased strength;Decreased mobility;Decreased activity tolerance;Decreased knowledge of use of DME;Decreased range of motion;Pain       PT Treatment Interventions Stair training;Gait training;Balance training;DME instruction;Therapeutic exercise;Functional mobility training;Therapeutic activities;Patient/family education    PT Goals (Current goals can be found in the Care Plan section)  Acute Rehab PT Goals PT Goal Formulation: With patient Time For Goal Achievement:  11/29/20 Potential to Achieve Goals: Good    Frequency 7X/week   Barriers to discharge        Co-evaluation               AM-PAC PT "6 Clicks" Mobility  Outcome Measure Help needed turning from your back to your side while in a flat bed without using bedrails?: A Little Help needed moving from lying on your back to sitting on the side of a flat bed without using bedrails?: A Little Help needed moving to and from a bed to a chair (including a wheelchair)?: A Little Help needed standing up from a chair using your arms (e.g., wheelchair or bedside chair)?: A Little Help needed to walk in hospital room?: A Little Help needed climbing 3-5 steps with a railing? : A Little 6 Click Score: 18    End of Session Equipment Utilized During Treatment: Gait belt Activity Tolerance: Patient tolerated treatment well Patient left: in chair;with call bell/phone within reach;with chair alarm set   PT Visit Diagnosis: Other abnormalities of gait and mobility (R26.89)    Time: 9826-4158 PT Time Calculation (min) (ACUTE ONLY): 22 min   Charges:   PT Evaluation $PT Eval Low Complexity: 1 Low        Kati PT, DPT Acute Rehabilitation Services Pager: [phone] Office: [phone]  York Ram E 11/25/2020, 11:51 AM

## 2020-11-25 NOTE — TOC Progression Note (Signed)
Transition of Care [facility]) - Progression Note    Patient Details  Name: Kathryn [name] MRN: [id number] Date of Birth: May 13, 1931  Transition of Care [facility]) CM/SW Contact  Joaquin Courts, RN Phone Number: 11/25/2020, 9:27 AM  Clinical Narrative:    Plan to dc home with OPPT, patient has all dme.   Expected Discharge Plan: Home/Self Care Barriers to Discharge: No Barriers Identified  Expected Discharge Plan and Services Expected Discharge Plan: Home/Self Care         Expected Discharge Date: 11/25/20               DME Arranged: N/A                     Social Determinants of Health (SDOH) Interventions    Readmission Risk Interventions No flowsheet data found.

## 2020-11-25 NOTE — Progress Notes (Signed)
Physical Therapy Treatment Patient Details Name: Kathryn [name] MRN: [id number] DOB: [birth year] Today's Date: 11/25/2020    History of Present Illness Pt is an 87 year old female s/p left TKA    PT Comments    Pt premedicated however reports 6/10 pain during therapy.  Pt requiring min assist at times for steadying likely due to pain.  Pt reports her friend is picking her up and will stay with her overnight upon d/c.  Pt ambulated in hallway and practiced one step.  Pt eager for d/c home today.  Provided HEP handout.   Follow Up Recommendations  Follow surgeon's recommendation for DC plan and follow-up therapies     Equipment Recommendations  None recommended by PT    Recommendations for Other Services       Precautions / Restrictions Precautions Precautions: Fall;Knee Precaution Comments: able to perform SLR Restrictions Weight Bearing Restrictions: No    Mobility  Bed Mobility Overal bed mobility: Needs Assistance Bed Mobility: Supine to Sit     Supine to sit: Supervision;HOB elevated     General bed mobility comments: pt up in recliner    Transfers Overall transfer level: Needs assistance Equipment used: Rolling walker (2 wheeled) Transfers: Sit to/from Stand Sit to Stand: Min guard         General transfer comment: verbal cues for UE and LE positioning, increased time and effort due to pain this afternoon  Ambulation/Gait Ambulation/Gait assistance: Min guard Gait Distance (Feet): 140 Feet Assistive device: Rolling walker (2 wheeled) Gait Pattern/deviations: Step-to pattern;Step-through pattern;Antalgic;Decreased stance time - left     General Gait Details: verbal cues for sequence, step length, posture; cues to slow pace for safety   Stairs Stairs: Yes Stairs assistance: Min assist Stair Management: Step to pattern;Backwards;With walker Number of Stairs: 1 General stair comments: pt recalls technique from previous surgeries, required steady  assist; performe twice; agreeable to have person assist at home for safety   Wheelchair Mobility    Modified Rankin (Stroke Patients Only)       Balance                                            Cognition Arousal/Alertness: Awake/alert Behavior During Therapy: WFL for tasks assessed/performed Overall Cognitive Status: Within Functional Limits for tasks assessed                                        Exercises Total Joint Exercises Ankle Circles/Pumps: Both;AROM;20 reps Quad Sets: AROM;Left;10 reps Straight Leg Raises: AROM;Left;10 reps Long Arc Quad: AROM;Left;10 reps;Seated Knee Flexion: Seated;AAROM;Left;10 reps    General Comments        Pertinent Vitals/Pain Pain Assessment: 0-10 Pain Score: 7  Pain Location: left knee Pain Descriptors / Indicators: Aching;Sore Pain Intervention(s): Monitored during session;Premedicated before session;Repositioned;Ice applied    Home Living Family/patient expects to be discharged to:: Private residence Living Arrangements: Spouse/significant other Available Help at Discharge: Family Type of Home: House Home Access: Stairs to enter Entrance Stairs-Rails: None Home Layout: One level Home Equipment: Environmental consultant - 2 wheels;Bedside commode      Prior Function Level of Independence: Independent          PT Goals (current goals can now be found in the care plan section) Acute Rehab PT Goals PT  Goal Formulation: With patient Time For Goal Achievement: 11/29/20 Potential to Achieve Goals: Good Progress towards PT goals: Progressing toward goals    Frequency    7X/week      PT Plan Current plan remains appropriate    Co-evaluation              AM-PAC PT "6 Clicks" Mobility   Outcome Measure  Help needed turning from your back to your side while in a flat bed without using bedrails?: A Little Help needed moving from lying on your back to sitting on the side of a flat bed  without using bedrails?: A Little Help needed moving to and from a bed to a chair (including a wheelchair)?: A Little Help needed standing up from a chair using your arms (e.g., wheelchair or bedside chair)?: A Little Help needed to walk in hospital room?: A Little Help needed climbing 3-5 steps with a railing? : A Little 6 Click Score: 18    End of Session Equipment Utilized During Treatment: Gait belt Activity Tolerance: Patient tolerated treatment well Patient left: in chair;with call bell/phone within reach;with chair alarm set   PT Visit Diagnosis: Other abnormalities of gait and mobility (R26.89)     Time: 1353-1410 PT Time Calculation (min) (ACUTE ONLY): 17 min  Charges:  $Gait Training: 8-22 mins                     Arlyce Dice, DPT Acute Rehabilitation Services Pager: [phone] Office: [phone]   York Ram E 11/25/2020, 3:45 PM

## 2020-11-25 NOTE — Progress Notes (Signed)
   Subjective: 1 Day Post-Op Procedure(s) (LRB): TOTAL KNEE ARTHROPLASTY (Left) Patient reports pain as mild.   Patient seen in rounds by Dr. Wynelle Link. Patient is well, and has had no acute complaints or problems other than pain in the left knee. No issues overnight, denies chest pain, SOB, or calf pain. Foley catheter removed this AM. We will begin therapy today.   Objective: Vital signs in last 24 hours: Temp:  [97.5 F (36.4 C)-98.1 F (36.7 C)] 98.1 F (36.7 C) (03/01 0516) Pulse Rate:  [31-95] 95 (03/01 0516) Resp:  [10-18] 16 (03/01 0516) BP: (101-178)/(62-96) 123/71 (03/01 0516) SpO2:  [85 %-100 %] 97 % (03/01 0516) Weight:  [60.5 kg] 60.5 kg (02/28 2020)  Intake/Output from previous day:  Intake/Output Summary (Last 24 hours) at 11/25/2020 0749 Last data filed at 11/25/2020 0553 Gross per 24 hour  Intake 1990 ml  Output 1575 ml  Net 415 ml     Intake/Output this shift: No intake/output data recorded.  Labs: Recent Labs    11/25/20 0320  HGB 10.5*   Recent Labs    11/25/20 0320  WBC 10.4  RBC 3.13*  HCT 31.8*  PLT 202   Recent Labs    11/25/20 0320  NA 137  K 4.0  CL 105  CO2 25  BUN 30*  CREATININE 0.79  GLUCOSE 183*  CALCIUM 8.7*   No results for input(s): LABPT, INR in the last 72 hours.  Exam: General - Patient is Alert and Oriented Extremity - Neurologically intact Neurovascular intact Sensation intact distally Dorsiflexion/Plantar flexion intact Dressing - dressing C/D/I Motor Function - intact, moving foot and toes well on exam.   Past Medical History:  Diagnosis Date  . Allergy   . Arthritis   . Colon polyps   . History of blood transfusion    with previous surgeries  . Hyperlipidemia   . Irregular heart rate   . PONV (postoperative nausea and vomiting)    hx of pt. states she thinks was from pain med.    Assessment/Plan: 1 Day Post-Op Procedure(s) (LRB): TOTAL KNEE ARTHROPLASTY (Left) Principal Problem:   OA  (osteoarthritis) of knee Active Problems:   Osteoarthritis of left knee  Estimated body mass index is 22.89 kg/m as calculated from the following:   Height as of this encounter: 5\' 4"  (1.626 m).   Weight as of this encounter: 60.5 kg. Advance diet Up with therapy D/C IV fluids   Patient's anticipated LOS is less than 2 midnights, meeting these requirements: - Lives within 1 hour of care - Has a competent adult at home to recover with post-op recover - NO history of  - Chronic pain requiring opiods  - Diabetes  - Coronary Artery Disease  - Heart failure  - Heart attack  - Stroke  - DVT/VTE  - Cardiac arrhythmia  - Respiratory Failure/COPD  - Renal failure  - Anemia  - Advanced Liver disease  DVT Prophylaxis - Aspirin Weight bearing as tolerated. Begin therapy.  Plan is to go Home after hospital stay. Plan for discharge later today if progresses with therapy and is meeting her goals. Scheduled for OPPT at [facility] Follow-up in the office in 2 weeks  The PDMP database was reviewed today prior to any opioid medications being prescribed to this patient.  Theresa Duty, PA-C Orthopedic Surgery [phone] 11/25/2020, 7:49 AM

## 2020-11-26 NOTE — Discharge Summary (Signed)
Physician Discharge Summary   Patient ID: Kathryn [name] MRN: [id number] DOB/AGE: 02/21/1931 87 y.o.  Admit date: 11/24/2020 Discharge date: 11/25/2020  Primary Diagnosis: Osteoarthritis, left knee   Admission Diagnoses:  Past Medical History:  Diagnosis Date  . Allergy   . Arthritis   . Colon polyps   . History of blood transfusion    with previous surgeries  . Hyperlipidemia   . Irregular heart rate   . PONV (postoperative nausea and vomiting)    hx of pt. states she thinks was from pain med.   Discharge Diagnoses:   Principal Problem:   OA (osteoarthritis) of knee Active Problems:   Osteoarthritis of left knee  Estimated body mass index is 22.89 kg/m as calculated from the following:   Height as of this encounter: 5\' 4"  (1.626 m).   Weight as of this encounter: 60.5 kg.  Procedure:  Procedure(s) (LRB): TOTAL KNEE ARTHROPLASTY (Left)   Consults: None  HPI: Kathryn [name] is a 87 y.o. year old female with end stage OA of her left knee with progressively worsening pain and dysfunction. She has constant pain, with activity and at rest and significant functional deficits with difficulties even with ADLs. She has had extensive non-op management including analgesics, injections of cortisone and viscosupplements, and home exercise program, but remains in significant pain with significant dysfunction. Radiographs show bone on bone arthritis lateral and patellofemoral with valgus deformity. She presents now for left Total Knee Arthroplasty.   Laboratory Data: Admission on 11/24/2020, Discharged on 11/25/2020  Component Date Value Ref Range Status  . WBC 11/25/2020 10.4  4.0 - 10.5 K/uL Final  . RBC 11/25/2020 3.13* 3.87 - 5.11 MIL/uL Final  . Hemoglobin 11/25/2020 10.5* 12.0 - 15.0 g/dL Final  . HCT 11/25/2020 31.8* 36.0 - 46.0 % Final  . MCV 11/25/2020 101.6* 80.0 - 100.0 fL Final  . MCH 11/25/2020 33.5  26.0 - 34.0 pg Final  . MCHC 11/25/2020 33.0  30.0 - 36.0 g/dL Final  .  RDW 11/25/2020 13.0  11.5 - 15.5 % Final  . Platelets 11/25/2020 202  150 - 400 K/uL Final  . nRBC 11/25/2020 0.0  0.0 - 0.2 % Final   Performed at [facility], [place] [address] [place], [place] [address]  . Sodium 11/25/2020 137  135 - 145 mmol/L Final  . Potassium 11/25/2020 4.0  3.5 - 5.1 mmol/L Final  . Chloride 11/25/2020 105  98 - 111 mmol/L Final  . CO2 11/25/2020 25  22 - 32 mmol/L Final  . Glucose, Bld 11/25/2020 183* 70 - 99 mg/dL Final   Glucose reference range applies only to samples taken after fasting for at least 8 hours.  . BUN 11/25/2020 30* 8 - 23 mg/dL Final  . Creatinine, Ser 11/25/2020 0.79  0.44 - 1.00 mg/dL Final  . Calcium 11/25/2020 8.7* 8.9 - 10.3 mg/dL Final  . GFR, Estimated 11/25/2020 >60  >60 mL/min Final   Comment: (NOTE) Calculated using the CKD-EPI Creatinine Equation (2021)   . Anion gap 11/25/2020 7  5 - 15 Final   Performed at [facility], [place] [address] [place], [place] [address]  Hospital Outpatient Visit on 11/20/2020  Component Date Value Ref Range Status  . SARS Coronavirus 2 11/20/2020 NEGATIVE  NEGATIVE Final   Comment: (NOTE) SARS-CoV-2 target nucleic acids are NOT DETECTED.  The SARS-CoV-2 RNA is generally detectable in upper and lower respiratory specimens during the acute phase of infection. Negative results do not  preclude SARS-CoV-2 infection, do not rule out co-infections with other pathogens, and should not be used as the sole basis for treatment or other patient management decisions. Negative results must be combined with clinical observations, patient history, and epidemiological information. The expected result is Negative.  Fact Sheet for Patients: SugarRoll.be  Fact Sheet for Healthcare Providers: https://www.woods-mathews.com/  This test is not yet approved or cleared by the [place] FDA and  has been authorized for detection and/or  diagnosis of SARS-CoV-2 by FDA under an Emergency Use Authorization (EUA). This EUA will remain  in effect (meaning this test can be used) for the duration of the COVID-19 declaration under Se                          ction 564(b)(1) of the Act, 21 U.S.C. section 360bbb-3(b)(1), unless the authorization is terminated or revoked sooner.  Performed at [facility], [place] [address], [place], [place] [address]   Hospital Outpatient Visit on 11/14/2020  Component Date Value Ref Range Status  . MRSA, PCR 11/14/2020 NEGATIVE  NEGATIVE Final  . Staphylococcus aureus 11/14/2020 NEGATIVE  NEGATIVE Final   Comment: (NOTE) The Xpert SA Assay (FDA approved for NASAL specimens in patients 56 years of age and older), is one component of a comprehensive surveillance program. It is not intended to diagnose infection nor to guide or monitor treatment. Performed at [facility], [place] [address], [place], [place] [address]   . WBC 11/14/2020 5.8  4.0 - 10.5 K/uL Final  . RBC 11/14/2020 3.52* 3.87 - 5.11 MIL/uL Final  . Hemoglobin 11/14/2020 11.8* 12.0 - 15.0 g/dL Final  . HCT 11/14/2020 36.2  36.0 - 46.0 % Final  . MCV 11/14/2020 102.8* 80.0 - 100.0 fL Final  . MCH 11/14/2020 33.5  26.0 - 34.0 pg Final  . MCHC 11/14/2020 32.6  30.0 - 36.0 g/dL Final  . RDW 11/14/2020 13.4  11.5 - 15.5 % Final  . Platelets 11/14/2020 249  150 - 400 K/uL Final  . nRBC 11/14/2020 0.0  0.0 - 0.2 % Final   Performed at [facility], [place] [address], [place], [place] [address]  . Sodium 11/14/2020 140  135 - 145 mmol/L Final  . Potassium 11/14/2020 4.5  3.5 - 5.1 mmol/L Final  . Chloride 11/14/2020 105  98 - 111 mmol/L Final  . CO2 11/14/2020 23  22 - 32 mmol/L Final  . Glucose, Bld 11/14/2020 89  70 - 99 mg/dL Final   Glucose reference range applies only to samples taken after fasting for at least 8 hours.  . BUN 11/14/2020 29* 8 - 23 mg/dL Final  . Creatinine, Ser 11/14/2020  0.82  0.44 - 1.00 mg/dL Final  . Calcium 11/14/2020 9.6  8.9 - 10.3 mg/dL Final  . Total Protein 11/14/2020 7.1  6.5 - 8.1 g/dL Final  . Albumin 11/14/2020 4.1  3.5 - 5.0 g/dL Final  . AST 11/14/2020 28  15 - 41 U/L Final  . ALT 11/14/2020 19  0 - 44 U/L Final  . Alkaline Phosphatase 11/14/2020 66  38 - 126 U/L Final  . Total Bilirubin 11/14/2020 0.9  0.3 - 1.2 mg/dL Final  . GFR, Estimated 11/14/2020 >60  >60 mL/min Final   Comment: (NOTE) Calculated using the CKD-EPI Creatinine Equation (2021)   . Anion gap 11/14/2020 12  5 - 15 Final   Performed at [facility], [place]  [address], [place], [place] [address]  . Prothrombin Time 11/14/2020 12.0  11.4 - 15.2 seconds Final  . INR 11/14/2020 0.9  0.8 - 1.2 Final   Comment: (NOTE) INR goal varies based on device and disease states. Performed at [facility], [place] [address] [place], [place] [address]   . aPTT 11/14/2020 32  24 - 36 seconds Final   Performed at [facility], [place] [address], [place], [place] [address]  . ABO/RH(D) 11/14/2020 O POS   Final  . Antibody Screen 11/14/2020 NEG   Final  . Sample Expiration 11/14/2020 11/27/2020,2359   Final  . Extend sample reason 11/14/2020    Final                   Value:NO TRANSFUSIONS OR PREGNANCY IN THE PAST 3 MONTHS Performed at [facility], [place] [address] [place], [place] [address]   Lab on 10/28/2020  Component Date Value Ref Range Status  . Vitamin B-12 10/28/2020 >1526* 211 - 911 pg/mL Final  . Cholesterol 10/28/2020 202* 0 - 200 mg/dL Final   ATP III Classification       Desirable:  < 200 mg/dL               Borderline High:  200 - 239 mg/dL          High:  > = 240 mg/dL  . Triglycerides 10/28/2020 96.0  0.0 - 149.0 mg/dL Final   Normal:  <150 mg/dLBorderline High:  150 - 199 mg/dL  . HDL 10/28/2020 52.90  >39.00 mg/dL Final  . VLDL 10/28/2020 19.2  0.0 - 40.0 mg/dL Final  . LDL Cholesterol 10/28/2020  130* 0 - 99 mg/dL Final  . Total CHOL/HDL Ratio 10/28/2020 4   Final                  Men          Women1/2 Average Risk     3.4          3.3Average Risk          5.0          4.42X Average Risk          9.6          7.13X Average Risk          15.0          11.0                      . NonHDL 10/28/2020 149.53   Final   NOTE:  Non-HDL goal should be 30 mg/dL higher than patient's LDL goal (i.e. LDL goal of < 70 mg/dL, would have non-HDL goal of < 100 mg/dL)  . Sodium 10/28/2020 141  135 - 145 mEq/L Final  . Potassium 10/28/2020 4.5  3.5 - 5.1 mEq/L Final  . Chloride 10/28/2020 103  96 - 112 mEq/L Final  . CO2 10/28/2020 26  19 - 32 mEq/L Final  . Glucose, Bld 10/28/2020 87  70 - 99 mg/dL Final  . BUN 10/28/2020 30* 6 - 23 mg/dL Final  . Creatinine, Ser 10/28/2020 0.79  0.40 - 1.20 mg/dL Final  . Total Bilirubin 10/28/2020 0.6  0.2 - 1.2 mg/dL Final  . Alkaline Phosphatase 10/28/2020 70  39 - 117 U/L Final  . AST 10/28/2020 24  0 - 37 U/L Final  . ALT 10/28/2020 15  0 - 35 U/L Final  .  Total Protein 10/28/2020 7.2  6.0 - 8.3 g/dL Final  . Albumin 10/28/2020 4.0  3.5 - 5.2 g/dL Final  . GFR 10/28/2020 67.34  >60.00 mL/min Final   Calculated using the CKD-EPI Creatinine Equation (2021)  . Calcium 10/28/2020 9.9  8.4 - 10.5 mg/dL Final  . WBC 10/28/2020 5.4  4.0 - 10.5 K/uL Final  . RBC 10/28/2020 3.67* 3.87 - 5.11 Mil/uL Final  . Hemoglobin 10/28/2020 12.3  12.0 - 15.0 g/dL Final  . HCT 10/28/2020 36.7  36.0 - 46.0 % Final  . MCV 10/28/2020 100.1* 78.0 - 100.0 fl Final  . MCHC 10/28/2020 33.5  30.0 - 36.0 g/dL Final  . RDW 10/28/2020 13.8  11.5 - 15.5 % Final  . Platelets 10/28/2020 254.0  150.0 - 400.0 K/uL Final  . Neutrophils Relative % 10/28/2020 63.4  43.0 - 77.0 % Final  . Lymphocytes Relative 10/28/2020 14.0  12.0 - 46.0 % Final  . Monocytes Relative 10/28/2020 11.0  3.0 - 12.0 % Final  . Eosinophils Relative 10/28/2020 10.6* 0.0 - 5.0 % Final  . Basophils Relative 10/28/2020 1.0   0.0 - 3.0 % Final  . Neutro Abs 10/28/2020 3.4  1.4 - 7.7 K/uL Final  . Lymphs Abs 10/28/2020 0.8  0.7 - 4.0 K/uL Final  . Monocytes Absolute 10/28/2020 0.6  0.1 - 1.0 K/uL Final  . Eosinophils Absolute 10/28/2020 0.6  0.0 - 0.7 K/uL Final  . Basophils Absolute 10/28/2020 0.1  0.0 - 0.1 K/uL Final  Office Visit on 10/02/2020  Component Date Value Ref Range Status  . Sodium 10/02/2020 137  135 - 145 mEq/L Final  . Potassium 10/02/2020 4.5  3.5 - 5.1 mEq/L Final  . Chloride 10/02/2020 104  96 - 112 mEq/L Final  . CO2 10/02/2020 24  19 - 32 mEq/L Final  . Glucose, Bld 10/02/2020 80  70 - 99 mg/dL Final  . BUN 10/02/2020 33* 6 - 23 mg/dL Final  . Creatinine, Ser 10/02/2020 0.90  0.40 - 1.20 mg/dL Final  . Total Bilirubin 10/02/2020 0.5  0.2 - 1.2 mg/dL Final  . Alkaline Phosphatase 10/02/2020 71  39 - 117 U/L Final  . AST 10/02/2020 26  0 - 37 U/L Final  . ALT 10/02/2020 16  0 - 35 U/L Final  . Total Protein 10/02/2020 7.4  6.0 - 8.3 g/dL Final  . Albumin 10/02/2020 4.4  3.5 - 5.2 g/dL Final  . GFR 10/02/2020 57.62* >60.00 mL/min Final   Calculated using the CKD-EPI Creatinine Equation (2021)  . Calcium 10/02/2020 9.7  8.4 - 10.5 mg/dL Final  . WBC 10/02/2020 7.1  4.0 - 10.5 K/uL Final  . RBC 10/02/2020 3.55* 3.87 - 5.11 Mil/uL Final  . Hemoglobin 10/02/2020 12.3  12.0 - 15.0 g/dL Final  . HCT 10/02/2020 35.7* 36.0 - 46.0 % Final  . MCV 10/02/2020 100.6* 78.0 - 100.0 fl Final  . MCHC 10/02/2020 34.3  30.0 - 36.0 g/dL Final  . RDW 10/02/2020 14.4  11.5 - 15.5 % Final  . Platelets 10/02/2020 255.0  150.0 - 400.0 K/uL Final  . Neutrophils Relative % 10/02/2020 70.9  43.0 - 77.0 % Final  . Lymphocytes Relative 10/02/2020 12.4  12.0 - 46.0 % Final  . Monocytes Relative 10/02/2020 10.4  3.0 - 12.0 % Final  . Eosinophils Relative 10/02/2020 5.2* 0.0 - 5.0 % Final  . Basophils Relative 10/02/2020 1.1  0.0 - 3.0 % Final  . Neutro Abs 10/02/2020 5.0  1.4 - 7.7 K/uL  Final  . Lymphs Abs  10/02/2020 0.9  0.7 - 4.0 K/uL Final  . Monocytes Absolute 10/02/2020 0.7  0.1 - 1.0 K/uL Final  . Eosinophils Absolute 10/02/2020 0.4  0.0 - 0.7 K/uL Final  . Basophils Absolute 10/02/2020 0.1  0.0 - 0.1 K/uL Final  . INR 10/02/2020 1.0  0.8 - 1.0 ratio Final  . Prothrombin Time 10/02/2020 11.1  9.6 - 13.1 sec Final     X-Rays:No results found.  EKG: Orders placed or performed in visit on 10/02/20  . EKG 12-Lead     Hospital Course: Kathryn [name] is a 87 y.o. who was admitted to [facility]. They were brought to the operating room on 11/24/2020 and underwent Procedure(s): TOTAL KNEE ARTHROPLASTY.  Patient tolerated the procedure well and was later transferred to the recovery room and then to the orthopaedic floor for postoperative care. They were given PO and IV analgesics for pain control following their surgery. They were given 24 hours of postoperative antibiotics of  Anti-infectives (From admission, onward)   Start     Dose/Rate Route Frequency Ordered Stop   11/24/20 2000  ceFAZolin (ANCEF) IVPB 2g/100 mL premix        2 g 200 mL/hr over 30 Minutes Intravenous Every 6 hours 11/24/20 1715 11/25/20 0348   11/24/20 1115  ceFAZolin (ANCEF) IVPB 2g/100 mL premix        2 g 200 mL/hr over 30 Minutes Intravenous On call to O.R. 11/24/20 1109 11/24/20 1429     and started on DVT prophylaxis in the form of Aspirin.   PT and OT were ordered for total joint protocol. Discharge planning consulted to help with postop disposition and equipment needs.  Patient had a good night on the evening of surgery. They started to get up OOB with therapy on POD #1. Pt was seen during rounds and was ready to go home pending progress with therapy. She worked with therapy on POD #1 and was meeting her goals. Pt was discharged to home later that day in stable condition.  Diet: Regular diet Activity: WBAT Follow-up: in 2 weeks Disposition: Home with OPPT Discharged Condition: stable   Discharge  Instructions    Call MD / Call 911   Complete by: As directed    If you experience chest pain or shortness of breath, CALL 911 and be transported to the hospital emergency room.  If you develope a fever above 101 F, pus (white drainage) or increased drainage or redness at the wound, or calf pain, call your surgeon's office.   Change dressing   Complete by: As directed    You may remove the bulky bandage (ACE wrap and gauze) two days after surgery. You will have an adhesive waterproof bandage underneath. Leave this in place until your first follow-up appointment.   Constipation Prevention   Complete by: As directed    Drink plenty of fluids.  Prune juice may be helpful.  You may use a stool softener, such as Colace (over the counter) 100 mg twice a day.  Use MiraLax (over the counter) for constipation as needed.   Diet - low sodium heart healthy   Complete by: As directed    Do not put a pillow under the knee. Place it under the heel.   Complete by: As directed    Driving restrictions   Complete by: As directed    No driving for two weeks   TED hose   Complete by: As directed  Use stockings (TED hose) for three weeks on both leg(s).  You may remove them at night for sleeping.   Weight bearing as tolerated   Complete by: As directed      Allergies as of 11/25/2020      Reactions   Macrobid [nitrofurantoin Monohyd Macro]    Made pt sick to per stomach and shaky      Medication List    TAKE these medications   aspirin 325 MG EC tablet Take 1 tablet (325 mg total) by mouth 2 (two) times daily for 20 days. Then take one 81 mg aspirin once a day for three weeks. Then discontinue aspirin.   CALCIUM 1200 PO Take 2 tablets by mouth daily.   cetirizine 10 MG tablet Commonly known as: ZYRTEC TAKE 1 TABLET DAILY   fluticasone 50 MCG/ACT nasal spray Commonly known as: FLONASE Place 1-2 sprays into both nostrils daily.   methocarbamol 500 MG tablet Commonly known as: ROBAXIN Take 1  tablet (500 mg total) by mouth every 6 (six) hours as needed for muscle spasms.   oxyCODONE 5 MG immediate release tablet Commonly known as: Oxy IR/ROXICODONE Take 1-2 tablets (5-10 mg total) by mouth every 6 (six) hours as needed for severe pain.   traMADol 50 MG tablet Commonly known as: ULTRAM Take 1-2 tablets (50-100 mg total) by mouth every 6 (six) hours as needed for moderate pain.   VITAMIN B-12 PO Take 1 tablet by mouth daily.   vitamin C 500 MG tablet Commonly known as: ASCORBIC ACID Take 500 mg by mouth daily.   ZINC PLUS VITAMIN C PO Take 1 tablet by mouth daily.            Discharge Care Instructions  (From admission, onward)         Start     Ordered   11/25/20 0000  Weight bearing as tolerated        11/25/20 0752   11/25/20 0000  Change dressing       Comments: You may remove the bulky bandage (ACE wrap and gauze) two days after surgery. You will have an adhesive waterproof bandage underneath. Leave this in place until your first follow-up appointment.   11/25/20 5188          Follow-up Information    Gaynelle Arabian, MD. Go on 12/09/2020.   Specialty: Orthopedic Surgery Why: You are scheduled for first post op appointment on Tuesday March 15th at 2:45pm. Contact information: [address] [place] [address] [phone]        Rosilyn Mings.. Go on 11/27/2020.   Why: You are scheduled for physical therapy eval on Thursday March 3rd at 11:45am. Contact information: [place] [address] [phone]               Signed: Theresa Duty, PA-C Orthopedic Surgery 11/26/2020, 9:03 AM

## 2020-11-27 DIAGNOSIS — M25562 Pain in left knee: Secondary | ICD-10-CM | POA: Diagnosis not present

## 2020-12-02 DIAGNOSIS — M25562 Pain in left knee: Secondary | ICD-10-CM | POA: Diagnosis not present

## 2020-12-05 DIAGNOSIS — M25562 Pain in left knee: Secondary | ICD-10-CM | POA: Diagnosis not present

## 2020-12-09 DIAGNOSIS — M25562 Pain in left knee: Secondary | ICD-10-CM | POA: Diagnosis not present

## 2020-12-11 DIAGNOSIS — M25562 Pain in left knee: Secondary | ICD-10-CM | POA: Diagnosis not present

## 2020-12-16 DIAGNOSIS — M25562 Pain in left knee: Secondary | ICD-10-CM | POA: Diagnosis not present

## 2020-12-19 DIAGNOSIS — M25562 Pain in left knee: Secondary | ICD-10-CM | POA: Diagnosis not present

## 2020-12-23 DIAGNOSIS — M25562 Pain in left knee: Secondary | ICD-10-CM | POA: Diagnosis not present

## 2020-12-24 ENCOUNTER — Other Ambulatory Visit: Payer: Self-pay | Admitting: Family Medicine

## 2020-12-25 DIAGNOSIS — M25562 Pain in left knee: Secondary | ICD-10-CM | POA: Diagnosis not present

## 2020-12-29 NOTE — Assessment & Plan Note (Signed)
Work on lowering animal fats like cheese in diet. Increase activity as tolerated after surgery.

## 2020-12-29 NOTE — Assessment & Plan Note (Signed)
Resolved on supplement 

## 2020-12-29 NOTE — Assessment & Plan Note (Signed)
Resolved

## 2020-12-29 NOTE — Assessment & Plan Note (Signed)
Use diclofenac topical. prn knee pain.

## 2020-12-30 DIAGNOSIS — Z471 Aftercare following joint replacement surgery: Secondary | ICD-10-CM | POA: Diagnosis not present

## 2020-12-30 DIAGNOSIS — Z96652 Presence of left artificial knee joint: Secondary | ICD-10-CM | POA: Diagnosis not present

## 2021-01-12 DIAGNOSIS — M25562 Pain in left knee: Secondary | ICD-10-CM | POA: Diagnosis not present

## 2021-04-08 ENCOUNTER — Telehealth: Payer: Self-pay | Admitting: *Deleted

## 2021-04-08 NOTE — Telephone Encounter (Signed)
Patient left a voicemail stating that her handicap parking sticker expires the end of the month. Patient wants to know if Dr. Diona Browner will complete one for her and call when it is ready for pickup.

## 2021-04-08 NOTE — Telephone Encounter (Signed)
Handicap Placard application placed in Dr. Rometta Emery in box to complete.

## 2021-05-28 ENCOUNTER — Other Ambulatory Visit: Payer: Self-pay

## 2021-05-28 ENCOUNTER — Ambulatory Visit (INDEPENDENT_AMBULATORY_CARE_PROVIDER_SITE_OTHER): Payer: Medicare Other | Admitting: Family Medicine

## 2021-05-28 ENCOUNTER — Encounter: Payer: Self-pay | Admitting: Family Medicine

## 2021-05-28 VITALS — BP 136/78 | HR 64 | Temp 97.7°F | Ht 64.75 in | Wt 125.0 lb

## 2021-05-28 DIAGNOSIS — J029 Acute pharyngitis, unspecified: Secondary | ICD-10-CM | POA: Diagnosis not present

## 2021-05-28 MED ORDER — FLUTICASONE PROPIONATE 50 MCG/ACT NA SUSP: 2.0000 | Freq: Every day | NASAL | 3 refills | Status: DC

## 2021-05-28 NOTE — Assessment & Plan Note (Signed)
Started after likely URI/ possible COVID. Recurred.  No S/S of strep throat.  Most likely PND  Causing ST... restart flonase, change to Xyzal. No mass palpated on exam... likely pt felt lymph node now resolved.   If ST not improving .Marland Kitchen refer to ENT given age. Non smoker.

## 2021-05-28 NOTE — Patient Instructions (Signed)
Restart Flonase 2 sprays per nostril daily. Change zyrtec to Xyzal at bedtime. Call in 1 week if ST is not resolved for a referral ENT.

## 2021-05-28 NOTE — Progress Notes (Signed)
Patient ID: Kathryn [name], female    DOB: 1931-07-24, 87 y.o.   MRN: [id number]  This visit was conducted in person.  BP 136/78   Pulse 64   Temp 97.7 F (36.5 C) (Temporal)   Ht 5' 4.75" (1.645 m)   Wt 125 lb (56.7 kg)   SpO2 97%   BMI 20.96 kg/m    CC: Chief Complaint  Patient presents with   lump on left throat    Subjective:   HPI: Kathryn [name] is a 87 y.o. female presenting on 05/28/2021 for lump on left throat   1.5 weeks ago.. had " cold".. runny nose, no fever. Mild dry tickling cough. Did not test for COVID. Had mild ST.  Mucinex helps.. symptoms all went away..   Feels better overall but  few day after the cold resolve left side throat pain started.  Though she felt lump in left throat.  Now bilateral sore throat    Some mucus in throat. No sneeze or itchy eyes.   No sick contacts.   No trouble with swallowing.  Has been out of flonase for 2 days.  On zyrtec at night.    Relevant past medical, surgical, family and social history reviewed and updated as indicated. Interim medical history since our last visit reviewed. Allergies and medications reviewed and updated. Outpatient Medications Prior to Visit  Medication Sig Dispense Refill   Calcium Carbonate-Vit D-Min (CALCIUM 1200 PO) Take 2 tablets by mouth daily.     cetirizine (ZYRTEC) 10 MG tablet TAKE 1 TABLET DAILY 90 tablet 3   Cyanocobalamin (VITAMIN B-12 PO) Take 1 tablet by mouth daily.     fluticasone (FLONASE) 50 MCG/ACT nasal spray Place 1-2 sprays into both nostrils daily.     methocarbamol (ROBAXIN) 500 MG tablet Take 1 tablet (500 mg total) by mouth every 6 (six) hours as needed for muscle spasms. 40 tablet 0   vitamin C (ASCORBIC ACID) 500 MG tablet Take 500 mg by mouth daily.     Zinc Oxide-Vitamin C (ZINC PLUS VITAMIN C PO) Take 1 tablet by mouth daily.     oxyCODONE (OXY IR/ROXICODONE) 5 MG immediate release tablet Take 1-2 tablets (5-10 mg total) by mouth every 6 (six) hours as needed  for severe pain. 42 tablet 0   traMADol (ULTRAM) 50 MG tablet Take 1-2 tablets (50-100 mg total) by mouth every 6 (six) hours as needed for moderate pain. 40 tablet 0   No facility-administered medications prior to visit.     Per HPI unless specifically indicated in ROS section below Review of Systems  Constitutional:  Negative for fatigue and fever.  HENT:  Negative for ear pain.   Eyes:  Negative for pain.  Respiratory:  Negative for chest tightness and shortness of breath.   Cardiovascular:  Negative for chest pain, palpitations and leg swelling.  Gastrointestinal:  Negative for abdominal pain.  Genitourinary:  Negative for dysuria.  Objective:  BP 136/78   Pulse 64   Temp 97.7 F (36.5 C) (Temporal)   Ht 5' 4.75" (1.645 m)   Wt 125 lb (56.7 kg)   SpO2 97%   BMI 20.96 kg/m   Wt Readings from Last 3 Encounters:  05/28/21 125 lb (56.7 kg)  11/24/20 133 lb 6.1 oz (60.5 kg)  11/14/20 125 lb 7.8 oz (56.9 kg)      Physical Exam Constitutional:      General: She is not in acute distress.    Appearance: Normal  appearance. She is well-developed. She is not ill-appearing or toxic-appearing.  HENT:     Head: Normocephalic.     Right Ear: Hearing, tympanic membrane, ear canal and external ear normal. Tympanic membrane is not erythematous, retracted or bulging.     Left Ear: Hearing, tympanic membrane, ear canal and external ear normal. Tympanic membrane is not erythematous, retracted or bulging.     Nose: Rhinorrhea present. No mucosal edema or congestion.     Right Turbinates: Swollen and pale. Not enlarged.     Left Turbinates: Swollen and pale. Not enlarged.     Right Sinus: No maxillary sinus tenderness or frontal sinus tenderness.     Left Sinus: No maxillary sinus tenderness or frontal sinus tenderness.     Mouth/Throat:     Mouth: Mucous membranes are moist.     Tongue: No lesions.     Palate: No mass.     Pharynx: Uvula midline. No pharyngeal swelling, oropharyngeal  exudate, posterior oropharyngeal erythema or uvula swelling.     Tonsils: No tonsillar exudate or tonsillar abscesses.  Eyes:     General: Lids are normal. Lids are everted, no foreign bodies appreciated.     Conjunctiva/sclera: Conjunctivae normal.     Pupils: Pupils are equal, round, and reactive to light.  Neck:     Thyroid: No thyroid mass or thyromegaly.     Vascular: No carotid bruit.     Trachea: Trachea normal.  Cardiovascular:     Rate and Rhythm: Normal rate and regular rhythm.     Pulses: Normal pulses.     Heart sounds: Normal heart sounds, S1 normal and S2 normal. No murmur heard.   No friction rub. No gallop.  Pulmonary:     Effort: Pulmonary effort is normal. No tachypnea or respiratory distress.     Breath sounds: Normal breath sounds. No decreased breath sounds, wheezing, rhonchi or rales.  Abdominal:     General: Bowel sounds are normal.     Palpations: Abdomen is soft.     Tenderness: There is no abdominal tenderness.  Musculoskeletal:     Cervical back: Normal range of motion and neck supple.  Lymphadenopathy:     Head:     Right side of head: No submental, submandibular, tonsillar, preauricular, posterior auricular or occipital adenopathy.     Left side of head: No submental, submandibular, tonsillar, preauricular, posterior auricular or occipital adenopathy.     Cervical: No cervical adenopathy.     Upper Body:     Right upper body: No supraclavicular adenopathy.     Left upper body: No supraclavicular adenopathy.  Skin:    General: Skin is warm and dry.     Findings: No rash.  Neurological:     Mental Status: She is alert.  Psychiatric:        Mood and Affect: Mood is not anxious or depressed.        Speech: Speech normal.        Behavior: Behavior normal. Behavior is cooperative.        Thought Content: Thought content normal.        Judgment: Judgment normal.      Results for orders placed or performed during the hospital encounter of 11/24/20   CBC  Result Value Ref Range   WBC 10.4 4.0 - 10.5 K/uL   RBC 3.13 (L) 3.87 - 5.11 MIL/uL   Hemoglobin 10.5 (L) 12.0 - 15.0 g/dL   HCT 31.8 (L) 36.0 - 46.0 %  MCV 101.6 (H) 80.0 - 100.0 fL   MCH 33.5 26.0 - 34.0 pg   MCHC 33.0 30.0 - 36.0 g/dL   RDW 13.0 11.5 - 15.5 %   Platelets 202 150 - 400 K/uL   nRBC 0.0 0.0 - 0.2 %  Basic metabolic panel  Result Value Ref Range   Sodium 137 135 - 145 mmol/L   Potassium 4.0 3.5 - 5.1 mmol/L   Chloride 105 98 - 111 mmol/L   CO2 25 22 - 32 mmol/L   Glucose, Bld 183 (H) 70 - 99 mg/dL   BUN 30 (H) 8 - 23 mg/dL   Creatinine, Ser 0.79 0.44 - 1.00 mg/dL   Calcium 8.7 (L) 8.9 - 10.3 mg/dL   GFR, Estimated >60 >60 mL/min   Anion gap 7 5 - 15    This visit occurred during the SARS-CoV-2 public health emergency.  Safety protocols were in place, including screening questions prior to the visit, additional usage of staff PPE, and extensive cleaning of exam room while observing appropriate contact time as indicated for disinfecting solutions.   COVID 19 screen:  No recent travel or known exposure to COVID19 The patient denies respiratory symptoms of COVID 19 at this time. The importance of social distancing was discussed today.   Assessment and Plan    Problem List Items Addressed This Visit     Sore throat - Primary    Started after likely URI/ possible COVID. Recurred.  No S/S of strep throat.  Most likely PND  Causing ST... restart flonase, change to Xyzal. No mass palpated on exam... likely pt felt lymph node now resolved.   If ST not improving .Marland Kitchen refer to ENT given age. Non smoker.        Eliezer Lofts, MD

## 2021-06-16 ENCOUNTER — Telehealth: Payer: Self-pay | Admitting: Family Medicine

## 2021-06-16 ENCOUNTER — Other Ambulatory Visit: Payer: Self-pay | Admitting: Family Medicine

## 2021-06-16 DIAGNOSIS — J312 Chronic pharyngitis: Secondary | ICD-10-CM

## 2021-06-16 NOTE — Telephone Encounter (Signed)
Pt called in stated still having sore throat would like to know next steps. Would like a call back#[phone]

## 2021-06-16 NOTE — Telephone Encounter (Signed)
Please triage

## 2021-06-16 NOTE — Telephone Encounter (Signed)
I spoke with pt;pt is taking Xyzal and slight improvement but not a whole lot of improvement;pt said the swelling of throat is on the sides of throat, throat is sore and not scratchy but pt can swallow foods and liquids, no difficulty in breathing. No fever. Pt still has slight PND. Per 05/29/2011 visit if S/T continued would do a n ENT referral. Pt wants to go to [facility]. Pt request cb after reviewed by Dr Diona Browner.[facility] [address] if needed. Sending note to Dr Diona Browner and Butch Penny CMA.

## 2021-06-17 NOTE — Telephone Encounter (Signed)
Ms. Lanagan notified as instructed by telephone.

## 2021-08-19 DIAGNOSIS — J343 Hypertrophy of nasal turbinates: Secondary | ICD-10-CM | POA: Diagnosis not present

## 2021-08-19 DIAGNOSIS — R070 Pain in throat: Secondary | ICD-10-CM | POA: Diagnosis not present

## 2021-08-19 DIAGNOSIS — J310 Chronic rhinitis: Secondary | ICD-10-CM | POA: Diagnosis not present

## 2021-10-19 DIAGNOSIS — Z1231 Encounter for screening mammogram for malignant neoplasm of breast: Secondary | ICD-10-CM | POA: Diagnosis not present

## 2021-10-22 ENCOUNTER — Telehealth: Payer: Self-pay | Admitting: Family Medicine

## 2021-10-22 NOTE — Telephone Encounter (Signed)
Please schedule CPE with fasting labs prior with Dr. Diona Browner.  Already has [place] with nurse on 10/29/2021.  Please schedule sometime for when we are back at [facility].

## 2021-10-22 NOTE — Telephone Encounter (Signed)
Lvm for pt to call office to schedule cpe/lab

## 2021-10-27 DIAGNOSIS — R928 Other abnormal and inconclusive findings on diagnostic imaging of breast: Secondary | ICD-10-CM | POA: Diagnosis not present

## 2021-10-27 LAB — HM MAMMOGRAPHY

## 2021-10-27 NOTE — Telephone Encounter (Signed)
Called Mrs. Rhyli and got her scheduled for 4/14 @11 

## 2021-10-28 ENCOUNTER — Encounter: Payer: Self-pay | Admitting: Family Medicine

## 2021-10-28 NOTE — Progress Notes (Signed)
Subjective:   Kathryn [name] is a 88 y.o. female who presents for Medicare Annual (Subsequent) preventive examination.  I connected with Kathryn [name] today by telephone and verified that I am speaking with the correct person using two identifiers. Location patient: home Location provider: work Persons participating in the virtual visit: patient, Marine scientist.    I discussed the limitations, risks, security and privacy concerns of performing an evaluation and management service by telephone and the availability of in person appointments. I also discussed with the patient that there may be a patient responsible charge related to this service. The patient expressed understanding and verbally consented to this telephonic visit.    Interactive audio and video telecommunications were attempted between this provider and patient, however failed, due to patient having technical difficulties OR patient did not have access to video capability.  We continued and completed visit with audio only.  Some vital signs may be absent or patient reported.   Time Spent with patient on telephone encounter: 20 minutes  Review of Systems     Cardiac Risk Factors include: advanced age (>36men, >30 women)     Objective:    Today's Vitals   10/29/21 1028  Weight: 125 lb (56.7 kg)  Height: 5\' 4"  (1.626 m)   Body mass index is 21.46 kg/m.  Advanced Directives 10/29/2021 11/24/2020 11/24/2020 11/14/2020 10/28/2020 10/16/2019 10/09/2018  Does Patient Have a Medical Advance Directive? Yes - Yes Yes Yes Yes Yes  Type of Paramedic of [place];Living will - [place];Living will [place];Living will [place];Living will [place];Living will Living will;Healthcare Power of Attorney  Does patient want to make changes to medical advance directive? Yes (MAU/Ambulatory/Procedural Areas - Information given) No - Patient declined - - -  - -  Copy of [place] in Chart? - - - - No - copy requested No - copy requested No - copy requested    Current Medications (verified) Outpatient Encounter Medications as of 10/29/2021  Medication Sig   Calcium Carbonate-Vit D-Min (CALCIUM 1200 PO) Take 2 tablets by mouth daily.   cetirizine (ZYRTEC) 10 MG tablet TAKE 1 TABLET DAILY   Cyanocobalamin (VITAMIN B-12 PO) Take 1 tablet by mouth daily.   fluticasone (FLONASE) 50 MCG/ACT nasal spray USE 2 SPRAYS IN EACH NOSTRIL DAILY   methocarbamol (ROBAXIN) 500 MG tablet Take 1 tablet (500 mg total) by mouth every 6 (six) hours as needed for muscle spasms.   vitamin C (ASCORBIC ACID) 500 MG tablet Take 500 mg by mouth daily.   Zinc Oxide-Vitamin C (ZINC PLUS VITAMIN C PO) Take 1 tablet by mouth daily.   No facility-administered encounter medications on file as of 10/29/2021.    Allergies (verified) Macrobid [nitrofurantoin monohyd macro]   History: Past Medical History:  Diagnosis Date   Allergy    Arthritis    Colon polyps    History of blood transfusion    with previous surgeries   Hyperlipidemia    Irregular heart rate    PONV (postoperative nausea and vomiting)    hx of pt. states she thinks was from pain med.   Past Surgical History:  Procedure Laterality Date   CARDIAC CATHETERIZATION     HAND SURGERY Left    TOTAL HIP ARTHROPLASTY  2007   LEFT   TOTAL HIP ARTHROPLASTY Right 10/13/2016   Procedure: RIGHT TOTAL HIP ARTHROPLASTY ANTERIOR APPROACH;  Surgeon: Gaynelle Arabian, MD;  Location: [facility];  Service: Orthopedics;  Laterality: Right;   TOTAL KNEE ARTHROPLASTY     Right    TOTAL KNEE ARTHROPLASTY Left 11/24/2020   Procedure: TOTAL KNEE ARTHROPLASTY;  Surgeon: Gaynelle Arabian, MD;  Location: [facility];  Service: Orthopedics;  Laterality: Left;  41min   TUBAL LIGATION     Family History  Problem Relation Age of Onset   Pancreatic cancer Mother    Hypertension Sister    Heart disease Sister    Breast cancer  Sister        Age 47's   Heart attack Brother    Heart disease Brother    Hypertension Brother    Bone cancer Sister    Heart disease Brother    Colon cancer Neg Hx    Colon polyps Neg Hx    Esophageal cancer Neg Hx    Diabetes Neg Hx    Kidney disease Neg Hx    Gallbladder disease Neg Hx    Social History   Socioeconomic History   Marital status: Widowed    Spouse name: Not on file   Number of children: 1   Years of education: Not on file   Highest education level: Not on file  Occupational History   Occupation: Chartered loss adjuster    Comment: Works at [employer] Use   Smoking status: Never   Smokeless tobacco: Never  Vaping Use   Vaping Use: Never used  Substance and Sexual Activity   Alcohol use: Yes    Alcohol/week: 3.0 standard drinks    Types: 3 Standard drinks or equivalent per week    Comment: Occassionally   Drug use: No   Sexual activity: Never    Birth control/protection: Post-menopausal, Surgical  Other Topics Concern   Not on file  Social History Narrative   Regular exercise-- yes, 3-4 times a week      Diet: limited water, some fruit and veggies      Full code, has living will, HCPOA, son [name] ( reviewed 2015)   Social Determinants of Health   Financial Resource Strain: Low Risk    Difficulty of Paying Living Expenses: Not hard at all  Food Insecurity: No Food Insecurity   Worried About Charity fundraiser in the Last Year: Never true   Ran Out of Food in the Last Year: Never true  Transportation Needs: No Transportation Needs   Lack of Transportation (Medical): No   Lack of Transportation (Non-Medical): No  Physical Activity: Sufficiently Active   Days of Exercise per Week: 7 days   Minutes of Exercise per Session: 30 min  Stress: No Stress Concern Present   Feeling of Stress : Not at all  Social Connections: Moderately Isolated   Frequency of Communication with Friends and Family: More than three times a week   Frequency of  Social Gatherings with Friends and Family: More than three times a week   Attends Religious Services: 1 to 4 times per year   Active Member of [employer] or Organizations: No   Attends Archivist Meetings: Never   Marital Status: Widowed    Tobacco Counseling Counseling given: Not Answered   Clinical Intake:  Pre-visit preparation completed: Yes  Pain : No/denies pain     BMI - recorded: 21.46 Nutritional Status: BMI of 19-24  Normal Nutritional Risks: None Diabetes: No  How often do you need to have someone help you when you read instructions, pamphlets, or other written materials from your doctor or pharmacy?:  1 - Never  Diabetic? No  Interpreter Needed?: No  Information entered by :: Orrin Brigham LPN   Activities of Daily Living In your present state of health, do you have any difficulty performing the following activities: 10/29/2021 11/24/2020  Hearing? Tempie Donning  Vision? N N  Difficulty concentrating or making decisions? N N  Walking or climbing stairs? N N  Dressing or bathing? N N  Doing errands, shopping? N N  Preparing Food and eating ? N -  Using the Toilet? N -  In the past six months, have you accidently leaked urine? N -  Do you have problems with loss of bowel control? N -  Managing your Medications? N -  Managing your Finances? N -  Housekeeping or managing your Housekeeping? N -  Some recent data might be hidden    Patient Care Team: Jinny Sanders, MD as PCP - General Marica Otter, OD as Referring Physician (Optometry)  Indicate any recent Medical Services you may have received from other than [facility] providers in the past year (date may be approximate).     Assessment:   This is a routine wellness examination for [place].  Hearing/Vision screen Hearing Screening - Comments:: Decrease in hearing  Vision Screening - Comments:: Last exam 10/2021, [facility], wears contacts  Dietary issues and exercise activities discussed: Current  Exercise Habits: Home exercise routine, Type of exercise: walking, Time (Minutes): 30, Frequency (Times/Week): 7, Weekly Exercise (Minutes/Week): 210, Intensity: Moderate   Goals Addressed             This Visit's Progress    Patient Stated       Would like to maintain current routine       Depression Screen PHQ 2/9 Scores 10/29/2021 10/28/2020 10/16/2019 10/09/2018 10/04/2017 07/13/2016 07/08/2015  PHQ - 2 Score 0 0 0 0 0 0 0  PHQ- 9 Score - 0 0 0 0 - -    Fall Risk Fall Risk  10/29/2021 10/28/2020 10/16/2019 10/09/2018 10/04/2017  Falls in the past year? 0 1 1 1  No  Comment - - tripped and fell at grocery store fell at work; injury to back head; treatment in ER -  Number falls in past yr: 0 0 0 0 -  Injury with Fall? 0 0 0 1 -  Risk for fall due to : No Fall Risks Impaired balance/gait - - -  Follow up Falls prevention discussed Falls evaluation completed;Falls prevention discussed Falls evaluation completed;Falls prevention discussed - -    FALL RISK PREVENTION PERTAINING TO THE HOME:  Any stairs in or around the home? No  If so, are there any without handrails? No  Home free of loose throw rugs in walkways, pet beds, electrical cords, etc? No  Adequate lighting in your home to reduce risk of falls? Yes   ASSISTIVE DEVICES UTILIZED TO PREVENT FALLS:  Life alert? No  Use of a cane, walker or w/c? No  Grab bars in the bathroom? Yes  Shower chair or bench in shower? No  Elevated toilet seat or a handicapped toilet? No   TIMED UP AND GO:  Was the test performed? No .    Cognitive Function: Normal cognitive status assessed by this Nurse Health Advisor. No abnormalities found.   MMSE - Mini Mental State Exam 10/28/2020 10/16/2019 10/09/2018 10/04/2017  Orientation to time 5 5 5 5   Orientation to Place 5 5 5 5   Registration 3 3 3 3   Attention/ Calculation 5 5 0 0  Recall 3 3 3 3   Language- name 2 objects - - 0 0  Language- repeat 1 1 1 1   Language- follow 3 step command - - 3 3   Language- read & follow direction - - 0 0  Write a sentence - - 0 0  Copy design - - 0 0  Total score - - 20 20        Immunizations Immunization History  Administered Date(s) Administered   Fluad Quad(high Dose 65+) 07/12/2019, 09/04/2020   Influenza Split 08/29/2012   Influenza Whole 06/27/2006, 07/14/2007, 06/18/2008, 08/05/2009, 06/23/2010   Influenza,inj,Quad PF,6+ Mos 08/14/2013, 07/18/2014, 07/08/2015, 07/13/2016, 08/05/2017, 08/08/2018   Influenza-Unspecified 07/13/2018   Pneumococcal Conjugate-13 04/09/2014   Pneumococcal Polysaccharide-23 09/27/2005   Td 09/27/2004   Zoster, Live 06/18/2013    TDAP status: Due, Education has been provided regarding the importance of this vaccine. Advised may receive this vaccine at local pharmacy or Health Dept. Aware to provide a copy of the vaccination record if obtained from local pharmacy or Health Dept. Verbalized acceptance and understanding.  Flu Vaccine status: Up to date  Pneumococcal vaccine status: Up to date  Covid-19 vaccine status: Declined, Education has been provided regarding the importance of this vaccine but patient still declined. Advised may receive this vaccine at local pharmacy or Health Dept.or vaccine clinic. Aware to provide a copy of the vaccination record if obtained from local pharmacy or Health Dept. Verbalized acceptance and understanding.  Qualifies for Shingles Vaccine? Yes   Zostavax completed Yes   Shingrix Completed?: Yes  Screening Tests Health Maintenance  Topic Date Due   Zoster Vaccines- Shingrix (1 of 2) Never done   DEXA SCAN  08/15/2020   INFLUENZA VACCINE  04/27/2021   COVID-19 Vaccine (1) 11/13/2021 (Originally 01/29/1932)   TETANUS/TDAP  10/29/2023 (Originally 09/27/2014)   MAMMOGRAM  10/27/2022   Pneumonia Vaccine 28+ Years old  Completed   HPV VACCINES  Aged Out    Health Maintenance  Health Maintenance Due  Topic Date Due   Zoster Vaccines- Shingrix (1 of 2) Never done   DEXA  SCAN  08/15/2020   INFLUENZA VACCINE  04/27/2021    Colorectal cancer screening: No longer required.   Mammogram status: Completed 10/17/21. Repeat every year  Bone Density status: No longer required   Lung Cancer Screening: (Low Dose CT Chest recommended if Age 75-80 years, 30 pack-year currently smoking OR have quit w/in 15years.) does not qualify.     Additional Screening:  Hepatitis C Screening: does not qualify  Vision Screening: Recommended annual ophthalmology exams for early detection of glaucoma and other disorders of the eye. Is the patient up to date with their annual eye exam?  Yes  Who is the provider or what is the name of the office in which the patient attends annual eye exams? Dr. Sabra Heck   Dental Screening: Recommended annual dental exams for proper oral hygiene  Community Resource Referral / Chronic Care Management: CRR required this visit?  No   CCM required this visit?  No      Plan:     I have personally reviewed and noted the following in the patients chart:   Medical and social history Use of alcohol, tobacco or illicit drugs  Current medications and supplements including opioid prescriptions.  Functional ability and status Nutritional status Physical activity Advanced directives List of other physicians Hospitalizations, surgeries, and ER visits in previous 12 months Vitals Screenings to include cognitive, depression, and falls Referrals and appointments  In  addition, I have reviewed and discussed with patient certain preventive protocols, quality metrics, and best practice recommendations. A written personalized care plan for preventive services as well as general preventive health recommendations were provided to patient.   Due to this being a telephonic visit, the after visit summary with patients personalized plan was offered to patient via mail or my-chart. Patient preferred to pick up at office at next visit.   Loma Messing,  LPN   [id number]/[id number]   Nurse Health Advisor  Nurse Notes: Patient plans to provide update flu and shingles vaccine information at next visit

## 2021-10-29 ENCOUNTER — Ambulatory Visit (INDEPENDENT_AMBULATORY_CARE_PROVIDER_SITE_OTHER): Payer: PPO

## 2021-10-29 VITALS — Ht 64.0 in | Wt 125.0 lb

## 2021-10-29 DIAGNOSIS — Z0000 Encounter for general adult medical examination without abnormal findings: Secondary | ICD-10-CM | POA: Diagnosis not present

## 2021-10-29 NOTE — Patient Instructions (Addendum)
Kathryn [name] , Thank you for taking time to complete your Medicare Wellness Visit. I appreciate your ongoing commitment to your health goals. Please review the following plan we discussed and let me know if I can assist you in the future.   Screening recommendations/referrals: Colonoscopy: no longer required Mammogram: up to date, completed 10/27/21 Bone Density: no longer required  Recommended yearly ophthalmology/optometry visit for glaucoma screening and checkup Recommended yearly dental visit for hygiene and checkup  Vaccinations: Influenza vaccine: up to date, per our conversation, please provide updated vaccine information  Pneumococcal vaccine: up to date Tdap vaccine: Due-last completed 09/27/14, May obtain vaccine at your local pharmacy. Shingles vaccine: up to date, per our conversation, please provide updated vaccine information    Covid-19:newest booster available at your local pharmacy  Advanced directives: Please bring a copy of Living Will and/or Healthcare Power of Attorney for your chart.   Conditions/risks identified: see problem list   Next appointment: Follow up in one year for your annual wellness visit 11/01/22 @ 11:15am, this will be a telephone visit   Preventive Care 86 Years and Older, Female Preventive care refers to lifestyle choices and visits with your health care provider that can promote health and wellness. What does preventive care include? A yearly physical exam. This is also called an annual well check. Dental exams once or twice a year. Routine eye exams. Ask your health care provider how often you should have your eyes checked. Personal lifestyle choices, including: Daily care of your teeth and gums. Regular physical activity. Eating a healthy diet. Avoiding tobacco and drug use. Limiting alcohol use. Practicing safe sex. Taking low-dose aspirin every day. Taking vitamin and mineral supplements as recommended by your health care provider. What  happens during an annual well check? The services and screenings done by your health care provider during your annual well check will depend on your age, overall health, lifestyle risk factors, and family history of disease. Counseling  Your health care provider may ask you questions about your: Alcohol use. Tobacco use. Drug use. Emotional well-being. Home and relationship well-being. Sexual activity. Eating habits. History of falls. Memory and ability to understand (cognition). Work and work Statistician. Reproductive health. Screening  You may have the following tests or measurements: Height, weight, and BMI. Blood pressure. Lipid and cholesterol levels. These may be checked every 5 years, or more frequently if you are over 86 years old. Skin check. Lung cancer screening. You may have this screening every year starting at age 86 if you have a 30-pack-year history of smoking and currently smoke or have quit within the past 15 years. Fecal occult blood test (FOBT) of the stool. You may have this test every year starting at age 86. Flexible sigmoidoscopy or colonoscopy. You may have a sigmoidoscopy every 5 years or a colonoscopy every 10 years starting at age 86. Hepatitis C blood test. Hepatitis B blood test. Sexually transmitted disease (STD) testing. Diabetes screening. This is done by checking your blood sugar (glucose) after you have not eaten for a while (fasting). You may have this done every 1-3 years. Bone density scan. This is done to screen for osteoporosis. You may have this done starting at age 86. Mammogram. This may be done every 1-2 years. Talk to your health care provider about how often you should have regular mammograms. Talk with your health care provider about your test results, treatment options, and if necessary, the need for more tests. Vaccines  Your health care provider may  recommend certain vaccines, such as: Influenza vaccine. This is recommended every  year. Tetanus, diphtheria, and acellular pertussis (Tdap, Td) vaccine. You may need a Td booster every 10 years. Zoster vaccine. You may need this after age 86. Pneumococcal 13-valent conjugate (PCV13) vaccine. One dose is recommended after age 86. Pneumococcal polysaccharide (PPSV23) vaccine. One dose is recommended after age 86. Talk to your health care provider about which screenings and vaccines you need and how often you need them. This information is not intended to replace advice given to you by your health care provider. Make sure you discuss any questions you have with your health care provider. Document Released: 10/10/2015 Document Revised: 06/02/2016 Document Reviewed: 07/15/2015 Elsevier Interactive Patient Education  2017 [place] Prevention in the Home Falls can cause injuries. They can happen to people of all ages. There are many things you can do to make your home safe and to help prevent falls. What can I do on the outside of my home? Regularly fix the edges of walkways and driveways and fix any cracks. Remove anything that might make you trip as you walk through a door, such as a raised step or threshold. Trim any bushes or trees on the path to your home. Use bright outdoor lighting. Clear any walking paths of anything that might make someone trip, such as rocks or tools. Regularly check to see if handrails are loose or broken. Make sure that both sides of any steps have handrails. Any raised decks and porches should have guardrails on the edges. Have any leaves, snow, or ice cleared regularly. Use sand or salt on walking paths during winter. Clean up any spills in your garage right away. This includes oil or grease spills. What can I do in the bathroom? Use night lights. Install grab bars by the toilet and in the tub and shower. Do not use towel bars as grab bars. Use non-skid mats or decals in the tub or shower. If you need to sit down in the shower, use a  plastic, non-slip stool. Keep the floor dry. Clean up any water that spills on the floor as soon as it happens. Remove soap buildup in the tub or shower regularly. Attach bath mats securely with double-sided non-slip rug tape. Do not have throw rugs and other things on the floor that can make you trip. What can I do in the bedroom? Use night lights. Make sure that you have a light by your bed that is easy to reach. Do not use any sheets or blankets that are too big for your bed. They should not hang down onto the floor. Have a firm chair that has side arms. You can use this for support while you get dressed. Do not have throw rugs and other things on the floor that can make you trip. What can I do in the kitchen? Clean up any spills right away. Avoid walking on wet floors. Keep items that you use a lot in easy-to-reach places. If you need to reach something above you, use a strong step stool that has a grab bar. Keep electrical cords out of the way. Do not use floor polish or wax that makes floors slippery. If you must use wax, use non-skid floor wax. Do not have throw rugs and other things on the floor that can make you trip. What can I do with my stairs? Do not leave any items on the stairs. Make sure that there are handrails on both sides of  the stairs and use them. Fix handrails that are broken or loose. Make sure that handrails are as long as the stairways. Check any carpeting to make sure that it is firmly attached to the stairs. Fix any carpet that is loose or worn. Avoid having throw rugs at the top or bottom of the stairs. If you do have throw rugs, attach them to the floor with carpet tape. Make sure that you have a light switch at the top of the stairs and the bottom of the stairs. If you do not have them, ask someone to add them for you. What else can I do to help prevent falls? Wear shoes that: Do not have high heels. Have rubber bottoms. Are comfortable and fit you  well. Are closed at the toe. Do not wear sandals. If you use a stepladder: Make sure that it is fully opened. Do not climb a closed stepladder. Make sure that both sides of the stepladder are locked into place. Ask someone to hold it for you, if possible. Clearly mark and make sure that you can see: Any grab bars or handrails. First and last steps. Where the edge of each step is. Use tools that help you move around (mobility aids) if they are needed. These include: Canes. Walkers. Scooters. Crutches. Turn on the lights when you go into a dark area. Replace any light bulbs as soon as they burn out. Set up your furniture so you have a clear path. Avoid moving your furniture around. If any of your floors are uneven, fix them. If there are any pets around you, be aware of where they are. Review your medicines with your doctor. Some medicines can make you feel dizzy. This can increase your chance of falling. Ask your doctor what other things that you can do to help prevent falls. This information is not intended to replace advice given to you by your health care provider. Make sure you discuss any questions you have with your health care provider. Document Released: 07/10/2009 Document Revised: 02/19/2016 Document Reviewed: 10/18/2014 Elsevier Interactive Patient Education  2017 [employer].

## 2021-11-11 ENCOUNTER — Other Ambulatory Visit: Payer: Self-pay | Admitting: Radiology

## 2021-11-11 ENCOUNTER — Other Ambulatory Visit: Payer: Self-pay | Admitting: Family Medicine

## 2021-11-11 ENCOUNTER — Encounter: Payer: Self-pay | Admitting: Family Medicine

## 2021-11-11 DIAGNOSIS — N6321 Unspecified lump in the left breast, upper outer quadrant: Secondary | ICD-10-CM | POA: Diagnosis not present

## 2022-01-05 ENCOUNTER — Telehealth: Payer: Self-pay

## 2022-01-05 ENCOUNTER — Emergency Department (HOSPITAL_COMMUNITY): Payer: PPO

## 2022-01-05 ENCOUNTER — Inpatient Hospital Stay (HOSPITAL_COMMUNITY)
Admission: EM | Admit: 2022-01-05 | Discharge: 2022-01-08 | DRG: 054 | Disposition: A | Discharge status: Home Health | Payer: PPO | Attending: Internal Medicine | Admitting: Internal Medicine

## 2022-01-05 DIAGNOSIS — D496 Neoplasm of unspecified behavior of brain: Principal | ICD-10-CM | POA: Diagnosis present

## 2022-01-05 DIAGNOSIS — Z8249 Family history of ischemic heart disease and other diseases of the circulatory system: Secondary | ICD-10-CM | POA: Diagnosis not present

## 2022-01-05 DIAGNOSIS — E785 Hyperlipidemia, unspecified: Secondary | ICD-10-CM | POA: Diagnosis not present

## 2022-01-05 DIAGNOSIS — R4182 Altered mental status, unspecified: Principal | ICD-10-CM

## 2022-01-05 DIAGNOSIS — R5383 Other fatigue: Secondary | ICD-10-CM | POA: Diagnosis not present

## 2022-01-05 DIAGNOSIS — G9389 Other specified disorders of brain: Secondary | ICD-10-CM

## 2022-01-05 DIAGNOSIS — Z96652 Presence of left artificial knee joint: Secondary | ICD-10-CM | POA: Diagnosis not present

## 2022-01-05 DIAGNOSIS — N390 Urinary tract infection, site not specified: Secondary | ICD-10-CM | POA: Diagnosis not present

## 2022-01-05 DIAGNOSIS — G936 Cerebral edema: Secondary | ICD-10-CM | POA: Diagnosis not present

## 2022-01-05 DIAGNOSIS — Z800 Family history of malignant neoplasm of digestive organs: Secondary | ICD-10-CM

## 2022-01-05 DIAGNOSIS — R410 Disorientation, unspecified: Secondary | ICD-10-CM | POA: Diagnosis present

## 2022-01-05 DIAGNOSIS — G935 Compression of brain: Secondary | ICD-10-CM | POA: Diagnosis not present

## 2022-01-05 DIAGNOSIS — Z79899 Other long term (current) drug therapy: Secondary | ICD-10-CM | POA: Diagnosis not present

## 2022-01-05 DIAGNOSIS — Z803 Family history of malignant neoplasm of breast: Secondary | ICD-10-CM | POA: Diagnosis not present

## 2022-01-05 DIAGNOSIS — I499 Cardiac arrhythmia, unspecified: Secondary | ICD-10-CM | POA: Diagnosis present

## 2022-01-05 DIAGNOSIS — D539 Nutritional anemia, unspecified: Secondary | ICD-10-CM | POA: Diagnosis present

## 2022-01-05 DIAGNOSIS — Z96641 Presence of right artificial hip joint: Secondary | ICD-10-CM | POA: Diagnosis present

## 2022-01-05 DIAGNOSIS — R220 Localized swelling, mass and lump, head: Secondary | ICD-10-CM | POA: Diagnosis not present

## 2022-01-05 LAB — CBC WITH DIFFERENTIAL/PLATELET
Abs Immature Granulocytes: 0.03 10*3/uL (ref 0.00–0.07)
Basophils Absolute: 0 10*3/uL (ref 0.0–0.1)
Basophils Relative: 0 %
Eosinophils Absolute: 0.1 10*3/uL (ref 0.0–0.5)
Eosinophils Relative: 1 %
HCT: 37 % (ref 36.0–46.0)
Hemoglobin: 11.7 g/dL — ABNORMAL LOW (ref 12.0–15.0)
Immature Granulocytes: 0 %
Lymphocytes Relative: 10 %
Lymphs Abs: 0.8 10*3/uL (ref 0.7–4.0)
MCH: 32.1 pg (ref 26.0–34.0)
MCHC: 31.6 g/dL (ref 30.0–36.0)
MCV: 101.6 fL — ABNORMAL HIGH (ref 80.0–100.0)
Monocytes Absolute: 0.6 10*3/uL (ref 0.1–1.0)
Monocytes Relative: 7 %
Neutro Abs: 6.5 10*3/uL (ref 1.7–7.7)
Neutrophils Relative %: 82 %
Platelets: 322 10*3/uL (ref 150–400)
RBC: 3.64 MIL/uL — ABNORMAL LOW (ref 3.87–5.11)
RDW: 13.4 % (ref 11.5–15.5)
WBC: 8 10*3/uL (ref 4.0–10.5)
nRBC: 0 % (ref 0.0–0.2)

## 2022-01-05 LAB — COMPREHENSIVE METABOLIC PANEL
ALT: 15 U/L (ref 0–44)
AST: 20 U/L (ref 15–41)
Albumin: 3.6 g/dL (ref 3.5–5.0)
Alkaline Phosphatase: 74 U/L (ref 38–126)
Anion gap: 7 (ref 5–15)
BUN: 29 mg/dL — ABNORMAL HIGH (ref 8–23)
CO2: 27 mmol/L (ref 22–32)
Calcium: 10.4 mg/dL — ABNORMAL HIGH (ref 8.9–10.3)
Chloride: 108 mmol/L (ref 98–111)
Creatinine, Ser: 0.74 mg/dL (ref 0.44–1.00)
GFR, Estimated: >60 mL/min (ref >60)
Glucose, Bld: 97 mg/dL (ref 70–99)
Potassium: 3.8 mmol/L (ref 3.5–5.1)
Sodium: 142 mmol/L (ref 135–145)
Total Bilirubin: 0.6 mg/dL (ref 0.3–1.2)
Total Protein: 7.6 g/dL (ref 6.5–8.1)

## 2022-01-05 LAB — TROPONIN I (HIGH SENSITIVITY)
Troponin I (High Sensitivity): 9 ng/L (ref <18)
Troponin I (High Sensitivity): 8 ng/L (ref <18)

## 2022-01-05 LAB — ETHANOL: Alcohol, Ethyl (B): <10 mg/dL (ref <10)

## 2022-01-05 MED ORDER — GADOBUTROL 1 MMOL/ML IV SOLN
5.5000 mL | Freq: Once | INTRAVENOUS | Status: AC | PRN
  Administered 2022-01-05: 5.5 mL via INTRAVENOUS

## 2022-01-05 MED ORDER — DEXAMETHASONE SODIUM PHOSPHATE 10 MG/ML IJ SOLN
10.0000 mg | Freq: Once | INTRAMUSCULAR | Status: AC
  Administered 2022-01-05: 10 mg via INTRAVENOUS
  Filled 2022-01-05: qty 1

## 2022-01-05 NOTE — ED Provider Triage Note (Signed)
Emergency Medicine Provider Triage Evaluation Note ? ?Kathryn [name] , a 88 y.o. female  was evaluated in triage.  Pt son reports behavior change.  She has reportedly had mood changes over the past few days.  ? ?She had been thinking she was going to go to work.  ? ?She thinks the year is 57.  She thinks its September and the most recent big holiday was christmas.  ? ?This has been present for over 48 hours.  She usually knows the year.  ? ?Physical Exam  ?BP (!) 158/84 (BP Location: Left Arm)   Pulse 90   Temp 97.6 ?F (36.4 ?C) (Oral)   Resp 16   SpO2 100%  ?Gen:   Awake, no distress   ?Resp:  Normal effort  ?MSK:   Moves extremities without difficulty  ?Other:  Patient oriented to person, place, not to time.   ? ?Medical Decision Making  ?Medically screening exam initiated at 7:13 PM.  Appropriate orders placed.  Kathryn [name] was informed that the remainder of the evaluation will be completed by another provider, this initial triage assessment does not replace that evaluation, and the importance of remaining in the ED until their evaluation is complete. ? ? ?  ?Lorin Glass, [place] ?01/05/22 1921 ? ?

## 2022-01-05 NOTE — Telephone Encounter (Signed)
Spoke with pts son Kathryn [name] [facility] signed);who said for few months pt sleeps a lot during the day and then sleeps all night; Kathryn [name] said pt is getting worse by sleeping more. For 5 - 6 days pt having more and more confusion; Kathryn [name] said one morning pt had gotten up and set a large pot to the side of the stove with nothing in it but the burner on top of the stove was on and pt was sitting in chair asleep; one day pt was getting dressed and Kathryn [name] asked pt where she was going and pt said she was going to work and pt has not worked in many years and took Chief Operating Officer to convince pt she did not have to go to work; one day pt was in car with her son and kept pushing buttons and pt could not remember how to roll down window and pt has had the same car a long time. Also pt is beng very quiet, pt usually fusses per Kathryn [name] but pt is just not acting like herself. Pt knows who she is, where she is and pt knows who Kathryn [name] is. Kathryn [name] is afraid pt has had a light stroke. Kathryn [name] said pt refuses to go to the doctor. I spoke with pt and she said she has appt with DR Diona Browner soon and I advised that was correct but pt needs to be eval today to make sure of what causing her not to act like herself. Pt said Kathryn [name] had to take dog to vet and then when he got back she would go to [facility] ED. Verified with Kathryn [name] and he will take pt to [facility] ED by car when  he gets home from vet appt. Sending note to DR Diona Browner and Butch Penny CMA. And will teams Kathryn [name] CMA who is working with Dr Diona Browner today. ?

## 2022-01-05 NOTE — ED Provider Notes (Signed)
?[facility] ?Provider Note ? ? ?CSN: [id number] ?Arrival date & time: 01/05/22  1724 ? ?  ? ?History ? ?Chief Complaint  ?Patient presents with  ? Altered Mental Status  ? ? ?Kathryn [name] is a 89 y.o. female.  Presented to the emergency department with concern for altered mental status.  History is provided primarily by son due to patient's general confusion.  Son states that over the past few weeks she is noted his mom has been a little bit more sleepy than normal but over the last week she has been acting differently, strange behavior at times.  Seems to be more confused than normal.  Patient denies any medical complaints at present.  She denies any pain right now.  She is able to answer basic questions but struggles with any complex questioning. ? ?HPI ? ?  ? ?Home Medications ?Prior to Admission medications   ?Medication Sig Start Date End Date Taking? Authorizing Provider  ?Calcium Carbonate-Vit D-Min (CALCIUM 1200 PO) Take 2 tablets by mouth daily.    [provider]  ?cetirizine (ZYRTEC) 10 MG tablet TAKE 1 TABLET DAILY 12/24/20   Bedsole, Amy E, MD  ?Cyanocobalamin (VITAMIN B-12 PO) Take 1 tablet by mouth daily.    [provider]  ?fluticasone (FLONASE) 50 MCG/ACT nasal spray USE 2 SPRAYS IN EACH NOSTRIL DAILY 11/12/21   Bedsole, Amy E, MD  ?methocarbamol (ROBAXIN) 500 MG tablet Take 1 tablet (500 mg total) by mouth every 6 (six) hours as needed for muscle spasms. 11/25/20   Edmisten, Ok Anis, PA  ?vitamin C (ASCORBIC ACID) 500 MG tablet Take 500 mg by mouth daily.    [provider]  ?Zinc Oxide-Vitamin C (ZINC PLUS VITAMIN C PO) Take 1 tablet by mouth daily.    [provider]  ?   ? ?Allergies    ?Macrobid [nitrofurantoin monohyd macro]   ? ?Review of Systems   ?Review of Systems  ?Unable to perform ROS: Mental status change  ? ?Physical Exam ?Updated Vital Signs ?BP 140/85   Pulse 66   Temp 98 ?F (36.7 ?C) (Oral)   Resp 15    SpO2 100%  ?Physical Exam ?Vitals and nursing note reviewed.  ?Constitutional:   ?   General: She is not in acute distress. ?   Appearance: She is well-developed.  ?HENT:  ?   Head: Normocephalic and atraumatic.  ?Eyes:  ?   Conjunctiva/sclera: Conjunctivae normal.  ?Cardiovascular:  ?   Rate and Rhythm: Normal rate and regular rhythm.  ?   Heart sounds: No murmur heard. ?Pulmonary:  ?   Effort: Pulmonary effort is normal. No respiratory distress.  ?   Breath sounds: Normal breath sounds.  ?Abdominal:  ?   Palpations: Abdomen is soft.  ?   Tenderness: There is no abdominal tenderness.  ?Musculoskeletal:     ?   General: No swelling.  ?   Cervical back: Neck supple.  ?Skin: ?   General: Skin is warm and dry.  ?   Capillary Refill: Capillary refill takes less than 2 seconds.  ?Neurological:  ?   Mental Status: She is alert.  ?Psychiatric:     ?   Mood and Affect: Mood normal.  ? ? ?ED Results / Procedures / Treatments   ?Labs ?(all labs ordered are listed, but only abnormal results are displayed) ?Labs Reviewed  ?COMPREHENSIVE METABOLIC PANEL - Abnormal; Notable for the following components:  ?    Result Value  ?  BUN 29 (*)   ? Calcium 10.4 (*)   ? All other components within normal limits  ?CBC WITH DIFFERENTIAL/PLATELET - Abnormal; Notable for the following components:  ? RBC 3.64 (*)   ? Hemoglobin 11.7 (*)   ? MCV 101.6 (*)   ? All other components within normal limits  ?ETHANOL  ?URINALYSIS, ROUTINE W REFLEX MICROSCOPIC  ?RAPID URINE DRUG SCREEN, HOSP PERFORMED  ?TROPONIN I (HIGH SENSITIVITY)  ?TROPONIN I (HIGH SENSITIVITY)  ? ? ?EKG ?None ? ?Radiology ?CT HEAD WO CONTRAST (5MM) ? ?Result Date: 01/05/2022 ?CLINICAL DATA:  Mental status change EXAM: CT HEAD WITHOUT CONTRAST TECHNIQUE: Contiguous axial images were obtained from the base of the skull through the vertex without intravenous contrast. RADIATION DOSE REDUCTION: This exam was performed according to the departmental dose-optimization program which includes  automated exposure control, adjustment of the mA and/or kV according to patient size and/or use of iterative reconstruction technique. COMPARISON:  CT brain 04/06/2018 FINDINGS: Brain: Large mass within the left frontal lobe measuring 4.2 x 3.8 x 2.9 cm with considerable surrounding edema and local mass effect. Sub fall seen herniation. About 11 mm midline shift to the right at the level of the mass. Mass effect on the anterior aspects of both lateral ventricles. Central hyperdensity within the mass could reflect a small amount of hemorrhage. Vascular: No hyperdense vessels.  Carotid vascular calcification Skull: Normal. Negative for fracture or focal lesion. Sinuses/Orbits: No acute finding. Other: None IMPRESSION: 1. Large 4.2 cm heterogenous mass within the left frontal lobe with slight increased internal density possibly due to a small amount of hemorrhage within the mass. Mass is associated with considerable edema and local mass effect. Positive for subfalcine herniation and about 11-12 mm midline shift to the right at the level of the mass. Compression of the anterior aspects of the lateral ventricles. Critical Value/emergent results were called by telephone at the time of interpretation on 01/05/2022 at 8:34 pm to provider [facility] , who verbally acknowledged these results. Electronically Signed   By: Donavan Foil M.D.   On: 01/05/2022 20:34  ? ?MR Brain W and Wo Contrast ? ?Result Date: 01/05/2022 ?CLINICAL DATA:  Initial evaluation for acute mental status change. EXAM: MRI HEAD WITHOUT AND WITH CONTRAST TECHNIQUE: Multiplanar, multiecho pulse sequences of the brain and surrounding structures were obtained without and with intravenous contrast. CONTRAST:  5.44m GADAVIST GADOBUTROL 1 MMOL/ML IV SOLN COMPARISON:  Prior CT from earlier the same day. FINDINGS: Brain: Cerebral volume within normal limits. Large heterogeneous intra-axial mass positioned at the anterior inferior left frontal lobe is seen,  measuring 4.2 x 4.0 x 4.5 cm in greatest transaxial dimensions. Lesion demonstrates heterogeneous T2/FLAIR signal intensity with marked heterogeneous postcontrast enhancement. Intrinsic T1 hyperintensity and susceptibility artifact consistent with blood products and/or necrosis. Extensive surrounding T2/FLAIR signal abnormality throughout the adjacent left frontal lobe, extending across the anterior genu of the corpus callosum, consistent with vasogenic edema and/or nonenhancing infiltrative tumor. Overlying dural thickening and enhancement noted at the adjacent anterior left frontal region (series 11, image 29). Associated regional mass effect with up to 2 cm of left-to-right shift anteriorly. Anterolateral ventricles are partially effaced. Finding most concerning for a primary CNS neoplasm, high-grade glioma/GBM. Otherwise, remainder the brain is relatively normal in appearance. No evidence for acute or subacute infarct. Gray-white matter differentiation otherwise maintained. No other areas of chronic cortical infarction. No other acute or chronic intracranial blood products. No other mass lesion or abnormal enhancement. No hydrocephalus or extra-axial fluid  collection. Vascular: Major intracranial vascular flow voids are maintained. Skull and upper cervical spine: Craniocervical junction within normal limits. Bone marrow signal intensity heterogeneous without focal marrow replacing lesion. Degenerative changes noted about the C1-2 articulation. No scalp soft tissue abnormality. Sinuses/Orbits: Prior bilateral ocular lens replacement. Mild mucosal thickening within the ethmoidal air cells. Small bilateral mastoid effusions, of doubtful significance. Other: None. IMPRESSION: 4.2 x 4.0 x 4.5 cm heterogeneous intra-axial mass at the anterior inferior left frontal lobe with associated regional mass effect and up to 2 cm of left-to-right shift anteriorly. 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 Electronically Signed   By: Jeannine Boga M.D.   On: 01/05/2022 23:11   ? ?Procedures ?Mar[facility] KitchenCritical Care ?Performed by: Lucrezia Starch, MD ?Authorized by: Lucrezia Starch, MD  ? ?Criti

## 2022-01-05 NOTE — Telephone Encounter (Signed)
[facility] - Client ?TELEPHONE ADVICE RECORD ?AccessNurse? ?Patient ?Name: ?[name] ?AN ?Gender: Female ?DOB: [birth year] ?Age: 88 Y 5 M 6 D ?Return ?Phone ?Number: ?[phone] ?(Primary), ?[id number] ?(Secondary) ?Address: ?City/ ?State/ ?Zip: ?[place] ?[address] ?Client [facility] - Client ?Client Site [facility] - Day ?Provider Eliezer Lofts - MD ?Contact Type Call ?Who Is Calling Patient / Member / Family / Caregiver ?Call Type Triage / Clinical ?Caller Name Adriene Knipfer ?Relationship To Patient Son ?Return Phone Number [phone] (Secondary) ?Chief Complaint CONFUSION - new onset ?Reason for Call Symptomatic / Request for Health Information ?Initial Comment Caller states she has a pt on the line son, stating ?she is acting out of character for 5 days and she ?has been getting up for work etc caller states he ?think she has a mini stroke. ?Translation No ?Nurse Assessment ?Nurse: Ysidro Evert, RN, Levada Dy Date/Time Eilene Ghazi Time): 01/05/2022 12:49:57 PM ?Confirm and document reason for call. If ?symptomatic, describe symptoms. ?---Caller states she is having some confusion for the ?past week. ?Does the patient have any new or worsening ?symptoms? ---Yes ?Will a triage be completed? ---Yes ?Related visit to physician within the last 2 weeks? ---No ?Does the PT have any chronic conditions? (i.e. ?diabetes, asthma, this includes High risk factors for ?pregnancy, etc.) ?---No ?Is this a behavioral health or substance abuse call? ---No ?Guidelines ?Guideline Title Affirmed Question Affirmed Notes Nurse Date/Time (Eastern ?Time) ?Confusion - Delirium [1] Acting confused ?(e.g., disoriented, ?slurred speech) AND ?[2] brief (now gone) ?Ysidro Evert, RN, Levada Dy 01/05/2022 12:51:14 ?PM ?Disp. Time (Eastern ?Time) Disposition Final User ?01/05/2022 12:43:05 PM Send to Urgent Queue Liana Crocker, Chloe-Jade ?PLEASE NOTE: All timestamps contained within this report are  represented as [place] Standard Time. ?CONFIDENTIALTY NOTICE: This fax transmission is intended only for the addressee. It contains information that is legally privileged, confidential or ?otherwise protected from use or disclosure. If you are not the intended recipient, you are strictly prohibited from reviewing, disclosing, copying using ?or disseminating any of this information or taking any action in reliance on or regarding this information. If you have received this fax in error, please ?notify us immediately by telephone so that we can arrange for its return to [place]. Phone: [phone], Toll-Free: [phone], Fax: [phone] ?Page: 2 of 2 ?Call Id: [id number] ?01/05/2022 1:01:59 PM See HCP within 4 Hours (or ?PCP triage) ?Yes Ysidro Evert, RN, Levada Dy ?Caller Disagree/Comply Disagree ?Caller Understands Yes ?PreDisposition Did not know what to do ?Care Advice Given Per Guideline ?SEE HCP (OR PCP TRIAGE) WITHIN 4 HOURS: * IF OFFICE WILL BE OPEN: You need to be seen within the next 3 or 4 ?hours. Call your doctor (or NP/PA) now or as soon as the office opens. CALL BACK IF: * You become worse CARE ADVICE ?given per Confusion-Delirium (Adult) guideline. ?Referrals ?GO TO FACILITY REFUSE ?

## 2022-01-05 NOTE — Telephone Encounter (Signed)
Agree with Dispo ?

## 2022-01-05 NOTE — ED Triage Notes (Signed)
Pt w son for c/o AMS & increased forgetfulness, fatigue/sleepiness x4-6 days, worsening since. No unilateral weakness or sensation difference noted in triage. Pt voices no concerns, states she's "not sure what's going on." ?

## 2022-01-06 ENCOUNTER — Other Ambulatory Visit: Payer: Self-pay

## 2022-01-06 ENCOUNTER — Encounter (HOSPITAL_COMMUNITY): Payer: Self-pay | Admitting: Internal Medicine

## 2022-01-06 DIAGNOSIS — R5383 Other fatigue: Secondary | ICD-10-CM | POA: Diagnosis present

## 2022-01-06 DIAGNOSIS — N390 Urinary tract infection, site not specified: Secondary | ICD-10-CM | POA: Diagnosis present

## 2022-01-06 DIAGNOSIS — Z79899 Other long term (current) drug therapy: Secondary | ICD-10-CM | POA: Diagnosis not present

## 2022-01-06 DIAGNOSIS — G936 Cerebral edema: Secondary | ICD-10-CM | POA: Diagnosis present

## 2022-01-06 DIAGNOSIS — Z803 Family history of malignant neoplasm of breast: Secondary | ICD-10-CM | POA: Diagnosis not present

## 2022-01-06 DIAGNOSIS — D496 Neoplasm of unspecified behavior of brain: Principal | ICD-10-CM

## 2022-01-06 DIAGNOSIS — R410 Disorientation, unspecified: Secondary | ICD-10-CM | POA: Diagnosis present

## 2022-01-06 DIAGNOSIS — Z8249 Family history of ischemic heart disease and other diseases of the circulatory system: Secondary | ICD-10-CM | POA: Diagnosis not present

## 2022-01-06 DIAGNOSIS — Z96641 Presence of right artificial hip joint: Secondary | ICD-10-CM | POA: Diagnosis present

## 2022-01-06 DIAGNOSIS — Z96652 Presence of left artificial knee joint: Secondary | ICD-10-CM | POA: Diagnosis present

## 2022-01-06 DIAGNOSIS — D539 Nutritional anemia, unspecified: Secondary | ICD-10-CM | POA: Diagnosis present

## 2022-01-06 DIAGNOSIS — Z800 Family history of malignant neoplasm of digestive organs: Secondary | ICD-10-CM | POA: Diagnosis not present

## 2022-01-06 DIAGNOSIS — E785 Hyperlipidemia, unspecified: Secondary | ICD-10-CM | POA: Diagnosis present

## 2022-01-06 DIAGNOSIS — G9389 Other specified disorders of brain: Secondary | ICD-10-CM | POA: Diagnosis present

## 2022-01-06 DIAGNOSIS — I499 Cardiac arrhythmia, unspecified: Secondary | ICD-10-CM | POA: Diagnosis present

## 2022-01-06 DIAGNOSIS — G935 Compression of brain: Secondary | ICD-10-CM | POA: Diagnosis present

## 2022-01-06 LAB — COMPREHENSIVE METABOLIC PANEL
ALT: 15 U/L (ref 0–44)
AST: 14 U/L — ABNORMAL LOW (ref 15–41)
Albumin: 3 g/dL — ABNORMAL LOW (ref 3.5–5.0)
Alkaline Phosphatase: 62 U/L (ref 38–126)
Anion gap: 8 (ref 5–15)
BUN: 27 mg/dL — ABNORMAL HIGH (ref 8–23)
CO2: 23 mmol/L (ref 22–32)
Calcium: 9.5 mg/dL (ref 8.9–10.3)
Chloride: 111 mmol/L (ref 98–111)
Creatinine, Ser: 0.6 mg/dL (ref 0.44–1.00)
GFR, Estimated: >60 mL/min (ref >60)
Glucose, Bld: 138 mg/dL — ABNORMAL HIGH (ref 70–99)
Potassium: 4.1 mmol/L (ref 3.5–5.1)
Sodium: 142 mmol/L (ref 135–145)
Total Bilirubin: 0.6 mg/dL (ref 0.3–1.2)
Total Protein: 6.7 g/dL (ref 6.5–8.1)

## 2022-01-06 LAB — RAPID URINE DRUG SCREEN, HOSP PERFORMED
Amphetamines: NOT DETECTED
Barbiturates: NOT DETECTED
Benzodiazepines: NOT DETECTED
Cocaine: NOT DETECTED
Opiates: NOT DETECTED
Tetrahydrocannabinol: NOT DETECTED

## 2022-01-06 LAB — URINALYSIS, ROUTINE W REFLEX MICROSCOPIC
Bacteria, UA: NONE SEEN
Bilirubin Urine: NEGATIVE
Glucose, UA: NEGATIVE mg/dL
Hgb urine dipstick: NEGATIVE
Ketones, ur: 5 mg/dL — AB
Nitrite: NEGATIVE
Protein, ur: NEGATIVE mg/dL
Specific Gravity, Urine: 1.025 (ref 1.005–1.030)
pH: 5 (ref 5.0–8.0)

## 2022-01-06 LAB — FOLATE: Folate: 21.1 ng/mL (ref >5.9)

## 2022-01-06 LAB — TSH: TSH: 0.938 u[IU]/mL (ref 0.350–4.500)

## 2022-01-06 LAB — VITAMIN B12: Vitamin B-12: 3861 pg/mL — ABNORMAL HIGH (ref 180–914)

## 2022-01-06 MED ORDER — ONDANSETRON HCL 4 MG PO TABS: 4.0000 mg | ORAL_TABLET | Freq: Four times a day (QID) | ORAL | Status: DC | PRN

## 2022-01-06 MED ORDER — LORATADINE 10 MG PO TABS
10.0000 mg | ORAL_TABLET | Freq: Every day | ORAL | Status: DC
  Administered 2022-01-06 – 2022-01-08 (×3): 10 mg via ORAL
  Filled 2022-01-06 (×3): qty 1

## 2022-01-06 MED ORDER — ACETAMINOPHEN 650 MG RE SUPP
650.0000 mg | Freq: Four times a day (QID) | RECTAL | Status: DC | PRN
Start: 2022-01-06 — End: 2022-01-09

## 2022-01-06 MED ORDER — ZINC SULFATE 220 (50 ZN) MG PO CAPS
220.0000 mg | ORAL_CAPSULE | Freq: Every day | ORAL | Status: DC
  Administered 2022-01-06 – 2022-01-08 (×3): 220 mg via ORAL
  Filled 2022-01-06 (×3): qty 1

## 2022-01-06 MED ORDER — ZINC GLUCONATE 50 MG PO TABS: 50.0000 mg | ORAL_TABLET | Freq: Every day | ORAL | Status: DC

## 2022-01-06 MED ORDER — ALBUTEROL SULFATE (2.5 MG/3ML) 0.083% IN NEBU: 2.5000 mg | INHALATION_SOLUTION | RESPIRATORY_TRACT | Status: DC | PRN

## 2022-01-06 MED ORDER — ONDANSETRON HCL 4 MG/2ML IJ SOLN: 4.0000 mg | Freq: Four times a day (QID) | INTRAMUSCULAR | Status: DC | PRN

## 2022-01-06 MED ORDER — SODIUM CHLORIDE 0.9 % IV SOLN
1.0000 g | INTRAVENOUS | Status: DC
Start: 2022-01-06 — End: 2022-01-09
  Administered 2022-01-06 – 2022-01-08 (×3): 1 g via INTRAVENOUS
  Filled 2022-01-06 (×3): qty 10

## 2022-01-06 MED ORDER — ACETAMINOPHEN 325 MG PO TABS: 650.0000 mg | ORAL_TABLET | Freq: Four times a day (QID) | ORAL | Status: DC | PRN

## 2022-01-06 MED ORDER — DEXAMETHASONE SODIUM PHOSPHATE 10 MG/ML IJ SOLN
10.0000 mg | Freq: Four times a day (QID) | INTRAMUSCULAR | Status: DC
  Administered 2022-01-06 – 2022-01-07 (×6): 10 mg via INTRAVENOUS
  Filled 2022-01-06 (×6): qty 1

## 2022-01-06 MED ORDER — ASCORBIC ACID 500 MG PO TABS
1000.0000 mg | ORAL_TABLET | Freq: Every day | ORAL | Status: DC
  Administered 2022-01-06 – 2022-01-08 (×3): 1000 mg via ORAL
  Filled 2022-01-06 (×3): qty 2

## 2022-01-06 MED ORDER — BIOTIN 10 MG PO TABS: 10.0000 mg | ORAL_TABLET | Freq: Every day | ORAL | Status: DC

## 2022-01-06 NOTE — Progress Notes (Addendum)
?PROGRESS NOTE ? ? ? ?Kathryn [name]  QZE:[id number] DOB: Apr 13, 1931 DOA: 01/05/2022 ?PCP: Jinny Sanders, MD ? ? ?Chief Complaint  ?Patient presents with  ? Altered Mental Status  ? ? ?Brief Narrative:  ?Patient is a 88 year old female history of hyperlipidemia, irregular heart rate, presented to the ED by son status post referral from PCP due to confusion, odd behavior from baseline with increased fatigue, progressive over the past 1 week.  Evaluation in the ED with head CT concerning for new brain mass/neoplasm with associated mass effect and edema.  MRI brain ordered.  Critical care consulted per ED and it was felt no need for ICU admission or critical care needs at this time.  Neurosurgery consulted. ? ? ?Assessment & Plan: ?  ?Principal Problem: ?  Neoplasm of brain causing mass effect on adjacent structures ([place]) ?Active Problems: ?  URINARY TRACT INFECTION, RECURRENT ?  Macrocytic anemia ? ?#1 neoplasm of the brain causing mass effect on adjacent structures ?-Patient presented to the ED with confusion, odd behavior from baseline. ?-CT head done with neoplasm of the brain with associated midline shift as well as edema noted. ?-Patient admitted awaiting progressive care bed. ?-Patient started on Decadron 10 mg IV every 6 hours. ?-MRI brain done ?-Neurosurgery consulted with a 4.2 x 4.0 x 4.5 cm heterogeneous intra-axial mass at the anterior inferior left frontal lobe with associated regional mass effect and up to 2 cm of left-to-right shift anteriorly.  Findings concerning for primary CNS neoplasm, high-grade glioma/GBM. ?-Patient seen in consultation by neurosurgery who are recommending continued IV steroids to help with edema/symptoms and also recommending biopsy as the ideal next step as opposed to craniectomy and resection. ?-Neurosurgery discussed with patient and neurosurgery will determine timing of biopsy and update accordingly. ?-We will also consult with palliative care for goals of care. ?-Per  neurosurgery. ? ?2.  Macrocytic anemia ?-Patient with no overt bleeding. ?-Hemoglobin stable. ?-Check an anemia panel. ?-Follow H&H. ?-Transfusion threshold hemoglobin < 7. ? ?3.  Probable UTI ?-Urinalysis hazy, moderate leukocytes, nitrite negative, WBC 21-50 ?-Check urine cultures. ?-Place on IV Rocephin. ? ? ? ?DVT prophylaxis: SCDs ?Code Status: Full ?Family Communication: Tried calling son, however phone went straight to voicemail. ?Disposition: TBD ? ?Status is: Inpatient ?Remains inpatient appropriate because: Severity of illness. ?  ?Consultants:  ?Neurosurgery: Dr. Zada Finders 01/06/2021 ? ?Procedures:  ?CT head 01/05/2022 ?MRI brain 01/05/2022 ? ? ?Antimicrobials:  ?IV Rocepin 01/06/2021 >>>>> ? ? ?Subjective: ?Just came from bathroom. No CP. NO sob.  Alert and oriented. ? ?Objective: ?Vitals:  ? 01/06/22 1510 01/06/22 1515 01/06/22 1630 01/06/22 1700  ?BP:  98/72 101/73 104/80  ?Pulse: 78 79 72 69  ?Resp: '15 16 17 17  '$ ?Temp:      ?TempSrc:      ?SpO2: 92% 96% 95% 95%  ? ? ?Intake/Output Summary (Last 24 hours) at 01/06/2022 1750 ?Last data filed at 01/06/2022 1056 ?Gross per 24 hour  ?Intake 97.14 ml  ?Output --  ?Net 97.14 ml  ? ?There were no vitals filed for this visit. ? ?Examination: ? ?General exam: Frail.  Cachectic.  No apparent distress. ?Respiratory system: Lungs clear to auscultation bilaterally.  No wheezes, no crackles, no rhonchi.  Normal respiratory effort.  Fair air movement.   ?Cardiovascular system: S1 & S2 heard, RRR. No JVD, murmurs, rubs, gallops or clicks. No pedal edema. ?Gastrointestinal system: Abdomen is nondistended, soft and nontender. No organomegaly or masses felt. Normal bowel sounds heard. ?Central nervous system: Alert and  oriented. No focal neurological deficits. ?Extremities: Symmetric 5 x 5 power. ?Skin: No rashes, lesions or ulcers ?Psychiatry: Judgement and insight appear fair. Mood & affect appropriate.  ? ? ? ?Data Reviewed: I have personally reviewed following labs and  imaging studies ? ?CBC: ?Recent Labs  ?Lab 01/05/22 ?1919  ?WBC 8.0  ?NEUTROABS 6.5  ?HGB 11.7*  ?HCT 37.0  ?MCV 101.6*  ?PLT 322  ? ? ?Basic Metabolic Panel: ?Recent Labs  ?Lab 01/05/22 ?1919 01/06/22 ?0418  ?NA 142 142  ?K 3.8 4.1  ?CL 108 111  ?CO2 27 23  ?GLUCOSE 97 138*  ?BUN 29* 27*  ?CREATININE 0.74 0.60  ?CALCIUM 10.4* 9.5  ? ? ?GFR: ?CrCl cannot be calculated (Unknown ideal weight.). ? ?Liver Function Tests: ?Recent Labs  ?Lab 01/05/22 ?1919 01/06/22 ?0418  ?AST 20 14*  ?ALT 15 15  ?ALKPHOS 74 62  ?BILITOT 0.6 0.6  ?PROT 7.6 6.7  ?ALBUMIN 3.6 3.0*  ? ? ?CBG: ?No results for input(s): GLUCAP in the last 168 hours. ? ? ?No results found for this or any previous visit (from the past 240 hour(s)).  ? ? ? ? ? ?Radiology Studies: ?CT HEAD WO CONTRAST (5MM) ? ?Result Date: 01/05/2022 ?CLINICAL DATA:  Mental status change EXAM: CT HEAD WITHOUT CONTRAST TECHNIQUE: Contiguous axial images were obtained from the base of the skull through the vertex without intravenous contrast. RADIATION DOSE REDUCTION: This exam was performed according to the departmental dose-optimization program which includes automated exposure control, adjustment of the mA and/or kV according to patient size and/or use of iterative reconstruction technique. COMPARISON:  CT brain 04/06/2018 FINDINGS: Brain: Large mass within the left frontal lobe measuring 4.2 x 3.8 x 2.9 cm with considerable surrounding edema and local mass effect. Sub fall seen herniation. About 11 mm midline shift to the right at the level of the mass. Mass effect on the anterior aspects of both lateral ventricles. Central hyperdensity within the mass could reflect a small amount of hemorrhage. Vascular: No hyperdense vessels.  Carotid vascular calcification Skull: Normal. Negative for fracture or focal lesion. Sinuses/Orbits: No acute finding. Other: None IMPRESSION: 1. Large 4.2 cm heterogenous mass within the left frontal lobe with slight increased internal density possibly  due to a small amount of hemorrhage within the mass. Mass is associated with considerable edema and local mass effect. Positive for subfalcine herniation and about 11-12 mm midline shift to the right at the level of the mass. Compression of the anterior aspects of the lateral ventricles. Critical Value/emergent results were called by telephone at the time of interpretation on 01/05/2022 at 8:34 pm to provider [facility] , who verbally acknowledged these results. Electronically Signed   By: Donavan Foil M.D.   On: 01/05/2022 20:34  ? ?MR Brain W and Wo Contrast ? ?Result Date: 01/05/2022 ?CLINICAL DATA:  Initial evaluation for acute mental status change. EXAM: MRI HEAD WITHOUT AND WITH CONTRAST TECHNIQUE: Multiplanar, multiecho pulse sequences of the brain and surrounding structures were obtained without and with intravenous contrast. CONTRAST:  5.91m GADAVIST GADOBUTROL 1 MMOL/ML IV SOLN COMPARISON:  Prior CT from earlier the same day. FINDINGS: Brain: Cerebral volume within normal limits. Large heterogeneous intra-axial mass positioned at the anterior inferior left frontal lobe is seen, measuring 4.2 x 4.0 x 4.5 cm in greatest transaxial dimensions. Lesion demonstrates heterogeneous T2/FLAIR signal intensity with marked heterogeneous postcontrast enhancement. Intrinsic T1 hyperintensity and susceptibility artifact consistent with blood products and/or necrosis. Extensive surrounding T2/FLAIR signal abnormality throughout the adjacent left  frontal lobe, extending across the anterior genu of the corpus callosum, consistent with vasogenic edema and/or nonenhancing infiltrative tumor. Overlying dural thickening and enhancement noted at the adjacent anterior left frontal region (series 11, image 29). Associated regional mass effect with up to 2 cm of left-to-right shift anteriorly. Anterolateral ventricles are partially effaced. Finding most concerning for a primary CNS neoplasm, high-grade glioma/GBM. Otherwise,  remainder the brain is relatively normal in appearance. No evidence for acute or subacute infarct. Gray-white matter differentiation otherwise maintained. No other areas of chronic cortical infarction. No other a

## 2022-01-06 NOTE — Consult Note (Signed)
Neurosurgery Consultation ? ?Reason for Consult: Confusion / brain tumor ?Referring Physician: Grandville Silos ? ?CC: confusion ? ?HPI: This is a 88 y.o. woman, high functioning at baseline - lives with her son and has some assistance with ADLs, that presents with family noticing some confusion, pt unaware of the confusion but does feel a little off her baseline cognitively. No new weakness, numbness, or parasthesias, no recent change in bowel or bladder function. No recent use of anti-platelet or anti-coagulant medications. ? ? ?ROS: A 14 point ROS was performed and is negative except as noted in the HPI.  ? ?PMHx:  ?Past Medical History:  ?Diagnosis Date  ? Allergy   ? Arthritis   ? Colon polyps   ? History of blood transfusion   ? with previous surgeries  ? Hyperlipidemia   ? Irregular heart rate   ? PONV (postoperative nausea and vomiting)   ? hx of pt. states she thinks was from pain med.  ? ?FamHx:  ?Family History  ?Problem Relation Age of Onset  ? Pancreatic cancer Mother   ? Hypertension Sister   ? Heart disease Sister   ? Breast cancer Sister   ?     Age 29's  ? Heart attack Brother   ? Heart disease Brother   ? Hypertension Brother   ? Bone cancer Sister   ? Heart disease Brother   ? Colon cancer Neg Hx   ? Colon polyps Neg Hx   ? Esophageal cancer Neg Hx   ? Diabetes Neg Hx   ? Kidney disease Neg Hx   ? Gallbladder disease Neg Hx   ? ?SocHx:  reports that she has never smoked. She has never used smokeless tobacco. She reports current alcohol use of about 3.0 standard drinks per week. She reports that she does not use drugs. ? ?Exam: ?Vital signs in last 24 hours: ?Temp:  [97.6 ?F (36.4 ?C)-98 ?F (36.7 ?C)] 98 ?F (36.7 ?C) (04/11 2016) ?Pulse Rate:  [62-106] 87 (04/12 0900) ?Resp:  [12-31] 16 (04/12 0900) ?BP: (97-160)/(69-114) 130/92 (04/12 0900) ?SpO2:  [92 %-100 %] 100 % (04/12 0900) ?General: Awake, alert, cooperative, lying in bed in NAD ?Head: Normocephalic and atruamatic ?HEENT: Neck supple ?Pulmonary:  breathing room air comfortably, no evidence of increased work of breathing ?Cardiac: RRR ?Abdomen: S NT ND ?Extremities: Warm and well perfused x4, decreased bulk but appropriate for an 88 year old woman ?Neuro: AOx3, PERRL, EOMI, FS ?Strength 5/5 x4, SILTx4 ? ? ?Assessment and Plan: 88 y.o. woman with progressive confusion. CT and MRI personally reviewed, which show left frontal enhancing mass with brain compression, large area of surrounding vasogenic edema, concerning for primary brain tumor.  ? ?-discussed with the patient this morning, she is aware of the diagnosis of a likely brain tumor of uncertain type. Recommend continued IV steroids to help with edema / symptoms. Given her age and clinically frail appearance, I think a biopsy is the ideal next step as opposed to craniotomy and resection. I discussed this with her, will have to determine timing and update accordingly.  ?-please call with any concerns or questions ? ?Judith Part, MD ?01/06/22 ?10:11 AM ?[facility] ? ?

## 2022-01-06 NOTE — H&P (Signed)
?History and Physical  ? ? ?Kathryn [name]:[id number] DOB: [birth year] DOA: 01/05/2022 ? ?PCP: Jinny Sanders, MD  ?Patient coming from: home ? ?I have personally briefly reviewed patient's old medical records in [place] ? ?Chief Complaint: confusion ? ?HPI: Kathryn [name] is a 88 y.o. female with medical history significant of HLD,Irregular heart rate  who present to ed brought in by son s/p referral from pcp due to noted confusion /odd behavior from baseline, increase fatigue progressive over the last one week.  ON evaluation in ED patient noted no complaint states she feels well. On ros denies f/c/n/v/cough / HA/vision changes, focal weakness, weight loss or decrease appetite.  ?Of note on evaluation patient found to have new brain mass concerning for neoplasm with associated mass effect and edema. ? ? ?ED Course:  ?Vitals ?Afeb, bp 158/84, hr 90, rr 16  ?Neurosugery Dr. Venetia Constable was consulted who recommended  decadron 10 mg  q6h ,MRI with plans for further evaluation in am re- whether patient is a candidate for any surgical therapy  or further evaluation with biopsy. ?Critical care also consulted and reviewed chart and noted no need for ICU admission or critical care needs ? ?Notable labs: ?Wbc 8, hgb 11.7 at baseline , mcv 101.6 , NA 142, K 3.8, cr 0.74,  ? ?Ct head  ?1. Large 4.2 cm heterogenous mass within the left frontal lobe with ?slight increased internal density possibly due to a small amount of ?hemorrhage within the mass. Mass is associated with considerable ?edema and local mass effect. Positive for subfalcine herniation and ?about 11-12 mm midline shift to the right at the level of the mass. ?Compression of the anterior aspects of the lateral ventricles. ?  ?MRI brain  ?IMPRESSION: ?4.2 x 4.0 x 4.5 cm heterogeneous intra-axial mass at the anterior ?inferior left frontal lobe with associated regional mass effect and ?up to 2 cm of left-to-right shift anteriorly. Finding most ?concerning for  a primary CNS neoplasm, high-grade glioma/GBM. ? ? ?Review of Systems: As per HPI otherwise 10 point review of systems negative.  ? ?Past Medical History:  ?Diagnosis Date  ? Allergy   ? Arthritis   ? Colon polyps   ? History of blood transfusion   ? with previous surgeries  ? Hyperlipidemia   ? Irregular heart rate   ? PONV (postoperative nausea and vomiting)   ? hx of pt. states she thinks was from pain med.  ? ? ?Past Surgical History:  ?Procedure Laterality Date  ? CARDIAC CATHETERIZATION    ? HAND SURGERY Left   ? TOTAL HIP ARTHROPLASTY  2007  ? LEFT  ? TOTAL HIP ARTHROPLASTY Right 10/13/2016  ? Procedure: RIGHT TOTAL HIP ARTHROPLASTY ANTERIOR APPROACH;  Surgeon: Gaynelle Arabian, MD;  Location: [facility];  Service: Orthopedics;  Laterality: Right;  ? TOTAL KNEE ARTHROPLASTY    ? Right   ? TOTAL KNEE ARTHROPLASTY Left 11/24/2020  ? Procedure: TOTAL KNEE ARTHROPLASTY;  Surgeon: Gaynelle Arabian, MD;  Location: [facility];  Service: Orthopedics;  Laterality: Left;  26mn  ? TUBAL LIGATION    ? ? ? reports that she has never smoked. She has never used smokeless tobacco. She reports current alcohol use of about 3.0 standard drinks per week. She reports that she does not use drugs. ? ?Allergies  ?Allergen Reactions  ? Macrobid [[employer]]   ?  Made pt sick to per stomach and shaky  ? ? ?Family History  ?Problem Relation Age of  Onset  ? Pancreatic cancer Mother   ? Hypertension Sister   ? Heart disease Sister   ? Breast cancer Sister   ?     Age 76's  ? Heart attack Brother   ? Heart disease Brother   ? Hypertension Brother   ? Bone cancer Sister   ? Heart disease Brother   ? Colon cancer Neg Hx   ? Colon polyps Neg Hx   ? Esophageal cancer Neg Hx   ? Diabetes Neg Hx   ? Kidney disease Neg Hx   ? Gallbladder disease Neg Hx   ? ? ?Prior to Admission medications   ?Medication Sig Start Date End Date Taking? Authorizing Provider  ?Calcium Carbonate-Vit D-Min (CALCIUM 1200 PO) Take 2 tablets by mouth daily.     [provider]  ?cetirizine (ZYRTEC) 10 MG tablet TAKE 1 TABLET DAILY 12/24/20   Bedsole, Amy E, MD  ?Cyanocobalamin (VITAMIN B-12 PO) Take 1 tablet by mouth daily.    [provider]  ?fluticasone (FLONASE) 50 MCG/ACT nasal spray USE 2 SPRAYS IN EACH NOSTRIL DAILY 11/12/21   Bedsole, Amy E, MD  ?methocarbamol (ROBAXIN) 500 MG tablet Take 1 tablet (500 mg total) by mouth every 6 (six) hours as needed for muscle spasms. 11/25/20   Edmisten, Ok Anis, PA  ?vitamin C (ASCORBIC ACID) 500 MG tablet Take 500 mg by mouth daily.    [provider]  ?Zinc Oxide-Vitamin C (ZINC PLUS VITAMIN C PO) Take 1 tablet by mouth daily.    [provider]  ? ? ?Physical Exam: ?Vitals:  ? 01/06/22 0230 01/06/22 0245 01/06/22 0300 01/06/22 0315  ?BP: (!) 152/81 114/71 128/85 116/78  ?Pulse: 69 71 75 76  ?Resp: 13 (!) '23 17 15  '$ ?Temp:      ?TempSrc:      ?SpO2: 98% 96% 96% 95%  ? ? ?Constitutional: NAD, calm, comfortable ?Vitals:  ? 01/06/22 0230 01/06/22 0245 01/06/22 0300 01/06/22 0315  ?BP: (!) 152/81 114/71 128/85 116/78  ?Pulse: 69 71 75 76  ?Resp: 13 (!) '23 17 15  '$ ?Temp:      ?TempSrc:      ?SpO2: 98% 96% 96% 95%  ?Constitutional: NAD, calm, comfortable ?Eyes: PERRL, lids and conjunctivae normal ?ENMT: Mucous membranes are moist. Posterior pharynx clear of any exudate or lesions.Normal dentition.  ?Neck: normal, supple, no masses, no thyromegaly ?Respiratory: clear to auscultation bilaterally, no wheezing, no crackles. Normal respiratory effort. No accessory muscle use.  ?Cardiovascular: Regular rate and rhythm, no murmurs / rubs / gallops. No extremity edema. 2+ pedal pulses. No carotid bruits.  ?Abdomen: no tenderness, no masses palpated. No hepatosplenomegaly. Bowel sounds positive.  ?Musculoskeletal: no clubbing / cyanosis. No joint deformity upper and lower extremities. Good ROM, no contractures. Normal muscle tone.  ?Skin: no rashes, lesions, ulcers. No induration ?Neurologic: CN 2-12 grossly  intact. Sensation intact,l. Strength 5/5 in all 4.  ?Psychiatric: Normal judgment and insight. Alert and oriented x 3. Normal mood.  ? ? ?Labs on Admission: I have personally reviewed following labs and imaging studies ? ?CBC: ?Recent Labs  ?Lab 01/05/22 ?1919  ?WBC 8.0  ?NEUTROABS 6.5  ?HGB 11.7*  ?HCT 37.0  ?MCV 101.6*  ?PLT 322  ? ?Basic Metabolic Panel: ?Recent Labs  ?Lab 01/05/22 ?1919  ?NA 142  ?K 3.8  ?CL 108  ?CO2 27  ?GLUCOSE 97  ?BUN 29*  ?CREATININE 0.74  ?CALCIUM 10.4*  ? ?GFR: ?CrCl cannot be calculated (Unknown ideal weight.). ?Liver Function  Tests: ?Recent Labs  ?Lab 01/05/22 ?1919  ?AST 20  ?ALT 15  ?ALKPHOS 74  ?BILITOT 0.6  ?PROT 7.6  ?ALBUMIN 3.6  ? ?No results for input(s): LIPASE, AMYLASE in the last 168 hours. ?No results for input(s): AMMONIA in the last 168 hours. ?Coagulation Profile: ?No results for input(s): INR, PROTIME in the last 168 hours. ?Cardiac Enzymes: ?No results for input(s): CKTOTAL, CKMB, CKMBINDEX, TROPONINI in the last 168 hours. ?BNP (last 3 results) ?No results for input(s): PROBNP in the last 8760 hours. ?HbA1C: ?No results for input(s): HGBA1C in the last 72 hours. ?CBG: ?No results for input(s): GLUCAP in the last 168 hours. ?Lipid Profile: ?No results for input(s): CHOL, HDL, LDLCALC, TRIG, CHOLHDL, LDLDIRECT in the last 72 hours. ?Thyroid Function Tests: ?No results for input(s): TSH, T4TOTAL, FREET4, T3FREE, THYROIDAB in the last 72 hours. ?Anemia Panel: ?No results for input(s): VITAMINB12, FOLATE, FERRITIN, TIBC, IRON, RETICCTPCT in the last 72 hours. ?Urine analysis: ?   ?Component Value Date/Time  ? COLORURINE YELLOW 10/04/2016 1335  ? APPEARANCEUR CLEAR 10/04/2016 1335  ? LABSPEC 1.020 10/04/2016 1335  ? PHURINE 6.0 10/04/2016 1335  ? GLUCOSEU NEGATIVE 10/04/2016 1335  ? [place] NEGATIVE 10/04/2016 1335  ? HGBUR moderate 12/26/2009 1037  ? [place] NEGATIVE 10/04/2016 1335  ? BILIRUBINUR neg 02/04/2016 1519  ? KETONESUR 5 (A) 10/04/2016 1335  ? PROTEINUR  NEGATIVE 10/04/2016 1335  ? UROBILINOGEN negative 02/04/2016 1519  ? UROBILINOGEN 0.2 06/19/2013 1156  ? NITRITE NEGATIVE 10/04/2016 1335  ? LEUKOCYTESUR NEGATIVE 10/04/2016 1335  ? ? ?Radiological Exams on Admis

## 2022-01-07 ENCOUNTER — Other Ambulatory Visit: Payer: Self-pay | Admitting: Radiation Therapy

## 2022-01-07 DIAGNOSIS — G9389 Other specified disorders of brain: Secondary | ICD-10-CM | POA: Diagnosis not present

## 2022-01-07 DIAGNOSIS — C719 Malignant neoplasm of brain, unspecified: Secondary | ICD-10-CM

## 2022-01-07 DIAGNOSIS — N390 Urinary tract infection, site not specified: Secondary | ICD-10-CM | POA: Diagnosis not present

## 2022-01-07 DIAGNOSIS — D539 Nutritional anemia, unspecified: Secondary | ICD-10-CM | POA: Diagnosis not present

## 2022-01-07 DIAGNOSIS — D496 Neoplasm of unspecified behavior of brain: Secondary | ICD-10-CM | POA: Diagnosis not present

## 2022-01-07 LAB — CBC
HCT: 33.5 % — ABNORMAL LOW (ref 36.0–46.0)
Hemoglobin: 11.3 g/dL — ABNORMAL LOW (ref 12.0–15.0)
MCH: 33 pg (ref 26.0–34.0)
MCHC: 33.7 g/dL (ref 30.0–36.0)
MCV: 98 fL (ref 80.0–100.0)
Platelets: 288 10*3/uL (ref 150–400)
RBC: 3.42 MIL/uL — ABNORMAL LOW (ref 3.87–5.11)
RDW: 13.3 % (ref 11.5–15.5)
WBC: 11.6 10*3/uL — ABNORMAL HIGH (ref 4.0–10.5)
nRBC: 0 % (ref 0.0–0.2)

## 2022-01-07 LAB — RENAL FUNCTION PANEL
Albumin: 2.8 g/dL — ABNORMAL LOW (ref 3.5–5.0)
Anion gap: 6 (ref 5–15)
BUN: 40 mg/dL — ABNORMAL HIGH (ref 8–23)
CO2: 22 mmol/L (ref 22–32)
Calcium: 9.3 mg/dL (ref 8.9–10.3)
Chloride: 111 mmol/L (ref 98–111)
Creatinine, Ser: 0.84 mg/dL (ref 0.44–1.00)
GFR, Estimated: >60 mL/min (ref >60)
Glucose, Bld: 154 mg/dL — ABNORMAL HIGH (ref 70–99)
Phosphorus: 3.3 mg/dL (ref 2.5–4.6)
Potassium: 4.5 mmol/L (ref 3.5–5.1)
Sodium: 139 mmol/L (ref 135–145)

## 2022-01-07 LAB — URINE CULTURE

## 2022-01-07 LAB — MAGNESIUM: Magnesium: 2 mg/dL (ref 1.7–2.4)

## 2022-01-07 MED ORDER — DEXAMETHASONE SODIUM PHOSPHATE 4 MG/ML IJ SOLN
4.0000 mg | Freq: Two times a day (BID) | INTRAMUSCULAR | Status: DC
  Administered 2022-01-07 – 2022-01-08 (×2): 4 mg via INTRAVENOUS
  Filled 2022-01-07 (×2): qty 1

## 2022-01-07 NOTE — Progress Notes (Addendum)
?PROGRESS NOTE ? ? ? ?Kathryn [name]  ZOX:[id number] DOB: [birth year] DOA: 01/05/2022 ?PCP: Jinny Sanders, MD ? ? ?Chief Complaint  ?Patient presents with  ? Altered Mental Status  ? ? ?Brief Narrative:  ?Patient is a 88 year old female history of hyperlipidemia, irregular heart rate, presented to the ED by son status post referral from PCP due to confusion, odd behavior from baseline with increased fatigue, progressive over the past 1 week.  Evaluation in the ED with head CT concerning for new brain mass/neoplasm with associated mass effect and edema.  MRI brain ordered.  Critical care consulted per ED and it was felt no need for ICU admission or critical care needs at this time.  Neurosurgery consulted. ? ? ?Assessment & Plan: ?  ?Principal Problem: ?  Neoplasm of brain causing mass effect on adjacent structures ([place]) ?Active Problems: ?  URINARY TRACT INFECTION, RECURRENT ?  Macrocytic anemia ? ?#1 neoplasm of the brain causing mass effect on adjacent structures ?-Patient presented to the ED with confusion, odd behavior from baseline. ?-CT head done with neoplasm of the brain with associated midline shift as well as edema noted. ?-Patient admitted awaiting progressive care bed. ?-Patient started on Decadron 10 mg IV every 6 hours. ?-MRI brain done with a 4.2 x 4.0 x 4.5 cm heterogeneous intra-axial mass at the anterior inferior left frontal lobe with associated regional mass effect and up to 2 cm of left-to-right shift anteriorly.  Findings concerning for primary CNS neoplasm, high-grade glioma/GBM. ?-Patient seen in consultation by neurosurgery who are recommending continued IV steroids to help with edema/symptoms and also recommending biopsy as the ideal next step as opposed to craniectomy and resection. ?-Neurosurgery discussed with patient and neurosurgery will determine timing of biopsy and update accordingly. ?-Palliative care consultation placed for goals of care. ?-Per neurosurgery. ? ?2.  Macrocytic  anemia ?-Patient with no overt bleeding. ?-Hemoglobin stable. ?-Folate of 21.1, vitamin B12 of 3861.  Iron and ferritin ordered however do not see pending and as such we will order in the AM. ?-Follow H&H. ?-Transfusion threshold hemoglobin < 7. ? ?3.  Probable UTI ?-Urinalysis hazy, moderate leukocytes, nitrite negative, WBC 21-50. ?-Urine cultures pending. ?-Continue IV Rocephin day 2/3-5. ? ? ? ?DVT prophylaxis: SCDs ?Code Status: Full ?Family Communication: Updated son on telephone.  ?Disposition: TBD ? ?Status is: Inpatient ?Remains inpatient appropriate because: Severity of illness. ?  ?Consultants:  ?Neurosurgery: Dr. Zada Finders 01/06/2021 ? ?Procedures:  ?CT head 01/05/2022 ?MRI brain 01/05/2022 ? ? ?Antimicrobials:  ?IV Rocepin 01/06/2021 >>>>> ? ? ?Subjective: ?Sitting up in bed getting ready to eat her breakfast.  No chest pain.  No shortness of breath.  Alert to self and knows she is in [facility].  Unsure of the year.  Unsure of the month thinks it September.  States she is in the place where your blood pressure gets checked.  No other complaints. ? ?Objective: ?Vitals:  ? 01/06/22 2137 01/06/22 2306 01/07/22 0309 01/07/22 0749  ?BP:  131/72 (!) 144/88 (!) 142/86  ?Pulse: 63 65 67 78  ?Resp: '16 18 19 18  '$ ?Temp: 97.6 ?F (36.4 ?C) 98 ?F (36.7 ?C) 97.8 ?F (36.6 ?C) 97.7 ?F (36.5 ?C)  ?TempSrc: Oral Oral Oral Oral  ?SpO2: 96% 95% 95% 96%  ? ? ?Intake/Output Summary (Last 24 hours) at 01/07/2022 0945 ?Last data filed at 01/06/2022 1056 ?Gross per 24 hour  ?Intake 97.14 ml  ?Output --  ?Net 97.14 ml  ? ? ?There were no vitals filed  for this visit. ? ?Examination: ? ?General exam: Cachectic.  Frail.  NAD. ?Respiratory system: Clear to auscultation bilaterally.  No wheezes, no crackles, no rhonchi.  Speaking in full sentences.  Normal respiratory effort.  ?Cardiovascular system: Regular rate rhythm no murmurs rubs or gallops.  No JVD.  No lower extremity edema. ?Gastrointestinal system: Abdomen is  nondistended, soft and nontender. No organomegaly or masses felt. Normal bowel sounds heard. ?Central nervous system: Alert and oriented to self and knows she is in [place].  Moving extremities spontaneously. ?Extremities: Symmetric 5 x 5 power. ?Skin: No rashes, lesions or ulcers ?Psychiatry: Judgement and insight appear fair. Mood & affect appropriate.  ? ? ? ?Data Reviewed: I have personally reviewed following labs and imaging studies ? ?CBC: ?Recent Labs  ?Lab 01/05/22 ?1919 01/07/22 ?0305  ?WBC 8.0 11.6*  ?NEUTROABS 6.5  --   ?HGB 11.7* 11.3*  ?HCT 37.0 33.5*  ?MCV 101.6* 98.0  ?PLT 322 288  ? ? ? ?Basic Metabolic Panel: ?Recent Labs  ?Lab 01/05/22 ?1919 01/06/22 ?7017 01/07/22 ?0305  ?NA 142 142 139  ?K 3.8 4.1 4.5  ?CL 108 111 111  ?CO2 '27 23 22  '$ ?GLUCOSE 97 138* 154*  ?BUN 29* 27* 40*  ?CREATININE 0.74 0.60 0.84  ?CALCIUM 10.4* 9.5 9.3  ?MG  --   --  2.0  ?PHOS  --   --  3.3  ? ? ? ?GFR: ?CrCl cannot be calculated (Unknown ideal weight.). ? ?Liver Function Tests: ?Recent Labs  ?Lab 01/05/22 ?1919 01/06/22 ?7939 01/07/22 ?0305  ?AST 20 14*  --   ?ALT 15 15  --   ?ALKPHOS 74 62  --   ?BILITOT 0.6 0.6  --   ?PROT 7.6 6.7  --   ?ALBUMIN 3.6 3.0* 2.8*  ? ? ? ?CBG: ?No results for input(s): GLUCAP in the last 168 hours. ? ? ?Recent Results (from the past 240 hour(s))  ?Urine Culture     Status: Abnormal  ? Collection Time: 01/06/22 10:27 AM  ? Specimen: Urine, Clean Catch  ?Result Value Ref Range Status  ? Specimen Description URINE, CLEAN CATCH  Final  ? Special Requests   Final  ?  NONE ?Performed at [facility], [place] [address], [place], [place] [address] ?  ? Culture MULTIPLE SPECIES PRESENT, SUGGEST RECOLLECTION (A)  Final  ? Report Status 01/07/2022 FINAL  Final  ?  ? ? ? ? ? ?Radiology Studies: ?CT HEAD WO CONTRAST (5MM) ? ?Result Date: 01/05/2022 ?CLINICAL DATA:  Mental status change EXAM: CT HEAD WITHOUT CONTRAST TECHNIQUE: Contiguous axial images were obtained from the base of the skull through  the vertex without intravenous contrast. RADIATION DOSE REDUCTION: This exam was performed according to the departmental dose-optimization program which includes automated exposure control, adjustment of the mA and/or kV according to patient size and/or use of iterative reconstruction technique. COMPARISON:  CT brain 04/06/2018 FINDINGS: Brain: Large mass within the left frontal lobe measuring 4.2 x 3.8 x 2.9 cm with considerable surrounding edema and local mass effect. Sub fall seen herniation. About 11 mm midline shift to the right at the level of the mass. Mass effect on the anterior aspects of both lateral ventricles. Central hyperdensity within the mass could reflect a small amount of hemorrhage. Vascular: No hyperdense vessels.  Carotid vascular calcification Skull: Normal. Negative for fracture or focal lesion. Sinuses/Orbits: No acute finding. Other: None IMPRESSION: 1. Large 4.2 cm heterogenous mass within the left frontal lobe with slight increased internal density  possibly due to a small amount of hemorrhage within the mass. Mass is associated with considerable edema and local mass effect. Positive for subfalcine herniation and about 11-12 mm midline shift to the right at the level of the mass. Compression of the anterior aspects of the lateral ventricles. Critical Value/emergent results were called by telephone at the time of interpretation on 01/05/2022 at 8:34 pm to provider [facility] , who verbally acknowledged these results. Electronically Signed   By: Donavan Foil M.D.   On: 01/05/2022 20:34  ? ?MR Brain W and Wo Contrast ? ?Result Date: 01/05/2022 ?CLINICAL DATA:  Initial evaluation for acute mental status change. EXAM: MRI HEAD WITHOUT AND WITH CONTRAST TECHNIQUE: Multiplanar, multiecho pulse sequences of the brain and surrounding structures were obtained without and with intravenous contrast. CONTRAST:  5.67m GADAVIST GADOBUTROL 1 MMOL/ML IV SOLN COMPARISON:  Prior CT from earlier the same  day. FINDINGS: Brain: Cerebral volume within normal limits. Large heterogeneous intra-axial mass positioned at the anterior inferior left frontal lobe is seen, measuring 4.2 x 4.0 x 4.5 cm in greatest transaxial

## 2022-01-07 NOTE — Progress Notes (Signed)
Palliative: ? ?Briefly met with patient - would like son to be involved in goals of care discussion however he has left hospital for the day, went home to sleep. Noted there are plans for discharge today/tomorrow with plans for patient to follow up with Dr. Mickeal Skinner next week. Discussed with Dr. Grandville Silos - will refer to palliative care team in cancer center for goals of care discussions. ? ?Juel Burrow, DNP, AGNP-C ?Palliative Medicine Team ?Team Phone # [phone]  ?Pager # [phone] ? ?NO CHARGE ?

## 2022-01-07 NOTE — Evaluation (Signed)
Physical Therapy Evaluation ?Patient Details ?Name: Kathryn [name] ?MRN: [id number] ?DOB: [birth year] ?Today's Date: 01/07/2022 ? ?History of Present Illness ? Kathryn [name] is a 88 y.o. female with medical history significant of HLD, Irregular heart rate admitted due to AMS, fatigue found to have new L frontal brain mass with up to 2cm L to R shift concerning for primary CNS neoplasm.  ?Clinical Impression ? Patient presents with decreased mobility due to decreased balance, decreased safety/deficit awareness, decreased general strength.  Currently min to minguard A for ambulation in hallway, but was previously walking 2 miles a day per son.  Feel she will need 24 hour supervision for safety at d/c as currently demonstrating fall risk.  PT will continue to follow acutely and recommend HHPT at d/c.  ?   ? ?Recommendations for follow up therapy are one component of a multi-disciplinary discharge planning process, led by the attending physician.  Recommendations may be updated based on patient status, additional functional criteria and insurance authorization. ? ?Follow Up Recommendations Home health PT ? ?  ?Assistance Recommended at Discharge Frequent or constant Supervision/Assistance  ?Patient can return home with the following ? A little help with walking and/or transfers;A little help with bathing/dressing/bathroom;Direct supervision/assist for medications management;Assist for transportation;Assistance with cooking/housework;Help with stairs or ramp for entrance;Direct supervision/assist for financial management ? ?  ?Equipment Recommendations None recommended by PT  ?Recommendations for Other Services ?    ?  ?Functional Status Assessment Patient has had a recent decline in their functional status and demonstrates the ability to make significant improvements in function in a reasonable and predictable amount of time.  ? ?  ?Precautions / Restrictions Precautions ?Precautions: Fall  ? ?  ? ?Mobility ? Bed  Mobility ?Overal bed mobility: Modified Independent ?  ?  ?  ?  ?  ?  ?  ?  ? ?Transfers ?Overall transfer level: Needs assistance ?  ?Transfers: Sit to/from Stand ?Sit to Stand: Min guard ?  ?  ?  ?  ?  ?General transfer comment: for balance ?  ? ?Ambulation/Gait ?Ambulation/Gait assistance: Min guard, Min assist ?Gait Distance (Feet): 150 Feet ?Assistive device: None ?Gait Pattern/deviations: Step-through pattern, Decreased stride length, Wide base of support, Drifts right/left, Trunk flexed ?  ?  ?  ?General Gait Details: assist for balance some lateral drifting and noted some toe hyperextension with flexed posture likely needs her shoes ? ?Stairs ?  ?  ?  ?  ?  ? ?Wheelchair Mobility ?  ? ?Modified Rankin (Stroke Patients Only) ?  ? ?  ? ?Balance Overall balance assessment: Needs assistance ?Sitting-balance support: Feet supported ?Sitting balance-Leahy Scale: Good ?Sitting balance - Comments: reaching to floor to don her socks ?  ?Standing balance support: No upper extremity supported ?Standing balance-Leahy Scale: Fair ?Standing balance comment: mild imbalance immediately upon standing, but able to stand unsupported after regained balance ?  ?  ?  ?  ?  ?  ?  ?  ?  ?  ?  ?   ? ? ? ?Pertinent Vitals/Pain Pain Assessment ?Pain Assessment: No/denies pain  ? ? ?Home Living Family/patient expects to be discharged to:: Private residence ?Living Arrangements: Children ?Available Help at Discharge: Family;Available 24 hours/day ?Type of Home: House ?Home Access: Stairs to enter ?  ?Entrance Stairs-Number of Steps: 1 ?  ?Home Layout: One level ?Home Equipment: Rolling Walker (2 wheels);BSC/3in1 ?   ?  ?Prior Function Prior Level of Function : Independent/Modified Independent ?  ?  ?  ?  ?  ?  ?  Mobility Comments: per son pt walks 2 miles a day ?  ?  ? ? ?Hand Dominance  ? Dominant Hand: Right ? ?  ?Extremity/Trunk Assessment  ? Upper Extremity Assessment ?Upper Extremity Assessment: Overall WFL for tasks assessed;RUE  deficits/detail ?RUE Deficits / Details: pinky finger stuck in flexion ?  ? ?Lower Extremity Assessment ?Lower Extremity Assessment: Overall WFL for tasks assessed ?  ? ?Cervical / Trunk Assessment ?Cervical / Trunk Assessment: Kyphotic  ?Communication  ? Communication: No difficulties  ?Cognition Arousal/Alertness: Awake/alert ?Behavior During Therapy: [facility] for tasks assessed/performed ?Overall Cognitive Status: Impaired/Different from baseline ?Area of Impairment: Orientation, Memory, Safety/judgement ?  ?  ?  ?  ?  ?  ?  ?  ?Orientation Level: Place, Disoriented to, Time ?  ?Memory: Decreased short-term memory ?  ?Safety/Judgement: Decreased awareness of deficits ?  ?  ?  ?  ?  ? ?  ?General Comments General comments (skin integrity, edema, etc.): son in the room and her good friend ? ?  ?Exercises    ? ?Assessment/Plan  ?  ?PT Assessment Patient needs continued PT services  ?PT Problem List Decreased mobility;Decreased safety awareness;Decreased balance;Decreased cognition;Decreased knowledge of use of DME ? ?   ?  ?PT Treatment Interventions DME instruction;Therapeutic activities;Patient/family education;Gait training;Balance training;Functional mobility training   ? ?PT Goals (Current goals can be found in the Care Plan section)  ?Acute Rehab PT Goals ?Patient Stated Goal: to go home ?PT Goal Formulation: With patient/family ?Time For Goal Achievement: 01/21/22 ?Potential to Achieve Goals: Good ? ?  ?Frequency Min 3X/week ?  ? ? ?Co-evaluation   ?  ?  ?  ?  ? ? ?  ?AM-PAC PT "6 Clicks" Mobility  ?Outcome Measure Help needed turning from your back to your side while in a flat bed without using bedrails?: None ?Help needed moving from lying on your back to sitting on the side of a flat bed without using bedrails?: None ?Help needed moving to and from a bed to a chair (including a wheelchair)?: A Little ?Help needed standing up from a chair using your arms (e.g., wheelchair or bedside chair)?: A Little ?Help  needed to walk in hospital room?: A Little ?Help needed climbing 3-5 steps with a railing? : Total ?6 Click Score: 18 ? ?  ?End of Session Equipment Utilized During Treatment: Gait belt ?Activity Tolerance: Patient tolerated treatment well ?Patient left: in bed;with call bell/phone within reach;with family/visitor present ?  ?PT Visit Diagnosis: Other abnormalities of gait and mobility (R26.89);Other symptoms and signs involving the nervous system (R29.898) ?  ? ?Time: 0623-7628 ?PT Time Calculation (min) (ACUTE ONLY): 25 min ? ? ?Charges:   PT Evaluation ?$PT Eval Moderate Complexity: 1 Mod ?PT Treatments ?$Gait Training: 8-22 mins ?  ?   ? ? ?Magda Kiel, PT ?Acute Rehabilitation Services ?BTDVV:[phone] ?[phone] ?01/07/2022 ? ? ?[name] ?01/07/2022, 4:15 PM ? ?

## 2022-01-07 NOTE — TOC Progression Note (Signed)
Transition of Care (TOC) - Progression Note  ? ? ?Patient Details  ?Name: Kathryn [name] ?MRN: [id number] ?Date of Birth: 1931/07/19 ? ?Transition of Care (TOC) CM/SW Contact  ?Angelita Ingles, RN ?Phone Number:[phone] ? ?01/07/2022, 4:01 PM ? ?Clinical Narrative:    ? ?Transition of Care (TOC) Screening Note ? ? ?Patient Details  ?Name: Kathryn [name] ?Date of Birth: May 21, 1931 ? ? ?Transition of Care (TOC) CM/SW Contact:    ?Angelita Ingles, RN ?Phone Number: ?01/07/2022, 4:01 PM ? ? ? ?Transition of Care Department [facility]) has reviewed patient and no TOC needs have been identified at this time. We will continue to monitor patient advancement through interdisciplinary progression rounds.  ? ? ? ? ?  ?  ? ?Expected Discharge Plan and Services ?  ?  ?  ?  ?  ?                ?  ?  ?  ?  ?  ?  ?  ?  ?  ?  ? ? ?Social Determinants of Health (SDOH) Interventions ?  ? ?Readmission Risk Interventions ?   ? View : No data to display.  ?  ?  ?  ? ? ?

## 2022-01-07 NOTE — Progress Notes (Addendum)
Neurosurgery Service ?Progress Note ? ?Subjective: No acute events overnight. Still feels a bit off but no focal issues  ? ?Objective: ?Vitals:  ? 01/06/22 2137 01/06/22 2306 01/07/22 0309 01/07/22 0749  ?BP:  131/72 (!) 144/88 (!) 142/86  ?Pulse: 63 65 67 78  ?Resp: '16 18 19 18  '$ ?Temp: 97.6 ?F (36.4 ?C) 98 ?F (36.7 ?C) 97.8 ?F (36.6 ?C) 97.7 ?F (36.5 ?C)  ?TempSrc: Oral Oral Oral Oral  ?SpO2: 96% 95% 95% 96%  ? ? ?Physical Exam: ?AOx3, PERRL, EOMI, FS ?Strength 5/5 x4, SILTx4 ? ?Assessment & Plan: ?88 y.o. woman with new mild confusion, workup showing a left frontal enhancing mass concerning for primary brain tumor.  ? ?-awaiting word from neuro-oncology on whether or not she'll need a biopsy for treatment. If so, it would be on Tuesday, will update recs when I know more. Either way, would keep inpatient for another day for IV steroids and then see if we can get her out of the hospital possibly tomorrow if we have a treatment plan in place.  ? ?Judith Part  ?01/07/22 ?8:17 AM ? ? ?Discussed w/ Dr. Mickeal Skinner, he agrees that likely can proceed without a biopsy in this scenario. He is arranging a follow up appointment with her in clinic next week. From my standpoint, okay for discharge later today or tomorrow if she's clinically feeling up to it.  ?

## 2022-01-08 ENCOUNTER — Other Ambulatory Visit (HOSPITAL_COMMUNITY): Payer: Self-pay

## 2022-01-08 ENCOUNTER — Encounter: Payer: PPO | Admitting: Family Medicine

## 2022-01-08 ENCOUNTER — Telehealth: Payer: Self-pay | Admitting: Internal Medicine

## 2022-01-08 DIAGNOSIS — D539 Nutritional anemia, unspecified: Secondary | ICD-10-CM | POA: Diagnosis not present

## 2022-01-08 DIAGNOSIS — D496 Neoplasm of unspecified behavior of brain: Secondary | ICD-10-CM | POA: Diagnosis not present

## 2022-01-08 DIAGNOSIS — N390 Urinary tract infection, site not specified: Secondary | ICD-10-CM | POA: Diagnosis not present

## 2022-01-08 LAB — CBC
HCT: 31.1 % — ABNORMAL LOW (ref 36.0–46.0)
Hemoglobin: 10.5 g/dL — ABNORMAL LOW (ref 12.0–15.0)
MCH: 32.8 pg (ref 26.0–34.0)
MCHC: 33.8 g/dL (ref 30.0–36.0)
MCV: 97.2 fL (ref 80.0–100.0)
Platelets: 295 10*3/uL (ref 150–400)
RBC: 3.2 MIL/uL — ABNORMAL LOW (ref 3.87–5.11)
RDW: 13.2 % (ref 11.5–15.5)
WBC: 12.2 10*3/uL — ABNORMAL HIGH (ref 4.0–10.5)
nRBC: 0 % (ref 0.0–0.2)

## 2022-01-08 LAB — BASIC METABOLIC PANEL
Anion gap: 6 (ref 5–15)
BUN: 33 mg/dL — ABNORMAL HIGH (ref 8–23)
CO2: 22 mmol/L (ref 22–32)
Calcium: 9 mg/dL (ref 8.9–10.3)
Chloride: 107 mmol/L (ref 98–111)
Creatinine, Ser: 0.67 mg/dL (ref 0.44–1.00)
GFR, Estimated: >60 mL/min (ref >60)
Glucose, Bld: 123 mg/dL — ABNORMAL HIGH (ref 70–99)
Potassium: 4 mmol/L (ref 3.5–5.1)
Sodium: 135 mmol/L (ref 135–145)

## 2022-01-08 LAB — IRON AND TIBC
Iron: 76 ug/dL (ref 28–170)
Saturation Ratios: 30 % (ref 10.4–31.8)
TIBC: 249 ug/dL — ABNORMAL LOW (ref 250–450)
UIBC: 173 ug/dL

## 2022-01-08 LAB — RETICULOCYTES
Immature Retic Fract: 10.4 % (ref 2.3–15.9)
RBC.: 3.17 MIL/uL — ABNORMAL LOW (ref 3.87–5.11)
Retic Count, Absolute: 49.1 10*3/uL (ref 19.0–186.0)
Retic Ct Pct: 1.6 % (ref 0.4–3.1)

## 2022-01-08 LAB — FERRITIN: Ferritin: 59 ng/mL (ref 11–307)

## 2022-01-08 MED ORDER — DEXAMETHASONE 4 MG PO TABS
4.0000 mg | ORAL_TABLET | Freq: Two times a day (BID) | ORAL | 0 refills | Status: AC
Start: 2022-01-08 — End: ?
  Filled 2022-01-08: qty 60, 30d supply, fill #0

## 2022-01-08 MED ORDER — DEXAMETHASONE 4 MG PO TABS: 4.0000 mg | ORAL_TABLET | Freq: Two times a day (BID) | ORAL | Status: DC

## 2022-01-08 NOTE — Evaluation (Signed)
Occupational Therapy Evaluation ?Patient Details ?Name: Kathryn [name] ?MRN: [id number] ?DOB: 1931-06-02 ?Today's Date: 01/08/2022 ? ? ?History of Present Illness Kathryn [name] is a 88 y.o. female with medical history significant of HLD, Irregular heart rate admitted due to AMS, fatigue found to have new L frontal brain mass with up to 2cm L to R shift concerning for primary CNS neoplasm.  ? ?Clinical Impression ?  ?Patient admitted for the diagnosis above.  PTA she lives with her son, who is able to assist as needed, and remains very active and independent.  Primary deficit is unsteadiness.  Currently she is needing generalized Min A to [employer] for ADL completion from a sit/stand level, and for mobility in the room at hand held assist level.  OT will follo\w in the acute setting, with no post acute OT anticipated.  The patient will need 24 hour supervision due to balance deficits and decreased safety.    ?   ? ?Recommendations for follow up therapy are one component of a multi-disciplinary discharge planning process, led by the attending physician.  Recommendations may be updated based on patient status, additional functional criteria and insurance authorization.  ? ?Follow Up Recommendations ? No OT follow up  ?  ?Assistance Recommended at Discharge Intermittent Supervision/Assistance  ?Patient can return home with the following   ? ?  ?Functional Status Assessment ? Patient has had a recent decline in their functional status and demonstrates the ability to make significant improvements in function in a reasonable and predictable amount of time.  ?Equipment Recommendations ? None recommended by OT  ?  ?Recommendations for Other Services   ? ? ?  ?Precautions / Restrictions Precautions ?Precautions: Fall ?Restrictions ?Weight Bearing Restrictions: No  ? ?  ? ?Mobility Bed Mobility ?  ?  ?  ?  ?  ?  ?  ?General bed mobility comments: up in the recliner ?  ? ?Transfers ?Overall transfer level: Needs assistance ?   ?Transfers: Sit to/from Stand, Bed to chair/wheelchair/BSC ?Sit to Stand: Min guard ?  ?  ?Step pivot transfers: Min assist ?  ?  ?General transfer comment: HHA ?  ? ?  ?Balance Overall balance assessment: Needs assistance ?Sitting-balance support: Feet supported ?Sitting balance-Leahy Scale: Good ?  ?  ?Standing balance support: Single extremity supported ?Standing balance-Leahy Scale: Fair ?  ?  ?  ?  ?  ?  ?  ?  ?  ?  ?  ?  ?   ? ?ADL either performed or assessed with clinical judgement  ? ?ADL   ?  ?  ?Grooming: Min guard;Standing ?  ?  ?  ?  ?  ?Upper Body Dressing : Set up;Sitting ?  ?Lower Body Dressing: Min guard;Sit to/from stand ?  ?Toilet Transfer: Minimal assistance ?Toilet Transfer Details (indicate cue type and reason): One person HHA ?  ?  ?  ?  ?  ?   ? ? ? ?Vision Patient Visual Report: No change from baseline ?   ?   ?Perception Perception ?Perception: Within Functional Limits ?  ?Praxis Praxis ?Praxis: Intact ?  ? ?Pertinent Vitals/Pain Pain Assessment ?Pain Assessment: No/denies pain  ? ? ? ?Hand Dominance Right ?  ?Extremity/Trunk Assessment Upper Extremity Assessment ?Upper Extremity Assessment: Overall WFL for tasks assessed ?  ?Lower Extremity Assessment ?Lower Extremity Assessment: Defer to PT evaluation ?  ?Cervical / Trunk Assessment ?Cervical / Trunk Assessment: Kyphotic ?  ?Communication Communication ?Communication: No difficulties ?  ?Cognition Arousal/Alertness:  Awake/alert ?Behavior During Therapy: [facility] for tasks assessed/performed ?Overall Cognitive Status: Impaired/Different from baseline ?  ?  ?  ?  ?  ?  ?  ?  ?  ?  ?  ?  ?  ?Safety/Judgement: Decreased awareness of deficits ?  ?  ?  ?  ?  ?General Comments   VSS on RA ? ?  ?Exercises   ?  ?Shoulder Instructions    ? ? ?Home Living Family/patient expects to be discharged to:: Private residence ?Living Arrangements: Children ?Available Help at Discharge: Family;Available 24 hours/day ?Type of Home: House ?Home Access: Stairs to  enter ?  ?  ?Home Layout: One level ?  ?  ?Bathroom Shower/Tub: Walk-in shower ?  ?Bathroom Toilet: Standard ?  ?  ?Home Equipment: Rolling Walker (2 wheels);BSC/3in1 ?  ?  ?  ? ?  ?Prior Functioning/Environment Prior Level of Function : Independent/Modified Independent ?  ?  ?  ?  ?  ?  ?  ?ADLs Comments: No assist PLOF ?  ? ?  ?  ?OT Problem List: Impaired balance (sitting and/or standing) ?  ?   ?OT Treatment/Interventions: Self-care/ADL training;DME and/or AE instruction;Therapeutic activities;Balance training  ?  ?OT Goals(Current goals can be found in the care plan section) Acute Rehab OT Goals ?Patient Stated Goal: Go back home ?OT Goal Formulation: With patient ?Time For Goal Achievement: 01/15/22 ?Potential to Achieve Goals: Good ?ADL Goals ?Pt Will Perform Grooming: with set-up;standing ?Pt Will Perform Upper Body Dressing: Independently;sitting ?Pt Will Perform Lower Body Dressing: with set-up;sit to/from stand ?Pt Will Transfer to Toilet: with modified independence;ambulating;regular height toilet  ?OT Frequency: Min 2X/week ?  ? ?Co-evaluation   ?  ?  ?  ?  ? ?  ?AM-PAC OT "6 Clicks" Daily Activity     ?Outcome Measure Help from another person eating meals?: None ?Help from another person taking care of personal grooming?: None ?Help from another person toileting, which includes using toliet, bedpan, or urinal?: A Little ?Help from another person bathing (including washing, rinsing, drying)?: A Little ?Help from another person to put on and taking off regular upper body clothing?: None ?Help from another person to put on and taking off regular lower body clothing?: A Little ?6 Click Score: 21 ?  ?End of Session Equipment Utilized During Treatment: Gait belt ?Nurse Communication: Mobility status ? ?Activity Tolerance: Patient tolerated treatment well ?Patient left: in chair;with call bell/phone within reach;with chair alarm set ? ?OT Visit Diagnosis: Unsteadiness on feet (R26.81)  ?              ?Time:  1010-1030 ?OT Time Calculation (min): 20 min ?Charges:  OT General Charges ?$OT Visit: 1 Visit ?OT Evaluation ?$OT Eval Moderate Complexity: 1 Mod ? ?01/08/2022 ? ?RP, OTR/L ? ?Acute Rehabilitation Services ? ?Office:  [phone] ? ? ?Ameena Vesey D Niang [place] ?01/08/2022, 10:35 AM ?

## 2022-01-08 NOTE — Discharge Summary (Signed)
Physician Discharge Summary  ?AKIKO SCHEXNIDER TMH:[id number] DOB: [birth year] DOA: 01/05/2022 ? ?PCP: Jinny Sanders, MD ? ?Admit date: 01/05/2022 ?Discharge date: 01/08/2022 ? ?Time spent: 55 minutes ? ?Recommendations for Outpatient Follow-up:  ?Follow-up with Dr. Mickeal Skinner, neuro-oncology in 1 week.  Office will call with appointment time. ?Follow-up with Dr. Zada Finders, neurosurgery in 1 to 2 weeks. ?Follow-up with Diona Browner, Amy E, MD in 2 weeks.  On follow-up patient will need a basic metabolic profile done to follow-up on electrolytes and renal function. ? ? ?Discharge Diagnoses:  ?Principal Problem: ?  Neoplasm of brain causing mass effect on adjacent structures ([place]) ?Active Problems: ?  URINARY TRACT INFECTION, RECURRENT ?  Macrocytic anemia ? ? ?Discharge Condition: Stable and improved ? ?Diet recommendation: Regular ? ?There were no vitals filed for this visit. ? ?History of present illness:  ?HPI per Dr. Marcello Moores ?SYESHA THAW is a 88 y.o. female with medical history significant of HLD,Irregular heart rate  who present to ed brought in by son s/p referral from pcp due to noted confusion /odd behavior from baseline, increase fatigue progressive over the last one week.  ON evaluation in ED patient noted no complaint states she feels well. On ros denies f/c/n/v/cough / HA/vision changes, focal weakness, weight loss or decrease appetite.  ?Of note on evaluation patient found to have new brain mass concerning for neoplasm with associated mass effect and edema. ?  ?  ?ED Course:  ?Vitals ?Afeb, bp 158/84, hr 90, rr 16  ?Neurosugery Dr. Venetia Constable was consulted who recommended  decadron 10 mg  q6h ,MRI with plans for further evaluation in am re- whether patient is a candidate for any surgical therapy  or further evaluation with biopsy. ?Critical care also consulted and reviewed chart and noted no need for ICU admission or critical care needs ?  ?Notable labs: ?Wbc 8, hgb 11.7 at baseline , mcv 101.6 , NA 142, K 3.8, cr 0.74,   ?  ?Ct head  ?1. Large 4.2 cm heterogenous mass within the left frontal lobe with ?slight increased internal density possibly due to a small amount of ?hemorrhage within the mass. Mass is associated with considerable ?edema and local mass effect. Positive for subfalcine herniation and ?about 11-12 mm midline shift to the right at the level of the mass. ?Compression of the anterior aspects of the lateral ventricles. ?  ?MRI brain  ?IMPRESSION: ?4.2 x 4.0 x 4.5 cm heterogeneous intra-axial mass at the anterior ?inferior left frontal lobe with associated regional mass effect and ?up to 2 cm of left-to-right shift anteriorly. Finding most ?concerning for a primary CNS neoplasm, high-grade glioma/GBM. ? ?Hospital Course:  ?#1 neoplasm of the brain causing mass effect on adjacent structures ?-Patient presented to the ED with confusion, odd behavior from baseline. ?-CT head done with neoplasm of the brain with associated midline shift as well as edema noted. ?-Patient admitted awaiting progressive care bed. ?-Patient started on Decadron 10 mg IV every 6 hours. ?-MRI brain done with a 4.2 x 4.0 x 4.5 cm heterogeneous intra-axial mass at the anterior inferior left frontal lobe with associated regional mass effect and up to 2 cm of left-to-right shift anteriorly.  Findings concerning for primary CNS neoplasm, high-grade glioma/GBM. ?-Patient seen in consultation by neurosurgery who recommended continued IV steroids to help with edema/symptoms and also recommended biopsy as the ideal next step as opposed to craniectomy and resection. ?-Neurosurgery discussed with patient initially on presentation. ?-Neurosurgery subsequently discussed with neuro oncologist, Dr. Mickeal Skinner who  agreed that likely could proceed management of mass without a biopsy inpatient scenario and recommended outpatient follow-up in 1 week for further discussion and management.  ?-Patient remained in stable condition and will be discharged home on oral Decadron  4 mg p.o. twice daily until follow-up with neuro-oncology/neurosurgery.  ?-Outpatient palliative care evaluation has also been ordered.  ? ?2.  Macrocytic anemia ?-Patient with no overt bleeding. ?-Hemoglobin remained stable. ?-Folate of 21.1, vitamin B12 of 3861.  Iron at 76, TIBC of 249.   ?-Outpatient follow-up with PCP. ? ?3.  Probable UTI ?-Urinalysis hazy, moderate leukocytes, nitrite negative, WBC 21-50. ?-Urine cultures with multiple species noted. ?-Status post 3 days IV Rocephin during the hospitalization. ?-No further antibiotics needed on discharge. ?  ?  ?  ? ?Procedures: ?CT head 01/05/2022 ?MRI brain 01/05/2022 ? ?Consultations: ?Neurosurgery: Dr. Zada Finders 01/06/2021 ? ?Discharge Exam: ?Vitals:  ? 01/08/22 0300 01/08/22 0742  ?BP: 119/74 136/79  ?Pulse: (!) 56 65  ?Resp: 14 20  ?Temp: 97.8 ?F (36.6 ?C) 97.7 ?F (36.5 ?C)  ?SpO2: 92% 93%  ? ? ?General: NAD.  Frail.  Cachectic. ?Cardiovascular: Regular rate rhythm no murmurs rubs or gallops.  No JVD.  No lower extremity edema. ?Respiratory: Clear to auscultation bilaterally.  No wheezes, no crackles, no rhonchi. ? ?Discharge Instructions ? ? ?Discharge Instructions   ? ? Amb Referral to Palliative Care   Complete by: As directed ?  ? Diet general   Complete by: As directed ?  ? Increase activity slowly   Complete by: As directed ?  ? ?  ? ?Allergies as of 01/08/2022   ? ?   Reactions  ? Macrobid [nitrofurantoin Monohyd Macro]   ? Made pt sick to per stomach and shaky  ? ?  ? ?  ?Medication List  ?  ? ?TAKE these medications   ? ?B-12 2500 MCG Tabs ?Take 2,500 mcg by mouth daily. ?  ?Biotin 10 MG Tabs ?Take 10 mg by mouth daily. ?  ?cetirizine 10 MG tablet ?Commonly known as: ZYRTEC ?TAKE 1 TABLET DAILY ?  ?dexamethasone 4 MG tablet ?Commonly known as: DECADRON ?Take 1 tablet (4 mg total) by mouth every 12 (twelve) hours. ?  ?vitamin C 1000 MG tablet ?Take 1,000 mg by mouth daily. ?  ?zinc gluconate 50 MG tablet ?Take 50 mg by mouth daily. ?  ? ?   ? ?Allergies  ?Allergen Reactions  ? Macrobid [employer] Macro]   ?  Made pt sick to per stomach and shaky  ? ? Follow-up Information   ? ? Ventura Sellers, MD. Schedule an appointment as soon as possible for a visit in 1 week(s).   ?Specialties: Psychiatry, Neurology, Oncology ?Why: Office will call with appointment time. ?Contact information: ?[address] ?[place] [address] ?[phone] ? ? ?  ?  ? ? Judith Part, MD. Schedule an appointment as soon as possible for a visit in 2 week(s).   ?Specialty: Neurosurgery ?Why: Follow-up in 1-2 WEEKS. ?Contact information: ?[place] [address] ?[phone] ? ? ?  ?  ? ? Bedsole, Amy E, MD. Schedule an appointment as soon as possible for a visit in 2 week(s).   ?Specialty: Family Medicine ?Contact information: ?[address] ?[place] [address] ?[phone] ? ? ?  ?  ? ?  ?  ? ?  ? ? ? ?The results of significant diagnostics from this hospitalization (including imaging, microbiology, ancillary and laboratory) are listed below  for reference.   ? ?Significant Diagnostic Studies: ?CT HEAD WO CONTRAST (5MM) ? ?Result Date: 01/05/2022 ?CLINICAL DATA:  Mental status change EXAM: CT HEAD WITHOUT CONTRAST TECHNIQUE: Contiguous axial images were obtained from the base of the skull through the vertex without intravenous contrast. RADIATION DOSE REDUCTION: This exam was performed according to the departmental dose-optimization program which includes automated exposure control, adjustment of the mA and/or kV according to patient size and/or use of iterative reconstruction technique. COMPARISON:  CT brain 04/06/2018 FINDINGS: Brain: Large mass within the left frontal lobe measuring 4.2 x 3.8 x 2.9 cm with considerable surrounding edema and local mass effect. Sub fall seen herniation. About 11 mm midline shift to the right at the level of the mass. Mass effect on the anterior aspects of both lateral ventricles. Central  hyperdensity within the mass could reflect a small amount of hemorrhage. Vascular: No hyperdense vessels.  Carotid vascular calcification Skull: Normal. Negative for fracture or focal lesion. Sinuses/Orbits: No acu

## 2022-01-08 NOTE — Telephone Encounter (Signed)
Scheduled appointment per 04/14 referral. Patient is aware of appointment date and time. Patient is aware to arrive 15 mins prior to appointment time and to bring updated insurance cards. Patient is aware of location.   ?

## 2022-01-08 NOTE — Progress Notes (Signed)
Physical Therapy Treatment ?Patient Details ?Name: Kathryn [name] ?MRN: [id number] ?DOB: 09-23-1931 ?Today's Date: 01/08/2022 ? ? ?History of Present Illness Kathryn [name] is a 88 y.o. female with medical history significant of HLD, Irregular heart rate admitted due to AMS, fatigue found to have new L frontal brain mass with up to 2cm L to R shift concerning for primary CNS neoplasm. ? ?  ?PT Comments  ? ? Patient progressing with mobility and balance/safety using RW today for ambulation.  Also improved wearing her orthopedic shoes as opposed to with socks only.  Educated on using walker at home for fall prevention and continue to feel appropriate for HHPT for safety and balance training.  She may d/c home today, but if not, PT will follow up.    ?Recommendations for follow up therapy are one component of a multi-disciplinary discharge planning process, led by the attending physician.  Recommendations may be updated based on patient status, additional functional criteria and insurance authorization. ? ?Follow Up Recommendations ? Home health PT ?  ?  ?Assistance Recommended at Discharge Frequent or constant Supervision/Assistance  ?Patient can return home with the following A little help with walking and/or transfers;A little help with bathing/dressing/bathroom;Direct supervision/assist for medications management;Assist for transportation;Assistance with cooking/housework;Help with stairs or ramp for entrance;Direct supervision/assist for financial management ?  ?Equipment Recommendations ? None recommended by PT  ?  ?Recommendations for Other Services   ? ? ?  ?Precautions / Restrictions Precautions ?Precautions: Fall ?Restrictions ?Weight Bearing Restrictions: No  ?  ? ?Mobility ? Bed Mobility ?  ?  ?  ?  ?  ?  ?  ?General bed mobility comments: up in the recliner ?  ? ?Transfers ?Overall transfer level: Needs assistance ?Equipment used: None, Rolling walker (2 wheels) ?Transfers: Sit to/from Stand ?Sit to Stand: Min  guard, Supervision ?  ?  ?  ?  ?  ?General transfer comment: sit to stand no device minguard for balance, and pt reporting feeling off balance, with RW S level A ?  ? ?Ambulation/Gait ?Ambulation/Gait assistance: Supervision, Min guard ?Gait Distance (Feet): 150 Feet (x 2) ?Assistive device: Rolling walker (2 wheels), None ?Gait Pattern/deviations: Step-through pattern, Decreased stride length ?  ?  ?  ?General Gait Details: first with walker and no shoes, then with shoes and attempted without walker, but still unsteady so used walker and shoes with best safety and S throughout ? ? ?Stairs ?  ?  ?  ?  ?  ? ? ?Wheelchair Mobility ?  ? ?Modified Rankin (Stroke Patients Only) ?  ? ? ?  ?Balance Overall balance assessment: Needs assistance ?  ?Sitting balance-Leahy Scale: Good ?  ?  ?  ?Standing balance-Leahy Scale: Fair ?Standing balance comment: still unsteady initially needing UE support then with shoes can balance without walker for short distances ?  ?  ?  ?  ?  ?  ?  ?  ?  ?  ?  ?  ? ?  ?Cognition Arousal/Alertness: Awake/alert ?Behavior During Therapy: [facility] for tasks assessed/performed ?Overall Cognitive Status: Impaired/Different from baseline ?  ?  ?  ?  ?  ?  ?  ?  ?  ?Orientation Level: Disoriented to, Time ?  ?Memory: Decreased short-term memory ?  ?Safety/Judgement: Decreased awareness of deficits ?  ?  ?  ?  ?  ? ?  ?Exercises   ? ?  ?General Comments General comments (skin integrity, edema, etc.): Educated on using walker at home for fall  prevention and wearing shoes as well.  She states son will be with her at home.  MD in during session and plans to d/c home today ?  ?  ? ?Pertinent Vitals/Pain Pain Assessment ?Pain Assessment: No/denies pain  ? ? ?Home Living Family/patient expects to be discharged to:: Private residence ?Living Arrangements: Children ?Available Help at Discharge: Family;Available 24 hours/day ?Type of Home: House ?Home Access: Stairs to enter ?  ?  ?  ?Home Layout: One level ?Home  Equipment: Rolling Walker (2 wheels);BSC/3in1 ?   ?  ?Prior Function    ?  ?  ?   ? ?PT Goals (current goals can now be found in the care plan section) Progress towards PT goals: Progressing toward goals ? ?  ?Frequency ? ? ? Min 3X/week ? ? ? ?  ?PT Plan Current plan remains appropriate  ? ? ?Co-evaluation   ?  ?  ?  ?  ? ?  ?AM-PAC PT "6 Clicks" Mobility   ?Outcome Measure ? Help needed turning from your back to your side while in a flat bed without using bedrails?: None ?Help needed moving from lying on your back to sitting on the side of a flat bed without using bedrails?: None ?Help needed moving to and from a bed to a chair (including a wheelchair)?: A Little ?Help needed standing up from a chair using your arms (e.g., wheelchair or bedside chair)?: A Little ?Help needed to walk in hospital room?: None ?Help needed climbing 3-5 steps with a railing? : Total ?6 Click Score: 19 ? ?  ?End of Session Equipment Utilized During Treatment: Gait belt ?Activity Tolerance: Patient tolerated treatment well ?Patient left: in chair;with call bell/phone within reach;with chair alarm set ?  ?PT Visit Diagnosis: Other abnormalities of gait and mobility (R26.89);Other symptoms and signs involving the nervous system (R29.898) ?  ? ? ?Time: 1005-1025 ?PT Time Calculation (min) (ACUTE ONLY): 20 min ? ?Charges:  $Gait Training: 8-22 mins          ?          ? ?Magda Kiel, PT ?Acute Rehabilitation Services ?DVVOH:[phone] ?Office:[phone] ?01/08/2022 ? ? ? ?[name] ?01/08/2022, 1:32 PM ? ?

## 2022-01-08 NOTE — TOC Transition Note (Addendum)
Transition of Care (TOC) - CM/SW Discharge Note ? ? ?Patient Details  ?Name: Kathryn [name] ?MRN: [id number] ?Date of Birth: Jan 25, 1931 ? ?Transition of Care (TOC) CM/SW Contact:  ?Angelita Ingles, RN ?Phone Number:[phone] ? ?01/08/2022, 12:38 PM ? ? ?Clinical Narrative:    ?CM at bedside to offer patient choice for home health. Patient does not have preference but would like CM to call son. CM spoke with son Legrand Como who does not have a preference for [facility] agency. [place] referral has been accepted by Amy with [facility]. Start of care to begin Monday . There are no HH aides but OT will focus on all activities of daily living. MD has been updated.  ? ? ?  ?  ? ? ?Patient Goals and CMS Choice ?  ?  ?  ? ?Discharge Placement ?  ?           ?  ?  ?  ?  ? ?Discharge Plan and Services ?  ?  ?           ?  ?  ?  ?  ?  ?  ?  ?  ?  ?  ? ?Social Determinants of Health (SDOH) Interventions ?  ? ? ?Readmission Risk Interventions ?   ? View : No data to display.  ?  ?  ?  ? ? ? ? ? ?

## 2022-01-11 ENCOUNTER — Inpatient Hospital Stay: Payer: PPO | Attending: Neurological Surgery

## 2022-01-11 ENCOUNTER — Inpatient Hospital Stay (HOSPITAL_BASED_OUTPATIENT_CLINIC_OR_DEPARTMENT_OTHER): Payer: PPO | Admitting: Internal Medicine

## 2022-01-11 ENCOUNTER — Other Ambulatory Visit: Payer: Self-pay

## 2022-01-11 VITALS — BP 137/90 | HR 107 | Temp 97.5°F | Resp 18 | Wt 115.4 lb

## 2022-01-11 DIAGNOSIS — D496 Neoplasm of unspecified behavior of brain: Secondary | ICD-10-CM | POA: Insufficient documentation

## 2022-01-11 DIAGNOSIS — G9389 Other specified disorders of brain: Secondary | ICD-10-CM | POA: Diagnosis not present

## 2022-01-11 DIAGNOSIS — Z800 Family history of malignant neoplasm of digestive organs: Secondary | ICD-10-CM | POA: Insufficient documentation

## 2022-01-11 DIAGNOSIS — Z803 Family history of malignant neoplasm of breast: Secondary | ICD-10-CM | POA: Diagnosis not present

## 2022-01-11 NOTE — Progress Notes (Signed)
? ?[facility] [address]  ?[place], [place] [address] ?[phone] ? ? ?New Patient Evaluation ? ?Date of Service: 01/11/22 ?Patient Name: Kathryn [name] ?Patient MRN: [id number] ?Patient DOB: August 16, 1931 ?Provider: Ventura Sellers, MD ? ?Identifying Statement:  ?Kathryn [name] is a 88 y.o. female with left frontal  mass  who presents for initial consultation and evaluation.   ? ?Referring Provider: ?Judith Part, MD ?[address] ?[place],   [id number] ? ?Oncologic History: ?01/05/22: Presents with confusion, MRI demonstrates enhancing left frontal mass ? ?Biomarkers: ? ?MGMT Unknown.  ?IDH 1/2 Unknown.  ?EGFR Unknown  ?TERT Unknown  ? ?History of Present Illness: ?The patient's records from the referring physician were obtained and reviewed and the patient interviewed to confirm this HPI.  Kathryn [name] presented to medical attention last week with several weeks history of confusion, aberrant behavior.  Son, who lives with her full time, noticed impairment in memory, leaving stove running, forgetting how to open car door, etc.  In recent days she has been off balance and falling to the right side.  CNS imaging in the hospital demonstrated an enhancing left frontal mass, most likely c/w primary brain tumor.  Since discharge she has no new complaints, remains on decadron 12m twice per day.  Retired from [employer] ? ?Medications: ?Current Outpatient Medications on File Prior to Visit  ?Medication Sig Dispense Refill  ? Ascorbic Acid (VITAMIN C) 1000 MG tablet Take 1,000 mg by mouth daily.    ? Biotin 10 MG TABS Take 10 mg by mouth daily.    ? cetirizine (ZYRTEC) 10 MG tablet TAKE 1 TABLET DAILY (Patient taking differently: Take 10 mg by mouth daily.) 90 tablet 3  ? Cyanocobalamin (B-12) 2500 MCG TABS Take 2,500 mcg by mouth daily.    ? dexamethasone (DECADRON) 4 MG tablet Take 1 tablet (4 mg total) by mouth every 12 (twelve) hours. 60 tablet 0  ? zinc gluconate 50 MG  tablet Take 50 mg by mouth daily.    ? ?No current facility-administered medications on file prior to visit.  ? ? ?Allergies:  ?Allergies  ?Allergen Reactions  ? Macrobid [[employer]]   ?  Made pt sick to per stomach and shaky  ? ?Past Medical History:  ?Past Medical History:  ?Diagnosis Date  ? Allergy   ? Arthritis   ? Colon polyps   ? History of blood transfusion   ? with previous surgeries  ? Hyperlipidemia   ? Irregular heart rate   ? PONV (postoperative nausea and vomiting)   ? hx of pt. states she thinks was from pain med.  ? ?Past Surgical History:  ?Past Surgical History:  ?Procedure Laterality Date  ? CARDIAC CATHETERIZATION    ? HAND SURGERY Left   ? TOTAL HIP ARTHROPLASTY  2007  ? LEFT  ? TOTAL HIP ARTHROPLASTY Right 10/13/2016  ? Procedure: RIGHT TOTAL HIP ARTHROPLASTY ANTERIOR APPROACH;  Surgeon: FGaynelle Arabian MD;  Location: [facility];  Service: Orthopedics;  Laterality: Right;  ? TOTAL KNEE ARTHROPLASTY    ? Right   ? TOTAL KNEE ARTHROPLASTY Left 11/24/2020  ? Procedure: TOTAL KNEE ARTHROPLASTY;  Surgeon: AGaynelle Arabian MD;  Location: [facility];  Service: Orthopedics;  Laterality: Left;  540m  ? TUBAL LIGATION    ? ?Social History:  ?Social History  ? ?Socioeconomic History  ? Marital status: Widowed  ?  Spouse name: Not on file  ? Number of children:  1  ? Years of education: Not on file  ? Highest education level: Not on file  ?Occupational History  ? Occupation: Chartered loss adjuster  ?  Comment: Works at [employer]  ?Tobacco Use  ? Smoking status: Never  ? Smokeless tobacco: Never  ?Vaping Use  ? Vaping Use: Never used  ?Substance and Sexual Activity  ? Alcohol use: Yes  ?  Alcohol/week: 3.0 standard drinks  ?  Types: 3 Standard drinks or equivalent per week  ?  Comment: Occassionally  ? Drug use: No  ? Sexual activity: Never  ?  Birth control/protection: Post-menopausal, Surgical  ?Other Topics Concern  ? Not on file  ?Social History Narrative  ? Regular exercise-- yes, 3-4 times a week  ?    ? Diet: limited water, some fruit and veggies  ?   ? Full code, has living will, HCPOA, son Kathryn [name] ( reviewed 2015)  ? ?Social Determinants of Health  ? ?Financial Resource Strain: Low Risk   ? Difficulty of Paying Living Expenses: Not hard at all  ?Food Insecurity: No Food Insecurity  ? Worried About Charity fundraiser in the Last Year: Never true  ? Ran Out of Food in the Last Year: Never true  ?Transportation Needs: No Transportation Needs  ? Lack of Transportation (Medical): No  ? Lack of Transportation (Non-Medical): No  ?Physical Activity: Sufficiently Active  ? Days of Exercise per Week: 7 days  ? Minutes of Exercise per Session: 30 min  ?Stress: No Stress Concern Present  ? Feeling of Stress : Not at all  ?Social Connections: Moderately Isolated  ? Frequency of Communication with Friends and Family: More than three times a week  ? Frequency of Social Gatherings with Friends and Family: More than three times a week  ? Attends Religious Services: 1 to 4 times per year  ? Active Member of Clubs or Organizations: No  ? Attends Archivist Meetings: Never  ? Marital Status: Widowed  ?Intimate Partner Violence: Not At Risk  ? Fear of Current or Ex-Partner: No  ? Emotionally Abused: No  ? Physically Abused: No  ? Sexually Abused: No  ? ?Family History:  ?Family History  ?Problem Relation Age of Onset  ? Pancreatic cancer Mother   ? Hypertension Sister   ? Heart disease Sister   ? Breast cancer Sister   ?     Age 51's  ? Heart attack Brother   ? Heart disease Brother   ? Hypertension Brother   ? Bone cancer Sister   ? Heart disease Brother   ? Colon cancer Neg Hx   ? Colon polyps Neg Hx   ? Esophageal cancer Neg Hx   ? Diabetes Neg Hx   ? Kidney disease Neg Hx   ? Gallbladder disease Neg Hx   ? ? ?Review of Systems: ?Constitutional: Doesn't report fevers, chills or abnormal weight loss ?Eyes: Doesn't report blurriness of vision ?Ears, nose, mouth, throat, and face: Doesn't report sore  throat ?Respiratory: Doesn't report cough, dyspnea or wheezes ?Cardiovascular: Doesn't report palpitation, chest discomfort  ?Gastrointestinal:  Doesn't report nausea, constipation, diarrhea ?GU: Doesn't report incontinence ?Skin: Doesn't report skin rashes ?Neurological: Per HPI ?Musculoskeletal: Doesn't report joint pain ?Behavioral/Psych: Doesn't report anxiety ? ?Physical Exam: ?Vitals:  ? 01/11/22 1515  ?BP: 137/90  ?Pulse: (!) 107  ?Resp: 18  ?Temp: (!) 97.5 ?F (36.4 ?C)  ? ?KPS: 70. ?General: Alert, cooperative, pleasant, in no acute distress ?Head: Normal ?EENT: No conjunctival  injection or scleral icterus.  ?Lungs: Resp effort normal ?Cardiac: Regular rate ?Abdomen: Non-distended abdomen ?Skin: No rashes cyanosis or petechiae. ?Extremities: No clubbing or edema ? ?Neurologic Exam: ?Mental Status: Awake, alert, attentive to examiner. Oriented to self and environment. Language is fluent with intact comprehension.  Impaired insight, impaired short term recall. ?Cranial Nerves: Visual acuity is grossly normal. Visual fields are full. Extra-ocular movements intact. No ptosis. Face is symmetric ?Motor: Tone and bulk are normal. Power is full in both arms and legs. Reflexes are symmetric, no pathologic reflexes present.  ?Sensory: Intact to light touch ?Gait: Dystaxic ? ? ?Labs: ?I have reviewed the data as listed ?   ?Component Value Date/Time  ? NA 135 01/08/2022 0435  ? K 4.0 01/08/2022 0435  ? CL 107 01/08/2022 0435  ? CO2 22 01/08/2022 0435  ? GLUCOSE 123 (H) 01/08/2022 0435  ? BUN 33 (H) 01/08/2022 0435  ? CREATININE 0.67 01/08/2022 0435  ? CREATININE 0.66 09/28/2016 1007  ? CALCIUM 9.0 01/08/2022 0435  ? PROT 6.7 01/06/2022 0418  ? ALBUMIN 2.8 (L) 01/07/2022 0305  ? AST 14 (L) 01/06/2022 0418  ? ALT 15 01/06/2022 0418  ? ALKPHOS 62 01/06/2022 0418  ? BILITOT 0.6 01/06/2022 0418  ? GFRNONAA >60 01/08/2022 0435  ? GFRAA >60 10/15/2016 0426  ? ?Lab Results  ?Component Value Date  ? WBC 12.2 (H) 01/08/2022  ?  NEUTROABS 6.5 01/05/2022  ? HGB 10.5 (L) 01/08/2022  ? HCT 31.1 (L) 01/08/2022  ? MCV 97.2 01/08/2022  ? PLT 295 01/08/2022  ? ? ?Imaging: ? ?CT HEAD WO CONTRAST (5MM) ? ?Result Date: 01/05/2022 ?CLINICAL DATA:  [place]

## 2022-01-12 ENCOUNTER — Telehealth: Payer: Self-pay | Admitting: *Deleted

## 2022-01-12 NOTE — Telephone Encounter (Signed)
[facility] called.  Patient has called to start hospice.  Dr Mickeal Skinner agrees and will the attending. ?

## 2022-01-13 ENCOUNTER — Telehealth: Payer: Self-pay

## 2022-01-13 NOTE — Telephone Encounter (Signed)
Hospice referral to [facility] will start as Palliative Care until radiation treatments are completed and then will be transitioned to Hospice services per [place] at [facility]. ?

## 2022-01-14 ENCOUNTER — Other Ambulatory Visit: Payer: Self-pay | Admitting: *Deleted

## 2022-01-14 ENCOUNTER — Telehealth: Payer: Self-pay

## 2022-01-14 ENCOUNTER — Telehealth: Payer: Self-pay | Admitting: Family Medicine

## 2022-01-14 DIAGNOSIS — G9389 Other specified disorders of brain: Secondary | ICD-10-CM

## 2022-01-14 DIAGNOSIS — N390 Urinary tract infection, site not specified: Secondary | ICD-10-CM | POA: Diagnosis not present

## 2022-01-14 DIAGNOSIS — D539 Nutritional anemia, unspecified: Secondary | ICD-10-CM | POA: Diagnosis not present

## 2022-01-14 DIAGNOSIS — M6281 Muscle weakness (generalized): Secondary | ICD-10-CM | POA: Diagnosis not present

## 2022-01-14 DIAGNOSIS — D496 Neoplasm of unspecified behavior of brain: Secondary | ICD-10-CM | POA: Diagnosis not present

## 2022-01-14 DIAGNOSIS — R2681 Unsteadiness on feet: Secondary | ICD-10-CM | POA: Diagnosis not present

## 2022-01-14 NOTE — Telephone Encounter (Signed)
[facility] stated this below: ?"Need to speak with the nurse about a verbal order"  ? ?Best Callback Number: [phone] ?

## 2022-01-14 NOTE — Progress Notes (Signed)
Spoke with son and he did confirm that he wanted to proceed with 3 weeks radiation ONLY (no chemo).   ? ?Referral placed. ?

## 2022-01-14 NOTE — Telephone Encounter (Signed)
Spoke with [employer].  She is requesting orders for [facility] PT 2 x week for 1 week and then 1 x week for 3 week.  Verbal okay given via telephone for PT orders as requested. ?

## 2022-01-14 NOTE — Telephone Encounter (Signed)
Spoke with patient's son Legrand Como and scheduled a telephonic Palliative Consult for 01/19/22 @ 12:30 PM.  ? ?Consent obtained; updated Netsmart, Team List and Epic.  ? ?

## 2022-01-18 ENCOUNTER — Other Ambulatory Visit: Payer: Self-pay | Admitting: Radiation Therapy

## 2022-01-18 DIAGNOSIS — C719 Malignant neoplasm of brain, unspecified: Secondary | ICD-10-CM

## 2022-01-19 ENCOUNTER — Other Ambulatory Visit: Payer: Self-pay | Admitting: Student

## 2022-01-19 DIAGNOSIS — C719 Malignant neoplasm of brain, unspecified: Secondary | ICD-10-CM

## 2022-01-19 DIAGNOSIS — R630 Anorexia: Secondary | ICD-10-CM

## 2022-01-19 DIAGNOSIS — Z515 Encounter for palliative care: Secondary | ICD-10-CM

## 2022-01-19 NOTE — Progress Notes (Signed)
? ? ?Manufacturing engineer ?Community Palliative Care Consult Note ?Telephone: [phone]  ?Fax: [phone]  ? ?Date of encounter: 01/19/22 ?12:45 PM ?PATIENT NAME: Kathryn [name] ?[address] ?[name] [address]   ?[phone] (home)  ?DOB: [birth year] ?MRN: [id number] ?PRIMARY CARE PROVIDER:    ?Jinny Sanders, MD,  ?[address] ?[place] [address] ?[phone] ? ?REFERRING PROVIDER:   ?Jinny Sanders, MD ?[address] ?[place],  [place] [address] ?[phone] ? ?RESPONSIBLE PARTY:    ?Contact Information   ? ? Name Relation Home Work Mobile  ? Mcmath,Michael Son [phone]  ? [name] [phone]  [phone]  ? ?  ? ? ? ?Due to the COVID-19 crisis, this visit was done via telemedicine from my office and it was initiated and consent by this patient and or family.   ? ?Due to the COVID-19 crisis, this home visit was done via telephone due to the patient's inability to connect via an audiovisual connection or their refusal to have an in-person visit. This connection was agreed to by the patient. Verified that I am speaking with the correct person using two identifiers. I  discussed the limitations of evaluation and management by telemedicine. The patient expressed understanding and agreed to proceed. ? ?                                 ASSESSMENT AND PLAN / RECOMMENDATIONS:  ? ?Advance Care Planning/Goals of Care: Goals include to maximize quality of life and symptom management. Patient/health care surrogate gave his/her permission to discuss.Our advance care planning conversation included a discussion about:    ?The value and importance of advance care planning  ?Experiences with loved ones who have been seriously ill or have died  ?Exploration of personal, cultural or spiritual beliefs that might influence medical decisions  ?Exploration of goals of care in the event of a sudden injury or illness  ?Son Kathryn [name] is HCPOA ?CODE STATUS:  DNR ? ?Education provided on Palliative Medicine vs. Hospice services. Patient is planning to start radiation. Son is educated that should she stop treatment, she can be referred for hospice evaluation.  ? ?Symptom Management/Plan: ? ?Glioblastoma-patient plans to pursue radiation. She is to follow up with oncology as scheduled. We discussed monitoring for worsening symptoms. Her decadron recently decreased to 4 mg daily. Discussed referring to hospice when she is no longer receiving treatment.  ? ?Appetite-encourage foods patient enjoys as she does not like the nutritional supplements.  ? ?Follow up Palliative Care Visit: Palliative care will continue to follow for complex medical decision making, advance care planning, and clarification of goals. Return in 3-4 weeks or prn. ? ? ?This visit was coded based on medical decision making (MDM). ? ?PPS: 60% ? ?HOSPICE ELIGIBILITY/DIAGNOSIS: TBD ? ?Chief Complaint: Palliative Medicine initial consult. ? ?HISTORY OF PRESENT ILLNESS:  Kathryn [name] is a 88 y.o. year old female  with Glioblastoma, OA, allergic rhinitis, first degree AV block, vitamin B12 deficiency, anemia. Patient recently hospitalized 4/11 through 01/08/22 due to increased confusion and behavior from her baseline. She was found to have glioblastoma, also treated for urinary tract infection. ? ?Patient and son have decided to pursue aggressive treatment. Patient is to receive radiation over three weeks. Son states she will not receive chemotherapy. Son reports patient having worsening confusion. She denies pain, shortness of breath, nausea, constipation. She endorses a good appetite,  while her son states she is eating three meals, but very small amounts. She is still able to perform adl's, although having more difficulty. She is also having difficulty with her balance. She is more withdrawn and sleeping more. HPI and ROS obtained from both patient and son.   ?  ? ?History obtained from review of EMR,  discussion with primary team, and interview with family, facility staff/caregiver and/or Kathryn [name].  ?I reviewed available labs, medications, imaging, studies and related documents from the EMR.  Records reviewed and summarized above.  ? ?ROS ? ?General: NAD ?EYES: denies vision changes ?ENMT: denies dysphagia ?Cardiovascular: denies chest pain, denies DOE ?Pulmonary: denies cough, denies increased SOB ?Abdomen: denies constipation, endorses continence of bowel ?GU: denies dysuria ?MSK:  +weakness,  no falls reported ?Skin: denies rashes or wounds ?Neurological: denies pain, denies insomnia ?Psych: Endorses stable mood ?Heme/lymph/immuno: denies bruises, abnormal bleeding ? ?Physical Exam: ? ?PE deferred due to this being a telemedicine visit.  ? ?CURRENT PROBLEM LIST:  ?Patient Active Problem List  ? Diagnosis Date Noted  ? Neoplasm of brain causing mass effect on adjacent structures ([place]) 01/06/2022  ? Brain mass   ? Sore throat 05/28/2021  ? Osteoarthritis of both knees 11/24/2020  ? OA (osteoarthritis) of knee 08/04/2020  ? Unusual smell in nose 03/04/2020  ? Macrocytic anemia 10/17/2018  ? First degree AV block 07/13/2016  ? Counseling regarding end of life decision making 07/08/2015  ? Irregular heart rate 04/09/2014  ? Elevated cholesterol   ? Pre-op evaluation 02/02/2011  ? B12 deficiency 09/26/2008  ? LEG CRAMPS, NOCTURNAL 09/25/2008  ? URINARY TRACT INFECTION, RECURRENT 07/11/2008  ? Osteoporosis 06/28/2007  ? ALLERGIC RHINITIS 05/31/2007  ? Osteoarthritis, hand 05/31/2007  ? ?PAST MEDICAL HISTORY:  ?Active Ambulatory Problems  ?  Diagnosis Date Noted  ? B12 deficiency 09/26/2008  ? ALLERGIC RHINITIS 05/31/2007  ? URINARY TRACT INFECTION, RECURRENT 07/11/2008  ? Osteoarthritis, hand 05/31/2007  ? LEG CRAMPS, NOCTURNAL 09/25/2008  ? Osteoporosis 06/28/2007  ? Pre-op evaluation 02/02/2011  ? Elevated cholesterol   ? Irregular heart rate 04/09/2014  ? Counseling regarding end of life decision making  07/08/2015  ? First degree AV block 07/13/2016  ? Macrocytic anemia 10/17/2018  ? Unusual smell in nose 03/04/2020  ? OA (osteoarthritis) of knee 08/04/2020  ? Osteoarthritis of both knees 11/24/2020  ? Sore throat 05/28/2021  ? Neoplasm of brain causing mass effect on adjacent structures ([place]) 01/06/2022  ? Brain mass   ? ?Resolved Ambulatory Problems  ?  Diagnosis Date Noted  ? CELLULITIS, ABDOMEN 03/12/2010  ? Neck pain 11/28/2012  ? Right shoulder pain 01/22/2014  ? Palpitations 01/22/2014  ? Hematoma 01/16/2015  ? OA (osteoarthritis) of hip 10/13/2016  ? Accidental fall 04/14/2018  ? Scalp laceration, subsequent encounter 04/14/2018  ? Cellulitis of left foot 11/10/2018  ? Left foot pain 11/10/2018  ? Leg wound, left, subsequent encounter 07/20/2019  ? Persistent atrial fibrillation ([place]) 11/11/2020  ? ?Past Medical History:  ?Diagnosis Date  ? Allergy   ? Arthritis   ? Colon polyps   ? History of blood transfusion   ? Hyperlipidemia   ? PONV (postoperative nausea and vomiting)   ? ?SOCIAL HX:  ?Social History  ? ?Tobacco Use  ? Smoking status: Never  ? Smokeless tobacco: Never  ?Substance Use Topics  ? Alcohol use: Yes  ?  Alcohol/week: 3.0 standard drinks  ?  Types: 3 Standard drinks or equivalent per week  ?  Comment:  Occassionally  ? ?FAMILY HX:  ?Family History  ?Problem Relation Age of Onset  ? Pancreatic cancer Mother   ? Hypertension Sister   ? Heart disease Sister   ? Breast cancer Sister   ?     Age 84's  ? Heart attack Brother   ? Heart disease Brother   ? Hypertension Brother   ? Bone cancer Sister   ? Heart disease Brother   ? Colon cancer Neg Hx   ? Colon polyps Neg Hx   ? Esophageal cancer Neg Hx   ? Diabetes Neg Hx   ? Kidney disease Neg Hx   ? Gallbladder disease Neg Hx   ?   ? ?ALLERGIES:  ?Allergies  ?Allergen Reactions  ? Macrobid [employer] Macro]   ?  Made pt sick to per stomach and shaky  ?   ?PERTINENT MEDICATIONS:  ?Outpatient Encounter Medications as of 01/19/2022   ?Medication Sig  ? Ascorbic Acid (VITAMIN C) 1000 MG tablet Take 1,000 mg by mouth daily.  ? Biotin 10 MG TABS Take 10 mg by mouth daily.  ? cetirizine (ZYRTEC) 10 MG tablet TAKE 1 TABLET DAILY (Patient taking differently: T

## 2022-01-20 ENCOUNTER — Telehealth: Payer: Self-pay | Admitting: Family Medicine

## 2022-01-20 NOTE — Telephone Encounter (Signed)
Verbal orders given, via telephone, to Will with [facility] for OT 1 x week for 3 weeks.  ?

## 2022-01-20 NOTE — Progress Notes (Incomplete)
Location/Histology of Brain Tumor: Left Frontal ? ?Patient presented with symptoms of confusion and changes in behavior for several weeks.  Son noticed impairment in her memory and forgetfulness (leaving the stove on, forgetting how to open the car door).  More recently she has been off balance and falling to the right side. ? ?MRI Brain 01/05/2022: 4.2 x 4.0 x 4.5 cm heterogeneous intra-axial mass at the anterior inferior left frontal lobe with associated regional mass effect and up to 2 cm of left-to-right shift anteriorly. Finding most concerning for a primary CNS neoplasm, high-grade glioma/GBM. ? ?CT Head 01/05/2022: Large 4.2 cm heterogenous mass within the left frontal lobe with slight increased internal density possibly due to a small amount of hemorrhage within the mass. Mass is associated with considerable edema and local mass effect. Positive for subfalcine herniation and about 11-12 mm midline shift to the right at the level of the mass.  Compression of the anterior aspects of the lateral ventricles. ? ?Past or anticipated interventions, if any, per neurosurgery:  ? ?Past or anticipated interventions, if any, per medical oncology:  ?Dr. Mickeal Skinner 01/11/2022 ?- She presents with clinical and radiographic syndrome consistent with left frontal primary brain neoplasm.  Likely etiology is high grade glioma, glioblastoma.   ?-We had an extensive conversation with Kathryn [name] and her son regarding likely pathology, prognosis, and available treatment pathways.  They understand this condition is incurable, and prognosis is limited even with aggressive treatments. ?-We discussed and offered and abbreviated course (3 weeks) of intensity modulated radiation therapy with our without concurrent daily Temozolomide.  Radiation will be administered Mon-Fri over 3 weeks, Temodar will be dosed at '75mg'$ /m2 to be given daily over 42 days.  ?-The other option would be transition to comfort care only, hospice. ?-Kathryn [name] and  her son will continue the goals of care discussion at home, and let us know what their preferred path forward is.  We are happy to continue the discussion in person or via phone if there are additional questions. ? ? ?Dose of Decadron, if applicable: 4 mg daily ? ? ? ?Recent neurologic symptoms, if any:  ?Seizures: {:18581} ?Headaches: {:18581} ?Nausea: {:18581} ?Dizziness/ataxia: {:18581} ?Difficulty with hand coordination: {:18581} ?Focal numbness/weakness: {:18581} ?Visual deficits/changes: {:18581} ?Confusion/Memory deficits: {:18581} ? ? ? ?SAFETY ISSUES: ?Prior radiation? {:18581} ?Pacemaker/ICD? {:18581} ?Possible current pregnancy? Postmenopausal ?Is the patient on methotrexate? {:18581} ? ?Additional Complaints / other details:  ?-Patient will start Palliative Care until radiation treatments are complete then she will transition to Hospice. ?

## 2022-01-20 NOTE — Telephone Encounter (Signed)
[facility] called to request verbal orders for OT -- 1 time a week for 3 weeks. ? CB: [phone], can leave msg. ?

## 2022-01-21 ENCOUNTER — Ambulatory Visit
Admission: RE | Admit: 2022-01-21 | Discharge: 2022-01-21 | Disposition: A | Discharge status: Home / Self Care | Payer: PPO | Source: Ambulatory Visit | Attending: Radiation Oncology | Admitting: Radiation Oncology

## 2022-01-21 ENCOUNTER — Other Ambulatory Visit: Payer: Self-pay

## 2022-01-21 VITALS — BP 132/73 | HR 67 | Temp 97.7°F | Resp 20 | Wt 119.0 lb

## 2022-01-21 VITALS — BP 132/73 | HR 67 | Temp 97.7°F | Resp 20 | Ht 67.0 in | Wt 119.0 lb

## 2022-01-21 DIAGNOSIS — Z86010 Personal history of colonic polyps: Secondary | ICD-10-CM | POA: Diagnosis not present

## 2022-01-21 DIAGNOSIS — C711 Malignant neoplasm of frontal lobe: Secondary | ICD-10-CM | POA: Diagnosis not present

## 2022-01-21 DIAGNOSIS — Z803 Family history of malignant neoplasm of breast: Secondary | ICD-10-CM | POA: Diagnosis not present

## 2022-01-21 DIAGNOSIS — C719 Malignant neoplasm of brain, unspecified: Secondary | ICD-10-CM

## 2022-01-21 DIAGNOSIS — E785 Hyperlipidemia, unspecified: Secondary | ICD-10-CM | POA: Diagnosis not present

## 2022-01-21 DIAGNOSIS — Z79899 Other long term (current) drug therapy: Secondary | ICD-10-CM | POA: Diagnosis not present

## 2022-01-21 DIAGNOSIS — Z800 Family history of malignant neoplasm of digestive organs: Secondary | ICD-10-CM | POA: Diagnosis not present

## 2022-01-21 NOTE — Progress Notes (Signed)
?Radiation Oncology         [phone] ?________________________________ ? ?Name: Kathryn [name]        MRN: [id number]  ?Date of Service: 01/21/2022 DOB: Mar 22, 1931 ? ?[id number], Mervyn Gay, MD  Mickeal Skinner, Acey Lav, MD    ? ?REFERRING PHYSICIAN: Ventura Sellers, MD ? ? ?DIAGNOSIS: The encounter diagnosis was GBM (glioblastoma multiforme) ([place]). ? ? ?HISTORY OF PRESENT ILLNESS: Kathryn [name] is a 88 y.o. female seen at the request of Dr. Mickeal Skinner for putative glioblastoma of the left frontal lobe. The patient was in her usual state of health until showing changes in behavior and memory, as well a changes in gait with being off balance and leaning toward the right side while walking. These symptoms began a few weeks ago. She was seen for these symptoms and CT head on 01/05/22 showed a 4.2 cm mass in the left frontal lobe with slight increased density  and considerable edema with mass effect was seen with subfalcine herniation and 11-2 mm of rightward shift of midline. Compression of the lateral ventricles was also noted. An MRI that day of the brain showed a 4.5 cm intra-axial mass in the anterior inferior left frontal lobe with regional mass effect up to 2 cmm of left to right shift. Her case was discussed in [employer].  Given her age and comorbidities, it was felt she would benefit from a palliative course of radiation without undergoing the risks of brain biopsy or craniotomy for tissue. Plans that have been discussed include palliative radiation followed by hospice enrollment. She's seen today with her son to discuss options of radiation. ? ? ? ?PREVIOUS RADIATION THERAPY: No ? ? ?PAST MEDICAL HISTORY:  ?Past Medical History:  ?Diagnosis Date  ? Allergy   ? Arthritis   ? Colon polyps   ? History of blood transfusion   ? with previous surgeries  ? Hyperlipidemia   ? Irregular heart rate   ? PONV (postoperative nausea and vomiting)   ? hx of pt. states she thinks was from pain med.  ?   ? ? ?PAST SURGICAL  HISTORY: ?Past Surgical History:  ?Procedure Laterality Date  ? CARDIAC CATHETERIZATION    ? HAND SURGERY Left   ? TOTAL HIP ARTHROPLASTY  2007  ? LEFT  ? TOTAL HIP ARTHROPLASTY Right 10/13/2016  ? Procedure: RIGHT TOTAL HIP ARTHROPLASTY ANTERIOR APPROACH;  Surgeon: Gaynelle Arabian, MD;  Location: [facility];  Service: Orthopedics;  Laterality: Right;  ? TOTAL KNEE ARTHROPLASTY    ? Right   ? TOTAL KNEE ARTHROPLASTY Left 11/24/2020  ? Procedure: TOTAL KNEE ARTHROPLASTY;  Surgeon: Gaynelle Arabian, MD;  Location: [facility];  Service: Orthopedics;  Laterality: Left;  56mn  ? TUBAL LIGATION    ? ? ? ?FAMILY HISTORY:  ?Family History  ?Problem Relation Age of Onset  ? Pancreatic cancer Mother   ? Hypertension Sister   ? Heart disease Sister   ? Breast cancer Sister   ?     Age 223's ? Heart attack Brother   ? Heart disease Brother   ? Hypertension Brother   ? Bone cancer Sister   ? Heart disease Brother   ? Colon cancer Neg Hx   ? Colon polyps Neg Hx   ? Esophageal cancer Neg Hx   ? Diabetes Neg Hx   ? Kidney disease Neg Hx   ? Gallbladder disease Neg Hx   ? ? ? ?SOCIAL HISTORY:  reports that she has never  smoked. She has never used smokeless tobacco. She reports current alcohol use of about 3.0 standard drinks per week. She reports that she does not use drugs. ? ? ?ALLERGIES: Macrobid [nitrofurantoin monohyd macro] ? ? ?MEDICATIONS:  ?Current Outpatient Medications  ?Medication Sig Dispense Refill  ? Ascorbic Acid (VITAMIN C) 1000 MG tablet Take 1,000 mg by mouth daily.    ? Biotin 10 MG TABS Take 10 mg by mouth daily.    ? cetirizine (ZYRTEC) 10 MG tablet TAKE 1 TABLET DAILY (Patient not taking: Reported on 01/21/2022) 90 tablet 3  ? Cyanocobalamin (B-12) 2500 MCG TABS Take 2,500 mcg by mouth daily.    ? dexamethasone (DECADRON) 4 MG tablet Take 1 tablet (4 mg total) by mouth every 12 (twelve) hours. 60 tablet 0  ? zinc gluconate 50 MG tablet Take 50 mg by mouth daily.    ? ?No current facility-administered medications for this  encounter.  ? ? ? ?REVIEW OF SYSTEMS: On review of systems, the patient is not walking into furniture or into walls at home. It's been hard for the patient's son or the patient to notice much difference since starting steroids. She is not having any trouble with progressive confusion, but is confused and this has been the status quo for about a month. She is not having any shortness of breath or chest pain. No complaints of headaches or nausea are noted.  ? ?  ? ?PHYSICAL EXAM:  ?Wt Readings from Last 3 Encounters:  ?01/21/22 119 lb (54 kg)  ?01/21/22 119 lb (54 kg)  ?01/11/22 115 lb 6.4 oz (52.3 kg)  ? ?Temp Readings from Last 3 Encounters:  ?01/21/22 97.7 ?F (36.5 ?C)  ?01/21/22 97.7 ?F (36.5 ?C) (Oral)  ?01/11/22 (!) 97.5 ?F (36.4 ?C)  ? ?BP Readings from Last 3 Encounters:  ?01/21/22 132/73  ?01/21/22 132/73  ?01/11/22 137/90  ? ?Pulse Readings from Last 3 Encounters:  ?01/21/22 67  ?01/21/22 67  ?01/11/22 (!) 107  ?  ? ?In general this is a well appearing caucasian female in no acute distress. She's alert and oriented x4 and appropriate throughout the examination. Cardiopulmonary assessment is negative for acute distress and she exhibits normal effort.  ? ? ? ?ECOG = 1 ? ?0 - Asymptomatic (Fully active, able to carry on all predisease activities without restriction) ? ?1 - Symptomatic but completely ambulatory (Restricted in physically strenuous activity but ambulatory and able to carry out work of a light or sedentary nature. For example, light housework, office work) ? ?2 - Symptomatic, <50% in bed during the day (Ambulatory and capable of all self care but unable to carry out any work activities. Up and about more than 50% of waking hours) ? ?3 - Symptomatic, >50% in bed, but not bedbound (Capable of only limited self-care, confined to bed or chair 50% or more of waking hours) ? ?4 - Bedbound (Completely disabled. Cannot carry on any self-care. Totally confined to bed or chair) ? ?5 - Death ? ? Oken MM,  Creech RH, Tormey DC, et al. [phone]). "Toxicity and response criteria of the [facility]". [place] Oncol. 5 (6): 649-55 ? ? ? ?LABORATORY DATA:  ?Lab Results  ?Component Value Date  ? WBC 12.2 (H) 01/08/2022  ? HGB 10.5 (L) 01/08/2022  ? HCT 31.1 (L) 01/08/2022  ? MCV 97.2 01/08/2022  ? PLT 295 01/08/2022  ? ?Lab Results  ?Component Value Date  ? NA 135 01/08/2022  ? K 4.0 01/08/2022  ?  CL 107 01/08/2022  ? CO2 22 01/08/2022  ? ?Lab Results  ?Component Value Date  ? ALT 15 01/06/2022  ? AST 14 (L) 01/06/2022  ? ALKPHOS 62 01/06/2022  ? BILITOT 0.6 01/06/2022  ? ?  ? ?RADIOGRAPHY: CT HEAD WO CONTRAST (5MM) ? ?Result Date: 01/05/2022 ?CLINICAL DATA:  Mental status change EXAM: CT HEAD WITHOUT CONTRAST TECHNIQUE: Contiguous axial images were obtained from the base of the skull through the vertex without intravenous contrast. RADIATION DOSE REDUCTION: This exam was performed according to the departmental dose-optimization program which includes automated exposure control, adjustment of the mA and/or kV according to patient size and/or use of iterative reconstruction technique. COMPARISON:  CT brain 04/06/2018 FINDINGS: Brain: Large mass within the left frontal lobe measuring 4.2 x 3.8 x 2.9 cm with considerable surrounding edema and local mass effect. Sub fall seen herniation. About 11 mm midline shift to the right at the level of the mass. Mass effect on the anterior aspects of both lateral ventricles. Central hyperdensity within the mass could reflect a small amount of hemorrhage. Vascular: No hyperdense vessels.  Carotid vascular calcification Skull: Normal. Negative for fracture or focal lesion. Sinuses/Orbits: No acute finding. Other: None IMPRESSION: 1. Large 4.2 cm heterogenous mass within the left frontal lobe with slight increased internal density possibly due to a small amount of hemorrhage within the mass. Mass is associated with considerable edema and local mass effect. Positive for  subfalcine herniation and about 11-12 mm midline shift to the right at the level of the mass. Compression of the anterior aspects of the lateral ventricles. Critical Value/emergent results were called by

## 2022-01-21 NOTE — Progress Notes (Signed)
Location/Histology of Brain Tumor: Left Frontal ? ?Patient presented with symptoms of confusion and changes in behavior for several weeks.  Son noticed impairment in her memory and forgetfulness (leaving the stove on, forgetting how to open the car door).  More recently she has been off balance and falling to the right side. ? ?MRI Brain 01/05/2022: 4.2 x 4.0 x 4.5 cm heterogeneous intra-axial mass at the anterior inferior left frontal lobe with associated regional mass effect and up to 2 cm of left-to-right shift anteriorly. Finding most concerning for a primary CNS neoplasm, high-grade glioma/GBM. ? ?CT Head 01/05/2022: Large 4.2 cm heterogenous mass within the left frontal lobe with slight increased internal density possibly due to a small amount of hemorrhage within the mass. Mass is associated with considerable edema and local mass effect. Positive for subfalcine herniation and about 11-12 mm midline shift to the right at the level of the mass.  Compression of the anterior aspects of the lateral ventricles. ? ?Past or anticipated interventions, if any, per neurosurgery:  ? ?Past or anticipated interventions, if any, per medical oncology:  ?Dr. Mickeal Skinner 01/11/2022 ?- She presents with clinical and radiographic syndrome consistent with left frontal primary brain neoplasm.  Likely etiology is high grade glioma, glioblastoma.   ?-We had an extensive conversation with [name] and her son regarding likely pathology, prognosis, and available treatment pathways.  They understand this condition is incurable, and prognosis is limited even with aggressive treatments. ?-We discussed and offered and abbreviated course (3 weeks) of intensity modulated radiation therapy with our without concurrent daily Temozolomide.  Radiation will be administered Mon-Fri over 3 weeks, Temodar will be dosed at '75mg'$ /m2 to be given daily over 42 days.  ?-The other option would be transition to comfort care only, hospice. ?-Etheleen Sia Hawe and  her son will continue the goals of care discussion at home, and let us know what their preferred path forward is.  We are happy to continue the discussion in person or via phone if there are additional questions. ? ? ?Dose of Decadron, if applicable: 4 mg daily ? ? ? ?Recent neurologic symptoms, if any:  ?Seizures: no ?Headaches: no ?Nausea: no ?Dizziness/ataxia:no ?Difficulty with hand coordination: yes,due to arthritis ?Focal numbness/weakness: no ?Visual deficits/changes: no ?Confusion/Memory deficits: yes,difficulty with memory per son ? ? ? ?SAFETY ISSUES: ?Prior radiation? no ?Pacemaker/ICD? no ?Possible current pregnancy? Postmenopausal ?Is the patient on methotrexate? no ? ?Additional Complaints / other details:  ?-Patient will start Palliative Care until radiation treatments are complete then she will transition to Hospice. ?Vitals:  ? 01/21/22 0829  ?BP: 132/73  ?Pulse: 67  ?Resp: 20  ?Temp: 97.7 ?F (36.5 ?C)  ?TempSrc: Oral  ?SpO2: 98%  ?Weight: 54 kg  ? ? ?

## 2022-01-22 ENCOUNTER — Encounter: Payer: Self-pay | Admitting: Licensed Clinical Social Worker

## 2022-01-22 ENCOUNTER — Ambulatory Visit
Admission: RE | Admit: 2022-01-22 | Discharge: 2022-01-22 | Disposition: A | Discharge status: Home / Self Care | Payer: PPO | Source: Ambulatory Visit | Attending: Radiation Oncology | Admitting: Radiation Oncology

## 2022-01-22 VITALS — BP 131/80 | HR 61 | Temp 98.1°F | Resp 18

## 2022-01-22 DIAGNOSIS — Z510 Encounter for antineoplastic radiation therapy: Secondary | ICD-10-CM | POA: Insufficient documentation

## 2022-01-22 DIAGNOSIS — C711 Malignant neoplasm of frontal lobe: Secondary | ICD-10-CM | POA: Diagnosis not present

## 2022-01-22 DIAGNOSIS — G9389 Other specified disorders of brain: Secondary | ICD-10-CM

## 2022-01-22 MED ORDER — SODIUM CHLORIDE 0.9% FLUSH
10.0000 mL | Freq: Once | INTRAVENOUS | Status: AC
  Administered 2022-01-22: 10 mL via INTRAVENOUS

## 2022-01-22 NOTE — Progress Notes (Signed)
Has armband been applied?  Yes ? ?Does patient have an allergy to IV contrast dye?: No ?  ?Has patient ever received premedication for IV contrast dye?:  n/a ? ?Does patient take metformin?: No  ? ?If patient does take metformin when was the last dose: n/a ? ?Date of lab work: 01/08/2022 ?BUN: 33 ?CR: 0.67 ?eGfr: >60  ? ?IV site: Left Forearm ? ?Has IV site been added to flowsheet?  Yes ? ?BP 131/80   Pulse 61   Temp 98.1 ?F (36.7 ?C)   Resp 18   SpO2 96%   ? ?Gloriajean Dell. Leonie Green, BSN  ?

## 2022-01-22 NOTE — Progress Notes (Signed)
[facility] Clinical Social Work  ?Initial Assessment ? ? ?Kathryn [name] is a 88 y.o. year old female contacted by phone. Clinical Social Work was referred by nurse navigator for assessment of psychosocial needs.  ? ?SDOH (Social Determinants of Health) assessments performed: Yes ?SDOH Interventions   ? ?Flowsheet Row Most Recent Value  ?SDOH Interventions   ?Food Insecurity Interventions Intervention Not Indicated  ?Financial Strain Interventions Intervention Not Indicated  ?Housing Interventions Intervention Not Indicated  ?Intimate Partner Violence Interventions Intervention Not Indicated  ?Physical Activity Interventions Intervention Not Indicated  ?Stress Interventions Intervention Not Indicated  ?Social Connections Interventions Intervention Not Indicated  ?Transportation Interventions Intervention Not Indicated, Patient Resources (Friends/Family)  ? ?  ?  ?Distress Screen completed: No ? ?  01/21/2022  ?  8:31 AM  ?ONCBCN DISTRESS SCREENING  ?Screening Type Initial Screening  ?Distress experienced in past week (1-10) 0  ? ? ? ? ?Family/Social Information:  ?Housing Arrangement: patient lives with son and primary caregiver  Kathryn [name] (Son)  ?[phone] (Mobile) ?Family members/support persons in your life? Family, Medical Staff, Friends/Colleagues, and Community  ?Transportation concerns: no  ?Employment: Retired. Income source: [employer] ?Financial concerns: No ?Type of concern: None ?Food access concerns: no ?Religious or spiritual practice: N/A ?Services Currently in place:  [facility]/[facility] ? ?Coping/ Adjustment to diagnosis: ?Patient understands treatment plan and what happens next? yes, but Kathryn [name] is concerns and would like to know what to expect, as far as care needs are concerned, when patient completes radiation treatment. ?Concerns about diagnosis and/or treatment: Pain or discomfort during procedures ?Patient reported stressors: Adjusting to my illness, Physical  issues, and daily care needs. ?Hopes and priorities: N/A ?Patient enjoys time with family/ friends ?Current coping skills/ strengths: Financial means  and Supportive family/friends  ? ? ? SUMMARY: ?Current SDOH Barriers:  ?Limited social support and patient's main support is her son Kathryn [name], who is feeling overwhelmed with the demands and would like information on care givers.  CSW sent Kathryn [name] an email with a list of private duty care givers in the are to [email or web address] ? ?Clinical Social Work Clinical Goal(s):  ?patient will work with SW to address concerns related to adjustment of illness for caregiver and stress due to current care needs for patient. ? ?Interventions: ?Discussed common feeling and emotions when being diagnosed with cancer, and the importance of support during treatment ?Informed patient of the support team roles and support services at [facility] ?Provided CSW contact information and encouraged patient to call with any questions or concerns ?Provided patient with information about CSW role in patient care and discussed other available resources.  CSW gave patient Kathryn [name] contact information to schedule an appointment for counseling. CSW stated I would also contact [employer] and find out if patient had RNCM that may assist with care need resources. ? ? ?Follow Up Plan: CSW will follow-up with patient by phone  ?Patient verbalizes understanding of plan: Yes ? ? ? ?Magali Bray, LCSW ?

## 2022-01-26 DIAGNOSIS — C711 Malignant neoplasm of frontal lobe: Secondary | ICD-10-CM | POA: Diagnosis not present

## 2022-01-26 DIAGNOSIS — Z510 Encounter for antineoplastic radiation therapy: Secondary | ICD-10-CM | POA: Insufficient documentation

## 2022-01-27 ENCOUNTER — Telehealth: Payer: Self-pay | Admitting: Internal Medicine

## 2022-01-27 ENCOUNTER — Other Ambulatory Visit: Payer: Self-pay

## 2022-01-27 ENCOUNTER — Ambulatory Visit
Admission: RE | Admit: 2022-01-27 | Discharge: 2022-01-27 | Disposition: A | Discharge status: Home / Self Care | Payer: PPO | Source: Ambulatory Visit | Attending: Radiation Oncology | Admitting: Radiation Oncology

## 2022-01-27 DIAGNOSIS — M6281 Muscle weakness (generalized): Secondary | ICD-10-CM | POA: Diagnosis not present

## 2022-01-27 DIAGNOSIS — N390 Urinary tract infection, site not specified: Secondary | ICD-10-CM | POA: Diagnosis not present

## 2022-01-27 DIAGNOSIS — R2681 Unsteadiness on feet: Secondary | ICD-10-CM | POA: Diagnosis not present

## 2022-01-27 DIAGNOSIS — D496 Neoplasm of unspecified behavior of brain: Secondary | ICD-10-CM | POA: Diagnosis not present

## 2022-01-27 DIAGNOSIS — Z510 Encounter for antineoplastic radiation therapy: Secondary | ICD-10-CM | POA: Diagnosis not present

## 2022-01-27 DIAGNOSIS — C711 Malignant neoplasm of frontal lobe: Secondary | ICD-10-CM | POA: Diagnosis not present

## 2022-01-27 DIAGNOSIS — D539 Nutritional anemia, unspecified: Secondary | ICD-10-CM | POA: Diagnosis not present

## 2022-01-27 LAB — RAD ONC ARIA SESSION SUMMARY
Course Elapsed Days: 0
Plan Fractions Treated to Date: 1
Plan Prescribed Dose Per Fraction: 2.67 Gy
Plan Total Fractions Prescribed: 15
Plan Total Prescribed Dose: 40.05 Gy
Reference Point Dosage Given to Date: 2.67 Gy
Reference Point Session Dosage Given: 2.67 Gy
Session Number: 1

## 2022-01-27 NOTE — Telephone Encounter (Signed)
.  Called patient to schedule appointment per 5/2 inbasket, patient is aware of date and time.   ?

## 2022-01-28 ENCOUNTER — Ambulatory Visit
Admission: RE | Admit: 2022-01-28 | Discharge: 2022-01-28 | Disposition: A | Discharge status: Home / Self Care | Payer: PPO | Source: Ambulatory Visit | Attending: Radiation Oncology | Admitting: Radiation Oncology

## 2022-01-28 ENCOUNTER — Other Ambulatory Visit: Payer: Self-pay

## 2022-01-28 ENCOUNTER — Ambulatory Visit: Payer: PPO | Admitting: Radiation Oncology

## 2022-01-28 DIAGNOSIS — Z510 Encounter for antineoplastic radiation therapy: Secondary | ICD-10-CM | POA: Diagnosis not present

## 2022-01-28 DIAGNOSIS — C711 Malignant neoplasm of frontal lobe: Secondary | ICD-10-CM | POA: Diagnosis not present

## 2022-01-28 LAB — RAD ONC ARIA SESSION SUMMARY
Course Elapsed Days: 1
Plan Fractions Treated to Date: 2
Plan Prescribed Dose Per Fraction: 2.67 Gy
Plan Total Fractions Prescribed: 15
Plan Total Prescribed Dose: 40.05 Gy
Reference Point Dosage Given to Date: 5.34 Gy
Reference Point Session Dosage Given: 2.67 Gy
Session Number: 2

## 2022-01-29 ENCOUNTER — Other Ambulatory Visit: Payer: Self-pay

## 2022-01-29 ENCOUNTER — Ambulatory Visit
Admission: RE | Admit: 2022-01-29 | Discharge: 2022-01-29 | Disposition: A | Discharge status: Home / Self Care | Payer: PPO | Source: Ambulatory Visit | Attending: Radiation Oncology | Admitting: Radiation Oncology

## 2022-01-29 DIAGNOSIS — Z510 Encounter for antineoplastic radiation therapy: Secondary | ICD-10-CM | POA: Diagnosis not present

## 2022-01-29 DIAGNOSIS — C711 Malignant neoplasm of frontal lobe: Secondary | ICD-10-CM | POA: Diagnosis not present

## 2022-01-29 LAB — RAD ONC ARIA SESSION SUMMARY
Course Elapsed Days: 2
Plan Fractions Treated to Date: 3
Plan Prescribed Dose Per Fraction: 2.67 Gy
Plan Total Fractions Prescribed: 15
Plan Total Prescribed Dose: 40.05 Gy
Reference Point Dosage Given to Date: 8.01 Gy
Reference Point Session Dosage Given: 2.67 Gy
Session Number: 3

## 2022-02-01 ENCOUNTER — Other Ambulatory Visit: Payer: Self-pay

## 2022-02-01 ENCOUNTER — Ambulatory Visit
Admission: RE | Admit: 2022-02-01 | Discharge: 2022-02-01 | Disposition: A | Discharge status: Home / Self Care | Payer: PPO | Source: Ambulatory Visit | Attending: Radiation Oncology | Admitting: Radiation Oncology

## 2022-02-01 DIAGNOSIS — C711 Malignant neoplasm of frontal lobe: Secondary | ICD-10-CM | POA: Diagnosis not present

## 2022-02-01 DIAGNOSIS — Z510 Encounter for antineoplastic radiation therapy: Secondary | ICD-10-CM | POA: Diagnosis not present

## 2022-02-01 LAB — RAD ONC ARIA SESSION SUMMARY
Course Elapsed Days: 5
Plan Fractions Treated to Date: 4
Plan Prescribed Dose Per Fraction: 2.67 Gy
Plan Total Fractions Prescribed: 15
Plan Total Prescribed Dose: 40.05 Gy
Reference Point Dosage Given to Date: 10.68 Gy
Reference Point Session Dosage Given: 2.67 Gy
Session Number: 4

## 2022-02-02 ENCOUNTER — Ambulatory Visit
Admission: RE | Admit: 2022-02-02 | Discharge: 2022-02-02 | Disposition: A | Discharge status: Home / Self Care | Payer: PPO | Source: Ambulatory Visit | Attending: Radiation Oncology | Admitting: Radiation Oncology

## 2022-02-02 ENCOUNTER — Other Ambulatory Visit: Payer: Self-pay

## 2022-02-02 DIAGNOSIS — C711 Malignant neoplasm of frontal lobe: Secondary | ICD-10-CM | POA: Diagnosis not present

## 2022-02-02 DIAGNOSIS — Z510 Encounter for antineoplastic radiation therapy: Secondary | ICD-10-CM | POA: Diagnosis not present

## 2022-02-02 LAB — RAD ONC ARIA SESSION SUMMARY
Course Elapsed Days: 6
Plan Fractions Treated to Date: 5
Plan Prescribed Dose Per Fraction: 2.67 Gy
Plan Total Fractions Prescribed: 15
Plan Total Prescribed Dose: 40.05 Gy
Reference Point Dosage Given to Date: 13.35 Gy
Reference Point Session Dosage Given: 2.67 Gy
Session Number: 5

## 2022-02-03 ENCOUNTER — Ambulatory Visit
Admission: RE | Admit: 2022-02-03 | Discharge: 2022-02-03 | Disposition: A | Discharge status: Home / Self Care | Payer: PPO | Source: Ambulatory Visit | Attending: Radiation Oncology | Admitting: Radiation Oncology

## 2022-02-03 ENCOUNTER — Other Ambulatory Visit: Payer: Self-pay

## 2022-02-03 DIAGNOSIS — Z510 Encounter for antineoplastic radiation therapy: Secondary | ICD-10-CM | POA: Diagnosis not present

## 2022-02-03 DIAGNOSIS — C711 Malignant neoplasm of frontal lobe: Secondary | ICD-10-CM | POA: Diagnosis not present

## 2022-02-03 LAB — RAD ONC ARIA SESSION SUMMARY
Course Elapsed Days: 7
Plan Fractions Treated to Date: 6
Plan Prescribed Dose Per Fraction: 2.67 Gy
Plan Total Fractions Prescribed: 15
Plan Total Prescribed Dose: 40.05 Gy
Reference Point Dosage Given to Date: 16.02 Gy
Reference Point Session Dosage Given: 2.67 Gy
Session Number: 6

## 2022-02-04 ENCOUNTER — Ambulatory Visit
Admission: RE | Admit: 2022-02-04 | Discharge: 2022-02-04 | Disposition: A | Discharge status: Home / Self Care | Payer: PPO | Source: Ambulatory Visit | Attending: Radiation Oncology | Admitting: Radiation Oncology

## 2022-02-04 ENCOUNTER — Other Ambulatory Visit: Payer: Self-pay

## 2022-02-04 DIAGNOSIS — C711 Malignant neoplasm of frontal lobe: Secondary | ICD-10-CM | POA: Diagnosis not present

## 2022-02-04 DIAGNOSIS — Z510 Encounter for antineoplastic radiation therapy: Secondary | ICD-10-CM | POA: Diagnosis not present

## 2022-02-04 LAB — RAD ONC ARIA SESSION SUMMARY
Course Elapsed Days: 8
Plan Fractions Treated to Date: 7
Plan Prescribed Dose Per Fraction: 2.67 Gy
Plan Total Fractions Prescribed: 15
Plan Total Prescribed Dose: 40.05 Gy
Reference Point Dosage Given to Date: 18.69 Gy
Reference Point Session Dosage Given: 2.67 Gy
Session Number: 7

## 2022-02-05 ENCOUNTER — Ambulatory Visit
Admission: RE | Admit: 2022-02-05 | Discharge: 2022-02-05 | Disposition: A | Discharge status: Home / Self Care | Payer: PPO | Source: Ambulatory Visit | Attending: Radiation Oncology | Admitting: Radiation Oncology

## 2022-02-05 ENCOUNTER — Other Ambulatory Visit: Payer: Self-pay

## 2022-02-05 DIAGNOSIS — Z510 Encounter for antineoplastic radiation therapy: Secondary | ICD-10-CM | POA: Diagnosis not present

## 2022-02-05 DIAGNOSIS — C711 Malignant neoplasm of frontal lobe: Secondary | ICD-10-CM | POA: Diagnosis not present

## 2022-02-05 LAB — RAD ONC ARIA SESSION SUMMARY
Course Elapsed Days: 9
Plan Fractions Treated to Date: 8
Plan Prescribed Dose Per Fraction: 2.67 Gy
Plan Total Fractions Prescribed: 15
Plan Total Prescribed Dose: 40.05 Gy
Reference Point Dosage Given to Date: 21.36 Gy
Reference Point Session Dosage Given: 2.67 Gy
Session Number: 8

## 2022-02-08 ENCOUNTER — Ambulatory Visit
Admission: RE | Admit: 2022-02-08 | Discharge: 2022-02-08 | Disposition: A | Discharge status: Home / Self Care | Payer: PPO | Source: Ambulatory Visit | Attending: Radiation Oncology | Admitting: Radiation Oncology

## 2022-02-08 ENCOUNTER — Other Ambulatory Visit: Payer: Self-pay

## 2022-02-08 DIAGNOSIS — C711 Malignant neoplasm of frontal lobe: Secondary | ICD-10-CM | POA: Diagnosis not present

## 2022-02-08 DIAGNOSIS — Z510 Encounter for antineoplastic radiation therapy: Secondary | ICD-10-CM | POA: Diagnosis not present

## 2022-02-08 LAB — RAD ONC ARIA SESSION SUMMARY
Course Elapsed Days: 12
Plan Fractions Treated to Date: 9
Plan Prescribed Dose Per Fraction: 2.67 Gy
Plan Total Fractions Prescribed: 15
Plan Total Prescribed Dose: 40.05 Gy
Reference Point Dosage Given to Date: 24.03 Gy
Reference Point Session Dosage Given: 2.67 Gy
Session Number: 9

## 2022-02-09 ENCOUNTER — Ambulatory Visit
Admission: RE | Admit: 2022-02-09 | Discharge: 2022-02-09 | Disposition: A | Discharge status: Home / Self Care | Payer: PPO | Source: Ambulatory Visit | Attending: Radiation Oncology | Admitting: Radiation Oncology

## 2022-02-09 ENCOUNTER — Other Ambulatory Visit: Payer: Self-pay

## 2022-02-09 DIAGNOSIS — C711 Malignant neoplasm of frontal lobe: Secondary | ICD-10-CM | POA: Diagnosis not present

## 2022-02-09 DIAGNOSIS — Z510 Encounter for antineoplastic radiation therapy: Secondary | ICD-10-CM | POA: Diagnosis not present

## 2022-02-09 LAB — RAD ONC ARIA SESSION SUMMARY
Course Elapsed Days: 13
Plan Fractions Treated to Date: 10
Plan Prescribed Dose Per Fraction: 2.67 Gy
Plan Total Fractions Prescribed: 15
Plan Total Prescribed Dose: 40.05 Gy
Reference Point Dosage Given to Date: 26.7 Gy
Reference Point Session Dosage Given: 2.67 Gy
Session Number: 10

## 2022-02-10 ENCOUNTER — Ambulatory Visit
Admission: RE | Admit: 2022-02-10 | Discharge: 2022-02-10 | Disposition: A | Discharge status: Home / Self Care | Payer: PPO | Source: Ambulatory Visit | Attending: Radiation Oncology | Admitting: Radiation Oncology

## 2022-02-10 ENCOUNTER — Other Ambulatory Visit: Payer: Self-pay

## 2022-02-10 DIAGNOSIS — C711 Malignant neoplasm of frontal lobe: Secondary | ICD-10-CM | POA: Diagnosis not present

## 2022-02-10 DIAGNOSIS — Z510 Encounter for antineoplastic radiation therapy: Secondary | ICD-10-CM | POA: Diagnosis not present

## 2022-02-10 LAB — RAD ONC ARIA SESSION SUMMARY
Course Elapsed Days: 14
Plan Fractions Treated to Date: 11
Plan Prescribed Dose Per Fraction: 2.67 Gy
Plan Total Fractions Prescribed: 15
Plan Total Prescribed Dose: 40.05 Gy
Reference Point Dosage Given to Date: 29.37 Gy
Reference Point Session Dosage Given: 2.67 Gy
Session Number: 11

## 2022-02-11 ENCOUNTER — Inpatient Hospital Stay: Payer: PPO | Admitting: Internal Medicine

## 2022-02-11 ENCOUNTER — Other Ambulatory Visit: Payer: Self-pay

## 2022-02-11 ENCOUNTER — Ambulatory Visit
Admission: RE | Admit: 2022-02-11 | Discharge: 2022-02-11 | Disposition: A | Discharge status: Home / Self Care | Payer: PPO | Source: Ambulatory Visit | Attending: Radiation Oncology | Admitting: Radiation Oncology

## 2022-02-11 ENCOUNTER — Telehealth: Payer: Self-pay | Admitting: Internal Medicine

## 2022-02-11 ENCOUNTER — Inpatient Hospital Stay: Payer: PPO | Admitting: Nurse Practitioner

## 2022-02-11 DIAGNOSIS — C711 Malignant neoplasm of frontal lobe: Secondary | ICD-10-CM | POA: Diagnosis not present

## 2022-02-11 DIAGNOSIS — Z510 Encounter for antineoplastic radiation therapy: Secondary | ICD-10-CM | POA: Diagnosis not present

## 2022-02-11 LAB — RAD ONC ARIA SESSION SUMMARY
Course Elapsed Days: 15
Plan Fractions Treated to Date: 12
Plan Prescribed Dose Per Fraction: 2.67 Gy
Plan Total Fractions Prescribed: 15
Plan Total Prescribed Dose: 40.05 Gy
Reference Point Dosage Given to Date: 32.04 Gy
Reference Point Session Dosage Given: 2.67 Gy
Session Number: 12

## 2022-02-11 NOTE — Telephone Encounter (Signed)
Per 5/18 sch msg called pt son about misses appointment   son confirmed appointment

## 2022-02-12 ENCOUNTER — Ambulatory Visit
Admission: RE | Admit: 2022-02-12 | Discharge: 2022-02-12 | Disposition: A | Discharge status: Home / Self Care | Payer: PPO | Source: Ambulatory Visit | Attending: Radiation Oncology | Admitting: Radiation Oncology

## 2022-02-12 ENCOUNTER — Other Ambulatory Visit: Payer: Self-pay

## 2022-02-12 DIAGNOSIS — C711 Malignant neoplasm of frontal lobe: Secondary | ICD-10-CM | POA: Diagnosis not present

## 2022-02-12 DIAGNOSIS — Z510 Encounter for antineoplastic radiation therapy: Secondary | ICD-10-CM | POA: Diagnosis not present

## 2022-02-12 LAB — RAD ONC ARIA SESSION SUMMARY
Course Elapsed Days: 16
Plan Fractions Treated to Date: 13
Plan Prescribed Dose Per Fraction: 2.67 Gy
Plan Total Fractions Prescribed: 15
Plan Total Prescribed Dose: 40.05 Gy
Reference Point Dosage Given to Date: 34.71 Gy
Reference Point Session Dosage Given: 2.67 Gy
Session Number: 13

## 2022-02-15 ENCOUNTER — Other Ambulatory Visit: Payer: Self-pay

## 2022-02-15 ENCOUNTER — Ambulatory Visit
Admission: RE | Admit: 2022-02-15 | Discharge: 2022-02-15 | Disposition: A | Discharge status: Home / Self Care | Payer: PPO | Source: Ambulatory Visit | Attending: Radiation Oncology | Admitting: Radiation Oncology

## 2022-02-15 DIAGNOSIS — Z510 Encounter for antineoplastic radiation therapy: Secondary | ICD-10-CM | POA: Diagnosis not present

## 2022-02-15 DIAGNOSIS — C711 Malignant neoplasm of frontal lobe: Secondary | ICD-10-CM | POA: Diagnosis not present

## 2022-02-15 LAB — RAD ONC ARIA SESSION SUMMARY
Course Elapsed Days: 19
Plan Fractions Treated to Date: 14
Plan Prescribed Dose Per Fraction: 2.67 Gy
Plan Total Fractions Prescribed: 15
Plan Total Prescribed Dose: 40.05 Gy
Reference Point Dosage Given to Date: 37.38 Gy
Reference Point Session Dosage Given: 2.67 Gy
Session Number: 14

## 2022-02-16 ENCOUNTER — Other Ambulatory Visit: Payer: Self-pay

## 2022-02-16 ENCOUNTER — Encounter: Payer: Self-pay | Admitting: Radiation Oncology

## 2022-02-16 ENCOUNTER — Ambulatory Visit
Admission: RE | Admit: 2022-02-16 | Discharge: 2022-02-16 | Disposition: A | Discharge status: Home / Self Care | Payer: PPO | Source: Ambulatory Visit | Attending: Radiation Oncology | Admitting: Radiation Oncology

## 2022-02-16 ENCOUNTER — Other Ambulatory Visit (HOSPITAL_COMMUNITY): Payer: Self-pay

## 2022-02-16 ENCOUNTER — Ambulatory Visit: Payer: PPO

## 2022-02-16 DIAGNOSIS — C711 Malignant neoplasm of frontal lobe: Secondary | ICD-10-CM | POA: Diagnosis not present

## 2022-02-16 DIAGNOSIS — Z510 Encounter for antineoplastic radiation therapy: Secondary | ICD-10-CM | POA: Diagnosis not present

## 2022-02-16 LAB — RAD ONC ARIA SESSION SUMMARY
Course Elapsed Days: 20
Plan Fractions Treated to Date: 15
Plan Prescribed Dose Per Fraction: 2.67 Gy
Plan Total Fractions Prescribed: 15
Plan Total Prescribed Dose: 40.05 Gy
Reference Point Dosage Given to Date: 40.05 Gy
Reference Point Session Dosage Given: 2.67 Gy
Session Number: 15

## 2022-02-17 ENCOUNTER — Ambulatory Visit: Payer: PPO

## 2022-02-18 ENCOUNTER — Inpatient Hospital Stay: Payer: PPO | Attending: Neurological Surgery | Admitting: Internal Medicine

## 2022-02-18 ENCOUNTER — Encounter: Payer: Self-pay | Admitting: Nurse Practitioner

## 2022-02-18 ENCOUNTER — Telehealth: Payer: Self-pay | Admitting: Internal Medicine

## 2022-02-18 ENCOUNTER — Other Ambulatory Visit: Payer: Self-pay

## 2022-02-18 ENCOUNTER — Inpatient Hospital Stay (HOSPITAL_BASED_OUTPATIENT_CLINIC_OR_DEPARTMENT_OTHER): Payer: PPO | Admitting: Nurse Practitioner

## 2022-02-18 VITALS — BP 130/71 | HR 61 | Temp 97.5°F | Resp 18 | Wt 118.3 lb

## 2022-02-18 DIAGNOSIS — R4189 Other symptoms and signs involving cognitive functions and awareness: Secondary | ICD-10-CM | POA: Diagnosis not present

## 2022-02-18 DIAGNOSIS — G9389 Other specified disorders of brain: Secondary | ICD-10-CM

## 2022-02-18 DIAGNOSIS — R531 Weakness: Secondary | ICD-10-CM | POA: Diagnosis not present

## 2022-02-18 DIAGNOSIS — Z803 Family history of malignant neoplasm of breast: Secondary | ICD-10-CM | POA: Diagnosis not present

## 2022-02-18 DIAGNOSIS — E785 Hyperlipidemia, unspecified: Secondary | ICD-10-CM | POA: Diagnosis not present

## 2022-02-18 DIAGNOSIS — Z808 Family history of malignant neoplasm of other organs or systems: Secondary | ICD-10-CM | POA: Insufficient documentation

## 2022-02-18 DIAGNOSIS — D496 Neoplasm of unspecified behavior of brain: Secondary | ICD-10-CM | POA: Diagnosis not present

## 2022-02-18 DIAGNOSIS — Z515 Encounter for palliative care: Secondary | ICD-10-CM | POA: Diagnosis not present

## 2022-02-18 DIAGNOSIS — Z7189 Other specified counseling: Secondary | ICD-10-CM | POA: Diagnosis not present

## 2022-02-18 DIAGNOSIS — Z800 Family history of malignant neoplasm of digestive organs: Secondary | ICD-10-CM | POA: Insufficient documentation

## 2022-02-18 NOTE — Progress Notes (Signed)
[facility]  Telephone:(336) [phone] Fax:(336) [phone]   Name: MARGARETA LAUREANO Date: 02/18/2022 MRN: [id number]  DOB: 16-Sep-1931  Patient Care Team: Jinny Sanders, MD as PCP - General Marica Otter, OD as Referring Physician (Optometry)    REASON FOR CONSULTATION: Kathryn [name] is a 88 y.o. female with medical history including high grade glioblastoma and hyperlipidemia.  Palliative ask to see for symptom management and goals of care.    SOCIAL HISTORY:     reports that she has never smoked. She has never used smokeless tobacco. She reports current alcohol use of about 3.0 standard drinks per week. She reports that she does not use drugs.  ADVANCE DIRECTIVES:  Patient has a documented advanced directive, however not on file. Confirms her son, Legrand Como is her Scientist, research (medical).   CODE STATUS: Full code  PAST MEDICAL HISTORY: Past Medical History:  Diagnosis Date   Allergy    Arthritis    Colon polyps    History of blood transfusion    with previous surgeries   Hyperlipidemia    Irregular heart rate    PONV (postoperative nausea and vomiting)    hx of pt. states she thinks was from pain med.    PAST SURGICAL HISTORY:  Past Surgical History:  Procedure Laterality Date   CARDIAC CATHETERIZATION     HAND SURGERY Left    TOTAL HIP ARTHROPLASTY  2007   LEFT   TOTAL HIP ARTHROPLASTY Right 10/13/2016   Procedure: RIGHT TOTAL HIP ARTHROPLASTY ANTERIOR APPROACH;  Surgeon: Gaynelle Arabian, MD;  Location: [facility];  Service: Orthopedics;  Laterality: Right;   TOTAL KNEE ARTHROPLASTY     Right    TOTAL KNEE ARTHROPLASTY Left 11/24/2020   Procedure: TOTAL KNEE ARTHROPLASTY;  Surgeon: Gaynelle Arabian, MD;  Location: [facility];  Service: Orthopedics;  Laterality: Left;  78mn   TUBAL LIGATION      HEMATOLOGY/ONCOLOGY HISTORY:  Oncology History   No history exists.    ALLERGIES:  is allergic to macrobid [nitrofurantoin monohyd  macro].  MEDICATIONS:  Current Outpatient Medications  Medication Sig Dispense Refill   Ascorbic Acid (VITAMIN C) 1000 MG tablet Take 1,000 mg by mouth daily.     Biotin 10 MG TABS Take 10 mg by mouth daily.     cetirizine (ZYRTEC) 10 MG tablet TAKE 1 TABLET DAILY 90 tablet 3   Cyanocobalamin (B-12) 2500 MCG TABS Take 2,500 mcg by mouth daily.     dexamethasone (DECADRON) 4 MG tablet Take 1 tablet (4 mg total) by mouth every 12 (twelve) hours. 60 tablet 0   zinc gluconate 50 MG tablet Take 50 mg by mouth daily.     No current facility-administered medications for this visit.    VITAL SIGNS: There were no vitals taken for this visit. There were no vitals filed for this visit.  Estimated body mass index is 18.53 kg/m as calculated from the following:   Height as of 01/21/22: '5\' 7"'$  (1.702 m).   Weight as of an earlier encounter on 02/18/22: 118 lb 4.8 oz (53.7 kg).  LABS: CBC:    Component Value Date/Time   WBC 12.2 (H) 01/08/2022 0435   HGB 10.5 (L) 01/08/2022 0435   HCT 31.1 (L) 01/08/2022 0435   PLT 295 01/08/2022 0435   MCV 97.2 01/08/2022 0435   NEUTROABS 6.5 01/05/2022 1919   LYMPHSABS 0.8 01/05/2022 1919   MONOABS 0.6 01/05/2022 1919   EOSABS 0.1 01/05/2022 1919  BASOSABS 0.0 01/05/2022 1919   Comprehensive Metabolic Panel:    Component Value Date/Time   NA 135 01/08/2022 0435   K 4.0 01/08/2022 0435   CL 107 01/08/2022 0435   CO2 22 01/08/2022 0435   BUN 33 (H) 01/08/2022 0435   CREATININE 0.67 01/08/2022 0435   CREATININE 0.66 09/28/2016 1007   GLUCOSE 123 (H) 01/08/2022 0435   CALCIUM 9.0 01/08/2022 0435   AST 14 (L) 01/06/2022 0418   ALT 15 01/06/2022 0418   ALKPHOS 62 01/06/2022 0418   BILITOT 0.6 01/06/2022 0418   PROT 6.7 01/06/2022 0418   ALBUMIN 2.8 (L) 01/07/2022 0305    RADIOGRAPHIC STUDIES: No results found.  PERFORMANCE STATUS (ECOG) : 1 - Symptomatic but completely ambulatory  Review of Systems  Neurological:  Positive for weakness.        Memory deficit   Unless otherwise noted, a complete review of systems is negative.  Physical Exam General: NAD, in a wheelchair Cardiovascular: regular rate and rhythm Pulmonary: normal breathing pattern Abdomen: soft, nontender, + bowel sounds Extremities: no edema, no joint deformities Skin: no rashes Neurological: AAO with some impaired insight   IMPRESSION: This is my initial visit with Ms. Fogel. Her son, Legrand Como is also present for support. No acute distress noted. She is in a wheelchair. Able to engage in discussions with some impairment noted.   I introduced myself, RN, and Palliative's role in collaboration with the oncology team. Concept of Palliative Care was introduced as specialized medical care for people and their families living with serious illness.  It focuses on providing relief from the symptoms and stress of a serious illness.  The goal is to improve quality of life for both the patient and the family. Values and goals of care important to patient and family were attempted to be elicited.   Ms. Franko lives in the home with her only son, Hoover Browns and their dog. She is retired from [employer] where she worked at [employer] for many years.   Is ambulatory in the home with standby assist. She has a cane and walker however does not use. Her son reports he tries to be in her presence when she is mobile due to gait instability. Appetite is good although some decrease over the past few months. Able to perform most ADLs independently with occasional assistance.   We discussed her current illness and what it means in the larger context of Her on-going co-morbidities. Natural disease trajectory and expectations were discussed. They had a visit with Dr. Mickeal Skinner on today.   Son shares they plan to further discuss options and determine next steps. In the mean time will continue to take things day by day.   I approached discussions regarding code status and  advance directives. Patient and son confirms she does have a documented advanced directive naming her son, Legrand Como as her HCPOA. She is requesting to remain a full code at this time however I encouraged ongoing discussions. Ms. Wooton was clear she would not want any forms of artificial feedings or to have long-term life-sustaining measures. She would be ok with heroic measures if she would have good chances of survival or recovery however if no meaningful chance she would not want to be placed on life-sustaining machines.   I discussed the importance of continued conversation with family and their medical providers regarding overall plan of care and treatment options, ensuring decisions are within the context of the patients values and GOCs.  PLAN:  Established therapeutic relationship. Education provided on palliative's role in collaboration with her Oncology medical team.  Ongoing goals of care discussions.  Patient has an advanced directive naming her son, Legrand Como as her HCPOA. No life-sustaining measures if no chance of survival or meaningful recovery. Her wish would be then to have a natural death. No artificial feedings.  No symptom management needs at this time.  I will plan to see patient back in 2-4 weeks in collaboration to other oncology appointments.    Patient expressed understanding and was in agreement with this plan. She also understands that She can call the clinic at any time with any questions, concerns, or complaints.   Thank you for your referral and allowing Palliative to assist in Mrs. Thurston care.   Number and complexity of problems addressed: 1 HIGH - 1 or more chronic illnesses with SEVERE exacerbation, progression, or side effects of treatment - advanced cancer, pain. Any controlled substances utilized were prescribed in the context of palliative care.  Time Total: 50 min   Visit consisted of counseling and education dealing with the complex and emotionally  intense issues of symptom management and palliative care in the setting of serious and potentially life-threatening illness.Greater than 50%  of this time was spent counseling and coordinating care related to the above assessment and plan.  Signed by: Alda Lea, AGPCNP-BC Palliative Medicine Team/[facility] [place]

## 2022-02-18 NOTE — Progress Notes (Signed)
Kathryn [name] at [place], Kathryn [name] [address] [phone]   Interval Evaluation  Date of Service: 02/18/22 Patient Name: Kathryn [name] Patient MRN: [id number] Patient DOB: 05/02/1931 Provider: Ventura Sellers, MD  Identifying Statement:  Kathryn [name] is a 88 y.o. female with left frontal  mass    Oncologic History: 01/05/22: Presents with confusion, MRI demonstrates enhancing left frontal mass 02/16/22: Completes 3 weeks IMRT without chemo Lisbeth Renshaw)  Biomarkers:  MGMT Unknown.  IDH 1/2 Unknown.  EGFR Unknown  TERT Unknown   Interval History: Kathryn [name] presents today for follow up, now having completed 3 weeks of IMRT.  She tolerated treatment well overall, no specific complaints apart from difficulty with travel.  Son does describe notable drop off in short term memory, she is forgetting conversations more easily.  She is also more tired and less active than before.  Mainly sits in her chair and watches television.  She continues to dress herself, fix herself toast, toilet independently.  Meeting with palliative care later today.  H+P (01/11/22) Patient presented to medical attention last week with several weeks history of confusion, aberrant behavior.  Son, who lives with her full time, noticed impairment in memory, leaving stove running, forgetting how to open car door, etc.  In recent days she has been off balance and falling to the right side.  CNS imaging in the hospital demonstrated an enhancing left frontal mass, most likely c/w primary brain tumor.  Since discharge she has no new complaints, remains on decadron 90m twice per day.  Retired from [employer]  Medications: Current Outpatient Medications on File Prior to Visit  Medication Sig Dispense Refill   Ascorbic Acid (VITAMIN C) 1000 MG tablet Take 1,000 mg by mouth daily.     Biotin 10 MG TABS Take 10 mg by mouth daily.     cetirizine (ZYRTEC) 10 MG tablet TAKE 1 TABLET  DAILY (Patient not taking: Reported on 01/21/2022) 90 tablet 3   Cyanocobalamin (B-12) 2500 MCG TABS Take 2,500 mcg by mouth daily.     dexamethasone (DECADRON) 4 MG tablet Take 1 tablet (4 mg total) by mouth every 12 (twelve) hours. 60 tablet 0   zinc gluconate 50 MG tablet Take 50 mg by mouth daily.     No current facility-administered medications on file prior to visit.    Allergies:  Allergies  Allergen Reactions   Macrobid [Nitrofurantoin Monohyd Macro]     Made pt sick to per stomach and shaky   Past Medical History:  Past Medical History:  Diagnosis Date   Allergy    Arthritis    Colon polyps    History of blood transfusion    with previous surgeries   Hyperlipidemia    Irregular heart rate    PONV (postoperative nausea and vomiting)    hx of pt. states she thinks was from pain med.   Past Surgical History:  Past Surgical History:  Procedure Laterality Date   CARDIAC CATHETERIZATION     HAND SURGERY Left    TOTAL HIP ARTHROPLASTY  2007   LEFT   TOTAL HIP ARTHROPLASTY Right 10/13/2016   Procedure: RIGHT TOTAL HIP ARTHROPLASTY ANTERIOR APPROACH;  Surgeon: FGaynelle Arabian MD;  Location: [facility];  Service: Orthopedics;  Laterality: Right;   TOTAL KNEE ARTHROPLASTY     Right    TOTAL KNEE ARTHROPLASTY Left 11/24/2020   Procedure: TOTAL KNEE ARTHROPLASTY;  Surgeon: AGaynelle Arabian MD;  [facility];  Service: Orthopedics;  Laterality: Left;  22mn   TUBAL LIGATION     Social History:  Social History   Socioeconomic History   Marital status: Widowed    Spouse name: Not on file   Number of children: 1   Years of education: Not on file   Highest education level: Not on file  Occupational History   Occupation: SChartered loss adjuster   Comment: Works at [employer]   Smoking status: Never   Smokeless tobacco: Never  Vaping Use   Vaping Use: Never used  Substance and Sexual Activity   Alcohol use: Yes    Alcohol/week: 3.0 standard drinks    Types: 3  Standard drinks or equivalent per week    Comment: Occassionally   Drug use: No   Sexual activity: Never    Birth control/protection: Post-menopausal, Surgical  Other Topics Concern   Not on file  Social History Narrative   Regular exercise-- yes, 3-4 times a week      Diet: limited water, some fruit and veggies      Full code, has living will, HCPOA, son [name]( reviewed 2015)   Social Determinants of Health   Financial Resource Strain: Low Risk    Difficulty of Paying Living Expenses: Not very hard  Food Insecurity: No Food Insecurity   Worried About RCharity fundraiserin the Last Year: Never true   Ran Out of Food in the Last Year: Never true  Transportation Needs: No Transportation Needs   Lack of Transportation (Medical): No   Lack of Transportation (Non-Medical): No  Physical Activity: Insufficiently Active   Days of Exercise per Week: 3 days   Minutes of Exercise per Session: 30 min  Stress: No Stress Concern Present   Feeling of Stress : Not at all  Social Connections: Socially Isolated   Frequency of Communication with Friends and Family: Twice a week   Frequency of Social Gatherings with Friends and Family: Twice a week   Attends Religious Services: Never   AMarine scientistor Organizations: No   Attends CArchivistMeetings: Never   Marital Status: Widowed  IHuman resources officerViolence: Not At Risk   Fear of Current or Ex-Partner: No   Emotionally Abused: No   Physically Abused: No   Sexually Abused: No   Family History:  Family History  Problem Relation Age of Onset   Pancreatic cancer Mother    Hypertension Sister    Heart disease Sister    Breast cancer Sister        Age 86's  Heart attack Brother    Heart disease Brother    Hypertension Brother    Bone cancer Sister    Heart disease Brother    Colon cancer Neg Hx    Colon polyps Neg Hx    Esophageal cancer Neg Hx    Diabetes Neg Hx    Kidney disease Neg Hx    Gallbladder  disease Neg Hx     Review of Systems: Constitutional: Doesn't report fevers, chills or abnormal weight loss Eyes: Doesn't report blurriness of vision Ears, nose, mouth, throat, and face: Doesn't report sore throat Respiratory: Doesn't report cough, dyspnea or wheezes Cardiovascular: Doesn't report palpitation, chest discomfort  Gastrointestinal:  Doesn't report nausea, constipation, diarrhea GU: Doesn't report incontinence Skin: Doesn't report skin rashes Neurological: Per HPI Musculoskeletal: Doesn't report joint pain Behavioral/Psych: Doesn't report anxiety  Physical Exam: Vitals:   02/18/22 0845  BP:  130/71  Pulse: 61  Resp: 18  Temp: (!) 97.5 F (36.4 C)  SpO2: 92%   KPS: 70. General: Alert, cooperative, pleasant, in no acute distress Head: Normal EENT: No conjunctival injection or scleral icterus.  Lungs: Resp effort normal Cardiac: Regular rate Abdomen: Non-distended abdomen Skin: No rashes cyanosis or petechiae. Extremities: No clubbing or edema  Neurologic Exam: Mental Status: Awake, alert, attentive to examiner. Oriented to self and environment. Language is fluent with intact comprehension.  Impaired insight, impaired short term recall. Cranial Nerves: Visual acuity is grossly normal. Visual fields are full. Extra-ocular movements intact. No ptosis. Face is symmetric Motor: Tone and bulk are normal. Power is full in both arms and legs. Reflexes are symmetric, no pathologic reflexes present.  Sensory: Intact to light touch Gait: Dystaxic   Labs: I have reviewed the data as listed    Component Value Date/Time   NA 135 01/08/2022 0435   K 4.0 01/08/2022 0435   CL 107 01/08/2022 0435   CO2 22 01/08/2022 0435   GLUCOSE 123 (H) 01/08/2022 0435   BUN 33 (H) 01/08/2022 0435   CREATININE 0.67 01/08/2022 0435   CREATININE 0.66 09/28/2016 1007   CALCIUM 9.0 01/08/2022 0435   PROT 6.7 01/06/2022 0418   ALBUMIN 2.8 (L) 01/07/2022 0305   AST 14 (L) 01/06/2022  0418   ALT 15 01/06/2022 0418   ALKPHOS 62 01/06/2022 0418   BILITOT 0.6 01/06/2022 0418   GFRNONAA >60 01/08/2022 0435   GFRAA >60 10/15/2016 0426   Lab Results  Component Value Date   WBC 12.2 (H) 01/08/2022   NEUTROABS 6.5 01/05/2022   HGB 10.5 (L) 01/08/2022   HCT 31.1 (L) 01/08/2022   MCV 97.2 01/08/2022   PLT 295 01/08/2022    Assessment/Plan Brain mass  We appreciate the opportunity to participate in the care of Capitola B Weinmann.  She presents with clinical and radiographic syndrome consistent with left frontal primary brain neoplasm.  Likely etiology is high grade glioma, glioblastoma.    She tolerated 3 weeks course of radiation well overall.  She does have some decline in cognitive function, possibly related to effects of treatment.  Motor function is mostly stable.  We recommended obtaining an MRI brain in 1-2 weeks to evaluate.  We could consider avastin, she is likely not a good candidate for cytotoxic chemo.  She is agreeable with this.  Decadron may continue 9m daily for now.  She will continue with PT exercises.    She will meet with palliative care later this morning.  PEtheleen SiaRegan and her son will continue the goals of care discussion at home, and let uKoreaknow what their preferred path forward is.  We are happy to continue the discussion in person or via phone if there are additional questions.  All questions were answered. The patient knows to call the clinic with any problems, questions or concerns. No barriers to learning were detected.  The total time spent in the encounter was 30 minutes and more than 50% was on counseling and review of test results   ZVentura Sellers MD Medical Director of Neuro-Oncology [facility]at [place]05/25/23 8:55 AM

## 2022-02-18 NOTE — Telephone Encounter (Signed)
Per 5/25 los called and poke to pt son about appointment in June  Son confirmed appointment

## 2022-02-18 NOTE — Progress Notes (Signed)
                                                                                                                                                            Patient Name: Kathryn [name] MRN: [id number] DOB: [birth year] Referring Physician: Cecil Cobbs Date of Service: 02/16/2022 [facility], [place]                                                        End Of Treatment Note  Diagnoses: C71.1-Malignant neoplasm of frontal lobe  Cancer Staging:   Putative Glioblastoma of the left frontal lobe  Intent: Curative  Radiation Treatment Dates: 01/27/2022 through 02/16/2022 Site Technique Total Dose (Gy) Dose per Fx (Gy) Completed Fx Beam Energies  Brain: Brain IMRT 40.05/40.05 2.67 15/15 6X   Narrative: The patient tolerated radiation therapy relatively well. She developed fatigue.  Plan: The patient will receive a call in about one month from the radiation oncology department. She will continue follow up with Dr. Mickeal Skinner as well.   ________________________________________________    Carola Rhine, [facility]

## 2022-02-25 ENCOUNTER — Ambulatory Visit (HOSPITAL_BASED_OUTPATIENT_CLINIC_OR_DEPARTMENT_OTHER)
Admission: RE | Admit: 2022-02-25 | Discharge: 2022-02-25 | Disposition: A | Discharge status: Home / Self Care | Payer: PPO | Source: Ambulatory Visit | Attending: Internal Medicine | Admitting: Internal Medicine

## 2022-02-25 DIAGNOSIS — D496 Neoplasm of unspecified behavior of brain: Secondary | ICD-10-CM | POA: Diagnosis not present

## 2022-02-25 DIAGNOSIS — G9389 Other specified disorders of brain: Secondary | ICD-10-CM | POA: Insufficient documentation

## 2022-02-25 DIAGNOSIS — M6281 Muscle weakness (generalized): Secondary | ICD-10-CM | POA: Diagnosis not present

## 2022-02-25 DIAGNOSIS — R2681 Unsteadiness on feet: Secondary | ICD-10-CM | POA: Diagnosis not present

## 2022-02-25 DIAGNOSIS — D539 Nutritional anemia, unspecified: Secondary | ICD-10-CM | POA: Diagnosis not present

## 2022-02-25 DIAGNOSIS — R4182 Altered mental status, unspecified: Secondary | ICD-10-CM | POA: Diagnosis not present

## 2022-02-25 DIAGNOSIS — N390 Urinary tract infection, site not specified: Secondary | ICD-10-CM | POA: Diagnosis not present

## 2022-02-25 MED ORDER — GADOBUTROL 1 MMOL/ML IV SOLN
5.3000 mL | Freq: Once | INTRAVENOUS | Status: AC | PRN
  Administered 2022-02-25: 5.3 mL via INTRAVENOUS
  Filled 2022-02-25: qty 6

## 2022-02-26 ENCOUNTER — Other Ambulatory Visit: Payer: Self-pay | Admitting: Internal Medicine

## 2022-02-26 DIAGNOSIS — G9389 Other specified disorders of brain: Secondary | ICD-10-CM

## 2022-03-01 ENCOUNTER — Inpatient Hospital Stay: Payer: PPO | Attending: Neurological Surgery

## 2022-03-01 ENCOUNTER — Inpatient Hospital Stay (HOSPITAL_BASED_OUTPATIENT_CLINIC_OR_DEPARTMENT_OTHER): Payer: PPO | Admitting: Internal Medicine

## 2022-03-01 ENCOUNTER — Other Ambulatory Visit: Payer: Self-pay

## 2022-03-01 ENCOUNTER — Inpatient Hospital Stay: Payer: PPO | Admitting: Nurse Practitioner

## 2022-03-01 VITALS — BP 100/69 | HR 90 | Resp 18 | Wt 111.2 lb

## 2022-03-01 DIAGNOSIS — G9389 Other specified disorders of brain: Secondary | ICD-10-CM

## 2022-03-01 DIAGNOSIS — D496 Neoplasm of unspecified behavior of brain: Secondary | ICD-10-CM | POA: Diagnosis not present

## 2022-03-01 DIAGNOSIS — Z7952 Long term (current) use of systemic steroids: Secondary | ICD-10-CM | POA: Diagnosis not present

## 2022-03-01 LAB — CBC WITH DIFFERENTIAL (CANCER CENTER ONLY)
Abs Immature Granulocytes: 0.09 10*3/uL — ABNORMAL HIGH (ref 0.00–0.07)
Basophils Absolute: 0.1 10*3/uL (ref 0.0–0.1)
Basophils Relative: 1 %
Eosinophils Absolute: 0.1 10*3/uL (ref 0.0–0.5)
Eosinophils Relative: 1 %
HCT: 40.1 % (ref 36.0–46.0)
Hemoglobin: 13.3 g/dL (ref 12.0–15.0)
Immature Granulocytes: 1 %
Lymphocytes Relative: 4 %
Lymphs Abs: 0.4 10*3/uL — ABNORMAL LOW (ref 0.7–4.0)
MCH: 33.3 pg (ref 26.0–34.0)
MCHC: 33.2 g/dL (ref 30.0–36.0)
MCV: 100.3 fL — ABNORMAL HIGH (ref 80.0–100.0)
Monocytes Absolute: 0.5 10*3/uL (ref 0.1–1.0)
Monocytes Relative: 5 %
Neutro Abs: 8.3 10*3/uL — ABNORMAL HIGH (ref 1.7–7.7)
Neutrophils Relative %: 88 %
Platelet Count: 214 10*3/uL (ref 150–400)
RBC: 4 MIL/uL (ref 3.87–5.11)
RDW: 14.6 % (ref 11.5–15.5)
WBC Count: 9.4 10*3/uL (ref 4.0–10.5)
nRBC: 0 % (ref 0.0–0.2)

## 2022-03-01 LAB — CMP (CANCER CENTER ONLY)
ALT: 14 U/L (ref 0–44)
AST: 16 U/L (ref 15–41)
Albumin: 3.7 g/dL (ref 3.5–5.0)
Alkaline Phosphatase: 59 U/L (ref 38–126)
Anion gap: 8 (ref 5–15)
BUN: 38 mg/dL — ABNORMAL HIGH (ref 8–23)
CO2: 25 mmol/L (ref 22–32)
Calcium: 9.6 mg/dL (ref 8.9–10.3)
Chloride: 105 mmol/L (ref 98–111)
Creatinine: 0.78 mg/dL (ref 0.44–1.00)
GFR, Estimated: >60 mL/min (ref >60)
Glucose, Bld: 140 mg/dL — ABNORMAL HIGH (ref 70–99)
Potassium: 3.9 mmol/L (ref 3.5–5.1)
Sodium: 138 mmol/L (ref 135–145)
Total Bilirubin: 0.9 mg/dL (ref 0.3–1.2)
Total Protein: 6.5 g/dL (ref 6.5–8.1)

## 2022-03-01 NOTE — Progress Notes (Signed)
[facility] at [place], [place] [address] [phone]   Interval Evaluation  Date of Service: 03/01/22 Patient Name: Kathryn [name] Patient MRN: [id number] Patient DOB: November 27, 1930 Provider: Ventura Sellers, MD  Identifying Statement:  Kathryn [name] is a 88 y.o. female with left frontal  mass    Oncologic History: 01/05/22: Presents with confusion, MRI demonstrates enhancing left frontal mass 02/16/22: Completes 3 weeks IMRT without chemo Kathryn [name])  Biomarkers:  MGMT Unknown.  IDH 1/2 Unknown.  EGFR Unknown  TERT Unknown   Interval History: Kathryn [name] presents today for follow up after recent MRI brain.  She continues to decline overall with regards to memory, cognition.  She is also more tired and less active than before.  Still mainly sits in her chair and watches television.  Currently requiring full time supervision, mostly from her son.  Continues on decadron 63m twice per day.  H+P (01/11/22) Patient presented to medical attention last week with several weeks history of confusion, aberrant behavior.  Son, who lives with her full time, noticed impairment in memory, leaving stove running, forgetting how to open car door, etc.  In recent days she has been off balance and falling to the right side.  CNS imaging in the hospital demonstrated an enhancing left frontal mass, most likely c/w primary brain tumor.  Since discharge she has no new complaints, remains on decadron 443mtwice per day.  Retired from [employer] Medications: Current Outpatient Medications on File Prior to Visit  Medication Sig Dispense Refill   Ascorbic Acid (VITAMIN C) 1000 MG tablet Take 1,000 mg by mouth daily.     Biotin 10 MG TABS Take 10 mg by mouth daily.     cetirizine (ZYRTEC) 10 MG tablet TAKE 1 TABLET DAILY 90 tablet 3   Cyanocobalamin (B-12) 2500 MCG TABS Take 2,500 mcg by mouth daily.     dexamethasone (DECADRON) 4 MG tablet Take 1 tablet (4 mg  total) by mouth every 12 (twelve) hours. 60 tablet 0   zinc gluconate 50 MG tablet Take 50 mg by mouth daily.     No current facility-administered medications on file prior to visit.    Allergies:  Allergies  Allergen Reactions   Macrobid [Nitrofurantoin Monohyd Macro]     Made pt sick to per stomach and shaky   Past Medical History:  Past Medical History:  Diagnosis Date   Allergy    Arthritis    Colon polyps    History of blood transfusion    with previous surgeries   Hyperlipidemia    Irregular heart rate    PONV (postoperative nausea and vomiting)    hx of pt. states she thinks was from pain med.   Past Surgical History:  Past Surgical History:  Procedure Laterality Date   CARDIAC CATHETERIZATION     HAND SURGERY Left    TOTAL HIP ARTHROPLASTY  2007   LEFT   TOTAL HIP ARTHROPLASTY Right 10/13/2016   Procedure: RIGHT TOTAL HIP ARTHROPLASTY ANTERIOR APPROACH;  Surgeon: FrGaynelle ArabianMD;  Location: [facility];  Service: Orthopedics;  Laterality: Right;   TOTAL KNEE ARTHROPLASTY     Right    TOTAL KNEE ARTHROPLASTY Left 11/24/2020   Procedure: TOTAL KNEE ARTHROPLASTY;  Surgeon: AlGaynelle ArabianMD;  Location: [facility];  Service: Orthopedics;  Laterality: Left;  5035m  TUBAL LIGATION     Social History:  Social History   Socioeconomic History  Marital status: Widowed    Spouse name: Not on file   Number of children: 1   Years of education: Not on file   Highest education level: Not on file  Occupational History   Occupation: Chartered loss adjuster    Comment: Works at [employer]   Smoking status: Never   Smokeless tobacco: Never  Vaping Use   Vaping Use: Never used  Substance and Sexual Activity   Alcohol use: Yes    Alcohol/week: 3.0 standard drinks    Types: 3 Standard drinks or equivalent per week    Comment: Occassionally   Drug use: No   Sexual activity: Never    Birth control/protection: Post-menopausal, Surgical  Other Topics Concern   Not on  file  Social History Narrative   Regular exercise-- yes, 3-4 times a week      Diet: limited water, some fruit and veggies      Full code, has living will, HCPOA, son Kathryn [name] ( reviewed 2015)   Social Determinants of Health   Financial Resource Strain: Low Risk    Difficulty of Paying Living Expenses: Not very hard  Food Insecurity: No Food Insecurity   Worried About Charity fundraiser in the Last Year: Never true   Ran Out of Food in the Last Year: Never true  Transportation Needs: No Transportation Needs   Lack of Transportation (Medical): No   Lack of Transportation (Non-Medical): No  Physical Activity: Insufficiently Active   Days of Exercise per Week: 3 days   Minutes of Exercise per Session: 30 min  Stress: No Stress Concern Present   Feeling of Stress : Not at all  Social Connections: Socially Isolated   Frequency of Communication with Friends and Family: Twice a week   Frequency of Social Gatherings with Friends and Family: Twice a week   Attends Religious Services: Never   Marine scientist or Organizations: No   Attends Archivist Meetings: Never   Marital Status: Widowed  Human resources officer Violence: Not At Risk   Fear of Current or Ex-Partner: No   Emotionally Abused: No   Physically Abused: No   Sexually Abused: No   Family History:  Family History  Problem Relation Age of Onset   Pancreatic cancer Mother    Hypertension Sister    Heart disease Sister    Breast cancer Sister        Age 57's   Heart attack Brother    Heart disease Brother    Hypertension Brother    Bone cancer Sister    Heart disease Brother    Colon cancer Neg Hx    Colon polyps Neg Hx    Esophageal cancer Neg Hx    Diabetes Neg Hx    Kidney disease Neg Hx    Gallbladder disease Neg Hx     Review of Systems: Constitutional: Doesn't report fevers, chills or abnormal weight loss Eyes: Doesn't report blurriness of vision Ears, nose, mouth, throat, and face:  Doesn't report sore throat Respiratory: Doesn't report cough, dyspnea or wheezes Cardiovascular: Doesn't report palpitation, chest discomfort  Gastrointestinal:  Doesn't report nausea, constipation, diarrhea GU: Doesn't report incontinence Skin: Doesn't report skin rashes Neurological: Per HPI Musculoskeletal: Doesn't report joint pain Behavioral/Psych: Doesn't report anxiety  Physical Exam: Vitals:   03/01/22 1108  BP: 100/69  Pulse: 90  Resp: 18  SpO2: 97%    KPS: 60 General: Wheelchair bound Head: Normal EENT: No conjunctival injection or scleral icterus.  Lungs: Resp effort normal Cardiac: Regular rate Abdomen: Non-distended abdomen Skin: No rashes cyanosis or petechiae. Extremities: No clubbing or edema  Neurologic Exam: Mental Status: Awake, alert, attentive to examiner. Oriented to self and environment. Language is fluent with intact comprehension.  Impaired insight, impaired short term recall. Cranial Nerves: Visual acuity is grossly normal. Visual fields are full. Extra-ocular movements intact. No ptosis. Face is symmetric Motor: Tone and bulk are normal. Power is full in both arms and legs. Reflexes are symmetric, no pathologic reflexes present.  Sensory: Intact to light touch Gait: Dystaxic   Labs: I have reviewed the data as listed    Component Value Date/Time   NA 135 01/08/2022 0435   K 4.0 01/08/2022 0435   CL 107 01/08/2022 0435   CO2 22 01/08/2022 0435   GLUCOSE 123 (H) 01/08/2022 0435   BUN 33 (H) 01/08/2022 0435   CREATININE 0.67 01/08/2022 0435   CREATININE 0.66 09/28/2016 1007   CALCIUM 9.0 01/08/2022 0435   PROT 6.7 01/06/2022 0418   ALBUMIN 2.8 (L) 01/07/2022 0305   AST 14 (L) 01/06/2022 0418   ALT 15 01/06/2022 0418   ALKPHOS 62 01/06/2022 0418   BILITOT 0.6 01/06/2022 0418   GFRNONAA >60 01/08/2022 0435   GFRAA >60 10/15/2016 0426   Lab Results  Component Value Date   WBC 12.2 (H) 01/08/2022   NEUTROABS 6.5 01/05/2022   HGB 10.5  (L) 01/08/2022   HCT 31.1 (L) 01/08/2022   MCV 97.2 01/08/2022   PLT 295 01/08/2022   Imaging:  [place] Clinician Interpretation: I have personally reviewed the CNS images as listed.  My interpretation, in the context of the patient's clinical presentation, is progressive disease  MR BRAIN W WO CONTRAST  Result Date: 02/26/2022 CLINICAL DATA:  88 year old female with altered mental status in April, found to have left frontal primary appearing brain tumor on MRI that month, presumed high-grade glioma. Now status post 3 weeks of IMRT without chemotherapy. Restaging. EXAM: MRI HEAD WITHOUT AND WITH CONTRAST TECHNIQUE: Multiplanar, multiecho pulse sequences of the brain and surrounding structures were obtained without and with intravenous contrast. CONTRAST:  5.87m GADAVIST GADOBUTROL 1 MMOL/ML IV SOLN COMPARISON:  Brain MRI 01/05/2022. FINDINGS: Brain: Large, heterogeneously enhancing intra-axial appearing tumor centered at the left inferior frontal gyrus now in compass is 55 x 54 x 38 mm (AP by transverse by CC). Previously the enhancing component with more indistinct margins was roughly 50 by 50 by 39 mm. Confluent surrounding masslike T2 and FLAIR hyperintensity persists, and expands the genu of the corpus callosum as before. Regional mass effect including rightward midline shift of 13 mm measured on coronal images has not significantly changed (series 11, image 18 compared series 10, image 18 previously). Heterogeneous diffusion and hemosiderin within the mass has not significantly changed. There is stable mild pachymeningeal thickening along the anterior left frontal convexity (series 12, image 79) which is inseparable from the anterior inferior margin of the tumor. Stable mass effect on the anterolateral ventricles with no ventriculomegaly or ependymal enhancement. No new areas of parenchymal enhancement or tumoral edema. Stable basilar cisterns, with mildly effaced suprasellar cistern. No superimposed  restricted diffusion suggestive of acute infarction. No extra-axial collection or acute intracranial hemorrhage. Cervicomedullary junction and pituitary are within normal limits. No significant background cerebral encephalomalacia or chronic ischemia identified. Vascular: Major intracranial vascular flow voids are stable. The major dural venous sinuses are enhancing and appear to be patent. Skull and upper cervical spine: Visualized bone marrow signal is  within normal limits. Negative for age visible cervical spine. Sinuses/Orbits: Postoperative changes to the globes. Paranasal sinuses are stable and generally well aerated. Other: Trace mastoid air cell fluid is stable. Negative visible nasopharynx. Negative visible scalp and face. IMPRESSION: 1. Mildly enlarged heterogeneously enhancing component of the anterior left frontal lobe tumor, now 55 x 54 x 38 mm versus approximately 50 x 50 x 39 mm in April. Presumed Glioblastoma, with unchanged confluent regional tumoral edema which expands the genu of the corpus callosum. Stable regional mass effect and midline shift. 2. No new intracranial abnormality identified. Electronically Signed   By: Genevie Ann M.D.   On: 02/26/2022 04:59    Assessment/Plan Neoplasm of brain causing mass effect on adjacent structures ([place])  Kathryn [name] is clinically progressive today, with decline in cognition and overall functional status.  MRI brain demonstrates progression of disease with regards to enhancing tumor volume.   We reviewed goals of care in detail at bedside today with Kathryn [name] and her son.  Due to advanced age, poor functional status, tumor size and progression, we first recommended transition to hospice.  They did meet with palliative care last visit to review hospice services. If they want to still pursue oncologic treatment, we could consider utilizing avastin monotherapy 58m/kg IV q2 weeks. We reviewed side effects including wound healing impairment,  bleeding/clotting events.  She is not good candidate for Temodar or other cytotoxic chemotherapy at this time.  Patient is leaning towards palliative only interventions at this time, but would like to take another day or two to think it over.   PEtheleen SiaRegan and her son will continue the goals of care discussion at home, and let uKoreaknow what their preferred path forward is.  We are happy to continue the discussion in person or via phone if there are additional questions.  Decadron may con't [id number].  All questions were answered. The patient knows to call the clinic with any problems, questions or concerns. No barriers to learning were detected.  The total time spent in the encounter was 30 minutes and more than 50% was on counseling and review of test results   ZaVentura SellersMD Medical Director of Neuro-Oncology [facility] We[place]6/05/23 10:50 AM

## 2022-03-08 ENCOUNTER — Other Ambulatory Visit: Payer: Self-pay | Admitting: *Deleted

## 2022-03-08 DIAGNOSIS — G9389 Other specified disorders of brain: Secondary | ICD-10-CM

## 2022-03-08 NOTE — Progress Notes (Signed)
Son called to ask for referral to Hospice instead of proceeding with Avastin treatment.    Called referral to [facility]).

## 2022-03-22 ENCOUNTER — Inpatient Hospital Stay: Payer: PPO | Admitting: Nurse Practitioner

## 2022-04-05 ENCOUNTER — Ambulatory Visit
Admission: RE | Admit: 2022-04-05 | Discharge: 2022-04-05 | Disposition: A | Discharge status: Home / Self Care | Payer: PPO | Source: Ambulatory Visit | Attending: Family Medicine | Admitting: Family Medicine

## 2022-04-05 DIAGNOSIS — C711 Malignant neoplasm of frontal lobe: Secondary | ICD-10-CM

## 2022-04-05 NOTE — Progress Notes (Signed)
  Radiation Oncology         [phone] ________________________________  Name: Kathryn [name] MRN: [id number]  Date of Service: 04/05/2022  DOB: 1930-11-06  Post Treatment Telephone Note  Diagnosis:   Putative Glioblastoma of the left frontal lobe  Intent: Curative  Radiation Treatment Dates: 01/27/2022 through 02/16/2022 Site Technique Total Dose (Gy) Dose per Fx (Gy) Completed Fx Beam Energies  Brain: Brain IMRT 40.05/40.05 2.67 15/15 6X   Narrative: The patient tolerated radiation therapy relatively well. She developed fatigue.   Impression/Plan: 1. Putative Glioblastoma of the left frontal lobe. The patient was not contacted as she's been referred to Hospice.      Carola Rhine, PAC

## 2022-04-13 ENCOUNTER — Telehealth: Payer: Self-pay | Admitting: *Deleted

## 2022-04-13 NOTE — Telephone Encounter (Signed)
Received call from [facility] with [facility].  Patient passed away at hospice house on April 28, 2022 @ [id number].  No family was present at bedside.  Routing to provider to proceed with death certificate.

## 2022-04-27 DEATH — deceased
